# Patient Record
Sex: Female | Born: 1935 | Race: White | Hispanic: No | Smoking: Former smoker
Health system: Southern US, Community
[De-identification: ages and names within clinical notes are randomized; demographics above are authoritative.]

## PROBLEM LIST (undated history)

## (undated) DIAGNOSIS — Z860101 Personal history of adenomatous and serrated colon polyps: Secondary | ICD-10-CM

## (undated) DIAGNOSIS — Z974 Presence of external hearing-aid: Secondary | ICD-10-CM

## (undated) DIAGNOSIS — Z853 Personal history of malignant neoplasm of breast: Secondary | ICD-10-CM

## (undated) DIAGNOSIS — K449 Diaphragmatic hernia without obstruction or gangrene: Secondary | ICD-10-CM

## (undated) DIAGNOSIS — Z9889 Other specified postprocedural states: Secondary | ICD-10-CM

## (undated) DIAGNOSIS — R112 Nausea with vomiting, unspecified: Secondary | ICD-10-CM

## (undated) DIAGNOSIS — Z803 Family history of malignant neoplasm of breast: Secondary | ICD-10-CM

## (undated) DIAGNOSIS — Z800 Family history of malignant neoplasm of digestive organs: Secondary | ICD-10-CM

## (undated) DIAGNOSIS — C50919 Malignant neoplasm of unspecified site of unspecified female breast: Secondary | ICD-10-CM

## (undated) DIAGNOSIS — M1711 Unilateral primary osteoarthritis, right knee: Secondary | ICD-10-CM

## (undated) DIAGNOSIS — K219 Gastro-esophageal reflux disease without esophagitis: Secondary | ICD-10-CM

## (undated) DIAGNOSIS — N819 Female genital prolapse, unspecified: Secondary | ICD-10-CM

## (undated) DIAGNOSIS — Z86010 Personal history of colonic polyps: Secondary | ICD-10-CM

## (undated) DIAGNOSIS — C801 Malignant (primary) neoplasm, unspecified: Secondary | ICD-10-CM

## (undated) DIAGNOSIS — Z8489 Family history of other specified conditions: Secondary | ICD-10-CM

## (undated) DIAGNOSIS — Z9221 Personal history of antineoplastic chemotherapy: Secondary | ICD-10-CM

## (undated) DIAGNOSIS — Z973 Presence of spectacles and contact lenses: Secondary | ICD-10-CM

## (undated) DIAGNOSIS — S32030A Wedge compression fracture of third lumbar vertebra, initial encounter for closed fracture: Secondary | ICD-10-CM

## (undated) DIAGNOSIS — R3989 Other symptoms and signs involving the genitourinary system: Secondary | ICD-10-CM

## (undated) DIAGNOSIS — E785 Hyperlipidemia, unspecified: Secondary | ICD-10-CM

## (undated) DIAGNOSIS — H8110 Benign paroxysmal vertigo, unspecified ear: Secondary | ICD-10-CM

## (undated) HISTORY — DX: Unilateral primary osteoarthritis, right knee: M17.11

## (undated) HISTORY — DX: Family history of malignant neoplasm of digestive organs: Z80.0

## (undated) HISTORY — DX: Malignant neoplasm of unspecified site of unspecified female breast: C50.919

## (undated) HISTORY — PX: COLONOSCOPY: SHX174

## (undated) HISTORY — DX: Family history of malignant neoplasm of breast: Z80.3

## (undated) HISTORY — DX: Gastro-esophageal reflux disease without esophagitis: K21.9

## (undated) HISTORY — DX: Hyperlipidemia, unspecified: E78.5

## (undated) HISTORY — DX: Wedge compression fracture of third lumbar vertebra, initial encounter for closed fracture: S32.030A

---

## 1983-08-09 HISTORY — PX: CHOLECYSTECTOMY: SHX55

## 1990-08-08 DIAGNOSIS — Z9221 Personal history of antineoplastic chemotherapy: Secondary | ICD-10-CM | POA: Insufficient documentation

## 1990-08-08 HISTORY — DX: Personal history of antineoplastic chemotherapy: Z92.21

## 1990-08-08 HISTORY — PX: MASTECTOMY: SHX3

## 1999-12-29 ENCOUNTER — Other Ambulatory Visit: Admission: RE | Admit: 1999-12-29 | Discharge: 1999-12-29 | Payer: Self-pay | Admitting: Obstetrics and Gynecology

## 2000-01-10 ENCOUNTER — Inpatient Hospital Stay (HOSPITAL_COMMUNITY): Admission: RE | Admit: 2000-01-10 | Discharge: 2000-01-12 | Payer: Self-pay | Admitting: Obstetrics and Gynecology

## 2000-01-10 ENCOUNTER — Encounter (INDEPENDENT_AMBULATORY_CARE_PROVIDER_SITE_OTHER): Payer: Self-pay

## 2000-01-10 HISTORY — PX: LAPAROSCOPIC ASSISTED VAGINAL HYSTERECTOMY: SHX5398

## 2000-09-06 ENCOUNTER — Other Ambulatory Visit: Admission: RE | Admit: 2000-09-06 | Discharge: 2000-09-06 | Payer: Self-pay | Admitting: Obstetrics and Gynecology

## 2000-11-22 ENCOUNTER — Ambulatory Visit (HOSPITAL_COMMUNITY): Admission: RE | Admit: 2000-11-22 | Discharge: 2000-11-22 | Payer: Self-pay | Admitting: Oncology

## 2000-11-22 ENCOUNTER — Encounter (HOSPITAL_COMMUNITY): Payer: Self-pay | Admitting: Oncology

## 2001-01-05 ENCOUNTER — Encounter (HOSPITAL_COMMUNITY): Admission: RE | Admit: 2001-01-05 | Discharge: 2001-02-04 | Payer: Self-pay | Admitting: Oncology

## 2001-01-05 ENCOUNTER — Encounter: Admission: RE | Admit: 2001-01-05 | Discharge: 2001-01-05 | Payer: Self-pay | Admitting: Oncology

## 2001-10-10 ENCOUNTER — Other Ambulatory Visit: Admission: RE | Admit: 2001-10-10 | Discharge: 2001-10-10 | Payer: Self-pay | Admitting: Obstetrics and Gynecology

## 2001-11-22 ENCOUNTER — Ambulatory Visit (HOSPITAL_COMMUNITY): Admission: RE | Admit: 2001-11-22 | Discharge: 2001-11-22 | Payer: Self-pay | Admitting: Oncology

## 2001-11-22 ENCOUNTER — Encounter (HOSPITAL_COMMUNITY): Payer: Self-pay | Admitting: Oncology

## 2002-01-09 ENCOUNTER — Encounter (HOSPITAL_COMMUNITY): Admission: RE | Admit: 2002-01-09 | Discharge: 2002-02-08 | Payer: Self-pay | Admitting: Oncology

## 2002-01-09 ENCOUNTER — Encounter: Admission: RE | Admit: 2002-01-09 | Discharge: 2002-01-09 | Payer: Self-pay | Admitting: Oncology

## 2002-10-22 ENCOUNTER — Other Ambulatory Visit: Admission: RE | Admit: 2002-10-22 | Discharge: 2002-10-22 | Payer: Self-pay | Admitting: Obstetrics and Gynecology

## 2002-12-13 ENCOUNTER — Encounter (HOSPITAL_COMMUNITY): Payer: Self-pay | Admitting: Oncology

## 2002-12-13 ENCOUNTER — Ambulatory Visit (HOSPITAL_COMMUNITY): Admission: RE | Admit: 2002-12-13 | Discharge: 2002-12-13 | Payer: Self-pay | Admitting: Oncology

## 2003-01-10 ENCOUNTER — Encounter (HOSPITAL_COMMUNITY): Admission: RE | Admit: 2003-01-10 | Discharge: 2003-02-09 | Payer: Self-pay | Admitting: Oncology

## 2003-01-10 ENCOUNTER — Encounter: Admission: RE | Admit: 2003-01-10 | Discharge: 2003-01-10 | Payer: Self-pay | Admitting: Oncology

## 2003-12-17 ENCOUNTER — Encounter (HOSPITAL_COMMUNITY): Admission: RE | Admit: 2003-12-17 | Discharge: 2004-01-16 | Payer: Self-pay | Admitting: Oncology

## 2004-01-07 ENCOUNTER — Other Ambulatory Visit: Admission: RE | Admit: 2004-01-07 | Discharge: 2004-01-07 | Payer: Self-pay | Admitting: Obstetrics and Gynecology

## 2004-02-03 ENCOUNTER — Encounter: Admission: RE | Admit: 2004-02-03 | Discharge: 2004-02-03 | Payer: Self-pay | Admitting: Oncology

## 2004-02-03 ENCOUNTER — Encounter (HOSPITAL_COMMUNITY): Admission: RE | Admit: 2004-02-03 | Discharge: 2004-03-04 | Payer: Self-pay | Admitting: Oncology

## 2005-01-10 ENCOUNTER — Encounter: Admission: RE | Admit: 2005-01-10 | Discharge: 2005-01-10 | Payer: Self-pay | Admitting: Oncology

## 2005-01-10 ENCOUNTER — Encounter (HOSPITAL_COMMUNITY): Admission: RE | Admit: 2005-01-10 | Discharge: 2005-02-09 | Payer: Self-pay | Admitting: Oncology

## 2005-01-20 ENCOUNTER — Other Ambulatory Visit: Admission: RE | Admit: 2005-01-20 | Discharge: 2005-01-20 | Payer: Self-pay | Admitting: Obstetrics and Gynecology

## 2005-02-25 ENCOUNTER — Encounter: Admission: RE | Admit: 2005-02-25 | Discharge: 2005-02-25 | Payer: Self-pay | Admitting: Oncology

## 2005-02-25 ENCOUNTER — Ambulatory Visit (HOSPITAL_COMMUNITY): Payer: Self-pay | Admitting: Oncology

## 2005-02-25 ENCOUNTER — Encounter (HOSPITAL_COMMUNITY): Admission: RE | Admit: 2005-02-25 | Discharge: 2005-03-27 | Payer: Self-pay | Admitting: Oncology

## 2005-05-04 ENCOUNTER — Ambulatory Visit: Payer: Self-pay | Admitting: Internal Medicine

## 2005-05-16 ENCOUNTER — Ambulatory Visit: Payer: Self-pay | Admitting: Cardiology

## 2005-05-18 ENCOUNTER — Ambulatory Visit: Payer: Self-pay | Admitting: Internal Medicine

## 2005-06-03 ENCOUNTER — Ambulatory Visit: Payer: Self-pay | Admitting: Internal Medicine

## 2005-06-23 ENCOUNTER — Ambulatory Visit: Payer: Self-pay | Admitting: Internal Medicine

## 2005-07-07 ENCOUNTER — Ambulatory Visit: Payer: Self-pay | Admitting: Internal Medicine

## 2005-07-27 ENCOUNTER — Ambulatory Visit (HOSPITAL_COMMUNITY): Admission: RE | Admit: 2005-07-27 | Discharge: 2005-07-27 | Payer: Self-pay | Admitting: Internal Medicine

## 2005-07-27 ENCOUNTER — Ambulatory Visit: Payer: Self-pay | Admitting: Internal Medicine

## 2005-09-06 ENCOUNTER — Ambulatory Visit: Payer: Self-pay | Admitting: Internal Medicine

## 2006-01-12 ENCOUNTER — Encounter (HOSPITAL_COMMUNITY): Admission: RE | Admit: 2006-01-12 | Discharge: 2006-02-11 | Payer: Self-pay | Admitting: Oncology

## 2006-01-12 ENCOUNTER — Encounter: Admission: RE | Admit: 2006-01-12 | Discharge: 2006-01-12 | Payer: Self-pay | Admitting: Oncology

## 2006-02-01 ENCOUNTER — Other Ambulatory Visit: Admission: RE | Admit: 2006-02-01 | Discharge: 2006-02-01 | Payer: Self-pay | Admitting: Obstetrics and Gynecology

## 2006-02-24 ENCOUNTER — Encounter: Admission: RE | Admit: 2006-02-24 | Discharge: 2006-02-24 | Payer: Self-pay | Admitting: Oncology

## 2006-02-24 ENCOUNTER — Ambulatory Visit (HOSPITAL_COMMUNITY): Payer: Self-pay | Admitting: Oncology

## 2006-02-24 ENCOUNTER — Encounter (HOSPITAL_COMMUNITY): Admission: RE | Admit: 2006-02-24 | Discharge: 2006-03-26 | Payer: Self-pay | Admitting: Oncology

## 2007-02-12 ENCOUNTER — Encounter (HOSPITAL_COMMUNITY): Admission: RE | Admit: 2007-02-12 | Discharge: 2007-03-14 | Payer: Self-pay | Admitting: Oncology

## 2007-02-26 ENCOUNTER — Ambulatory Visit (HOSPITAL_COMMUNITY): Payer: Self-pay | Admitting: Oncology

## 2007-08-09 LAB — HM COLONOSCOPY

## 2008-02-21 ENCOUNTER — Encounter (HOSPITAL_COMMUNITY): Admission: RE | Admit: 2008-02-21 | Discharge: 2008-03-22 | Payer: Self-pay | Admitting: Oncology

## 2008-03-26 ENCOUNTER — Ambulatory Visit (HOSPITAL_COMMUNITY): Payer: Self-pay | Admitting: Oncology

## 2008-03-26 ENCOUNTER — Encounter (HOSPITAL_COMMUNITY): Admission: RE | Admit: 2008-03-26 | Discharge: 2008-04-25 | Payer: Self-pay | Admitting: Oncology

## 2009-03-09 ENCOUNTER — Ambulatory Visit (HOSPITAL_COMMUNITY): Admission: RE | Admit: 2009-03-09 | Discharge: 2009-03-09 | Payer: Self-pay | Admitting: Oncology

## 2009-03-25 ENCOUNTER — Ambulatory Visit (HOSPITAL_COMMUNITY): Payer: Self-pay | Admitting: Oncology

## 2009-08-31 HISTORY — PX: ESOPHAGOGASTRODUODENOSCOPY: SHX1529

## 2010-03-16 ENCOUNTER — Ambulatory Visit (HOSPITAL_COMMUNITY): Admission: RE | Admit: 2010-03-16 | Discharge: 2010-03-16 | Payer: Self-pay | Admitting: Internal Medicine

## 2010-03-24 ENCOUNTER — Ambulatory Visit (HOSPITAL_COMMUNITY): Admission: RE | Admit: 2010-03-24 | Discharge: 2010-03-24 | Payer: Self-pay | Admitting: Internal Medicine

## 2010-08-08 LAB — HM DEXA SCAN

## 2010-08-29 ENCOUNTER — Encounter (HOSPITAL_COMMUNITY): Payer: Self-pay | Admitting: Oncology

## 2010-08-30 ENCOUNTER — Encounter (HOSPITAL_COMMUNITY): Payer: Self-pay | Admitting: Oncology

## 2010-12-24 NOTE — Op Note (Signed)
[facility]  Patient:    Rebecca Guzman, Rebecca Guzman               MRN: [id number] Proc. Date: 01/10/00 Adm. Date:  [id number] Attending:  Silverio Lay A                           Operative Report  PREOPERATIVE DIAGNOSIS:  Uterine prolapse, cystocele, status post breast cancer.  POSTOPERATIVE DIAGNOSIS:  Uterine prolapse, cystocele, rectocele, status post breast cancer.  OPERATION PERFORMED:  Laparoscopically assisted vaginal hysterectomy with bilateral salpingo-oophorectomy and posterior repair.  SURGEON:  Silverio Lay, M.D.  ASSISTANT:  Pershing Cox, M.D.  ANESTHESIA:  General.  ESTIMATED BLOOD LOSS:  300 cc.  Other operative report dictated by Dr. Patsi Sears who proceeded with anterior repair and pubovaginal sling.  DESCRIPTION OF PROCEDURE:  After being informed of the procedure and possible complications including bleeding, infection, trauma to bowel, bladder, ureters and need for laparotomy, informed consent was obtained.  The patient was taken to the operating room #12, given general anesthesia with endotracheal intubation.  She was placed in a lithotomy position, prepped and draped in a sterile fashion and a Foley catheter was inserted.  The anterior lip of the cervix was grasped with a tenaculum and an intrauterine manipulator was placed.  A 10 mm incision was made at the umbilical area for insertion of a Veress needle and insufflation of pneumoperitoneum with 15 mmHg pressure of CO2.  The Veress needle was removed.  10-12 mm trocar was inserted and the laparoscope was then inserted.  Two 5 mm trocar was placed on both lower quadrants under direct visualization.  OBSERVATION:  The uterus is normal size, normal appearance.  Both tubes and both ovaries were normal.  There is no evidence of any pelvic disease.  There were no adhesions.  We proceeded with using the tripolar with cauterization of both round ligaments and section.   With ureters always under direct visualization, we could proceed with cauterization and section of both infundibulopelvic ligaments.  Broad ligament was then incised with an End[employer] and bladder was dissected bluntly.  Both ureters were visualized again and normal.  There was no active bleeding site and we proceeded with the vaginal time and vaginal hysterectomy.  A weighted speculum was inserted.  The cervix was grasped with two tenaculum.  The vaginal mucosa was infiltrated with Marcaine 0.25%, epinephrine 10 cc in a circumferential way and was incised with knife in a circumferential way.  Both anterior and posterior cul-de-sac were dissected bluntly.  The posterior cul-de-sac was entered with scissors which allowed [place] to clamp both uterosacral ligaments with Rogers clamp, section those ligaments and suture them with a transfix suture of 0 Vicryl, kept for further suspension.  The anterior cul-de-sac was difficult to dissect so we proceeded with clamping cardinal ligaments on both sides with Rogers clamps sectioning those ligaments and suturing them with transfix suture of 0 Vicryl.  The anterior cul-de-sac was then entered.  Bladder was retracted upwards.  Uterine arteries were clamped with two Rogers clamps, sectioned and sutured with 0 Vicryl.  Broad ligament on each side was then clamped with Rogers clamp, sectioned and sutured with transfix suture of 0 Vicryl removing uterus with both tubes and both ovaries.  Hemostasis was completed on both uterosacral ligaments with a figure-of-eight stitch of 0 Vicryl.  The posterior lip of the vagina was sutured with running locked suture of 0 Vicryl suspending each angle  to the corresponding uterosacral ligament.  We then proceeded with plicature of the posterior cul-de-sac using a 0 Vicryl suture joining both uterosacral and posterior cul-de-sac.  Vagina was then closed in a longitudinal way with figure-of-eight stitch of 0 Vicryl.  We then  proceded with a posterior repair excising part of the perineal skin after infiltrating with Marcaine 0.5 with epinephrine 8 cc.  This allowed [place] to dissect the posterior vaginal wall from the prerectal fascia with Metzenbaum scissors up to about 2 cm from the vaginal tip.  We then sharply and bluntly dissected the prerectal fascia from the posterior vaginal mucosa which allowed [place] to correct the rectocele in two layers using U-stitches of 0 Vicryl.  After complete correction of the rectocele, the excess vaginal mucosa was excised and posterior vaginal mucosa was closed with a running locked suture of 0 Vicryl. The perineal muscles were then closed with two simple sutures of 0 Vicryl, perineal skin was closed with subcutaneous sutures of 3-0 chromic.  We went back to laparoscopy time, restarted pneumoperitoneum and looked at the hemostasis of both IP ligaments, all pedicles and posterior bladder. Hemostasis was adequate.  Both ureters were seen with good peristalsis.  We removed all trocars after evacuating pneumoperitoneum and closed the skin of every trocar incision with subcutaneous sutures of 4-0 Monocryl.  Instruments and sponge count was complete x 2.  Estimated blood loss for this procedure was 300 cc.  The patient tolerated the procedure well.  She was given to the care of Dr. Patsi Sears to proceed with anterior repair and pubovaginal sling. DD:  01/10/00 TD:  01/12/00 Job: [id number]

## 2010-12-24 NOTE — Consult Note (Signed)
[facility]  Patient:    Rebecca Guzman, Rebecca Guzman               MRN: [id number] Proc. Date: 01/10/00 Adm. Date:  [id number] Disc. Date: [id number] Attending:  Silverio Lay A                          Consultation Report  REASON FOR ADMISSION:  Uterine prolapse and cystocele.  HISTORY OF PRESENT ILLNESS:  This is a 77 year old widowed white female, gravida 4, para 4, abortus 0, who has been menopause for 12 years and has been complaining of symptoms of uterine prolapse for the last three to four years. She was fitted for a pessary in March 2000, but was not satisfied with this method.  She is now seeking corrective surgery.  She is also status post breast cancer and is requesting prophylactic bilateral salpingo-oophorectomy.  HOSPITAL COURSE:  She underwent surgery on January 10, 2000, with laparoscopic-assisted vaginal hysterectomy and bilateral salpingo-oophorectomy as well as posterior repair.  She also underwent anterior repair and pubovaginal sling procedure by Dr. Patsi Sears.  Her operation went without complication, and her estimated blood loss was 300 cc.  Her postoperative course was uneventful, and she was discharged on postoperative day #2, in a well and stable condition, with a hemoglobin of 11.5.  DISCHARGE DIAGNOSES: 1. Uterine prolapse. 2. Cystocele. 3. Rectocele.  CONDITION ON DISCHARGE:  Well and stable. DD:  01/12/00 TD:  01/19/00 Job: [id number]

## 2010-12-24 NOTE — H&P (Signed)
[facility]  Patient:    Rebecca Guzman, Rebecca Guzman                        MRN: [id number] Adm. Date:  01/10/00 Attending:  Silverio Lay, M.D.                         History and Physical  DATE OF BIRTH:  1933/12/22  REASON FOR ADMISSION:  Uterine prolapse, status post breast cancer in 1992.  HISTORY OF PRESENT ILLNESS:  This is a 77 year old widowed white female, gravida 4, para 4, aborted 0, who has been menopause for the last 12 years, wanting a second opinion on uterine prolapse.  She was first evaluated in the office in December  2000, and was complaining for the last three to four years of inability to void due to a prolapsed uterus, with abdominal pain.  She also complained of an incomplete emptying of the bladder, and feeling a bulging out of the vagina.  The symptoms  have been progressively increasing over the last four years.  She does not report any difficulty with bowel movements.  The patient was fitted for a pessary in March 2000, but was unhappy with the use of a pessary, which was triggering occasional pinkish discharge.  She is not sexually active.  A Pap smear in March 2000, was  reported normal.  PAST MEDICAL HISTORY:  Mild asthma with rescue inhalers p.r.n.   No history of eep vein thrombosis or coagulopathy.  ALLERGIES:  MORPHINE AND CODEINE.  PAST SURGICAL HISTORY: 1. A left breast mastectomy with chemotherapy in April 1992. 2. Status post a cholecystectomy.  CURRENT MEDICATIONS: 1. Protonix for gastroesophageal reflux disease. 2. Calcium supplement.  SOCIAL HISTORY:  Widowed, nonsmoker.  Retired Runner, broadcasting/film/video.  FAMILY HISTORY:  Mother deceased of a myocardial infarction.  Was known for hypertension and colon cancer, at age 3.  Father deceased of heart disease.  A  sister with hypertension.  A half-sister deceased of breast cancer in her 68s.  REVIEW OF SYSTEMS:  CONSTITUTIONAL:  Negative.  EYES:  Visual problem  with corrective lenses.  EARS/NOSE/THROAT:  Negative.  CARDIOVASCULAR:  Negative. RESPIRATORY:  Occasional coughing spells.  GASTROINTESTINAL:  Occasional constipation and heartburn.  GENITOURINARY:  Negative.  MUSCULOSKELETAL: Negative. SKIN/BREASTS:  Negative.  Last mammogram in March 2000, normal.  NEUROLOGICAL:  Negative.  PSYCHIATRIC:  Negative.  ENDOCRINE:  Negative.  PHYSICAL EXAMINATION:  VITAL SIGNS:  Normal with a blood pressure of 112/74.  Weight 121 pounds, height 5 feet 7 inches.  GENERAL:  Well-developed, well-nourished, well-groomed female.  NECK:  Negative.  LUNGS:  Negative.  CARDIOVASCULAR:  Negative.  ABDOMEN:  Normal.  Absence of a hernia or hepatosplenomegaly.  NODES:  Lymph nodes negative in the neck, axillae, and groin areas.  SKIN:  Negative.  NEUROLOGIC:  The patient is well-oriented in time, place, and person, with a normal mood and affect.  BREASTS:  Normal.  PELVIC:  Reveals poor pelvic support with a 4/4 uterine prolapse, with an anteverted normal volume uterus.  Both adnexa are felt normal.  Positive urethrocele 3/4, with negative stress test.  Positive cystocele 3/4.  No rectocele or enterocele.  The patient was seen in preoperative consultation by Dr. Lynelle Smoke I. Patsi Sears, who suggests at the time of surgery a placement of a tubovaginal sling with anterior repair, to avoid postoperative incontinence.  ASSESSMENT:  Symptomatic uterine prolapse with a cystocele.  PLAN:  The patient will undergo a laparoscopic-assisted vaginal hysterectomy because of requested prophylactic removal of both ovaries, being known for previous breast cancer in 1992.  Dr. Patsi Sears will proceed at the same time with a pubovaginal sling and anterior repair, and the patient will be evaluated during  surgery for the need of a posterior repair.  All of the procedures were reviewed with the patient, with possible complications, including bleeding,  infection, trauma to the other organs, bowels, bladder, ureters, and the need for a laparotomy.  The hospital stay and recovery were reviewed.  The patient voiced her understanding and a consent was obtained.DD:  01/07/00 TD:  01/07/00 Job: [id number]

## 2010-12-24 NOTE — Discharge Summary (Signed)
[facility]  Patient:    MANUEL, LAWHEAD               MRN: [id number] Adm. Date:  [id number] Disc. Date: [id number] Attending:  Silverio Lay A                           Discharge Summary  REASON FOR ADMISSION:  Uterine prolapse and status post breast cancer in 1992.  HISTORY OF PRESENT ILLNESS:  This is a 77 year old widowed white female, who has been complaining for the last three to four years of inability to void due to uterine prolapse with some abdominal discomfort.  The patient had previously tried treatment with pessary but was unhappy with this treatment. She was admitted to undergo corrective surgery.  She underwent laparoscopic-assisted vaginal hysterectomy with bilateral salpingo-oophorectomy and posterior repair on January 10, 2000, as well as pubovaginal sling by Dr. Patsi Sears.  Both procedures went without complication with an estimated blood loss overall of 300 cc.  Her postoperative course was uneventful and the patient was able to void on postop day #2 without any difficulty.  She was discharged home on January 12, 2000, or postoperative day #2 with a postoperative hemoglobin of 11.5 in a well and stable condition.  She was instructed to call if experiencing increased pain, bleeding or fever.  She was given Demerol 50 mg q.4-6h. p.r.n. and Motrin 600 mg q.i.d. p.r.n. for pain control.  She is to be followed in the office in four to six weeks.  FINAL DIAGNOSES:  Uterine prolapse with cystocele and rectocele.  CONDITION AT DISCHARGE:  Well and stable. DD:  01/20/00 TD:  01/25/00 Job: [id number]

## 2010-12-24 NOTE — Op Note (Signed)
[facility]  Patient:    Rebecca Guzman, Rebecca Guzman               MRN: [id number] Proc. Date: 01/10/00 Adm. Date:  [id number] Attending:  Esmeralda Arthur CC:         Silverio Lay, M.D.                           Operative Report  PREOPERATIVE DIAGNOSIS:  Pelvic prolapse.  POSTOPERATIVE DIAGNOSIS:  Pelvic prolapse.  OPERATION PERFORMED:  Anterior vaginal vault repair with pubovaginal sling. SURGEON:  Sigmund I. Patsi Sears, M.D.  ANESTHESIA:  General endotracheal.  DESCRIPTION OF PROCEDURE:  With the patient in dorsal lithotomy position, she was repositioned in the modified lithotomy position, and the posterior weighted speculum was placed.  The patient had previously undergone laparoscopic oophorectomy as well as vaginal hysterectomy and posterior repair.  The anterior vaginal wall was injected with 20 cc of Marcaine 0.5% with epinephrine 1:200,000, and following this, a semilunar incision was made from the 8 oclock to the 12 oclock to the 4 oclock positions.  Subcutaneous tissue was sharply and bluntly dissected.  Minimal bleeding was noted. Dissection was carried into the retropubic space, and two retropubic anchors were placed with #1 Prolene sutures attached.  Following this, a previously outlined 8 cm by 6 cm portion of fascia was placed in retrograde fashion. This accomplished an excellent support for the urethra.  The anterior repair was effected with 3-0 horizontal mattress 3-0 Vicryls and the vaginal epithelium was closed with two separate inverted buried 3-0 Vicryl sutures. Packing was placed.  Cystoscopy was accomplished and there was no suture within the bladder.  The patient was then awakened and taken to the recovery room in good condition. DD:  01/10/00 TD:  01/12/00 Job: [id number]

## 2011-01-06 ENCOUNTER — Other Ambulatory Visit (HOSPITAL_COMMUNITY): Payer: Self-pay | Admitting: Internal Medicine

## 2011-01-06 DIAGNOSIS — Z139 Encounter for screening, unspecified: Secondary | ICD-10-CM

## 2011-03-21 ENCOUNTER — Ambulatory Visit (HOSPITAL_COMMUNITY)
Admission: RE | Admit: 2011-03-21 | Discharge: 2011-03-21 | Disposition: A | Discharge status: Home / Self Care | Payer: Medicare Other | Source: Ambulatory Visit | Attending: Internal Medicine | Admitting: Internal Medicine

## 2011-03-21 DIAGNOSIS — Z1231 Encounter for screening mammogram for malignant neoplasm of breast: Secondary | ICD-10-CM | POA: Insufficient documentation

## 2011-03-21 DIAGNOSIS — Z139 Encounter for screening, unspecified: Secondary | ICD-10-CM

## 2011-08-09 LAB — HM MAMMOGRAPHY

## 2011-10-03 ENCOUNTER — Encounter: Payer: Self-pay | Admitting: Gastroenterology

## 2011-10-12 ENCOUNTER — Ambulatory Visit (INDEPENDENT_AMBULATORY_CARE_PROVIDER_SITE_OTHER): Payer: Medicare Other | Admitting: Gastroenterology

## 2011-10-12 ENCOUNTER — Encounter: Payer: Self-pay | Admitting: Gastroenterology

## 2011-10-12 VITALS — BP 142/80 | HR 68 | Ht 65.0 in | Wt 175.0 lb

## 2011-10-12 DIAGNOSIS — R110 Nausea: Secondary | ICD-10-CM

## 2011-10-12 DIAGNOSIS — K219 Gastro-esophageal reflux disease without esophagitis: Secondary | ICD-10-CM | POA: Insufficient documentation

## 2011-10-12 DIAGNOSIS — Z800 Family history of malignant neoplasm of digestive organs: Secondary | ICD-10-CM

## 2011-10-12 DIAGNOSIS — Z86010 Personal history of colonic polyps: Secondary | ICD-10-CM

## 2011-10-12 MED ORDER — RABEPRAZOLE SODIUM 20 MG PO TBEC: 20.0000 mg | DELAYED_RELEASE_TABLET | Freq: Two times a day (BID) | ORAL | Status: DC

## 2011-10-12 NOTE — Patient Instructions (Addendum)
Stop taking Prevacid and start taking Aciphex samples one tablet by mouth twice daily.  Please call us back after finishing samples and let us know which medication you would prefer to take.  Patient advised to avoid spicy, acidic, citrus, chocolate, mints, fruit and fruit juices.  Limit the intake of caffeine, alcohol and Soda.  Don't exercise too soon after eating.  Don't lie down within 3-4 hours of eating.  Elevate the head of your bed. Cc: Lanier Clam, MD

## 2011-10-12 NOTE — Progress Notes (Signed)
History of Present Illness: This is a 78 year old female who relates a several year history of acid reflux symptoms and recent problems with worsening nausea. She underwent colonoscopy for polyp followup and a family history of colon cancer in January 2009 by Dr. Cleotis Nipper revealing a tortuous and redundant colon and was otherwise normal. She states she underwent upper endoscopy by Dr. Karilyn Cota in 2011 that showed H. pylori gastritis and otherwise unremarkable. She was initially maintained on Prilosec but change to Prevacid for symptom control. Over the past few months she has noted worsening problems with nausea and anorexia. She feels the nausea is brought on by Prevacid as follows the Prevacid dose by several hours. She was tried on Dexilant which worsened her nausea. She has frequent breakthrough reflux symptoms. She states she's lost about 5-6 pounds. Blood work from Dr. Margaretmary Eddy office in October 2012 was unremarkable. Her symptoms started around the time she was taking Advil and diclofenac in November 2012. Denies abdominal pain, constipation, diarrhea, change in stool caliber, melena, hematochezia, vomiting, dysphagia, chest pain.  Allergies  Allergen Reactions  . Codeine   . Morphine And Related     Nausea, and drop in blood pressure   No outpatient prescriptions prior to visit.   Past Medical History  Diagnosis Date  . GERD (gastroesophageal reflux disease)   . Uterine prolapse   . Breast cancer 1992  . Asthma   . Arthritis   . Colon polyps     Dr. Cleotis Nipper in [place]  . Gallstones   . Elevated cholesterol    Past Surgical History  Procedure Date  . Pelvic floor repair   . Laparoscopic assisted vaginal hysterectomy   . Cholecystectomy   . Mastectomy 1992    Left   History   Social History  . Marital Status: Widowed    Spouse Name: N/A    Number of Children: 4  . Years of Education: N/A   Occupational History  . Retired Runner, broadcasting/film/video    Social History Main Topics  . Smoking  status: Former Smoker    Types: Cigarettes    Quit date: 10/12/1983  . Smokeless tobacco: Never Used  . Alcohol Use: No  . Drug Use: No  . Sexually Active: None   Other Topics Concern  . None   Social History Narrative  . None   Family History  Problem Relation Age of Onset  . Heart attack Mother   . Colon cancer Mother   . Hypertension Mother   . Heart disease Father   . Breast cancer Sister    Review of Systems: Allergies, anxiety, back pain, vision changes, hearing problems. Additional pertinent positive and negative review of systems were noted in the above HPI section. All other review of systems were otherwise negative.  Physical Exam: General: Well developed , well nourished, no acute distress Head: Normocephalic and atraumatic Eyes:  sclerae anicteric, EOMI Ears: Normal auditory acuity Mouth: No deformity or lesions Neck: Supple, no masses or thyromegaly Lungs: Clear throughout to auscultation Heart: Regular rate and rhythm; no murmurs, rubs or bruits Abdomen: Soft, non tender and non distended. No masses, hepatosplenomegaly or hernias noted. Normal Bowel sounds Musculoskeletal: Symmetrical with no gross deformities  Skin: No lesions on visible extremities Pulses:  Normal pulses noted Extremities: No clubbing, cyanosis, edema or deformities noted Neurological: Alert oriented x 4, grossly nonfocal Cervical Nodes:  No significant cervical adenopathy Inguinal Nodes: No significant inguinal adenopathy Psychological:  Alert and cooperative. Normal mood and affect  Assessment and  Recommendations:  1. GERD, anorexia, weight loss and nausea. She may be experiencing Prevacid side effects or symptoms from inadequate control of her reflux disease. Intensify all anti-reflux measures. Begin AcipHex 20 mg twice a day. Attempt to obtain records from prior endoscopy. If symptoms do not respond consider upper endoscopy and imaging studies for further evaluation. Return office  visit in one month.  2. Family history of colon cancer and personal history of colon polyps. Attempt to obtain prior polyp records. Surveillance colonoscopy recommended January 2014.  3. History of H. pylori gastritis

## 2011-10-17 ENCOUNTER — Telehealth: Payer: Self-pay | Admitting: Gastroenterology

## 2011-10-17 MED ORDER — RABEPRAZOLE SODIUM 20 MG PO TBEC: 20.0000 mg | DELAYED_RELEASE_TABLET | Freq: Two times a day (BID) | ORAL | Status: DC

## 2011-10-17 NOTE — Telephone Encounter (Signed)
Prescription sent to the pharmacy.

## 2011-11-09 ENCOUNTER — Ambulatory Visit (INDEPENDENT_AMBULATORY_CARE_PROVIDER_SITE_OTHER): Payer: Medicare Other | Admitting: Gastroenterology

## 2011-11-09 ENCOUNTER — Encounter: Payer: Self-pay | Admitting: Gastroenterology

## 2011-11-09 VITALS — BP 128/64 | HR 82 | Ht 65.0 in | Wt 175.0 lb

## 2011-11-09 DIAGNOSIS — K219 Gastro-esophageal reflux disease without esophagitis: Secondary | ICD-10-CM

## 2011-11-09 NOTE — Patient Instructions (Addendum)
Please continue current medications. Please follow up with Dr. Russella Dar in one year or as needed.  cc: Kirstie Peri, MD

## 2011-11-09 NOTE — Progress Notes (Signed)
History of Present Illness: This is a 78 year old female returning for followup of GERD and nausea. Her symptoms are under very good control on AcipHex 20 mg twice daily and antireflux measures. She states she still has intermittent difficulties with nausea in the morning around breakfast time. EGD report from 2011 by Dr. Karilyn Cota was reviewed showed a small sliding hiatal hernia and nonerosive antral gastritis. It was otherwise normal. Denies weight loss, abdominal pain, constipation, diarrhea, change in stool caliber, melena, hematochezia, vomiting, dysphagia, chest pain.  Current Medications, Allergies, Past Medical History, Past Surgical History, Family History and Social History were reviewed in [employer] record.  Physical Exam: General: Well developed , well nourished, no acute distress Head: Normocephalic and atraumatic Eyes:  sclerae anicteric, EOMI Ears: Normal auditory acuity Mouth: No deformity or lesions Lungs: Clear throughout to auscultation Heart: Regular rate and rhythm; no murmurs, rubs or bruits Abdomen: Soft, non tender and non distended. No masses, hepatosplenomegaly or hernias noted. Normal Bowel sounds Musculoskeletal: Symmetrical with no gross deformities  Extremities: No clubbing, cyanosis, edema or deformities noted Neurological: Alert oriented x 4, grossly nonfocal Psychological:  Alert and cooperative. Normal mood and affect  Assessment and Recommendations:  1. GERD. Continue AcipHex 20 mg twice daily. She may adjust the evening dose of AcipHex to a later time to determine if this will help with her morning symptoms. She will discuss with her pharmacist whether there are other PPIs to be more affordable. I would like to avoid Prevacid that she may have had side effects. I advised her we could try omeprazole 40 mg twice daily and she will notify us if she would like to make a change.

## 2012-02-07 ENCOUNTER — Telehealth: Payer: Self-pay | Admitting: Gastroenterology

## 2012-02-07 MED ORDER — OMEPRAZOLE 40 MG PO CPDR: 40.0000 mg | DELAYED_RELEASE_CAPSULE | Freq: Two times a day (BID) | ORAL | Status: DC

## 2012-02-07 NOTE — Telephone Encounter (Signed)
Per office note 11/09/11 Dr. Russella Dar advised that patient could try omeprazole 40 BID if she wishes. Patient is having nausea and she feels it may be from aciphex.  I will call in a new Rx and she will call back for any additional concerns or problems.

## 2012-02-13 ENCOUNTER — Telehealth: Payer: Self-pay | Admitting: Gastroenterology

## 2012-02-13 NOTE — Telephone Encounter (Signed)
Patient states she has tried the omeprazole for two days and starting having the same symptoms as she did when taking Aciphex. Pt has constant nausea but her reflux is under control. Pt stopped the omeprazole and has been taking Pepcid for the last several days which has controlled her symptoms of reflux and nausea. Patient thinks that he higher dose of the medicine is making her feel this way. Told patient to take a lower dose of omeprazole 20 mg one tablet by mouth twice daily to see if this also gives her the same reaction. Patient states she will get some OTC Prilosec to take twice daily and call back if her nausea returns while taking this medication. Pt agreed and verbalized understanding.

## 2012-02-14 ENCOUNTER — Other Ambulatory Visit (HOSPITAL_COMMUNITY): Payer: Self-pay | Admitting: Internal Medicine

## 2012-02-14 DIAGNOSIS — Z139 Encounter for screening, unspecified: Secondary | ICD-10-CM

## 2012-03-22 ENCOUNTER — Ambulatory Visit (HOSPITAL_COMMUNITY)
Admission: RE | Admit: 2012-03-22 | Discharge: 2012-03-22 | Disposition: A | Discharge status: Home / Self Care | Payer: Medicare Other | Source: Ambulatory Visit | Attending: Internal Medicine | Admitting: Internal Medicine

## 2012-03-22 DIAGNOSIS — Z139 Encounter for screening, unspecified: Secondary | ICD-10-CM

## 2012-03-22 DIAGNOSIS — Z1231 Encounter for screening mammogram for malignant neoplasm of breast: Secondary | ICD-10-CM | POA: Insufficient documentation

## 2012-04-13 ENCOUNTER — Ambulatory Visit: Payer: Self-pay | Admitting: Obstetrics and Gynecology

## 2012-05-31 ENCOUNTER — Ambulatory Visit: Payer: Self-pay | Admitting: Obstetrics and Gynecology

## 2012-06-28 ENCOUNTER — Encounter: Payer: Self-pay | Admitting: Obstetrics and Gynecology

## 2012-06-28 ENCOUNTER — Ambulatory Visit (INDEPENDENT_AMBULATORY_CARE_PROVIDER_SITE_OTHER): Payer: Medicare Other | Admitting: Obstetrics and Gynecology

## 2012-06-28 VITALS — BP 122/72 | Ht 64.5 in | Wt 178.0 lb

## 2012-06-28 DIAGNOSIS — Z124 Encounter for screening for malignant neoplasm of cervix: Secondary | ICD-10-CM

## 2012-06-28 DIAGNOSIS — Z01419 Encounter for gynecological examination (general) (routine) without abnormal findings: Secondary | ICD-10-CM

## 2012-06-28 DIAGNOSIS — N815 Vaginal enterocele: Secondary | ICD-10-CM

## 2012-06-28 NOTE — Progress Notes (Signed)
The patient is not taking hormone replacement therapy The patient  is  taking a Calcium supplement. Post-menopausal bleeding:No  Last Pap:02/16/2007 Normal Last mammogram: 03/23/2012 Normal Last DEXA scan : 06/04/2012 with [facility].  Last colonoscopy:2009 Normal  Urinary symptoms: none Normal bowel movements: Yes Reports abuse at home: No:    Subjective:    Rebecca Guzman is a 78 y.o. female G4P4 who presents for annual exam. S/p LAVH/BSO and known colpocele The patient has no complaints today.   The following portions of the patient's history were reviewed and updated as appropriate: allergies, current medications, past family history, past medical history, past social history, past surgical history and problem list.  Review of Systems Pertinent items are noted in HPI. Gastrointestinal:No change in bowel habits, no abdominal pain, no rectal bleeding Genitourinary:negative for dysuria, frequency, hematuria, nocturia and urinary incontinence    Objective:     Ht 5' 4.5" (1.638 m)  Wt 178 lb (80.74 kg)  BMI 30.08 kg/m2  LMP 11/21/1985  Weight:  Wt Readings from Last 1 Encounters:  06/28/12 178 lb (80.74 kg)     BMI: Body mass index is 30.08 kg/(m^2). General Appearance: Alert, appropriate appearance for age. No acute distress HEENT: Grossly normal Neck / Thyroid: Supple, no masses, nodes or enlargement Lungs: clear to auscultation bilaterally Back: No CVA tenderness Breast Exam: No masses or nodes.No dimpling, nipple retraction or discharge. Cardiovascular: Regular rate and rhythm. S1, S2, no murmur Gastrointestinal: Soft, non-tender, no masses or organomegaly Pelvic Exam: Vulva and vagina appear normal. Bimanual exam reveals normal adnexa.Uterus is surgically absent. Complete colpocele Rectovaginal: normal rectal, no masses Lymphatic Exam: Non-palpable nodes in neck, clavicular, axillary, or inguinal regions Skin: no rash or abnormalities Neurologic: Normal gait  and speech, no tremor  Psychiatric: Alert and oriented, appropriate affect.      Assessment:     Asymptomatic complete colpocele: pt may consider surgery next year after she moves to [place]. Pt informed that I would refer to Dr Seymour Bars for surgery.   Plan:   mammogram return annually or prn Vaginal Prolapse discussed    Silverio Lay MD

## 2012-08-08 HISTORY — PX: CATARACT EXTRACTION W/ INTRAOCULAR LENS  IMPLANT, BILATERAL: SHX1307

## 2013-02-15 ENCOUNTER — Other Ambulatory Visit: Payer: Self-pay

## 2013-02-15 MED ORDER — OMEPRAZOLE 10 MG PO CPDR: 10.0000 mg | DELAYED_RELEASE_CAPSULE | Freq: Two times a day (BID) | ORAL | Status: DC

## 2013-03-01 ENCOUNTER — Encounter: Payer: Self-pay | Admitting: Gastroenterology

## 2013-03-01 ENCOUNTER — Ambulatory Visit (INDEPENDENT_AMBULATORY_CARE_PROVIDER_SITE_OTHER): Payer: Medicare Other | Admitting: Gastroenterology

## 2013-03-01 VITALS — BP 114/66 | HR 92 | Ht 64.5 in | Wt 174.1 lb

## 2013-03-01 DIAGNOSIS — Z86010 Personal history of colonic polyps: Secondary | ICD-10-CM

## 2013-03-01 DIAGNOSIS — K219 Gastro-esophageal reflux disease without esophagitis: Secondary | ICD-10-CM

## 2013-03-01 DIAGNOSIS — Z800 Family history of malignant neoplasm of digestive organs: Secondary | ICD-10-CM

## 2013-03-01 MED ORDER — OMEPRAZOLE 20 MG PO CPDR: 20.0000 mg | DELAYED_RELEASE_CAPSULE | Freq: Two times a day (BID) | ORAL | Status: DC

## 2013-03-01 MED ORDER — PEG-KCL-NACL-NASULF-NA ASC-C 100 G PO SOLR: 1.0000 | Freq: Once | ORAL | Status: DC

## 2013-03-01 NOTE — Patient Instructions (Addendum)
We have sent the following medications to your pharmacy for you to pick up at your convenience:omeprazole 20 mg to take one tablet by mouth twice daily.  You have been scheduled for a colonoscopy with propofol. Please follow written instructions given to you at your visit today.  Please pick up your prep kit at the pharmacy within the next 1-3 days. If you use inhalers (even only as needed), please bring them with you on the day of your procedure. Your physician has requested that you go to www.startemmi.com and enter the access code given to you at your visit today. This web site gives a general overview about your procedure. However, you should still follow specific instructions given to you by our office regarding your preparation for the procedure.  Thank you for choosing me and [facility].  Venita Lick. Pleas Koch., MD., Clementeen Graham  cc: Bufford Spikes, MD

## 2013-03-01 NOTE — Progress Notes (Addendum)
History of Present Illness: This is a 79 year old female returning for followup of GERD. Her nausea has abated. She feels it was due to ibuprofen use which is now discontinued. She is also concerned that omeprazole 40 mg twice daily caused mild nausea as well however she not experiencing any symptoms on omeprazole 10 mg daily. She has a mild intermittent cough. Her reflux symptoms are excellent control. Cough does not seem to correlate with her reflux activity.  Current Medications, Allergies, Past Medical History, Past Surgical History, Family History and Social History were reviewed in [employer] record.  Physical Exam: General: Well developed , well nourished, no acute distress Head: Normocephalic and atraumatic Eyes:  sclerae anicteric, EOMI Ears: Normal auditory acuity Mouth: No deformity or lesions Lungs: Clear throughout to auscultation Heart: Regular rate and rhythm; no murmurs, rubs or bruits Abdomen: Soft, non tender and non distended. No masses, hepatosplenomegaly or hernias noted. Normal Bowel sounds Musculoskeletal: Symmetrical with no gross deformities  Pulses:  Normal pulses noted Extremities: No clubbing, cyanosis, edema or deformities noted Neurological: Alert oriented x 4, grossly nonfocal Psychological:  Alert and cooperative. Normal mood and affect  Assessment and Recommendations:  1. GERD. Omeprazole 20 mg twice a day and antireflux measures long-term.  2. Personal history of colon polyps, type unknown and immediate family member with colon cancer. She is due for a five-year interval surveillance colonoscopy. Will attempt to obtain records from [facility] in [place], [place] for prior polyp pathology. The risks, benefits, and alternatives to colonoscopy with possible biopsy and possible polypectomy were discussed with the patient and they consent to proceed.    03/11/13: Records from colonoscopy and pathology on 05/08/04 received and reviewed. One  small ascending colon tubular adenoma removed.

## 2013-03-11 ENCOUNTER — Other Ambulatory Visit: Payer: Self-pay

## 2013-03-11 DIAGNOSIS — Z9012 Acquired absence of left breast and nipple: Secondary | ICD-10-CM

## 2013-03-11 DIAGNOSIS — Z1231 Encounter for screening mammogram for malignant neoplasm of breast: Secondary | ICD-10-CM

## 2013-03-26 ENCOUNTER — Ambulatory Visit
Admission: RE | Admit: 2013-03-26 | Discharge: 2013-03-26 | Disposition: A | Discharge status: Home / Self Care | Payer: Medicare Other | Source: Ambulatory Visit

## 2013-03-26 DIAGNOSIS — Z9012 Acquired absence of left breast and nipple: Secondary | ICD-10-CM

## 2013-03-26 DIAGNOSIS — Z1231 Encounter for screening mammogram for malignant neoplasm of breast: Secondary | ICD-10-CM

## 2013-04-11 ENCOUNTER — Ambulatory Visit (INDEPENDENT_AMBULATORY_CARE_PROVIDER_SITE_OTHER): Payer: Medicare Other | Admitting: Internal Medicine

## 2013-04-11 ENCOUNTER — Encounter: Payer: Self-pay | Admitting: Internal Medicine

## 2013-04-11 VITALS — BP 126/80 | HR 72 | Resp 12 | Ht 64.08 in | Wt 173.0 lb

## 2013-04-11 DIAGNOSIS — M8580 Other specified disorders of bone density and structure, unspecified site: Secondary | ICD-10-CM

## 2013-04-11 DIAGNOSIS — M1711 Unilateral primary osteoarthritis, right knee: Secondary | ICD-10-CM | POA: Insufficient documentation

## 2013-04-11 DIAGNOSIS — IMO0002 Reserved for concepts with insufficient information to code with codable children: Secondary | ICD-10-CM

## 2013-04-11 DIAGNOSIS — E785 Hyperlipidemia, unspecified: Secondary | ICD-10-CM | POA: Insufficient documentation

## 2013-04-11 DIAGNOSIS — M1710 Unilateral primary osteoarthritis, unspecified knee: Secondary | ICD-10-CM

## 2013-04-11 DIAGNOSIS — N8110 Cystocele, unspecified: Secondary | ICD-10-CM | POA: Insufficient documentation

## 2013-04-11 DIAGNOSIS — J309 Allergic rhinitis, unspecified: Secondary | ICD-10-CM

## 2013-04-11 DIAGNOSIS — K219 Gastro-esophageal reflux disease without esophagitis: Secondary | ICD-10-CM | POA: Insufficient documentation

## 2013-04-11 DIAGNOSIS — M899 Disorder of bone, unspecified: Secondary | ICD-10-CM

## 2013-04-11 DIAGNOSIS — Z23 Encounter for immunization: Secondary | ICD-10-CM

## 2013-04-11 DIAGNOSIS — N8111 Cystocele, midline: Secondary | ICD-10-CM

## 2013-04-11 NOTE — Progress Notes (Signed)
Patient ID: Rebecca Guzman, female   DOB: 07/14/1934, 79 y.o.   MRN: [id number] Location:  [facility]  Code Status: full code at this point;  Discussed living will, MOST, HCPOA--will discuss these with her family and try to get them completed--at this time she is certainly competent to make her own decisions  Allergies  Allergen Reactions  . Codeine   . Morphine And Related     Nausea, and drop in blood pressure    Chief Complaint  Patient presents with  . Establish Care    New Patient establish care, right rea concerns- ? fluid in ear   . Labs Only    fasting if any labs due, patient indicates she is late getting labs (usually gets labs every 6 months)     HPI: Patient is a 79 y.o. female seen in the office today to establish with the practice.  She recently moved here.  She was referred by Dr. Madie Reno at [facility].    Moved near her family from [place] upon retiring.  Shares her apt with her daughter.    Acid reflux and coughing with allergies.  Also has high cholesterol that she needs to keep in check.    A little arthritis in her right knee and fingers.  If doesn't walk it gets worse.    Breast cancer 22 years ago--no difficulties since.    Has tried kegel exercises but they didn't seem to help.  Had hysterectomy 2002.  Bladder was tacked up then, but didn't hold.  Prolapse has gotten worse of her bladder.  Hasn't wanted surgery for this.    Review of Systems:  Review of Systems  Constitutional: Negative for fever and chills.  HENT: Positive for ear pain. Negative for congestion, sore throat and ear discharge.   Eyes: Negative for blurred vision.  Respiratory: Positive for cough. Negative for shortness of breath and wheezing.   Cardiovascular: Negative for chest pain.  Gastrointestinal: Positive for heartburn. Negative for abdominal pain, diarrhea, constipation, blood in stool and melena.  Genitourinary:       H/o uterine  prolapse s/p hysterectomy; now with bladder prolapse  Musculoskeletal: Positive for joint pain. Negative for falls.  Skin: Negative for rash.  Neurological: Positive for dizziness. Negative for headaches.       H/o vertigo, one recent episode when sat up too quickly in bed--associated with fluid in left ear  Endo/Heme/Allergies: Does not bruise/bleed easily.  Psychiatric/Behavioral: Negative for depression and memory loss. The patient is not nervous/anxious and does not have insomnia.     Past Medical History  Diagnosis Date  . GERD (gastroesophageal reflux disease)   . Uterine prolapse   . Breast cancer 1992  . Asthma   . Arthritis   . Colon polyps     Dr. Cleotis Nipper in [place]  . Gallstones   . Elevated cholesterol    Past Surgical History  Procedure Laterality Date  . Pelvic floor repair    . Laparoscopic assisted vaginal hysterectomy    . Cholecystectomy  1985    Dr. Cleotis Nipper (retired)   . Mastectomy  1992    Left  . Colonoscopy  2009   Social History:   reports that she quit smoking about 29 years ago. Her smoking use included Cigarettes. She smoked 0.00 packs per day for 13 years. She has never used smokeless tobacco. She reports that she does not drink alcohol or use illicit drugs.  Family History  Problem Relation Age of Onset  . Heart attack Mother   . Colon cancer Mother   . Hypertension Mother   . Heart disease Father   . Breast cancer Sister     Medications: Patient's Medications  New Prescriptions   No medications on file  Previous Medications   ACETAMINOPHEN (TYLENOL) 500 MG TABLET    Take 500 mg by mouth every 6 (six) hours as needed.   ALBUTEROL (PROVENTIL HFA;VENTOLIN HFA) 108 (90 BASE) MCG/ACT INHALER    Inhale 2 puffs into the lungs as needed for wheezing.   CALCIUM CARBONATE-VITAMIN D (CALTRATE 600+D PO)    Take 2 tablets by mouth daily. Getting 2000 of vit D per day   CICLESONIDE (ALVESCO) 80 MCG/ACT INHALER    Inhale 1 puff into the lungs daily.     FEXOFENADINE (ALLEGRA) 180 MG TABLET    Take 180 mg by mouth daily.   MULTIPLE VITAMIN (MULTIVITAMIN) TABLET    Take 1 tablet by mouth daily.   OMEPRAZOLE (PRILOSEC) 20 MG CAPSULE    Take 1 capsule (20 mg total) by mouth 2 (two) times daily.   PEG 3350 POWDER (MOVIPREP) 100 G SOLR    Take 1 kit (200 g total) by mouth once.   PRAVASTATIN (PRAVACHOL) 20 MG TABLET    Take 20 mg by mouth daily.   PROBIOTIC PRODUCT (PROBIOTIC FORMULA PO)    Take 1 tablet by mouth daily.  Modified Medications   No medications on file  Discontinued Medications   No medications on file     Physical Exam: Filed Vitals:   04/11/13 0832  BP: 126/80  Pulse: 72  Resp: 12  Height: 5' 4.07" (1.628 m)  Weight: 173 lb (78.472 kg)   Physical Exam  Constitutional: She is oriented to person, place, and time. She appears well-developed and well-nourished. No distress.  HENT:  Head: Normocephalic and atraumatic.  Right Ear: External ear normal.  Left Ear: External ear normal.  Nose: Nose normal.  Mouth/Throat: Oropharynx is clear and moist.  Left ear with fluid behind TM, slightly dull;  Right wnl  Eyes: EOM are normal. Pupils are equal, round, and reactive to light.  Neck: Neck supple. No JVD present. No thyromegaly present.  Cardiovascular: Normal rate, regular rhythm, normal heart sounds and intact distal pulses.   Pulmonary/Chest: Effort normal and breath sounds normal. No respiratory distress. She has no wheezes.  Abdominal: Soft. Bowel sounds are normal. She exhibits no distension and no mass. There is no tenderness. No hernia.  Musculoskeletal: Normal range of motion. She exhibits no edema and no tenderness.  Lymphadenopathy:    She has no cervical adenopathy.  Neurological: She is alert and oriented to person, place, and time. No cranial nerve deficit.  Skin: Skin is warm and dry. There is pallor.  Psychiatric: She has a normal mood and affect. Her behavior is normal. Judgment and thought content  normal.    Labs reviewed: Did bring her old labwork with her  Past Procedures: Bone density 2012--osteopenia Mammogram 8/14--normal Colonoscopy with polyps 2009, going again 10/14  Assessment/Plan 1. Need for prophylactic vaccination and inoculation against influenza - Flu Vaccine QUAD 36+ mos PF IM (Fluarix) given today  2. Osteoarthritis of right knee -bothersome if she is not active, helped with walking and remaining active--encouraged her to continue this  3. Hyperlipidemia LDL goal <100 -check FLP today -previously has had high HDL and LDL, normal TG -cont diet and exercise  4. GERD (gastroesophageal reflux disease) -with  acidic foods, will still have symptoms -continues omeprazole  5. Bladder prolapse, female, acquired -was following with gyn--will return there prn, she says b/c she has no plans for surgery at this time  6. Allergic sinusitis -recently with fluid in left ear, some associated vertigo, cont nasal saline spray, f/u with Dr. Madie Reno as planned  Labs/tests ordered:  Cbc, cmp, flp, vit D Next appt:  3 mos for pneumococcal, tetanus shots, possible bone density if due

## 2013-04-11 NOTE — Progress Notes (Signed)
Patient ID: Rebecca Guzman, female   DOB: 02-01-1934, 79 y.o.   MRN: [id number] Labs from previous provider 05/24/12:   Glucose 92, Na 139, K 4.3, BUN 14, cr 0.88, nl transaminases, alb 4.2, Vit D 31.6, Mg 2.1, wbc 5.2, h/h 13.5/41.3, plts 263, lipids:  TC 212, TG 118, LDL 126, HDL 62, TSH 3.9

## 2013-04-12 ENCOUNTER — Other Ambulatory Visit: Payer: Self-pay

## 2013-04-12 LAB — COMPREHENSIVE METABOLIC PANEL
ALT: 18 IU/L (ref 0–32)
AST: 21 IU/L (ref 0–40)
Albumin/Globulin Ratio: 1.9 (ref 1.1–2.5)
Albumin: 4.6 g/dL (ref 3.5–4.8)
Alkaline Phosphatase: 88 IU/L (ref 39–117)
BUN/Creatinine Ratio: 17 (ref 11–26)
BUN: 13 mg/dL (ref 8–27)
CO2: 23 mmol/L (ref 18–29)
Calcium: 9.7 mg/dL (ref 8.6–10.2)
Chloride: 102 mmol/L (ref 97–108)
Creatinine, Ser: 0.76 mg/dL (ref 0.57–1.00)
GFR calc Af Amer: 88 mL/min/{1.73_m2} (ref >59)
GFR calc non Af Amer: 76 mL/min/{1.73_m2} (ref >59)
Globulin, Total: 2.4 g/dL (ref 1.5–4.5)
Glucose: 90 mg/dL (ref 65–99)
Potassium: 4.5 mmol/L (ref 3.5–5.2)
Sodium: 141 mmol/L (ref 134–144)
Total Bilirubin: 0.5 mg/dL (ref 0.0–1.2)
Total Protein: 7 g/dL (ref 6.0–8.5)

## 2013-04-12 LAB — CBC WITH DIFFERENTIAL/PLATELET
Basophils Absolute: 0 10*3/uL (ref 0.0–0.2)
Basos: 0 % (ref 0–3)
Eos: 3 % (ref 0–5)
Eosinophils Absolute: 0.2 10*3/uL (ref 0.0–0.4)
HCT: 41.1 % (ref 34.0–46.6)
Hemoglobin: 13.9 g/dL (ref 11.1–15.9)
Immature Grans (Abs): 0 10*3/uL (ref 0.0–0.1)
Immature Granulocytes: 0 % (ref 0–2)
Lymphocytes Absolute: 1.9 10*3/uL (ref 0.7–3.1)
Lymphs: 28 % (ref 14–46)
MCH: 30.3 pg (ref 26.6–33.0)
MCHC: 33.8 g/dL (ref 31.5–35.7)
MCV: 90 fL (ref 79–97)
Monocytes Absolute: 0.5 10*3/uL (ref 0.1–0.9)
Monocytes: 7 % (ref 4–12)
Neutrophils Absolute: 4.2 10*3/uL (ref 1.4–7.0)
Neutrophils Relative %: 62 % (ref 40–74)
RBC: 4.59 x10E6/uL (ref 3.77–5.28)
RDW: 14 % (ref 12.3–15.4)
WBC: 6.7 10*3/uL (ref 3.4–10.8)

## 2013-04-12 LAB — LIPID PANEL
Chol/HDL Ratio: 3.6 ratio units (ref 0.0–4.4)
Cholesterol, Total: 224 mg/dL — ABNORMAL HIGH (ref 100–199)
HDL: 63 mg/dL (ref >39)
LDL Calculated: 127 mg/dL — ABNORMAL HIGH (ref 0–99)
Triglycerides: 172 mg/dL — ABNORMAL HIGH (ref 0–149)
VLDL Cholesterol Cal: 34 mg/dL (ref 5–40)

## 2013-04-12 LAB — VITAMIN D 25 HYDROXY (VIT D DEFICIENCY, FRACTURES): Vit D, 25-Hydroxy: 47.4 ng/mL (ref 30.0–100.0)

## 2013-04-12 MED ORDER — PRAVASTATIN SODIUM 40 MG PO TABS: 40.0000 mg | ORAL_TABLET | Freq: Every day | ORAL | Status: DC

## 2013-04-12 NOTE — Telephone Encounter (Signed)
See lab results dated 04/11/2013

## 2013-06-04 ENCOUNTER — Encounter: Payer: Self-pay | Admitting: Gastroenterology

## 2013-06-04 ENCOUNTER — Ambulatory Visit (AMBULATORY_SURGERY_CENTER): Payer: Medicare Other | Admitting: Gastroenterology

## 2013-06-04 VITALS — BP 138/72 | HR 73 | Temp 98.5°F | Resp 20 | Ht 64.0 in | Wt 174.0 lb

## 2013-06-04 DIAGNOSIS — D126 Benign neoplasm of colon, unspecified: Secondary | ICD-10-CM

## 2013-06-04 DIAGNOSIS — Z86010 Personal history of colonic polyps: Secondary | ICD-10-CM

## 2013-06-04 MED ORDER — SODIUM CHLORIDE 0.9 % IV SOLN: 500.0000 mL | INTRAVENOUS | Status: DC

## 2013-06-04 NOTE — Op Note (Signed)
 [facility] [address] [place], [address]   COLONOSCOPY PROCEDURE REPORT PATIENT: Rebecca Guzman, Rebecca Guzman  MR#: [id number] BIRTHDATE: [birth year] , 79  yrs. old GENDER: Female ENDOSCOPIST: Meryl Dare, MD, [facility] REFERRED VH:QIONGEX Renato Gails, M.D. PROCEDURE DATE:  06/04/2013 PROCEDURE:   Colonoscopy with snare polypectomy First Screening Colonoscopy - Avg.  risk and is 50 yrs.  old or older - No.  Prior Negative Screening - Now for repeat screening. N/A  History of Adenoma - Now for follow-up colonoscopy & has been > or = to 3 yrs.  Yes hx of adenoma.  Has been 3 or more years since last colonoscopy.  Polyps Removed Today? Yes. ASA CLASS:   Class II INDICATIONS:Patient's personal history of adenomatous colon polyps.  MEDICATIONS: MAC sedation, administered by CRNA and propofol (Diprivan) 140mg  IV DESCRIPTION OF PROCEDURE:   After the risks benefits and alternatives of the procedure were thoroughly explained, informed consent was obtained.  A digital rectal exam revealed no abnormalities of the rectum.   The [id number]  endoscope was introduced through the anus and advanced to the cecum, which was identified by both the appendix and ileocecal valve. No adverse events experienced.   The quality of the prep was good, using MoviPrep  The instrument was then slowly withdrawn as the colon was fully examined.  COLON FINDINGS: A sessile polyp measuring 6 mm in size was found at the cecum.  A polypectomy was performed with a cold snare.  The resection was complete and the polyp tissue was completely retrieved.   A pedunculated polyp measuring 7 mm in size was found in the transverse colon.  A polypectomy was performed with a cold snare.  The resection was complete and the polyp tissue was completely retrieved.   A sessile polyp measuring 5 mm in size was found in the rectum.  A polypectomy was performed with a cold snare.  The resection was complete and the polyp  tissue was completely retrieved.   The colon was otherwise normal.  There was no diverticulosis, inflammation, polyps or cancers unless previously stated.  Retroflexed views revealed no abnormalities. The time to cecum=3 minutes 21 seconds.  Withdrawal time=11 minutes 13 seconds.  The scope was withdrawn and the procedure completed. COMPLICATIONS: There were no complications. ENDOSCOPIC IMPRESSION: 1.   Sessile polyp measuring 6 mm at the cecum; polypectomy performed with a cold snare 2.   Pedunculated polyp measuring 7 mm in the transverse colon; polypectomy performed with a cold snare 3.   Sessile polyp measuring 5 mm in the rectum; polypectomy performed with a cold snare  RECOMMENDATIONS: 1.  Await pathology results 2.  Given your age, you will not need another colonoscopy for colon cancer screening or polyp surveillance.  These types of tests usually stop around the age 9.  eSigned:  Meryl Dare, MD, [facility] 06/04/2013 8:35 AM

## 2013-06-04 NOTE — Progress Notes (Signed)
Patient did not have preoperative order for IV antibiotic SSI prophylaxis. (G8918)  Patient did not experience any of the following events: a burn prior to discharge; a fall within the facility; wrong site/side/patient/procedure/implant event; or a hospital transfer or hospital admission upon discharge from the facility. (G8907)  

## 2013-06-04 NOTE — Patient Instructions (Signed)
YOU HAD AN ENDOSCOPIC PROCEDURE TODAY AT THE [facility]: Refer to the procedure report that was given to you for any specific questions about what was found during the examination.  If the procedure report does not answer your questions, please call your gastroenterologist to clarify.  If you requested that your care partner not be given the details of your procedure findings, then the procedure report has been included in a sealed envelope for you to review at your convenience later.  YOU SHOULD EXPECT: Some feelings of bloating in the abdomen. Passage of more gas than usual.  Walking can help get rid of the air that was put into your GI tract during the procedure and reduce the bloating. If you had a lower endoscopy (such as a colonoscopy or flexible sigmoidoscopy) you may notice spotting of blood in your stool or on the toilet paper. If you underwent a bowel prep for your procedure, then you may not have a normal bowel movement for a few days.  DIET: Your first meal following the procedure should be a light meal and then it is ok to progress to your normal diet.  A half-sandwich or bowl of soup is an example of a good first meal.  Heavy or fried foods are harder to digest and may make you feel nauseous or bloated.  Likewise meals heavy in dairy and vegetables can cause extra gas to form and this can also increase the bloating.  Drink plenty of fluids but you should avoid alcoholic beverages for 24 hours.  ACTIVITY: Your care partner should take you home directly after the procedure.  You should plan to take it easy, moving slowly for the rest of the day.  You can resume normal activity the day after the procedure however you should NOT DRIVE or use heavy machinery for 24 hours (because of the sedation medicines used during the test).    SYMPTOMS TO REPORT IMMEDIATELY: A gastroenterologist can be reached at any hour.  During normal business hours, 8:30 AM to 5:00 PM Monday through Friday,  call [phone].  After hours and on weekends, please call the GI answering service at [phone] who will take a message and have the physician on call contact you.   Following lower endoscopy (colonoscopy or flexible sigmoidoscopy):  Excessive amounts of blood in the stool  Significant tenderness or worsening of abdominal pains  Swelling of the abdomen that is new, acute  Fever of 100F or higher  FOLLOW UP: If any biopsies were taken you will be contacted by phone or by letter within the next 1-3 weeks.  Call your gastroenterologist if you have not heard about the biopsies in 3 weeks.  Our staff will call the home number listed on your records the next business day following your procedure to check on you and address any questions or concerns that you may have at that time regarding the information given to you following your procedure. This is a courtesy call and so if there is no answer at the home number and we have not heard from you through the emergency physician on call, we will assume that you have returned to your regular daily activities without incident.  SIGNATURES/CONFIDENTIALITY: You and/or your care partner have signed paperwork which will be entered into your electronic medical record.  These signatures attest to the fact that that the information above on your After Visit Summary has been reviewed and is understood.  Full responsibility of the confidentiality of this   discharge information lies with you and/or your care-partner.  Recommendations See procedure report  

## 2013-06-04 NOTE — Progress Notes (Signed)
Called to room to assist during endoscopic procedure.  Patient ID and intended procedure confirmed with present staff. Received instructions for my participation in the procedure from the performing physician.  I did not get in procedure room until procedure was complete and pt had gone to the recovery room.  Maw

## 2013-06-04 NOTE — Progress Notes (Signed)
A/ox3 pleased with MAC, report to Wendy RN 

## 2013-06-05 ENCOUNTER — Telehealth: Payer: Self-pay

## 2013-06-05 NOTE — Telephone Encounter (Signed)
  Follow up Call-  Call back number 06/04/2013  Post procedure Call Back phone  # [phone]  Permission to leave phone message Yes     Patient questions:  Do you have a fever, pain , or abdominal swelling? no Pain Score  0 *  Have you tolerated food without any problems? yes  Have you been able to return to your normal activities? yes  Do you have any questions about your discharge instructions: Diet   no Medications  no Follow up visit  no  Do you have questions or concerns about your Care? no  Actions: * If pain score is 4 or above: No action needed, pain <4.  No problems per the pt. Maw

## 2013-06-11 ENCOUNTER — Encounter: Payer: Self-pay | Admitting: Gastroenterology

## 2013-07-11 ENCOUNTER — Encounter: Payer: Self-pay | Admitting: Internal Medicine

## 2013-07-11 ENCOUNTER — Ambulatory Visit (INDEPENDENT_AMBULATORY_CARE_PROVIDER_SITE_OTHER): Payer: Medicare Other | Admitting: Internal Medicine

## 2013-07-11 VITALS — BP 138/80 | HR 80 | Temp 97.8°F | Resp 18 | Ht 64.0 in | Wt 176.2 lb

## 2013-07-11 DIAGNOSIS — M1711 Unilateral primary osteoarthritis, right knee: Secondary | ICD-10-CM

## 2013-07-11 DIAGNOSIS — M1710 Unilateral primary osteoarthritis, unspecified knee: Secondary | ICD-10-CM

## 2013-07-11 DIAGNOSIS — E785 Hyperlipidemia, unspecified: Secondary | ICD-10-CM

## 2013-07-11 DIAGNOSIS — N8111 Cystocele, midline: Secondary | ICD-10-CM

## 2013-07-11 DIAGNOSIS — N8110 Cystocele, unspecified: Secondary | ICD-10-CM

## 2013-07-11 DIAGNOSIS — Z23 Encounter for immunization: Secondary | ICD-10-CM

## 2013-07-11 DIAGNOSIS — IMO0002 Reserved for concepts with insufficient information to code with codable children: Secondary | ICD-10-CM

## 2013-07-11 DIAGNOSIS — Z86010 Personal history of colonic polyps: Secondary | ICD-10-CM

## 2013-07-11 MED ORDER — TETANUS-DIPHTH-ACELL PERTUSSIS 5-2.5-18.5 LF-MCG/0.5 IM SUSP: 0.5000 mL | Freq: Once | INTRAMUSCULAR | Status: DC

## 2013-07-11 NOTE — Progress Notes (Signed)
Patient ID: Rebecca Guzman, female   DOB: [birth year], 79 y.o.   MRN: [id number]   Location:  [facility] / [place] [facility]  Code Status: to talk more with family about her goals of care--did not bring this yet  Allergies  Allergen Reactions  . Codeine   . Morphine And Related     Nausea, and drop in blood pressure    Chief Complaint  Patient presents with  . Follow-up    HPI: Patient is a 79 y.o.  seen in the office today for med mgt of chronic diseases. No new concerns.  Arthritis in finger and knees unchanged.  Incontinence unchanged.   Has been taking pravastatin 40mg  now since sept.   Needs tetanus booster and also needs her pneumonia vaccines--had pneumococcal years and years ago. Needs f/u cscope in 3 years due to hyperplastic and tubular adenoma on cscope.    Review of Systems:  Review of Systems  Constitutional: Negative for fever and chills.  Respiratory: Negative for shortness of breath.   Cardiovascular: Negative for chest pain.  Gastrointestinal: Negative for constipation, blood in stool and melena.  Genitourinary: Positive for urgency.       Still with some stress and urge incontinence, not helped tremendously with kegels  Musculoskeletal: Positive for joint pain.       Knee and thumb arthritis  Neurological: Negative for dizziness.  Endo/Heme/Allergies: Does not bruise/bleed easily.  Psychiatric/Behavioral: Negative for depression.    Past Medical History  Diagnosis Date  . GERD (gastroesophageal reflux disease)   . Uterine prolapse     s/p hysterectomy  . Breast cancer 1992  . Asthma   . Osteoarthritis of right knee   . Colon polyps     Dr. Cleotis Nipper in [place]  . Gallstones   . Hyperlipidemia LDL goal <100   . Bladder prolapse, female, acquired   . Allergic sinusitis     Past Surgical History  Procedure Laterality Date  . Pelvic floor repair    . Laparoscopic assisted vaginal hysterectomy    . Cholecystectomy  1985   Dr. Cleotis Nipper (retired)   . Mastectomy  1992    Left  . Colonoscopy  2009    Social History:   reports that she quit smoking about 29 years ago. Her smoking use included Cigarettes. She smoked 0.00 packs per day for 13 years. She has never used smokeless tobacco. She reports that she does not drink alcohol or use illicit drugs.  Family History  Problem Relation Age of Onset  . Heart attack Mother   . Colon cancer Mother   . Hypertension Mother   . Heart disease Father   . Breast cancer Sister     Medications: Patient's Medications  New Prescriptions   No medications on file  Previous Medications   ACETAMINOPHEN (TYLENOL) 500 MG TABLET    Take 500 mg by mouth every 6 (six) hours as needed.   ALBUTEROL (PROVENTIL HFA;VENTOLIN HFA) 108 (90 BASE) MCG/ACT INHALER    Inhale 2 puffs into the lungs as needed for wheezing.   CALCIUM CARBONATE-VITAMIN D (CALTRATE 600+D PO)    Take 2 tablets by mouth daily. Getting 2000 of vit D per day   CICLESONIDE (ALVESCO) 80 MCG/ACT INHALER    Inhale 1 puff into the lungs daily.    FEXOFENADINE (ALLEGRA) 180 MG TABLET    Take 180 mg by mouth daily.   FLUTICASONE (FLONASE) 50 MCG/ACT NASAL SPRAY       MULTIPLE  VITAMIN (MULTIVITAMIN) TABLET    Take 1 tablet by mouth daily.   OMEPRAZOLE (PRILOSEC) 20 MG CAPSULE    Take 1 capsule (20 mg total) by mouth 2 (two) times daily.   PRAVASTATIN (PRAVACHOL) 40 MG TABLET    Take 1 tablet (40 mg total) by mouth daily.   PROBIOTIC PRODUCT (PROBIOTIC FORMULA PO)    Take 1 tablet by mouth daily.  Modified Medications   No medications on file  Discontinued Medications   No medications on file     Physical Exam: Filed Vitals:   07/11/13 0753  BP: 138/80  Pulse: 80  Temp: 97.8 F (36.6 C)  TempSrc: Oral  Resp: 18  Height: 5\' 4"  (1.626 m)  Weight: 176 lb 3.2 oz (79.924 kg)  SpO2: 97%  Physical Exam  Constitutional: She is oriented to person, place, and time. She appears well-developed and well-nourished.    Well dressed female, pleasant, NAD  Cardiovascular: Normal rate, regular rhythm, normal heart sounds and intact distal pulses.   Pulmonary/Chest: Effort normal and breath sounds normal. No respiratory distress.  Musculoskeletal: Normal range of motion.  Neurological: She is alert and oriented to person, place, and time.  Skin: Skin is warm and dry.    Labs reviewed: Basic Metabolic Panel:  Recent Labs  86/57/84 0950  NA 141  K 4.5  CL 102  CO2 23  GLUCOSE 90  BUN 13  CREATININE 0.76  CALCIUM 9.7   Liver Function Tests:  Recent Labs  04/11/13 0950  AST 21  ALT 18  ALKPHOS 88  BILITOT 0.5  PROT 7.0   No results found for this basename: LIPASE, AMYLASE,  in the last 8760 hours No results found for this basename: AMMONIA,  in the last 8760 hours CBC:  Recent Labs  04/11/13 0950  WBC 6.7  NEUTROABS 4.2  HGB 13.9  HCT 41.1  MCV 90   Lipid Panel:  Recent Labs  04/11/13 0950  HDL 63  LDLCALC 127*  TRIG 172*  CHOLHDL 3.6   Past Procedures: Cscope 10/14--needs 3 year f/u Mammogram 8/14 nl Bone density 2012  Assessment/Plan 1. Hyperlipidemia LDL goal <100 -cont pravachol 40mg  and recheck labs before next visit - Lipid panel; Future - CBC with Differential; Future - Comprehensive metabolic panel; Future  2. Osteoarthritis of right knee Stable, no new interventions needed  3. Bladder prolapse, female, acquired Stable, desires no changes  4. Need for prophylactic vaccination against Streptococcus pneumoniae (pneumococcus) - Pneumococcal conjugate vaccine 13-valent--prevnar given -Pneumococcal 23-valent in 6 mos  5. Hx of adenomatous colonic polyps - CBC with Differential; Future -needs f/u csope in 3 years (10/17)  6. Need for prophylactic vaccination with combined diphtheria-tetanus-pertussis (DTP) vaccine - given script for Tdap (BOOSTRIX) 5-2.5-18.5 LF-MCG/0.5 injection; Inject 0.5 mLs into the muscle once.  Dispense: 0.5 mL; Refill:  0  Labs/tests ordered:  Cbc, cmp, flp Next appt:  6 mos EV, labs before

## 2013-07-11 NOTE — Patient Instructions (Signed)
Please return in 6 months with labs before.  You got your prevnar vaccine today and I printed you a prescription for your tetanus booster which you can get in a week or so at your pharmacy to prevent tetanus and whooping cough. We can give you your pneumococcal vaccine next visit.

## 2013-08-06 ENCOUNTER — Other Ambulatory Visit: Payer: Self-pay | Admitting: *Deleted

## 2013-08-06 MED ORDER — PRAVASTATIN SODIUM 40 MG PO TABS: 40.0000 mg | ORAL_TABLET | Freq: Every day | ORAL | Status: DC

## 2013-09-30 ENCOUNTER — Other Ambulatory Visit: Payer: Self-pay

## 2013-09-30 MED ORDER — OMEPRAZOLE 20 MG PO CPDR: 20.0000 mg | DELAYED_RELEASE_CAPSULE | Freq: Two times a day (BID) | ORAL | Status: DC

## 2014-01-15 ENCOUNTER — Other Ambulatory Visit: Payer: Medicare Other

## 2014-01-15 DIAGNOSIS — M1711 Unilateral primary osteoarthritis, right knee: Secondary | ICD-10-CM

## 2014-01-15 DIAGNOSIS — Z86010 Personal history of colonic polyps: Secondary | ICD-10-CM

## 2014-01-15 DIAGNOSIS — E785 Hyperlipidemia, unspecified: Secondary | ICD-10-CM

## 2014-01-15 LAB — TSH: TSH: 5.2 u[IU]/mL (ref <=5.90)

## 2014-01-16 LAB — CBC WITH DIFFERENTIAL/PLATELET
Basophils Absolute: 0 10*3/uL (ref 0.0–0.2)
Basos: 0 %
Eos: 2 %
Eosinophils Absolute: 0.1 10*3/uL (ref 0.0–0.4)
HCT: 38.6 % (ref 34.0–46.6)
Hemoglobin: 13.5 g/dL (ref 11.1–15.9)
Immature Grans (Abs): 0 10*3/uL (ref 0.0–0.1)
Immature Granulocytes: 0 %
Lymphocytes Absolute: 2.1 10*3/uL (ref 0.7–3.1)
Lymphs: 31 %
MCH: 30.7 pg (ref 26.6–33.0)
MCHC: 35 g/dL (ref 31.5–35.7)
MCV: 88 fL (ref 79–97)
Monocytes Absolute: 0.4 10*3/uL (ref 0.1–0.9)
Monocytes: 6 %
Neutrophils Absolute: 4.2 10*3/uL (ref 1.4–7.0)
Neutrophils Relative %: 61 %
RBC: 4.4 x10E6/uL (ref 3.77–5.28)
RDW: 14.1 % (ref 12.3–15.4)
WBC: 6.9 10*3/uL (ref 3.4–10.8)

## 2014-01-16 LAB — LIPID PANEL
Chol/HDL Ratio: 3.1 ratio units (ref 0.0–4.4)
Cholesterol, Total: 170 mg/dL (ref 100–199)
HDL: 54 mg/dL (ref >39)
LDL Calculated: 79 mg/dL (ref 0–99)
Triglycerides: 187 mg/dL — ABNORMAL HIGH (ref 0–149)
VLDL Cholesterol Cal: 37 mg/dL (ref 5–40)

## 2014-01-16 LAB — COMPREHENSIVE METABOLIC PANEL
ALT: 15 IU/L (ref 0–32)
AST: 20 IU/L (ref 0–40)
Albumin/Globulin Ratio: 1.9 (ref 1.1–2.5)
Albumin: 4.3 g/dL (ref 3.5–4.8)
Alkaline Phosphatase: 76 IU/L (ref 39–117)
BUN/Creatinine Ratio: 17 (ref 11–26)
BUN: 14 mg/dL (ref 8–27)
CO2: 23 mmol/L (ref 18–29)
Calcium: 9.2 mg/dL (ref 8.7–10.3)
Chloride: 101 mmol/L (ref 97–108)
Creatinine, Ser: 0.84 mg/dL (ref 0.57–1.00)
GFR calc Af Amer: 77 mL/min/{1.73_m2} (ref >59)
GFR calc non Af Amer: 67 mL/min/{1.73_m2} (ref >59)
Globulin, Total: 2.3 g/dL (ref 1.5–4.5)
Glucose: 92 mg/dL (ref 65–99)
Potassium: 4.3 mmol/L (ref 3.5–5.2)
Sodium: 140 mmol/L (ref 134–144)
Total Bilirubin: 0.5 mg/dL (ref 0.0–1.2)
Total Protein: 6.6 g/dL (ref 6.0–8.5)

## 2014-01-17 ENCOUNTER — Ambulatory Visit (INDEPENDENT_AMBULATORY_CARE_PROVIDER_SITE_OTHER): Payer: Medicare Other | Admitting: Internal Medicine

## 2014-01-17 ENCOUNTER — Encounter: Payer: Self-pay | Admitting: Internal Medicine

## 2014-01-17 VITALS — BP 126/70 | HR 92 | Temp 98.0°F | Resp 18 | Ht 64.0 in | Wt 174.0 lb

## 2014-01-17 DIAGNOSIS — M949 Disorder of cartilage, unspecified: Secondary | ICD-10-CM

## 2014-01-17 DIAGNOSIS — IMO0002 Reserved for concepts with insufficient information to code with codable children: Secondary | ICD-10-CM

## 2014-01-17 DIAGNOSIS — N8111 Cystocele, midline: Secondary | ICD-10-CM

## 2014-01-17 DIAGNOSIS — R5381 Other malaise: Secondary | ICD-10-CM

## 2014-01-17 DIAGNOSIS — M8580 Other specified disorders of bone density and structure, unspecified site: Secondary | ICD-10-CM

## 2014-01-17 DIAGNOSIS — M1711 Unilateral primary osteoarthritis, right knee: Secondary | ICD-10-CM

## 2014-01-17 DIAGNOSIS — K219 Gastro-esophageal reflux disease without esophagitis: Secondary | ICD-10-CM

## 2014-01-17 DIAGNOSIS — Z0000 Encounter for general adult medical examination without abnormal findings: Secondary | ICD-10-CM

## 2014-01-17 DIAGNOSIS — M1710 Unilateral primary osteoarthritis, unspecified knee: Secondary | ICD-10-CM

## 2014-01-17 DIAGNOSIS — N8110 Cystocele, unspecified: Secondary | ICD-10-CM

## 2014-01-17 DIAGNOSIS — R5383 Other fatigue: Secondary | ICD-10-CM | POA: Insufficient documentation

## 2014-01-17 DIAGNOSIS — M899 Disorder of bone, unspecified: Secondary | ICD-10-CM

## 2014-01-17 DIAGNOSIS — E785 Hyperlipidemia, unspecified: Secondary | ICD-10-CM

## 2014-01-17 LAB — SPECIMEN STATUS REPORT

## 2014-01-17 NOTE — Progress Notes (Signed)
Patient ID: Rebecca Guzman, female   DOB: 09-Sep-1933, 80 y.o.   MRN: [id number]   Location:  [facility]  Code Status: does not have living will, took forms home, but did not look at them last time, daughter is not yet her hcpoa, but will be  Allergies  Allergen Reactions  . Codeine   . Morphine And Related     Nausea, and drop in blood pressure    Chief Complaint  Patient presents with  . Annual Exam    HPI: Patient is a 80 y.o. female seen in the office today for annual exam.  No colds or problems all winter.  Feeling fatigued.  Wonders about her b12 and thyroid levels.  So fatigued and weak, even lightheaded in the morning.  Improves throughout the day.  Retired last summer, so less active than she had been.  Does try to walk and exercise.  Admits this could all be less activity than when she worked.  Still using her prilosec for GERD.  Once in a great while will flare up--milk or mylanta helps.  Tomatoes are a culprit for her.  Had followed with Dr. Fuller Plan.    Sleeps well at night.  7-8 hrs per night.    Breast cancer was 1992--left mastectomy, had chemo for 6 mos, no XRT.  No recurrences.  Goes to [facility] center for mammograms and next due in [place] '15.  Asthma:  Breathing stable.  No problems.  Polyps:  Last cscope 10/14.  No more pap smears needed.  Has uterine prolapse.  No changes with it.  Doesn't bother her that much.  Some incontinence when first gets up in the morning.    Cholesterol improving.  TG still elevated.  Thinks her bone density is due--not done since living in [place] was [employer] in [place].    Review of Systems:  Review of Systems  Constitutional: Positive for malaise/fatigue. Negative for fever and chills.  HENT: Negative for congestion.   Eyes: Negative for blurred vision.  Respiratory: Negative for cough and wheezing.   Cardiovascular: Negative for chest pain and leg swelling.    Gastrointestinal: Positive for constipation. Negative for heartburn, abdominal pain, diarrhea, blood in stool and melena.       Resolved with regular probiotics  Genitourinary: Negative for dysuria.  Musculoskeletal: Negative for falls and myalgias.  Skin: Negative for rash.  Neurological: Negative for dizziness, loss of consciousness, weakness and headaches.  Endo/Heme/Allergies: Does not bruise/bleed easily.  Psychiatric/Behavioral: Negative for depression and memory loss.    Past Medical History  Diagnosis Date  . GERD (gastroesophageal reflux disease)   . Uterine prolapse     s/p hysterectomy  . Breast cancer 1992  . Asthma   . Osteoarthritis of right knee   . Colon polyps     Dr. Lindalou Hose in [place]  . Gallstones   . Hyperlipidemia LDL goal <100   . Bladder prolapse, female, acquired   . Allergic sinusitis     Past Surgical History  Procedure Laterality Date  . Pelvic floor repair    . Laparoscopic assisted vaginal hysterectomy    . Cholecystectomy  1985    Dr. Lindalou Hose (retired)   . Mastectomy  1992    Left  . Colonoscopy  2009    Social History:   reports that she quit smoking about 30 years ago. Her smoking use included Cigarettes. She smoked 0.00 packs per day for 13 years. She has  never used smokeless tobacco. She reports that she does not drink alcohol or use illicit drugs.  Family History  Problem Relation Age of Onset  . Heart attack Mother   . Colon cancer Mother   . Hypertension Mother   . Heart disease Father   . Breast cancer Sister     Medications: Patient's Medications  New Prescriptions   No medications on file  Previous Medications   ACETAMINOPHEN (TYLENOL) 500 MG TABLET    Take 500 mg by mouth every 6 (six) hours as needed.   ALBUTEROL (PROVENTIL HFA;VENTOLIN HFA) 108 (90 BASE) MCG/ACT INHALER    Inhale 2 puffs into the lungs as needed for wheezing.   CALCIUM CARBONATE-VITAMIN D (CALTRATE 600+D PO)    Take 2 tablets by mouth daily.  Getting 2000 of vit D per day   CICLESONIDE (ALVESCO) 80 MCG/ACT INHALER    Inhale 1 puff into the lungs daily.    FEXOFENADINE (ALLEGRA) 180 MG TABLET    Take 180 mg by mouth daily.   FLUTICASONE (FLONASE) 50 MCG/ACT NASAL SPRAY       MULTIPLE VITAMIN (MULTIVITAMIN) TABLET    Take 1 tablet by mouth daily.   OMEPRAZOLE (PRILOSEC) 20 MG CAPSULE    Take 1 capsule (20 mg total) by mouth 2 (two) times daily.   PRAVASTATIN (PRAVACHOL) 40 MG TABLET    Take 1 tablet (40 mg total) by mouth daily.   PROBIOTIC PRODUCT (PROBIOTIC FORMULA PO)    Take 1 tablet by mouth daily.   TDAP (BOOSTRIX) 5-2.5-18.5 LF-MCG/0.5 INJECTION    Inject 0.5 mLs into the muscle once.  Modified Medications   No medications on file  Discontinued Medications   No medications on file     Physical Exam: Filed Vitals:   01/17/14 0850  BP: 126/70  Pulse: 92  Temp: 98 F (36.7 C)  TempSrc: Oral  Resp: 18  Height: 5\' 4"  (1.626 m)  Weight: 174 lb (78.926 kg)  SpO2: 97%  Physical Exam  Constitutional: She is oriented to person, place, and time. She appears well-developed and well-nourished. No distress.  HENT:  Head: Normocephalic and atraumatic.  Right Ear: External ear normal.  Left Ear: External ear normal.  Nose: Nose normal.  Mouth/Throat: Oropharynx is clear and moist. No oropharyngeal exudate.  Eyes: Conjunctivae and EOM are normal. Pupils are equal, round, and reactive to light.  Neck: Neck supple. No JVD present. No thyromegaly present.  Cardiovascular: Normal rate, regular rhythm, normal heart sounds and intact distal pulses.   Pulmonary/Chest: Effort normal and breath sounds normal. No respiratory distress. Right breast exhibits no inverted nipple, no mass, no nipple discharge, no skin change and no tenderness.  Left mastectomy, no chest wall tenderness or masses palpable  Abdominal: Soft. Bowel sounds are normal. She exhibits no distension and no mass. There is no tenderness.  Musculoskeletal: Normal range  of motion. She exhibits edema. She exhibits no tenderness.  Of right medial knee  Neurological: She is alert and oriented to person, place, and time. No cranial nerve deficit.  Skin: Skin is warm and dry. There is pallor.  Psychiatric: She has a normal mood and affect.    Labs reviewed: Basic Metabolic Panel:  Recent Labs  04/11/13 0950 01/15/14 0816  NA 141 140  K 4.5 4.3  CL 102 101  CO2 23 23  GLUCOSE 90 92  BUN 13 14  CREATININE 0.76 0.84  CALCIUM 9.7 9.2   Liver Function Tests:  Recent Labs  04/11/13  0950 01/15/14 0816  AST 21 20  ALT 18 15  ALKPHOS 88 76  BILITOT 0.5 0.5  PROT 7.0 6.6  CBC:  Recent Labs  04/11/13 0950 01/15/14 0816  WBC 6.7 6.9  NEUTROABS 4.2 4.2  HGB 13.9 13.5  HCT 41.1 38.6  MCV 90 88   Lipid Panel:  Recent Labs  04/11/13 0950 01/15/14 0816  HDL 63 54  LDLCALC 127* 79  TRIG 172* 187*  CHOLHDL 3.6 3.1    Assessment/Plan 1. Osteopenia - DG Bone Density; Future - cont ca with vitamin D  2. Hyperlipidemia LDL goal <100 -cont pravachol   3. Other malaise and fatigue -will add on b12/folate and tsh to previously done labs to assess for etiology -may be due to less activity since retirement  4. Osteoarthritis of right knee -cont tylenol as needed, walking to keep joints lubricated -does have some swelling medial to patella  5. Bladder prolapse, female, acquired -is unchanged over several years, does have early am incontinence at times, but does not want intervention  6. 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 Encounter for general health examination -referred for bone density -uptodate on zostavax -had 23 valent pne-umococcal, but needs prevnar at  Dec appt -next mammo due after 9/14 -basic labs ok with improved cholesterol (protective hdl) -will check MMSE next time, but she has no concerns about her memory and I have no concerns about her memory either at present -PHQ 2 done and negative for depression -no falls -is physically active as above  Labs/tests ordered: Orders Placed This Encounter  Procedures  . DG Bone Density    Standing Status: Future     Number of Occurrences:      Standing Expiration Date: 03/20/2015    Order Specific Question:  Reason for Exam (SYMPTOM  OR DIAGNOSIS REQUIRED)    Answer:  scoliosis, osteopenia f/u bone density    Order Specific Question:  Preferred imaging location?    Answer:  [facility]   Added on b12/folate and tsh to prior labs  Next appt:  6 mos for prevnar and mmse

## 2014-01-20 LAB — TSH: TSH: 5.2 u[IU]/mL — ABNORMAL HIGH (ref 0.450–4.500)

## 2014-01-20 LAB — B12 AND FOLATE PANEL
Folate: >19.9 ng/mL (ref >3.0)
Vitamin B-12: 541 pg/mL (ref 211–946)

## 2014-01-20 LAB — SPECIMEN STATUS REPORT

## 2014-01-22 ENCOUNTER — Other Ambulatory Visit: Payer: Self-pay

## 2014-01-22 ENCOUNTER — Other Ambulatory Visit: Payer: Self-pay | Admitting: Dermatology

## 2014-01-22 DIAGNOSIS — E039 Hypothyroidism, unspecified: Secondary | ICD-10-CM

## 2014-01-23 ENCOUNTER — Other Ambulatory Visit: Payer: Self-pay | Admitting: *Deleted

## 2014-01-23 ENCOUNTER — Other Ambulatory Visit: Payer: Medicare Other

## 2014-01-23 DIAGNOSIS — E039 Hypothyroidism, unspecified: Secondary | ICD-10-CM

## 2014-01-24 ENCOUNTER — Telehealth: Payer: Self-pay | Admitting: *Deleted

## 2014-01-24 LAB — T4, FREE: Free T4: 1.04 ng/dL (ref 0.82–1.77)

## 2014-01-24 NOTE — Telephone Encounter (Signed)
Spoke with patient regarding having her TSH recheck in 6 weeks per Dr. Mariea Clonts. She stated that she understood and she will be here on the 02/20/2014 for this lab.

## 2014-01-27 NOTE — Progress Notes (Signed)
Spoke with patient regarding this T4 on Friday the 19th, she stated that she will be here for the appointment.

## 2014-01-30 ENCOUNTER — Ambulatory Visit
Admission: RE | Admit: 2014-01-30 | Discharge: 2014-01-30 | Disposition: A | Discharge status: Home / Self Care | Payer: 59 | Source: Ambulatory Visit | Attending: Internal Medicine | Admitting: Internal Medicine

## 2014-01-30 DIAGNOSIS — M8580 Other specified disorders of bone density and structure, unspecified site: Secondary | ICD-10-CM

## 2014-02-20 ENCOUNTER — Other Ambulatory Visit: Payer: Medicare Other

## 2014-02-20 DIAGNOSIS — E039 Hypothyroidism, unspecified: Secondary | ICD-10-CM

## 2014-02-21 LAB — TSH: TSH: 3.82 u[IU]/mL (ref 0.450–4.500)

## 2014-02-27 ENCOUNTER — Other Ambulatory Visit: Payer: Self-pay

## 2014-02-27 DIAGNOSIS — Z1231 Encounter for screening mammogram for malignant neoplasm of breast: Secondary | ICD-10-CM

## 2014-02-27 DIAGNOSIS — Z9012 Acquired absence of left breast and nipple: Secondary | ICD-10-CM

## 2014-03-27 ENCOUNTER — Ambulatory Visit
Admission: RE | Admit: 2014-03-27 | Discharge: 2014-03-27 | Disposition: A | Discharge status: Home / Self Care | Payer: 59 | Source: Ambulatory Visit

## 2014-03-27 DIAGNOSIS — Z9012 Acquired absence of left breast and nipple: Secondary | ICD-10-CM

## 2014-03-27 DIAGNOSIS — Z1231 Encounter for screening mammogram for malignant neoplasm of breast: Secondary | ICD-10-CM

## 2014-03-31 LAB — HM MAMMOGRAPHY: HM Mammogram: NORMAL

## 2014-04-15 ENCOUNTER — Other Ambulatory Visit: Payer: Self-pay

## 2014-04-15 MED ORDER — OMEPRAZOLE 20 MG PO CPDR: 20.0000 mg | DELAYED_RELEASE_CAPSULE | Freq: Two times a day (BID) | ORAL | Status: DC

## 2014-06-06 ENCOUNTER — Encounter: Payer: Self-pay | Admitting: *Deleted

## 2014-06-09 ENCOUNTER — Ambulatory Visit (INDEPENDENT_AMBULATORY_CARE_PROVIDER_SITE_OTHER): Payer: Medicare Other | Admitting: Gastroenterology

## 2014-06-09 ENCOUNTER — Encounter: Payer: Self-pay | Admitting: Gastroenterology

## 2014-06-09 VITALS — BP 110/68 | HR 80 | Ht 64.0 in | Wt 175.2 lb

## 2014-06-09 DIAGNOSIS — R059 Cough, unspecified: Secondary | ICD-10-CM

## 2014-06-09 DIAGNOSIS — K219 Gastro-esophageal reflux disease without esophagitis: Secondary | ICD-10-CM

## 2014-06-09 DIAGNOSIS — R05 Cough: Secondary | ICD-10-CM

## 2014-06-09 MED ORDER — PANTOPRAZOLE SODIUM 40 MG PO TBEC: 40.0000 mg | DELAYED_RELEASE_TABLET | Freq: Two times a day (BID) | ORAL | Status: DC

## 2014-06-09 NOTE — Patient Instructions (Signed)
We have sent the following medications to your pharmacy for you to pick up at your convenience: Pantoprazole 40 mg twice daily (in place of omeprazole)  CC:Dr [name]

## 2014-06-09 NOTE — Progress Notes (Signed)
    History of Present Illness: This is a 80 year old female returning for follow-up of GERD. She does not note any heartburn or regurgitation however she has sinus congestion postnasal drip and a frequent cough. She notes that her cough worsens when her postnasal drip worsens. She states she has had dyspeptic symptoms with omeprazole 40 mg twice daily.  Current Medications, Allergies, Past Medical History, Past Surgical History, Family History and Social History were reviewed in [employer] record.  Physical Exam: General: Well developed , well nourished, no acute distress Head: Normocephalic and atraumatic Eyes:  sclerae anicteric, EOMI Ears: Normal auditory acuity Mouth: No deformity or lesions Lungs: Clear throughout to auscultation Heart: Regular rate and rhythm; no murmurs, rubs or bruits Abdomen: Soft, non tender and non distended. No masses, hepatosplenomegaly or hernias noted. Normal Bowel sounds Musculoskeletal: Symmetrical with no gross deformities  Pulses:  Normal pulses noted Extremities: No clubbing, cyanosis, edema or deformities noted Neurological: Alert oriented x 4, grossly nonfocal Psychological:  Alert and cooperative. Normal mood and affect  Assessment and Recommendations:  1. GERD. Possible LPR or sinus/allergy related symptoms. Change to pantoprazole 40 mg twice daily and continue standard antireflux measures long-term. If her cough does not respond to this regimen we plan to change back to omeprazole 20 mg twice daily.  2. Personal history of adenomatous colon polyps and immediate family member with colon cancer.Colonoscopy performed in 05/2013. No plans for future surveillance colonoscopies due to age.

## 2014-07-07 ENCOUNTER — Telehealth: Payer: Self-pay | Admitting: Gastroenterology

## 2014-07-07 MED ORDER — OMEPRAZOLE 20 MG PO CPDR: 20.0000 mg | DELAYED_RELEASE_CAPSULE | Freq: Two times a day (BID) | ORAL | Status: DC

## 2014-07-07 NOTE — Telephone Encounter (Signed)
Per last office note can go back to omeprazole 20 mg twice daily if needed.  Prescription sent

## 2014-07-25 ENCOUNTER — Ambulatory Visit (INDEPENDENT_AMBULATORY_CARE_PROVIDER_SITE_OTHER): Payer: Medicare Other | Admitting: Internal Medicine

## 2014-07-25 ENCOUNTER — Encounter: Payer: Self-pay | Admitting: Internal Medicine

## 2014-07-25 VITALS — BP 116/66 | HR 67 | Temp 98.0°F | Resp 10 | Ht 64.0 in | Wt 176.0 lb

## 2014-07-25 DIAGNOSIS — M1711 Unilateral primary osteoarthritis, right knee: Secondary | ICD-10-CM

## 2014-07-25 DIAGNOSIS — M8580 Other specified disorders of bone density and structure, unspecified site: Secondary | ICD-10-CM

## 2014-07-25 DIAGNOSIS — E785 Hyperlipidemia, unspecified: Secondary | ICD-10-CM

## 2014-07-25 DIAGNOSIS — M778 Other enthesopathies, not elsewhere classified: Secondary | ICD-10-CM

## 2014-07-25 DIAGNOSIS — Z23 Encounter for immunization: Secondary | ICD-10-CM

## 2014-07-25 DIAGNOSIS — M7581 Other shoulder lesions, right shoulder: Secondary | ICD-10-CM

## 2014-07-25 DIAGNOSIS — K219 Gastro-esophageal reflux disease without esophagitis: Secondary | ICD-10-CM

## 2014-07-25 DIAGNOSIS — J309 Allergic rhinitis, unspecified: Secondary | ICD-10-CM

## 2014-07-25 MED ORDER — DICLOFENAC SODIUM 1 % TD GEL: TRANSDERMAL | Status: DC

## 2014-07-25 NOTE — Progress Notes (Signed)
Patient ID: Rebecca Guzman, female   DOB: 1934/08/07, 80 y.o.   MRN: [id number]   Location:  [facility]  Code Status: we still do not have her living will  Allergies  Allergen Reactions  . Codeine   . Morphine And Related     Nausea, and drop in blood pressure    Chief Complaint  Patient presents with  . Medical Management of Chronic Issues    6 month follow-up, not fasting today   . Arthritis    Discuss arthritis pain management     HPI: Patient is a 80 y.o. female seen in the office today for 6 month f/u appt.  Had more allergies this fall.  Too much rain with leaf mold.  No colds.  Bones ache more with age.  Right knee pain has gotten worse--osteoarthritis.  Back pain she's had for years has gotten worse.  Has scoliosis.  Real low back stays sore--she thinks it's more her muscles than joints.  Read about vitamin D excess can cause muscle pains and soreness.  Takes 2000 units daily.  Right shoulder is also sore.  Has to be careful if she reaches back due to pain.    Still hasn't had her tdap vaccine.  Otherwise up to date other than pneumococcal she can get today.  Can only use ibuprofen 1-2 days before it bothers her stomach.  Tylenol 2 each morning doesn't do much good.  Does use biofreeze on her knee.    Saw Dr. Fuller Plan and no longer has to see him.  Has one year's refills of prilosec.    Review of Systems:  Review of Systems  Constitutional: Negative for fever and malaise/fatigue.  HENT: Negative for congestion.   Eyes: Negative for blurred vision.  Respiratory: Negative for shortness of breath.   Cardiovascular: Negative for chest pain and leg swelling.  Gastrointestinal: Negative for abdominal pain, constipation, blood in stool and melena.  Genitourinary: Negative for dysuria, urgency and frequency.  Musculoskeletal: Positive for myalgias, back pain and joint pain. Negative for falls.  Skin: Negative for rash.  Neurological:  Negative for dizziness, loss of consciousness and weakness.  Endo/Heme/Allergies: Positive for environmental allergies.  Psychiatric/Behavioral: Negative for depression and memory loss.     Past Medical History  Diagnosis Date  . GERD (gastroesophageal reflux disease)   . Uterine prolapse     s/p hysterectomy  . Breast cancer 1992  . Asthma   . Osteoarthritis of right knee   . Colon polyps     Dr. Lindalou Hose in [place]  . Gallstones   . Hyperlipidemia LDL goal <100   . Bladder prolapse, female, acquired   . Allergic sinusitis   . Tubular adenoma of colon   . Hyperplastic colon polyp     Past Surgical History  Procedure Laterality Date  . Pelvic floor repair    . Laparoscopic assisted vaginal hysterectomy    . Cholecystectomy  1985    Dr. Lindalou Hose (retired)   . Mastectomy  1992    Left  . Colonoscopy  2009    Social History:   reports that she quit smoking about 30 years ago. Her smoking use included Cigarettes. She smoked 0.00 packs per day for 13 years. She has never used smokeless tobacco. She reports that she does not drink alcohol or use illicit drugs.  Family History  Problem Relation Age of Onset  . Heart attack Mother   . Colon cancer Mother   .  Hypertension Mother   . Heart disease Father   . Breast cancer Sister     Medications: Patient's Medications  New Prescriptions   No medications on file  Previous Medications   ACETAMINOPHEN (TYLENOL) 500 MG TABLET    Take 500 mg by mouth every 6 (six) hours as needed.   ALBUTEROL (PROVENTIL HFA;VENTOLIN HFA) 108 (90 BASE) MCG/ACT INHALER    Inhale 2 puffs into the lungs as needed for wheezing.   CALCIUM CARBONATE-VITAMIN D (CALTRATE 600+D PO)    Take 2 tablets by mouth daily. Getting 2000 of vit D per day   FEXOFENADINE (ALLEGRA) 180 MG TABLET    Take 180 mg by mouth daily.   FLUTICASONE (FLONASE) 50 MCG/ACT NASAL SPRAY       MULTIPLE VITAMIN (MULTIVITAMIN) TABLET    Take 1 tablet by mouth daily.   OMEPRAZOLE  (PRILOSEC) 20 MG CAPSULE    Take 1 capsule (20 mg total) by mouth 2 (two) times daily before a meal.   PRAVASTATIN (PRAVACHOL) 40 MG TABLET    Take 1 tablet (40 mg total) by mouth daily.   PROBIOTIC PRODUCT (PROBIOTIC FORMULA PO)    Take 1 tablet by mouth daily.   TDAP (BOOSTRIX) 5-2.5-18.5 LF-MCG/0.5 INJECTION    Inject 0.5 mLs into the muscle once.  Modified Medications   No medications on file  Discontinued Medications   PANTOPRAZOLE (PROTONIX) 40 MG TABLET    Take 1 tablet (40 mg total) by mouth 2 (two) times daily.     Physical Exam: Filed Vitals:   07/25/14 0935  BP: 116/66  Pulse: 67  Temp: 98 F (36.7 C)  TempSrc: Oral  Resp: 10  Height: 5\' 4"  (1.626 m)  Weight: 176 lb (79.833 kg)  SpO2: 97%  Physical Exam  Constitutional: She is oriented to person, place, and time. She appears well-developed and well-nourished. No distress.  Does not use assistive device  Cardiovascular: Normal rate, regular rhythm, normal heart sounds and intact distal pulses.   Pulmonary/Chest: Effort normal and breath sounds normal. No respiratory distress.  Abdominal: Soft. Bowel sounds are normal. She exhibits no distension and no mass. There is no tenderness.  Musculoskeletal: She exhibits edema and tenderness.  Of right medial lower aspect of knee with small effusion present, mild crepitus, normal gait; right shoulder painful with internal rotation over biceps tendon insertion  Neurological: She is alert and oriented to person, place, and time.  Skin: Skin is warm and dry.  Psychiatric: She has a normal mood and affect.     Labs reviewed: Basic Metabolic Panel:  Recent Labs  01/15/14 01/15/14 0816 02/20/14 0939  NA  --  140  --   K  --  4.3  --   CL  --  101  --   CO2  --  23  --   GLUCOSE  --  92  --   BUN  --  14  --   CREATININE  --  0.84  --   CALCIUM  --  9.2  --   TSH 5.20 5.200* 3.820   Liver Function Tests:  Recent Labs  01/15/14 0816  AST 20  ALT 15  ALKPHOS 76    BILITOT 0.5  PROT 6.6   No results for input(s): LIPASE, AMYLASE in the last 8760 hours. No results for input(s): AMMONIA in the last 8760 hours. CBC:  Recent Labs  01/15/14 0816  WBC 6.9  NEUTROABS 4.2  HGB 13.5  HCT 38.6  MCV 88  Lipid Panel:  Recent Labs  01/15/14 0816  HDL 54  LDLCALC 79  TRIG 187*  CHOLHDL 3.1   Assessment/Plan 1. Right shoulder tendonitis - has been refractory to ibuprofen and tylenol and she cannot tolerate taking nsaids orally for more than a day or two -will try therapy and voltaren and see how well this goes -if she does not improve, will refer to orthopedics for additional imaging and shoulder injection - diclofenac sodium (VOLTAREN) 1 % GEL; Apply 4 grams to right knee and 2 grams to right shoulder up to four times daily as needed for arthritis pain  Dispense: 100 g; Refill: 5 - Ambulatory referral to Physical Therapy - CBC With differential/Platelet; Future  2. Primary osteoarthritis of right knee - will refer for therapy for exercises and try voltaren gel  - diclofenac sodium (VOLTAREN) 1 % GEL; Apply 4 grams to right knee and 2 grams to right shoulder up to four times daily as needed for arthritis pain  Dispense: 100 g; Refill: 5 - Ambulatory referral to Physical Therapy - CBC With differential/Platelet; Future  3. Hyperlipidemia LDL goal <100 - cont pravachol - CBC With differential/Platelet; Future - Comprehensive metabolic panel; Future - Lipid panel; Future - Hemoglobin A1c; Future  4. Osteopenia - cont vitamin D and weightbearing exercise--reassured that she is not taking too much vitamin D - CBC With differential/Platelet; Future  5. Gastroesophageal reflux disease, esophagitis presence not specified - cont prilosec - CBC With differential/Platelet; Future  6. Allergic sinusitis -cont flonase and allegra  - CBC With differential/Platelet; Future  7. Need for 23-polyvalent pneumococcal polysaccharide vaccine -  Pneumococcal polysaccharide vaccine 23-valent greater than or equal to 2yo subcutaneous/IM given  Labs/tests ordered:   Orders Placed This Encounter  Procedures  . CBC With differential/Platelet    Standing Status: Future     Number of Occurrences:      Standing Expiration Date: 07/26/2015  . Comprehensive metabolic panel    Standing Status: Future     Number of Occurrences:      Standing Expiration Date: 07/26/2015    Order Specific Question:  Has the patient fasted?    Answer:  Yes  . Lipid panel    Standing Status: Future     Number of Occurrences:      Standing Expiration Date: 07/26/2015    Order Specific Question:  Has the patient fasted?    Answer:  Yes  . Hemoglobin A1c    Standing Status: Future     Number of Occurrences:      Standing Expiration Date: 07/26/2015  . Ambulatory referral to Physical Therapy    Referral Priority:  Routine    Referral Type:  Physical Medicine    Referral Reason:  Specialty Services Required    Requested Specialty:  Physical Therapy    Number of Visits Requested:  1    Next appt:  Annual exam with labs before  Frederick Lakena Sparlin, D.O. [facility] [address],  [address] Cell Phone (Mon-Fri 8am-5pm):  [phone] On Call:  [phone] & follow prompts after 5pm & weekends Office Phone:  [phone] Office Fax:  [phone]

## 2014-08-19 ENCOUNTER — Ambulatory Visit: Payer: Medicare Other | Attending: Internal Medicine | Admitting: Physical Therapy

## 2014-08-19 ENCOUNTER — Telehealth: Payer: Self-pay | Admitting: *Deleted

## 2014-08-19 DIAGNOSIS — M6281 Muscle weakness (generalized): Secondary | ICD-10-CM | POA: Diagnosis not present

## 2014-08-19 DIAGNOSIS — M25511 Pain in right shoulder: Secondary | ICD-10-CM | POA: Diagnosis not present

## 2014-08-19 DIAGNOSIS — M25561 Pain in right knee: Secondary | ICD-10-CM | POA: Diagnosis not present

## 2014-08-19 NOTE — Therapy (Signed)
[place], [place], [address] Phone: [phone]   Fax:  [phone]  Physical Therapy Evaluation  Patient Details  Name: Rebecca Guzman MRN: [id number] Date of Birth: [birth year] Referring Provider:  [name], DO  Encounter Date: 08/19/2014      PT End of Session - 08/19/14 1244    Visit Number 1   Number of Visits 16   Date for PT Re-Evaluation 10/18/14   PT Start Time 0930   PT Stop Time 1012   PT Time Calculation (min) 42 min   Activity Tolerance Patient tolerated treatment well   Behavior During Therapy [facility] for tasks assessed/performed      Past Medical History  Diagnosis Date  . GERD (gastroesophageal reflux disease)   . Uterine prolapse     s/p hysterectomy  . Breast cancer 1992  . Asthma   . Osteoarthritis of right knee   . Colon polyps     Dr. Lindalou Hose in [place]  . Gallstones   . Hyperlipidemia LDL goal <100   . Bladder prolapse, female, acquired   . Allergic sinusitis   . Tubular adenoma of colon   . Hyperplastic colon polyp     Past Surgical History  Procedure Laterality Date  . Pelvic floor repair    . Laparoscopic assisted vaginal hysterectomy    . Cholecystectomy  1985    Dr. Lindalou Hose (retired)   . Mastectomy  1992    Left  . Colonoscopy  2009    LMP 11/21/1985  Visit Diagnosis:  Right shoulder pain - Plan: PT plan of care cert/re-cert  Right knee pain - Plan: PT plan of care cert/re-cert  Muscle weakness (generalized) - Plan: PT plan of care cert/re-cert      Subjective Assessment - 08/19/14 0934    Symptoms Pt is a 81 y/o female who presents to OPPT for R shoulder and R knee pain.  Pt reports symptoms began 2-3 years with gradual onset.  Pt reports shoulder pain has improved but has limited use of UE.   Limitations Lifting;House hold activities   How long can you walk comfortably? "when I don't walk it gets worse"   Patient Stated Goals decrease pain,  improve shoulder mobility and strength in knees/shoulders   Currently in Pain? Yes   Pain Score 0-No pain  up to 8/10   Pain Location Shoulder   Pain Orientation Right   Pain Descriptors / Indicators Aching   Pain Type Chronic pain   Pain Onset More than a month ago   Pain Frequency Intermittent   Aggravating Factors  overhead lifting/reaching   Pain Relieving Factors avoiding movements   Multiple Pain Sites Yes   Pain Score 0  up to 8-9/10 with walking   Pain Type Chronic pain   Pain Location Knee   Pain Orientation Right   Pain Descriptors / Indicators Aching   Pain Frequency Intermittent   Pain Onset With Activity          [facility] PT Assessment - 08/19/14 0942    Assessment   Medical Diagnosis R shoulder/R knee OA   Next MD Visit June 2016   Prior Therapy none   Precautions   Precautions None   Precaution Comments no BP left arm   Restrictions   Weight Bearing Restrictions No   Balance Screen   Has the patient fallen in the past 6 months No   Has the patient had a decrease in activity level because  of a fear of falling?  No   Is the patient reluctant to leave their home because of a fear of falling?  No   Home Environment   Living Enviornment Private residence   Living Arrangements Children  daughter   Available Help at Discharge Available PRN/intermittently   Type of [place] to enter   Entrance Stairs-Number of Steps 5   Entrance Stairs-Rails Right   Prior Function   Level of Independence Independent with gait;Independent with transfers;Independent with basic ADLs   Vocation Retired   Biomedical scientist retired from [employer]   Overall Cognitive Status Within [place] for tasks assessed   Observation/Other Assessments   Focus on Therapeutic Outcomes (FOTO)  37% limited; predicted 39% limited   Posture/Postural Control   Posture/Postural Control Postural limitations   Postural Limitations Rounded  Shoulders;Forward head;Increased thoracic kyphosis  mild   AROM   Right Shoulder Flexion 156 Degrees  ERP   Right Shoulder ABduction 142 Degrees  ERP   Right Shoulder Internal Rotation --  to T12 with pulling sensation   Right Shoulder External Rotation --  to back of head with pain   Right Knee Extension 5   Right Knee Flexion 121   Strength   Right Shoulder Flexion 4/5   Right Shoulder ABduction 4/5   Right Shoulder Internal Rotation 5/5   Right Shoulder External Rotation 3+/5   Right Hip Flexion 4/5   Right Hip Extension 3/5   Right Hip ABduction 3+/5   Right Hip ADduction 3+/5   Right Knee Flexion 4/5   Right Knee Extension 5/5                            PT Short Term Goals - 08/19/14 1247    PT SHORT TERM GOAL #1   Title independent with HEP (09/16/14)   Time 4   Period Weeks   Status New   PT SHORT TERM GOAL #2   Title perform UE ROM without pain (09/16/14)   Time 4   Period Weeks   Status New   PT SHORT TERM GOAL #3   Title report pain < 5/10 for improved mobility (09/16/14)   Time 4   Period Weeks   Status New           PT Long Term Goals - 08/19/14 1248    PT LONG TERM GOAL #1   Title report joining fitness center with [employer] and attending at least 1-2x/week (10/14/14)   Time 8   Period Weeks   Status New   PT LONG TERM GOAL #2   Title report pain < 3/10 with activity for improved mobility (10/14/14)   Time 4   Period Weeks   Status New   PT LONG TERM GOAL #3   Title perform overhead reach with 2# without increase in pain for improved strength (10/14/14)   Time 8   Period Weeks   Status New               Plan - 08/19/14 1245    Clinical Impression Statement Pt reports to OPPT for R shoulder and R knee pain as well as decreased strength RUE and RLE.  Will benefit from PT to maximize function and improve strength with transition to community fitness.   Pt will benefit from skilled therapeutic intervention in order to  improve on the following deficits Decreased range of  motion;Postural dysfunction;Pain;Decreased strength;Decreased mobility   Rehab Potential Good   PT Frequency 2x / week   PT Duration 8 weeks   PT Treatment/Interventions ADLs/Self Care Home Management;Cryotherapy;Electrical Stimulation;Functional mobility training;Neuromuscular re-education;Ultrasound;Gait training;Balance training;Manual techniques;Passive range of motion;Therapeutic exercise;Moist Heat;Therapeutic activities;Patient/family education   PT Next Visit Plan HEP for UE strength/posture and RLE strength   Consulted and Agree with Plan of Care Patient          G-Codes - 29-Aug-2014 1250    Functional Assessment Tool Used FOTO 37% limited   Functional Limitation Mobility: Walking and moving around   Mobility: Walking and Moving Around Current Status (F0932) At least 20 percent but less than 40 percent impaired, limited or restricted   Mobility: Walking and Moving Around Goal Status (T5573) At least 20 percent but less than 40 percent impaired, limited or restricted       Problem List Patient Active Problem List   Diagnosis Date Noted  . Other malaise and fatigue 01/17/2014  . Osteopenia 01/17/2014  . Hx of adenomatous colonic polyps 07/11/2013  . Osteoarthritis of right knee   . Hyperlipidemia LDL goal <100   . GERD (gastroesophageal reflux disease)   . Bladder prolapse, female, acquired   . Allergic sinusitis   . Esophageal reflux 10/12/2011   Laureen Abrahams, PT, DPT 08-29-2014 12:52 PM  [facility] [address] [place], [place], [address] Phone: [phone]   Fax:  [phone]

## 2014-08-19 NOTE — Telephone Encounter (Signed)
appts made and printed...td 

## 2014-08-26 ENCOUNTER — Encounter: Payer: Medicare Other | Admitting: Rehabilitation

## 2014-08-27 ENCOUNTER — Telehealth: Payer: Self-pay | Admitting: *Deleted

## 2014-08-27 NOTE — Telephone Encounter (Signed)
Pt called to cancel all of her appts due to her income...td

## 2014-08-28 ENCOUNTER — Ambulatory Visit: Payer: Medicare Other | Admitting: Physical Therapy

## 2014-09-02 ENCOUNTER — Encounter: Payer: Medicare Other | Admitting: Rehabilitation

## 2014-09-02 ENCOUNTER — Encounter: Payer: Self-pay | Admitting: Physical Therapy

## 2014-09-02 NOTE — Therapy (Signed)
[place], [place], [address] Phone: [phone]   Fax:  [phone]  Patient Details  Name: Rebecca Guzman MRN: [id number] Date of Birth: 09/23/1933 Referring Provider:  No ref. provider found  Encounter Date: 09/02/2014  PHYSICAL THERAPY DISCHARGE SUMMARY  Visits from Start of Care: 1  Current functional level related to goals / functional outcomes:  PT LONG TERM GOAL #1    Title  report joining fitness center with [employer] and attending at least 1-2x/week (3/8/ 16)    Time  8    Period  Weeks    Status  New    PT LONG TERM GOAL #2    Title  report pain < 3/10 with activity for improved mobility (10/14/14)    Time  4    Period  Weeks    Status  New    PT LONG TERM GOAL #3    Title  perform overhead reach with 2# without increase in pain for improved strength (10/14/14)    Time  8    Period  Weeks    Status  New       Remaining deficits: See initial evaluation, pt did not return   Education / Equipment: n/a  Plan: Patient agrees to discharge.  Patient goals were not met. Patient is being discharged due to financial reasons.  ?????       Pt canceled all appts due to finances.  Laureen Abrahams, PT, DPT 09/02/2014 8:14 AM  [facility] [address] [place], [place], [address] Phone: [phone]   Fax:  [phone]

## 2014-09-04 ENCOUNTER — Encounter: Payer: Medicare Other | Admitting: Physical Therapy

## 2014-09-23 ENCOUNTER — Other Ambulatory Visit: Payer: Self-pay | Admitting: Internal Medicine

## 2014-10-06 ENCOUNTER — Encounter: Payer: Self-pay | Admitting: Internal Medicine

## 2014-10-06 ENCOUNTER — Ambulatory Visit (INDEPENDENT_AMBULATORY_CARE_PROVIDER_SITE_OTHER): Payer: Medicare Other | Admitting: Internal Medicine

## 2014-10-06 VITALS — BP 124/76 | HR 90 | Temp 98.5°F | Resp 18 | Ht 64.0 in | Wt 177.6 lb

## 2014-10-06 DIAGNOSIS — K2100 Gastro-esophageal reflux disease with esophagitis, without bleeding: Secondary | ICD-10-CM

## 2014-10-06 NOTE — Patient Instructions (Signed)

## 2014-10-06 NOTE — Progress Notes (Signed)
Patient ID: Rebecca Guzman, female   DOB: 03-10-1934, 81 y.o.   MRN: [id number]   Location:  [facility]   Allergies  Allergen Reactions  . Codeine   . Morphine And Related     Nausea, and drop in blood pressure    Chief Complaint  Patient presents with  . Acute Visit    dull ache, SOB,    HPI: Patient is a 81 y.o. white female seen in the office today for acute visit due to dull ache on left side, shortness of breath and nausea.  Says it's ben going on about a month.  She changed her probiotic (daughter bought a different one, but has stopped these capsules and gone back to chewables as of 3 days ago).  Sore just below left breast in soft part of stomach.  Took mylanta that one night and it stopped it.  Ever since she's had acid reflux, she's had some soreness in the esophagus relieved with mylanta or milk.  Now has been getting dull ache that goes around to her back.  Already on prilosec twice daily before meals.  Had EGD a few years ago at which time she had h pylori and was treated with abx.    Review of Systems:  Review of Systems  Constitutional: Positive for malaise/fatigue.       Says she has not been exercising  Respiratory: Positive for cough. Negative for shortness of breath.   Cardiovascular: Positive for chest pain. Negative for palpitations, orthopnea and leg swelling.  Gastrointestinal: Positive for heartburn, nausea and abdominal pain. Negative for vomiting, constipation, blood in stool and melena.  Musculoskeletal: Negative for myalgias and falls.  Neurological: Negative for weakness.  Psychiatric/Behavioral: Negative for memory loss.    Past Medical History  Diagnosis Date  . GERD (gastroesophageal reflux disease)   . Uterine prolapse     s/p hysterectomy  . Breast cancer 1992  . Asthma   . Osteoarthritis of right knee   . Colon polyps     Dr. Lindalou Hose in [place]  . Gallstones   . Hyperlipidemia LDL goal <100   .  Bladder prolapse, female, acquired   . Allergic sinusitis   . Tubular adenoma of colon   . Hyperplastic colon polyp     Past Surgical History  Procedure Laterality Date  . Pelvic floor repair    . Laparoscopic assisted vaginal hysterectomy    . Cholecystectomy  1985    Dr. Lindalou Hose (retired)   . Mastectomy  1992    Left  . Colonoscopy  2009    Social History:   reports that she quit smoking about 31 years ago. Her smoking use included Cigarettes. She quit after 13 years of use. She has never used smokeless tobacco. She reports that she does not drink alcohol or use illicit drugs.  Family History  Problem Relation Age of Onset  . Heart attack Mother   . Colon cancer Mother   . Hypertension Mother   . Heart disease Father   . Breast cancer Sister     Medications: Patient's Medications  New Prescriptions   No medications on file  Previous Medications   ACETAMINOPHEN (TYLENOL) 500 MG TABLET    Take 500 mg by mouth every 6 (six) hours as needed.   ALBUTEROL (PROVENTIL HFA;VENTOLIN HFA) 108 (90 BASE) MCG/ACT INHALER    Inhale 2 puffs into the lungs as needed for wheezing.   CALCIUM CARBONATE-VITAMIN D (CALTRATE 600+D  PO)    Take 2 tablets by mouth daily. Getting 2000 of vit D per day   DICLOFENAC SODIUM (VOLTAREN) 1 % GEL    Apply 4 grams to right knee and 2 grams to right shoulder up to four times daily as needed for arthritis pain   FEXOFENADINE (ALLEGRA) 180 MG TABLET    Take 180 mg by mouth daily.   FLUTICASONE (FLONASE) 50 MCG/ACT NASAL SPRAY       MULTIPLE VITAMIN (MULTIVITAMIN) TABLET    Take 1 tablet by mouth daily.   OMEPRAZOLE (PRILOSEC) 20 MG CAPSULE    Take 1 capsule (20 mg total) by mouth 2 (two) times daily before a meal.   PRAVASTATIN (PRAVACHOL) 40 MG TABLET    take 1 tablet by mouth once daily   PROBIOTIC PRODUCT (PROBIOTIC FORMULA PO)    Take 1 tablet by mouth daily.   TDAP (BOOSTRIX) 5-2.5-18.5 LF-MCG/0.5 INJECTION    Inject 0.5 mLs into the muscle once.    Modified Medications   No medications on file  Discontinued Medications   No medications on file     Physical Exam: Filed Vitals:   10/06/14 1511  BP: 124/76  Pulse: 90  Temp: 98.5 F (36.9 C)  TempSrc: Oral  Resp: 18  Height: 5\' 4"  (1.626 m)  Weight: 177 lb 9.6 oz (80.559 kg)  SpO2: 95%  Physical Exam  Constitutional: She is oriented to person, place, and time. She appears well-developed and well-nourished.  Cardiovascular: Normal rate, regular rhythm, normal heart sounds and intact distal pulses.   Pulmonary/Chest: Effort normal and breath sounds normal.  Abdominal: Soft. Bowel sounds are normal. There is tenderness.  Mild tenderness over stomach  Neurological: She is alert and oriented to person, place, and time.    Labs reviewed: Basic Metabolic Panel:  Recent Labs  01/15/14 01/15/14 0816 02/20/14 0939  NA  --  140  --   K  --  4.3  --   CL  --  101  --   CO2  --  23  --   GLUCOSE  --  92  --   BUN  --  14  --   CREATININE  --  0.84  --   CALCIUM  --  9.2  --   TSH 5.20 5.200* 3.820   Liver Function Tests:  Recent Labs  01/15/14 0816  AST 20  ALT 15  ALKPHOS 76  BILITOT 0.5  PROT 6.6   No results for input(s): LIPASE, AMYLASE in the last 8760 hours. No results for input(s): AMMONIA in the last 8760 hours. CBC:  Recent Labs  01/15/14 0816  WBC 6.9  NEUTROABS 4.2  HGB 13.5  HCT 38.6  MCV 88   Lipid Panel:  Recent Labs  01/15/14 0816  CHOL 170  HDL 54  LDLCALC 79  TRIG 187*  CHOLHDL 3.1  EKG sinus rhythm at 84bpm  Assessment/Plan  1. Gastroesophageal reflux disease with esophagitis -reviewed conservative measures for this b/c she has been eating liberally  -also will check  H. pylori Antibody, IgG--she was previously treated for that by Dr. Fuller Plan, et al.   -if positive, will treat -if negative and symptoms ongoing with dietary changes, will send back to Dr. Fuller Plan  Labs/tests ordered: Orders Placed This Encounter  Procedures   . H. pylori Antibody, IgG    Next appt:  Keep appt in June, return prn if symptoms worsen or are associated with activity  Oziel Beitler L. Polo Mcmartin, D.O. [facility] [employer]  [facility] [address], [place] [address] Cell Phone (Mon-Fri 8am-5pm):  [phone] On Call:  [phone] & follow prompts after 5pm & weekends Office Phone:  [phone] Office Fax:  [phone]

## 2014-10-07 LAB — H. PYLORI ANTIBODY, IGG: H Pylori IgG: <0.9 U/mL (ref 0.0–0.8)

## 2014-10-15 ENCOUNTER — Encounter: Payer: Self-pay | Admitting: Internal Medicine

## 2014-11-10 ENCOUNTER — Other Ambulatory Visit (INDEPENDENT_AMBULATORY_CARE_PROVIDER_SITE_OTHER): Payer: Medicare Other

## 2014-11-10 ENCOUNTER — Ambulatory Visit (INDEPENDENT_AMBULATORY_CARE_PROVIDER_SITE_OTHER): Payer: Medicare Other | Admitting: Gastroenterology

## 2014-11-10 ENCOUNTER — Encounter: Payer: Self-pay | Admitting: Gastroenterology

## 2014-11-10 VITALS — BP 128/76 | HR 72 | Ht 64.0 in | Wt 175.4 lb

## 2014-11-10 DIAGNOSIS — R079 Chest pain, unspecified: Secondary | ICD-10-CM

## 2014-11-10 DIAGNOSIS — K219 Gastro-esophageal reflux disease without esophagitis: Secondary | ICD-10-CM

## 2014-11-10 DIAGNOSIS — R1013 Epigastric pain: Secondary | ICD-10-CM

## 2014-11-10 DIAGNOSIS — E785 Hyperlipidemia, unspecified: Secondary | ICD-10-CM

## 2014-11-10 LAB — COMPREHENSIVE METABOLIC PANEL
ALK PHOS: 69 U/L (ref 39–117)
ALT: 13 U/L (ref 0–35)
AST: 17 U/L (ref 0–37)
Albumin: 4.1 g/dL (ref 3.5–5.2)
BUN: 15 mg/dL (ref 6–23)
CHLORIDE: 106 meq/L (ref 96–112)
CO2: 28 mEq/L (ref 19–32)
Calcium: 9.5 mg/dL (ref 8.4–10.5)
Creatinine, Ser: 0.82 mg/dL (ref 0.40–1.20)
GFR: 71.44 mL/min (ref >60.00)
GLUCOSE: 84 mg/dL (ref 70–99)
POTASSIUM: 5 meq/L (ref 3.5–5.1)
SODIUM: 138 meq/L (ref 135–145)
TOTAL PROTEIN: 7 g/dL (ref 6.0–8.3)
Total Bilirubin: 0.6 mg/dL (ref 0.2–1.2)

## 2014-11-10 LAB — CBC WITH DIFFERENTIAL/PLATELET
BASOS PCT: 0.5 % (ref 0.0–3.0)
Basophils Absolute: 0 10*3/uL (ref 0.0–0.1)
EOS PCT: 2 % (ref 0.0–5.0)
Eosinophils Absolute: 0.1 10*3/uL (ref 0.0–0.7)
HEMATOCRIT: 39.7 % (ref 36.0–46.0)
HEMOGLOBIN: 13.7 g/dL (ref 12.0–15.0)
LYMPHS ABS: 2 10*3/uL (ref 0.7–4.0)
Lymphocytes Relative: 27.4 % (ref 12.0–46.0)
MCHC: 34.6 g/dL (ref 30.0–36.0)
MCV: 87.3 fl (ref 78.0–100.0)
Monocytes Absolute: 0.6 10*3/uL (ref 0.1–1.0)
Monocytes Relative: 8.1 % (ref 3.0–12.0)
Neutro Abs: 4.5 10*3/uL (ref 1.4–7.7)
Neutrophils Relative %: 62 % (ref 43.0–77.0)
Platelets: 227 10*3/uL (ref 150.0–400.0)
RBC: 4.55 Mil/uL (ref 3.87–5.11)
RDW: 13.4 % (ref 11.5–15.5)
WBC: 7.2 10*3/uL (ref 4.0–10.5)

## 2014-11-10 LAB — LIPASE: LIPASE: 19 U/L (ref 11.0–59.0)

## 2014-11-10 LAB — TSH: TSH: 3.03 u[IU]/mL (ref 0.35–4.50)

## 2014-11-10 MED ORDER — OMEPRAZOLE 40 MG PO CPDR: 40.0000 mg | DELAYED_RELEASE_CAPSULE | Freq: Two times a day (BID) | ORAL | Status: DC

## 2014-11-10 NOTE — Progress Notes (Signed)
History of Present Illness: This is a 81 year old female who relates the onset of epigastric pain left chest and left back pain starting in February. Her symptoms have dramatically improved. She notes her symptoms are often brought on by meals. She has a history of GERD and states her heartburn symptoms have been well controlled on omeprazole 20 mg twice daily. Denies weight loss, constipation, diarrhea, change in stool caliber, melena, hematochezia, nausea, vomiting, dysphagia, reflux symptoms.   Allergies  Allergen Reactions  . Codeine   . Morphine And Related     Nausea, and drop in blood pressure   Outpatient Prescriptions Prior to Visit  Medication Sig Dispense Refill  . acetaminophen (TYLENOL) 500 MG tablet Take 500 mg by mouth every 6 (six) hours as needed.    Marland Kitchen albuterol (PROVENTIL HFA;VENTOLIN HFA) 108 (90 BASE) MCG/ACT inhaler Inhale 2 puffs into the lungs as needed for wheezing.    . Calcium Carbonate-Vitamin D (CALTRATE 600+D PO) Take 2 tablets by mouth daily. Getting 2000 of vit D per day    . diclofenac sodium (VOLTAREN) 1 % GEL Apply 4 grams to right knee and 2 grams to right shoulder up to four times daily as needed for arthritis pain 100 g 5  . fexofenadine (ALLEGRA) 180 MG tablet Take 180 mg by mouth daily.    . fluticasone (FLONASE) 50 MCG/ACT nasal spray     . Multiple Vitamin (MULTIVITAMIN) tablet Take 1 tablet by mouth daily.    Marland Kitchen omeprazole (PRILOSEC) 20 MG capsule Take 1 capsule (20 mg total) by mouth 2 (two) times daily before a meal. 90 capsule 3  . pravastatin (PRAVACHOL) 40 MG tablet take 1 tablet by mouth once daily 90 tablet 3  . Probiotic Product (PROBIOTIC FORMULA PO) Take 1 tablet by mouth daily.    . Tdap (BOOSTRIX) 5-2.5-18.5 LF-MCG/0.5 injection Inject 0.5 mLs into the muscle once. 0.5 mL 0   No facility-administered medications prior to visit.   Past Medical History  Diagnosis Date  . GERD (gastroesophageal reflux disease)   . Uterine prolapse       s/p hysterectomy  . Breast cancer 1992  . Asthma   . Osteoarthritis of right knee   . Colon polyps     Dr. Lindalou Hose in [place]  . Gallstones   . Hyperlipidemia LDL goal <100   . Bladder prolapse, female, acquired   . Allergic sinusitis   . Tubular adenoma of colon   . Hyperplastic colon polyp    Past Surgical History  Procedure Laterality Date  . Pelvic floor repair    . Laparoscopic assisted vaginal hysterectomy    . Cholecystectomy  1985    Dr. Lindalou Hose (retired)   . Mastectomy  1992    Left  . Colonoscopy  2009   History   Social History  . Marital Status: Widowed    Spouse Name: N/A  . Number of Children: 4  . Years of Education: N/A   Occupational History  . Retired Pharmacist, hospital    Social History Main Topics  . Smoking status: Former Smoker -- 13 years    Types: Cigarettes    Quit date: 10/12/1983  . Smokeless tobacco: Never Used  . Alcohol Use: No  . Drug Use: No  . Sexual Activity: Not Currently   Other Topics Concern  . None   Social History Narrative   Family History  Problem Relation Age of Onset  . Heart attack Mother   . Colon cancer Mother   .  Hypertension Mother   . Heart disease Father   . Breast cancer Sister       Physical Exam: General: Well developed , well nourished, no acute distress Head: Normocephalic and atraumatic Eyes:  sclerae anicteric, EOMI Ears: Normal auditory acuity Mouth: No deformity or lesions Lungs: Clear throughout to auscultation Heart: Regular rate and rhythm; no murmurs, rubs or bruits Abdomen: Soft, minimal epigastric tenderness and non distended. No masses, hepatosplenomegaly or hernias noted. Normal Bowel sounds Back: Mild tenderness below her left scapula Musculoskeletal: Symmetrical with no gross deformities  Pulses:  Normal pulses noted Extremities: No clubbing, cyanosis, edema or deformities noted Neurological: Alert oriented x 4, grossly nonfocal Psychological:  Alert and cooperative. Normal mood and  affect  Assessment and Recommendations:  1. Epigastric pain, left chest pain, left back pain. Symptoms began in February and have substantially improved with no specific treatment. Her symptoms have musculoskeletal and gastrointestinal features. Obtain CBC, lipase, CMET. Increase omeprazole to 40 mg twice daily. Consider chest/abdomen CT scan if symptoms persist. REV one month.

## 2014-11-10 NOTE — Patient Instructions (Addendum)
Your physician has requested that you go to the basement for the following lab work before leaving today:Cmet, CBC, Lipase and TSH.  Finish taking your omeprazole 2 20 mg tablets twice daily. I sent a new prescription to your pharmacy for omeprazole 40 mg one tablet my mouth twice daily.  Your follow up appointment with Dr. Fuller Plan is on 12/16/14 at 9:45am.   Thank you for choosing me and [facility].  Pricilla Riffle. Dagoberto Ligas., MD., Marval Regal  cc: [name], DO

## 2014-12-09 ENCOUNTER — Telehealth: Payer: Self-pay | Admitting: *Deleted

## 2014-12-09 MED ORDER — AMBULATORY NON FORMULARY MEDICATION: Status: DC

## 2014-12-09 NOTE — Telephone Encounter (Signed)
Patient called and stated that she needs a written Rx for Mastectomy Bras. Uses [facility] in [place].

## 2014-12-16 ENCOUNTER — Ambulatory Visit (INDEPENDENT_AMBULATORY_CARE_PROVIDER_SITE_OTHER): Payer: Medicare Other | Admitting: Gastroenterology

## 2014-12-16 ENCOUNTER — Encounter: Payer: Self-pay | Admitting: Gastroenterology

## 2014-12-16 VITALS — BP 126/64 | HR 80 | Ht 65.0 in | Wt 174.0 lb

## 2014-12-16 DIAGNOSIS — R1013 Epigastric pain: Secondary | ICD-10-CM

## 2014-12-16 DIAGNOSIS — K219 Gastro-esophageal reflux disease without esophagitis: Secondary | ICD-10-CM | POA: Diagnosis not present

## 2014-12-16 NOTE — Progress Notes (Signed)
    History of Present Illness: This is a 81 year old female returning for follow-up of epigastric pain, left chest pain and left back pain. Epigastric pain improved after increasing omeprazole to 40 mg twice daily. Her chest pain and back pain improved with no specific therapy. She note epigastric pain when she takes ibuprofen.   Current Medications, Allergies, Past Medical History, Past Surgical History, Family History and Social History were reviewed in [employer] record.  Physical Exam: General: Well developed , well nourished, no acute distress Head: Normocephalic and atraumatic Eyes:  sclerae anicteric, EOMI Ears: Normal auditory acuity Mouth: No deformity or lesions Lungs: Clear throughout to auscultation Heart: Regular rate and rhythm; no murmurs, rubs or bruits Abdomen: Soft, non tender and non distended. No masses, hepatosplenomegaly or hernias noted. Normal Bowel sounds Musculoskeletal: Symmetrical with no gross deformities  Pulses:  Normal pulses noted Extremities: No clubbing, cyanosis, edema or deformities noted Neurological: Alert oriented x 4, grossly nonfocal Psychological:  Alert and cooperative. Normal mood and affect  Assessment and Recommendations:  1. Epigastric pain and GERD. Continue omeprazole 40 mg twice daily and standard antireflux measures. Avoid ibuprofen.   2. Left chest pain and left back pain. Symptoms have resolved. Possible musculoskeletal symptoms. If these symptoms return she is advised to follow-up with her primary physician.

## 2014-12-16 NOTE — Patient Instructions (Signed)
Please continue omeprazole 40 mg twice daily.  Please follow up with Dr Fuller Plan in 1 year.

## 2015-01-12 ENCOUNTER — Telehealth: Payer: Self-pay | Admitting: *Deleted

## 2015-01-12 NOTE — Telephone Encounter (Signed)
Patient called and stated that she has had CBC and CMP drawn at Dr. Silvio Pate office and didn't want to duplicate the labwork. Removed the lab orders. Patient to come in for her Lipid and A1C.

## 2015-01-12 NOTE — Addendum Note (Signed)
Addended by: Rafael Bihari A on: 01/12/2015 12:21 PM   Modules accepted: Orders

## 2015-01-19 ENCOUNTER — Other Ambulatory Visit: Payer: Self-pay | Admitting: *Deleted

## 2015-01-19 ENCOUNTER — Other Ambulatory Visit: Payer: Medicare Other

## 2015-01-19 DIAGNOSIS — E785 Hyperlipidemia, unspecified: Secondary | ICD-10-CM

## 2015-01-19 DIAGNOSIS — R350 Frequency of micturition: Secondary | ICD-10-CM

## 2015-01-20 LAB — LIPID PANEL
Chol/HDL Ratio: 3.1 ratio units (ref 0.0–4.4)
Cholesterol, Total: 183 mg/dL (ref 100–199)
HDL: 60 mg/dL (ref >39)
LDL Calculated: 100 mg/dL — ABNORMAL HIGH (ref 0–99)
Triglycerides: 114 mg/dL (ref 0–149)
VLDL Cholesterol Cal: 23 mg/dL (ref 5–40)

## 2015-01-20 LAB — HEMOGLOBIN A1C
Est. average glucose Bld gHb Est-mCnc: 123 mg/dL
Hgb A1c MFr Bld: 5.9 % — ABNORMAL HIGH (ref 4.8–5.6)

## 2015-01-26 ENCOUNTER — Encounter: Payer: Self-pay | Admitting: Internal Medicine

## 2015-01-26 ENCOUNTER — Ambulatory Visit (INDEPENDENT_AMBULATORY_CARE_PROVIDER_SITE_OTHER): Payer: Medicare Other | Admitting: Internal Medicine

## 2015-01-26 VITALS — BP 126/78 | HR 70 | Temp 97.9°F | Resp 20 | Ht 65.0 in | Wt 174.6 lb

## 2015-01-26 DIAGNOSIS — J309 Allergic rhinitis, unspecified: Secondary | ICD-10-CM

## 2015-01-26 DIAGNOSIS — Z23 Encounter for immunization: Secondary | ICD-10-CM | POA: Diagnosis not present

## 2015-01-26 DIAGNOSIS — R42 Dizziness and giddiness: Secondary | ICD-10-CM | POA: Diagnosis not present

## 2015-01-26 DIAGNOSIS — K2100 Gastro-esophageal reflux disease with esophagitis, without bleeding: Secondary | ICD-10-CM

## 2015-01-26 DIAGNOSIS — E785 Hyperlipidemia, unspecified: Secondary | ICD-10-CM | POA: Diagnosis not present

## 2015-01-26 DIAGNOSIS — Z0000 Encounter for general adult medical examination without abnormal findings: Secondary | ICD-10-CM

## 2015-01-26 DIAGNOSIS — R739 Hyperglycemia, unspecified: Secondary | ICD-10-CM | POA: Diagnosis not present

## 2015-01-26 DIAGNOSIS — M8580 Other specified disorders of bone density and structure, unspecified site: Secondary | ICD-10-CM

## 2015-01-26 MED ORDER — TETANUS-DIPHTH-ACELL PERTUSSIS 5-2.5-18.5 LF-MCG/0.5 IM SUSP: 0.5000 mL | Freq: Once | INTRAMUSCULAR | Status: DC

## 2015-01-26 NOTE — Progress Notes (Signed)
Patient ID: Rebecca Guzman, female   DOB: 1934/02/04, 81 y.o.   MRN: [id number]   Location:  [facility]  Code Status: full code Goals of Care: Advanced Directive information Does patient have an advance directive?: Yes, Type of Advance Directive: [place];Living will, Does patient want to make changes to advanced directive?: No - Patient declined  Chief Complaint  Patient presents with  . Annual Exam    Annual exam  . Immunizations    needs new script for Tdap    HPI: Patient is a 81 y.o. white female seen in the office today for her annual exam.    Using omeprazole 40mg  po bid and tolerating well.  GERD better.    Left chest pain and back pain resolved.  This past Thursday, was cooking dinner, felt very lightheadedness, not spinning.  Next day, she felt weak all day.  Had visited her children and grandchildren before that--had fries and hamburger that were greasy and she wasn't used to eating them--had 3-4 BMs each day the following two days.  Doesn't usually eat that food and figures she got dehydrated.    RE: cholesterol.  She has always eaten low fat foods and is concerned now that they could have added sugars, etc.   Uses stevia in her iced tea.  No shortness of breath.   Could use more energy at times, but spirits are good.  She has no memory loss.    Review of Systems:  Review of Systems  Constitutional: Positive for malaise/fatigue.  HENT: Positive for hearing loss. Negative for congestion.        Has hearing aides and gets annual testing w/o change in 5-6 years  Eyes:       Glasses--gets annual vision exam--had bilateral cataract surgeries  Respiratory: Negative for shortness of breath.   Cardiovascular: Negative for chest pain and leg swelling.  Gastrointestinal: Negative for abdominal pain, constipation, blood in stool and melena.  Genitourinary: Positive for urgency. Negative for dysuria and  frequency.       1 episode of nocturia each night--some urgency then, but none in daytime; does drink plenty of water  Musculoskeletal: Negative for falls.  Neurological: Positive for dizziness and tingling. Negative for loss of consciousness.       Of feet bilaterally--unchanged in a year  Psychiatric/Behavioral: Negative for depression and memory loss. The patient does not have insomnia.     Past Medical History  Diagnosis Date  . GERD (gastroesophageal reflux disease)   . Uterine prolapse     s/p hysterectomy  . Breast cancer 1992  . Asthma   . Osteoarthritis of right knee   . Colon polyps     Dr. Lindalou Hose in [place]  . Gallstones   . Hyperlipidemia LDL goal <100   . Bladder prolapse, female, acquired   . Allergic sinusitis   . Tubular adenoma of colon   . Hyperplastic colon polyp     Past Surgical History  Procedure Laterality Date  . Pelvic floor repair    . Laparoscopic assisted vaginal hysterectomy    . Cholecystectomy  1985    Dr. Lindalou Hose (retired)   . Mastectomy  1992    Left  . Colonoscopy  2009    Allergies  Allergen Reactions  . Codeine   . Morphine And Related     Nausea, and drop in blood pressure   Medications: Patient's Medications  New Prescriptions   No medications on  file  Previous Medications   ACETAMINOPHEN (TYLENOL) 500 MG TABLET    Take 500 mg by mouth every 6 (six) hours as needed.   ALBUTEROL (PROVENTIL HFA;VENTOLIN HFA) 108 (90 BASE) MCG/ACT INHALER    Inhale 2 puffs into the lungs as needed for wheezing.   AMBULATORY NON FORMULARY MEDICATION    Mastectomy Bras Dx: C50.919   CALCIUM CARBONATE-VITAMIN D (CALTRATE 600+D PO)    Take 2 tablets by mouth daily. Getting 2000 of vit D per day   DICLOFENAC SODIUM (VOLTAREN) 1 % GEL    Apply 4 grams to right knee and 2 grams to right shoulder up to four times daily as needed for arthritis pain   FEXOFENADINE (ALLEGRA) 180 MG TABLET    Take 180 mg by mouth daily.   FLUTICASONE (FLONASE) 50 MCG/ACT  NASAL SPRAY       MULTIPLE VITAMIN (MULTIVITAMIN) TABLET    Take 1 tablet by mouth daily.   OMEPRAZOLE (PRILOSEC) 40 MG CAPSULE    Take 1 capsule (40 mg total) by mouth 2 (two) times daily.   PRAVASTATIN (PRAVACHOL) 40 MG TABLET    take 1 tablet by mouth once daily   PROBIOTIC PRODUCT (PROBIOTIC FORMULA PO)    Take 1 tablet by mouth daily.  Modified Medications   Modified Medication Previous Medication   TDAP (BOOSTRIX) 5-2.5-18.5 LF-MCG/0.5 INJECTION Tdap (BOOSTRIX) 5-2.5-18.5 LF-MCG/0.5 injection      Inject 0.5 mLs into the muscle once.    Inject 0.5 mLs into the muscle once.  Discontinued Medications   No medications on file    Physical Exam: Filed Vitals:   01/26/15 1400  BP: 126/78  Pulse: 70  Temp: 97.9 F (36.6 C)  TempSrc: Oral  Resp: 20  Height: 5\' 5"  (1.651 m)  Weight: 174 lb 9.6 oz (79.198 kg)  SpO2: 96%   Physical Exam  Constitutional: She is oriented to person, place, and time. She appears well-developed and well-nourished. No distress.  HENT:  Head: Normocephalic and atraumatic.  Right Ear: External ear normal.  Left Ear: External ear normal.  Nose: Nose normal.  Mouth/Throat: Oropharynx is clear and moist. No oropharyngeal exudate.  Eyes: Conjunctivae and EOM are normal. Pupils are equal, round, and reactive to light.  glasses  Neck: Normal range of motion. Neck supple. No JVD present. No tracheal deviation present. No thyromegaly present.  Cardiovascular: Normal rate, regular rhythm, normal heart sounds and intact distal pulses.   Pulmonary/Chest: Effort normal and breath sounds normal. No respiratory distress. Right breast exhibits no inverted nipple, no mass, no nipple discharge, no skin change and no tenderness.  Left mastectomy  Abdominal: Soft. Bowel sounds are normal. She exhibits no distension. There is no tenderness.  Musculoskeletal: Normal range of motion. She exhibits no edema or tenderness.  Neurological: She is alert and oriented to person,  place, and time.  Areflexic at patellas  Skin: Skin is warm and dry.  Psychiatric: She has a normal mood and affect. Her behavior is normal. Judgment and thought content normal.    Labs reviewed: Basic Metabolic Panel:  Recent Labs  02/20/14 0939 11/10/14 1116  NA  --  138  K  --  5.0  CL  --  106  CO2  --  28  GLUCOSE  --  84  BUN  --  15  CREATININE  --  0.82  CALCIUM  --  9.5  TSH 3.820 3.03   Liver Function Tests:  Recent Labs  11/10/14 1116  AST  17  ALT 13  ALKPHOS 69  BILITOT 0.6  PROT 7.0  ALBUMIN 4.1    Recent Labs  11/10/14 1116  LIPASE 19.0   No results for input(s): AMMONIA in the last 8760 hours. CBC:  Recent Labs  11/10/14 1116  WBC 7.2  NEUTROABS 4.5  HGB 13.7  HCT 39.7  MCV 87.3  PLT 227.0   Lipid Panel:  Recent Labs  01/19/15 0809  CHOL 183  HDL 60  LDLCALC 100*  TRIG 114  CHOLHDL 3.1   Lab Results  Component Value Date   HGBA1C 5.9* 01/19/2015   Assessment/Plan 1. Routine general medical examination at a health care facility -will be up to date on vaccines after gets tdap booster at pharmacy, Rx given -gets mammo in August each year with prior breast ca with left mastectomy--8/15 nl -cscope in 2014 -BMP in 6/15 -not depressed on PHQ2 -memory normal  -no falls -is active with walking -Sleeps well except one time per night nocturia  2. Episodic lightheadedness -seems this was due to dehydration from loose stools for 2 days after a very fatty meal, then resolved - CBC with Differential/Platelet; Future - Comprehensive metabolic panel; Future  3. Gastroesophageal reflux disease with esophagitis - well controlled with bid omeprazole, sees Dr Fuller Plan as needed - Comprehensive metabolic panel; Future  4. Hyperlipidemia LDL goal <100 -continues pravachol and watches her fat intake - Lipid panel; Future  5. Osteopenia -cont ca with D  6. Allergic sinusitis - cont allegra for this - CBC with Differential/Platelet;  Future  7. Need for prophylactic vaccination with combined diphtheria-tetanus-pertussis (DTP) vaccine - Tdap (BOOSTRIX) 5-2.5-18.5 LF-MCG/0.5 injection; Inject 0.5 mLs into the muscle once.  Dispense: 0.5 mL; Refill: 0  8. Hyperglycemia -is working on her diet and her exercise and this has been pretty stable - Comprehensive metabolic panel; Future - Hemoglobin A1c; Future  Labs/tests ordered:   Orders Placed This Encounter  Procedures  . CBC with Differential/Platelet    Standing Status: Future     Number of Occurrences:      Standing Expiration Date: 01/25/2017  . Comprehensive metabolic panel    Standing Status: Future     Number of Occurrences:      Standing Expiration Date: 01/25/2017    Order Specific Question:  Has the patient fasted?    Answer:  Yes  . Hemoglobin A1c    Standing Status: Future     Number of Occurrences:      Standing Expiration Date: 01/25/2017  . Lipid panel    Standing Status: Future     Number of Occurrences:      Standing Expiration Date: 01/25/2017    Order Specific Question:  Has the patient fasted?    Answer:  Yes    Next appt:  1 year for annual exam with labs before  Rebecca Guzman, D.O. [facility] [address], [place] [address] Cell Phone (Mon-Fri 8am-5pm):  [phone] On Call:  [phone] & follow prompts after 5pm & weekends Office Phone:  [phone] Office Fax:  [phone]

## 2015-02-27 ENCOUNTER — Other Ambulatory Visit: Payer: Self-pay

## 2015-02-27 DIAGNOSIS — Z9012 Acquired absence of left breast and nipple: Secondary | ICD-10-CM

## 2015-02-27 DIAGNOSIS — Z853 Personal history of malignant neoplasm of breast: Secondary | ICD-10-CM

## 2015-02-27 DIAGNOSIS — Z1231 Encounter for screening mammogram for malignant neoplasm of breast: Secondary | ICD-10-CM

## 2015-04-03 ENCOUNTER — Ambulatory Visit
Admission: RE | Admit: 2015-04-03 | Discharge: 2015-04-03 | Disposition: A | Discharge status: Home / Self Care | Payer: Medicare Other | Source: Ambulatory Visit

## 2015-04-03 DIAGNOSIS — Z9012 Acquired absence of left breast and nipple: Secondary | ICD-10-CM

## 2015-04-03 DIAGNOSIS — Z853 Personal history of malignant neoplasm of breast: Secondary | ICD-10-CM

## 2015-04-03 DIAGNOSIS — Z1231 Encounter for screening mammogram for malignant neoplasm of breast: Secondary | ICD-10-CM

## 2015-04-06 ENCOUNTER — Other Ambulatory Visit: Payer: Self-pay | Admitting: Internal Medicine

## 2015-04-06 ENCOUNTER — Ambulatory Visit
Admission: RE | Admit: 2015-04-06 | Discharge: 2015-04-06 | Disposition: A | Discharge status: Home / Self Care | Payer: Medicare Other | Source: Ambulatory Visit | Attending: Internal Medicine | Admitting: Internal Medicine

## 2015-04-06 ENCOUNTER — Ambulatory Visit (INDEPENDENT_AMBULATORY_CARE_PROVIDER_SITE_OTHER): Payer: Medicare Other | Admitting: Internal Medicine

## 2015-04-06 ENCOUNTER — Other Ambulatory Visit: Payer: Medicare Other

## 2015-04-06 ENCOUNTER — Encounter: Payer: Self-pay | Admitting: Internal Medicine

## 2015-04-06 VITALS — BP 124/80 | HR 85 | Temp 98.1°F | Resp 14 | Ht 65.0 in | Wt 174.6 lb

## 2015-04-06 DIAGNOSIS — M5416 Radiculopathy, lumbar region: Secondary | ICD-10-CM | POA: Diagnosis not present

## 2015-04-06 DIAGNOSIS — M810 Age-related osteoporosis without current pathological fracture: Secondary | ICD-10-CM

## 2015-04-06 DIAGNOSIS — M5136 Other intervertebral disc degeneration, lumbar region: Secondary | ICD-10-CM

## 2015-04-06 DIAGNOSIS — IMO0001 Reserved for inherently not codable concepts without codable children: Secondary | ICD-10-CM | POA: Insufficient documentation

## 2015-04-06 DIAGNOSIS — S32030A Wedge compression fracture of third lumbar vertebra, initial encounter for closed fracture: Secondary | ICD-10-CM | POA: Insufficient documentation

## 2015-04-06 DIAGNOSIS — M47816 Spondylosis without myelopathy or radiculopathy, lumbar region: Secondary | ICD-10-CM | POA: Insufficient documentation

## 2015-04-06 HISTORY — DX: Wedge compression fracture of third lumbar vertebra, initial encounter for closed fracture: S32.030A

## 2015-04-06 NOTE — Progress Notes (Signed)
Patient ID: Rebecca Guzman, female   DOB: 1934/05/18, 81 y.o.   MRN: [id number]   Location:  [facility]  Goals of Care: Advanced Directive information Does patient have an advance directive?: No, Would patient like information on creating an advanced directive?: Yes - Educational materials given (Given at last OV, patient still with copy at home )   Chief Complaint  Patient presents with  . Acute Visit    Right hip pain x 6 weeks     HPI: Patient is a 81 y.o. white female seen in the office today for an acute visit due to 6 weeks of right hip pain.  Had an episode 4 years ago.  Not sure what triggered it this time.  Runs into her buttock and down the outside of her thigh, not past her knee.  Not as bad as that time.  Also knows more what to do.  Has been stretching and sleeping on her heating pad.  Has a cushioned backrest that vibrates and is heating.  Ok when standing and walking.  Hurts when sitting and goes to stand and walk. Has taken some tylenol.  Once in a great while, she will take an ibuprofen at night, but has to watch out with it on account of her stomach.  Some days mild, other days worse.  Wonders if in July when it was hot, she was not walking outside, but on the treadmill 20 mins per day.  Did walk on it flat.  Otherwise walking does not make it hurt, but has not been back on treadmill, only outside.  Never had xrays last time.  Has known scoliosis and has always had some low back pack and muscles stay sore in the low back area.  No falls.  Ice didn't seem to help at the beginning. Has a brace she wears that is stretchy with velcro.  Has the voltaren gel.  Has not needed it for her knee.    Review of Systems:  Review of Systems  Constitutional: Negative for malaise/fatigue.  Musculoskeletal: Positive for back pain and joint pain. Negative for myalgias and falls.    Past Medical History  Diagnosis Date  . GERD (gastroesophageal reflux  disease)   . Uterine prolapse     s/p hysterectomy  . Breast cancer 1992  . Asthma   . Osteoarthritis of right knee   . Colon polyps     Dr. Lindalou Hose in [place]  . Gallstones   . Hyperlipidemia LDL goal <100   . Bladder prolapse, female, acquired   . Allergic sinusitis   . Tubular adenoma of colon   . Hyperplastic colon polyp     Past Surgical History  Procedure Laterality Date  . Pelvic floor repair    . Laparoscopic assisted vaginal hysterectomy    . Cholecystectomy  1985    Dr. Lindalou Hose (retired)   . Mastectomy  1992    Left  . Colonoscopy  2009    Allergies  Allergen Reactions  . Codeine   . Morphine And Related     Nausea, and drop in blood pressure   Medications: Patient's Medications  New Prescriptions   No medications on file  Previous Medications   ACETAMINOPHEN (TYLENOL) 500 MG TABLET    Take 500 mg by mouth every 6 (six) hours as needed.   ALBUTEROL (PROVENTIL HFA;VENTOLIN HFA) 108 (90 BASE) MCG/ACT INHALER    Inhale 2 puffs into the lungs as needed for wheezing.  AMBULATORY NON FORMULARY MEDICATION    Mastectomy Bras Dx: C50.919   CALCIUM CARBONATE-VITAMIN D (CALTRATE 600+D PO)    Take 2 tablets by mouth daily. Getting 2000 of vit D per day   DICLOFENAC SODIUM (VOLTAREN) 1 % GEL    Apply 4 grams to right knee and 2 grams to right shoulder up to four times daily as needed for arthritis pain   FEXOFENADINE (ALLEGRA) 180 MG TABLET    Take 180 mg by mouth daily.   FLUTICASONE (FLONASE) 50 MCG/ACT NASAL SPRAY       MULTIPLE VITAMIN (MULTIVITAMIN) TABLET    Take 1 tablet by mouth daily.   OMEPRAZOLE (PRILOSEC) 40 MG CAPSULE    Take 1 capsule (40 mg total) by mouth 2 (two) times daily.   PRAVASTATIN (PRAVACHOL) 40 MG TABLET    take 1 tablet by mouth once daily   PROBIOTIC PRODUCT (PROBIOTIC FORMULA PO)    Take 1 tablet by mouth daily.  Modified Medications   No medications on file  Discontinued Medications   TDAP (BOOSTRIX) 5-2.5-18.5 LF-MCG/0.5 INJECTION     Inject 0.5 mLs into the muscle once.    Physical Exam: Filed Vitals:   04/06/15 0902  BP: 124/80  Pulse: 85  Temp: 98.1 F (36.7 C)  TempSrc: Oral  Resp: 14  Height: 5\' 5"  (1.651 m)  Weight: 174 lb 9.6 oz (79.198 kg)  SpO2: 95%   Physical Exam  Constitutional: She is oriented to person, place, and time. She appears well-developed and well-nourished. No distress.  Cardiovascular: Normal rate and regular rhythm.   Pulmonary/Chest: Effort normal.  Musculoskeletal: Normal range of motion. She exhibits tenderness. She exhibits no edema.  Tenderness deep in right buttock and lateral thigh  Neurological: She is alert and oriented to person, place, and time.  Psychiatric: She has a normal mood and affect. Her behavior is normal. Judgment and thought content normal.    Labs reviewed: Basic Metabolic Panel:  Recent Labs  11/10/14 1116  NA 138  K 5.0  CL 106  CO2 28  GLUCOSE 84  BUN 15  CREATININE 0.82  CALCIUM 9.5  TSH 3.03   Liver Function Tests:  Recent Labs  11/10/14 1116  AST 17  ALT 13  ALKPHOS 69  BILITOT 0.6  PROT 7.0  ALBUMIN 4.1    Recent Labs  11/10/14 1116  LIPASE 19.0   No results for input(s): AMMONIA in the last 8760 hours. CBC:  Recent Labs  11/10/14 1116  WBC 7.2  NEUTROABS 4.5  HGB 13.7  HCT 39.7  MCV 87.3  PLT 227.0   Lipid Panel:  Recent Labs  01/19/15 0809  CHOL 183  HDL 60  LDLCALC 100*  TRIG 114  CHOLHDL 3.1   Lab Results  Component Value Date   HGBA1C 5.9* 01/19/2015    Procedures since last visit: Normal mammogram just completed.  Assessment/Plan 1. Radicular pain of right lower back - cont conservative treatments with tylenol, heat, stretches -will obtain xrays and then consider PT referral or orthopedics referral if needed -low back pain handout given - DG HIP UNILAT WITH PELVIS 2-3 VIEWS RIGHT; Future - DG Lumbar Spine Complete; Future -she did not feel her pain required any stronger  medications  Labs/tests ordered: Orders Placed This Encounter  Procedures  . DG HIP UNILAT WITH PELVIS 2-3 VIEWS RIGHT    Standing Status: Future     Number of Occurrences:      Standing Expiration Date: 06/05/2016  Order Specific Question:  Reason for Exam (SYMPTOM  OR DIAGNOSIS REQUIRED)    Answer:  right low back pain with radiculopathy    Order Specific Question:  Preferred imaging location?    Answer:  [facility]  . DG Lumbar Spine Complete    Standing Status: Future     Number of Occurrences:      Standing Expiration Date: 06/05/2016    Order Specific Question:  Reason for Exam (SYMPTOM  OR DIAGNOSIS REQUIRED)    Answer:  right low back pain with radiculopathy    Order Specific Question:  Preferred imaging location?    Answer:  [facility]    Next appt:  Keep regular visit. PRN  Jannessa Ogden L. Averill Pons, D.O. [facility] [address], [place] [address] Cell Phone (Mon-Fri 8am-5pm):  [phone] On Call:  [phone] & follow prompts after 5pm & weekends Office Phone:  [phone] Office Fax:  [phone]

## 2015-04-06 NOTE — Patient Instructions (Signed)
Back Pain, Adult Low back pain is very common. About 1 in 5 people have back pain.The cause of low back pain is rarely dangerous. The pain often gets better over time.About half of people with a sudden onset of back pain feel better in just 2 weeks. About 8 in 10 people feel better by 6 weeks.  CAUSES Some common causes of back pain include:  Strain of the muscles or ligaments supporting the spine.  Wear and tear (degeneration) of the spinal discs.  Arthritis.  Direct injury to the back. DIAGNOSIS Most of the time, the direct cause of low back pain is not known.However, back pain can be treated effectively even when the exact cause of the pain is unknown.Answering your caregiver's questions about your overall health and symptoms is one of the most accurate ways to make sure the cause of your pain is not dangerous. If your caregiver needs more information, he or she may order lab work or imaging tests (X-rays or MRIs).However, even if imaging tests show changes in your back, this usually does not require surgery. HOME CARE INSTRUCTIONS For many people, back pain returns.Since low back pain is rarely dangerous, it is often a condition that people can learn to manageon their own.   Remain active. It is stressful on the back to sit or stand in one place. Do not sit, drive, or stand in one place for more than 30 minutes at a time. Take short walks on level surfaces as soon as pain allows.Try to increase the length of time you walk each day.  Do not stay in bed.Resting more than 1 or 2 days can delay your recovery.  Do not avoid exercise or work.Your body is made to move.It is not dangerous to be active, even though your back may hurt.Your back will likely heal faster if you return to being active before your pain is gone.  Pay attention to your body when you bend and lift. Many people have less discomfortwhen lifting if they bend their knees, keep the load close to their bodies,and  avoid twisting. Often, the most comfortable positions are those that put less stress on your recovering back.  Find a comfortable position to sleep. Use a firm mattress and lie on your side with your knees slightly bent. If you lie on your back, put a pillow under your knees.  Only take over-the-counter or prescription medicines as directed by your caregiver. Over-the-counter medicines to reduce pain and inflammation are often the most helpful.Your caregiver may prescribe muscle relaxant drugs.These medicines help dull your pain so you can more quickly return to your normal activities and healthy exercise.  Put ice on the injured area.  Put ice in a plastic bag.  Place a towel between your skin and the bag.  Leave the ice on for 15-20 minutes, 03-04 times a day for the first 2 to 3 days. After that, ice and heat may be alternated to reduce pain and spasms.  Ask your caregiver about trying back exercises and gentle massage. This may be of some benefit.  Avoid feeling anxious or stressed.Stress increases muscle tension and can worsen back pain.It is important to recognize when you are anxious or stressed and learn ways to manage it.Exercise is a great option. SEEK MEDICAL CARE IF:  You have pain that is not relieved with rest or medicine.  You have pain that does not improve in 1 week.  You have new symptoms.  You are generally not feeling well. SEEK   IMMEDIATE MEDICAL CARE IF:   You have pain that radiates from your back into your legs.  You develop new bowel or bladder control problems.  You have unusual weakness or numbness in your arms or legs.  You develop nausea or vomiting.  You develop abdominal pain.  You feel faint. Document Released: 07/25/2005 Document Revised: 01/24/2012 Document Reviewed: 11/26/2013 ExitCare Patient Information 2015 [employer]. This information is not intended to replace advice given to you by your health care provider. Make sure you  discuss any questions you have with your health care provider.  

## 2015-04-07 ENCOUNTER — Telehealth: Payer: Self-pay

## 2015-04-07 NOTE — Telephone Encounter (Signed)
She does have extensive arthritis in her lower back that affects both the discs and the facets (those are the little things that come off the sides next to the nerves). She also has had a compression fracture of her third lumbar vertebra which could be playing a role in her pain. I recommend outpatient PT. Also she should add vitamin D 2000 units daily onto the calcium with D she already takes. I also recommend she take prolia shots for her osteoporosis. Her stomach will not tolerate oral pills.   Discussed with patient. Patient states she is in agreement with starting prolia. Information given to [place] in billing to confirm coverage. Patient would like to discuss PT option with [facility]), they have an orthopedic nurse there. Patient will call with update on conversation.

## 2015-04-07 NOTE — Telephone Encounter (Signed)
-----   Message from [name], DO sent at 04/07/2015  9:50 AM EDT ----- See comments on her xrays.  Showed up in my inbasket again  ----- Message -----    From: Rad Results In Interface    Sent: 04/06/2015  10:35 AM      To: [name], DO

## 2015-04-09 ENCOUNTER — Ambulatory Visit: Payer: Medicare Other | Admitting: Internal Medicine

## 2015-04-24 ENCOUNTER — Ambulatory Visit (INDEPENDENT_AMBULATORY_CARE_PROVIDER_SITE_OTHER): Payer: Medicare Other

## 2015-04-24 DIAGNOSIS — M8580 Other specified disorders of bone density and structure, unspecified site: Secondary | ICD-10-CM

## 2015-04-24 DIAGNOSIS — M810 Age-related osteoporosis without current pathological fracture: Secondary | ICD-10-CM | POA: Diagnosis not present

## 2015-04-24 MED ORDER — DENOSUMAB 60 MG/ML ~~LOC~~ SOLN
60.0000 mg | Freq: Once | SUBCUTANEOUS | Status: AC
  Administered 2015-04-24: 60 mg via SUBCUTANEOUS

## 2015-05-25 ENCOUNTER — Telehealth: Payer: Self-pay | Admitting: *Deleted

## 2015-05-25 NOTE — Telephone Encounter (Signed)
Patient called and stated that she started the prolia in August and she read the side effects and she is having the bone soreness/pain and muscle soreness. How long will it last and what do you advise.

## 2015-05-25 NOTE — Telephone Encounter (Signed)
I would have expected her to have it the first few days but not prolonged like 2 mos later.  I'm sorry this is happening.

## 2015-05-26 NOTE — Telephone Encounter (Signed)
Patient stated that she is not sure that it is all the Prolia with having the back pain to. She just wanted to make Dr. Mariea Clonts aware.

## 2015-06-08 ENCOUNTER — Other Ambulatory Visit: Payer: Self-pay

## 2015-06-08 DIAGNOSIS — R079 Chest pain, unspecified: Secondary | ICD-10-CM

## 2015-06-08 DIAGNOSIS — K219 Gastro-esophageal reflux disease without esophagitis: Secondary | ICD-10-CM

## 2015-06-08 DIAGNOSIS — R1013 Epigastric pain: Secondary | ICD-10-CM

## 2015-06-08 MED ORDER — OMEPRAZOLE 40 MG PO CPDR: 40.0000 mg | DELAYED_RELEASE_CAPSULE | Freq: Two times a day (BID) | ORAL | Status: DC

## 2015-07-13 ENCOUNTER — Other Ambulatory Visit: Payer: Self-pay | Admitting: Dermatology

## 2015-08-14 ENCOUNTER — Ambulatory Visit (INDEPENDENT_AMBULATORY_CARE_PROVIDER_SITE_OTHER): Payer: Medicare Other | Admitting: Internal Medicine

## 2015-08-14 ENCOUNTER — Encounter: Payer: Self-pay | Admitting: Internal Medicine

## 2015-08-14 VITALS — BP 128/76 | HR 79 | Temp 97.5°F | Resp 20 | Ht 65.0 in | Wt 174.2 lb

## 2015-08-14 DIAGNOSIS — R8290 Unspecified abnormal findings in urine: Secondary | ICD-10-CM

## 2015-08-14 DIAGNOSIS — IMO0001 Reserved for inherently not codable concepts without codable children: Secondary | ICD-10-CM

## 2015-08-14 DIAGNOSIS — C50912 Malignant neoplasm of unspecified site of left female breast: Secondary | ICD-10-CM

## 2015-08-14 DIAGNOSIS — M5416 Radiculopathy, lumbar region: Secondary | ICD-10-CM | POA: Diagnosis not present

## 2015-08-14 LAB — POCT URINALYSIS DIPSTICK
Bilirubin, UA: NEGATIVE
Glucose, UA: NEGATIVE
Nitrite, UA: NEGATIVE
Spec Grav, UA: 1.01
Urobilinogen, UA: 0.2
pH, UA: 5

## 2015-08-14 NOTE — Progress Notes (Signed)
Patient ID: Rebecca Guzman, female   DOB: [birth year], 82 y.o.   MRN: [id number]   Location: [place] Provider: Rexene Edison. Mariea Clonts, D.O., C.M.D.  Goals of Care: Advanced Directive information Advanced Directives 08/14/2015  Does patient have an advance directive? No  Type of Advance Directive -  Does patient want to make changes to advanced directive? -  Copy of advanced directive(s) in chart? -  Would patient like information on creating an advanced directive? Yes - Environmental education officer Complaint  Patient presents with  . Acute Visit    Patient c/o Having  pain in right hip wants referral for ortho, having trouble with odor from bladder but she has no pain     HPI: Patient is a 82 y.o. female seen in the office today for an acute visit for right "hip pain".  This is her 6th month with it.  Had 10 electrode treatments, done stretches[facility] and [facility] needs to see [facility]o now.  Pain all the way down thigh to knee.    xrays were done in August by me and showed degenerative disc disease of lower back, facet arthropathy and L3 compression fracture.  Hips and pelvis--slight osteoarthritis in both hips, lumbar levoscoliosis. Has some longstanding lower back muscle pains.   When sits and goes to stand, pain begins, feels tight when gets up, loosens and can walk normally.    She's sure her uterine prolapse is worsening.  Says her urine is malodorous. GYN had been keeping an eye on that, but insurance changed and she no longer did the surgery for that.  She was at [facility].  Worried though that she might have an infection, but denies dysuria, pain.  Urinates less at night.  Has to get up two times at night.  She feels like it smells infected and it comes and goes.  Thinks it's when she doesn't go often enough in the daytime.    Needs new breast prosthesis (left) and bras.  Rx provided and location of nearest supplier on [facility].  Review of Systems:    Review of Systems  Constitutional: Negative for fever and chills.  Gastrointestinal: Negative for abdominal pain.  Genitourinary: Positive for urgency and frequency.       Foul smelling urine and drinking plenty of water  Musculoskeletal: Positive for myalgias, back pain and joint pain. Negative for falls.  Neurological: Positive for sensory change. Negative for focal weakness.    Past Medical History  Diagnosis Date  . GERD (gastroesophageal reflux disease)   . Uterine prolapse     s/p hysterectomy  . Breast cancer ([place]) 1992  . Asthma   . Osteoarthritis of right knee   . Colon polyps     Dr. Lindalou Hose in [place]  . Gallstones   . Hyperlipidemia LDL goal <100   . Bladder prolapse, female, acquired   . Allergic sinusitis   . Tubular adenoma of colon   . Hyperplastic colon polyp     Past Surgical History  Procedure Laterality Date  . Pelvic floor repair    . Laparoscopic assisted vaginal hysterectomy    . Cholecystectomy  1985    Dr. Lindalou Hose (retired)   . Mastectomy  1992    Left  . Colonoscopy  2009    Allergies  Allergen Reactions  . Codeine   . Morphine And Related     Nausea, and drop in blood pressure  Medication List       This list is accurate as of: 08/14/15 10:43 AM.  Always use your most recent med list.               acetaminophen 500 MG tablet  Commonly known as:  TYLENOL  Take 500 mg by mouth every 6 (six) hours as needed.     albuterol 108 (90 Base) MCG/ACT inhaler  Commonly known as:  PROVENTIL HFA;VENTOLIN HFA  Inhale 2 puffs into the lungs as needed for wheezing.     AMBULATORY NON FORMULARY MEDICATION  Mastectomy Bras Dx: C50.919     CALTRATE 600+D PO  Take 2 tablets by mouth daily. Getting 2000 of vit D per day     diclofenac sodium 1 % Gel  Commonly known as:  VOLTAREN  Apply 4 grams to right knee and 2 grams to right shoulder up to four times daily as needed for arthritis pain     fexofenadine 180 MG tablet  Commonly  known as:  ALLEGRA  Take 180 mg by mouth daily.     fluticasone 50 MCG/ACT nasal spray  Commonly known as:  FLONASE     multivitamin tablet  Take 1 tablet by mouth daily.     omeprazole 40 MG capsule  Commonly known as:  PRILOSEC  Take 1 capsule (40 mg total) by mouth 2 (two) times daily.     pravastatin 40 MG tablet  Commonly known as:  PRAVACHOL  take 1 tablet by mouth once daily     PROBIOTIC FORMULA PO  Take 1 tablet by mouth daily.        Physical Exam: Filed Vitals:   08/14/15 1009  BP: 128/76  Pulse: 79  Temp: 97.5 F (36.4 C)  TempSrc: Oral  Resp: 20  Height: 5\' 5"  (1.651 m)  Weight: 174 lb 3.2 oz (79.017 kg)  SpO2: 97%   Body mass index is 28.99 kg/(m^2). Physical Exam  Constitutional: She is oriented to person, place, and time. She appears well-developed and well-nourished.  Cardiovascular: Normal rate, regular rhythm and normal heart sounds.   Pulmonary/Chest: Effort normal and breath sounds normal.  Abdominal: Soft. Bowel sounds are normal. She exhibits no distension and no mass. There is no tenderness.  Musculoskeletal: Normal range of motion. She exhibits no tenderness.  Points to area in right sacroiliac and down right lateral thigh  Neurological: She is alert and oriented to person, place, and time. She has normal reflexes. No cranial nerve deficit. She exhibits normal muscle tone. Coordination normal.    Labs reviewed: Basic Metabolic Panel:  Recent Labs  11/10/14 1116  NA 138  K 5.0  CL 106  CO2 28  GLUCOSE 84  BUN 15  CREATININE 0.82  CALCIUM 9.5  TSH 3.03   Liver Function Tests:  Recent Labs  11/10/14 1116  AST 17  ALT 13  ALKPHOS 69  BILITOT 0.6  PROT 7.0  ALBUMIN 4.1    Recent Labs  11/10/14 1116  LIPASE 19.0   No results for input(s): AMMONIA in the last 8760 hours. CBC:  Recent Labs  11/10/14 1116  WBC 7.2  NEUTROABS 4.5  HGB 13.7  HCT 39.7  MCV 87.3  PLT 227.0   Lipid Panel:  Recent Labs   01/19/15 0809  CHOL 183  HDL 60  LDLCALC 100*  TRIG 114  CHOLHDL 3.1   Lab Results  Component Value Date   HGBA1C 5.9* 01/19/2015   Reviewed prior lumbar and hip  xrays in hpi  Assessment/Plan 1. Radicular pain of right lower back - requests to see ortho at this point -has been through therapy with some headway, but discomfort remains - Ambulatory referral to Orthopedic Surgery  2. Bad odor of urine - she suspects her prolapse has worsened - POC Urinalysis Dipstick showed only wbc but sent off due to her concerns (expect it will be negative also) - Culture, Urine -advised we could do a pelvic exam to assess for prolapse or she can see her gyn if desired to further eval this concern if cx is negative  3. Malignant neoplasm of left female breast, unspecified site of breast ([place]) -left prosthesis bras ordered for her  Labs/tests ordered:   Orders Placed This Encounter  Procedures  . Culture, Urine  . Ambulatory referral to Orthopedic Surgery    Referral Priority:  Routine    Referral Type:  Surgical    Referral Reason:  Specialty Services Required    Requested Specialty:  Orthopedic Surgery    Number of Visits Requested:  1  . POC Urinalysis Dipstick    Next appt:  01/28/2016 for annual with labs 6/20   Giordana Weinheimer L. Aeon Koors, D.O. [facility] [address], [place] [address] Cell Phone (Mon-Fri 8am-5pm):  [phone] On Call:  [phone] & follow prompts after 5pm & weekends Office Phone:  [phone] Office Fax:  [phone]

## 2015-08-16 LAB — URINE CULTURE: Organism ID, Bacteria: NO GROWTH

## 2015-08-27 ENCOUNTER — Telehealth: Payer: Self-pay | Admitting: *Deleted

## 2015-08-27 NOTE — Telephone Encounter (Signed)
Received fax order from [facility] [phone] for [place] and Silicone Breast Prosthesis due to replacement: damaged past use of lifetime. Given to Dr. Mariea Clonts to review and sign and to be faxed back to F#: [phone]

## 2015-10-08 ENCOUNTER — Other Ambulatory Visit: Payer: Self-pay | Admitting: Orthopaedic Surgery

## 2015-10-08 DIAGNOSIS — M419 Scoliosis, unspecified: Secondary | ICD-10-CM

## 2015-10-08 DIAGNOSIS — M545 Low back pain: Secondary | ICD-10-CM

## 2015-10-15 ENCOUNTER — Ambulatory Visit
Admission: RE | Admit: 2015-10-15 | Discharge: 2015-10-15 | Disposition: A | Discharge status: Home / Self Care | Payer: Medicare Other | Source: Ambulatory Visit | Attending: Orthopaedic Surgery | Admitting: Orthopaedic Surgery

## 2015-10-15 DIAGNOSIS — M419 Scoliosis, unspecified: Secondary | ICD-10-CM

## 2015-10-15 DIAGNOSIS — M545 Low back pain: Secondary | ICD-10-CM

## 2015-10-23 ENCOUNTER — Ambulatory Visit (INDEPENDENT_AMBULATORY_CARE_PROVIDER_SITE_OTHER): Payer: Medicare Other

## 2015-10-23 DIAGNOSIS — M810 Age-related osteoporosis without current pathological fracture: Secondary | ICD-10-CM | POA: Diagnosis not present

## 2015-10-23 MED ORDER — DENOSUMAB 60 MG/ML ~~LOC~~ SOLN
60.0000 mg | Freq: Once | SUBCUTANEOUS | Status: AC
  Administered 2015-10-23: 60 mg via SUBCUTANEOUS

## 2015-11-13 ENCOUNTER — Telehealth: Payer: Self-pay | Admitting: *Deleted

## 2015-11-16 ENCOUNTER — Ambulatory Visit (INDEPENDENT_AMBULATORY_CARE_PROVIDER_SITE_OTHER): Payer: Medicare Other | Admitting: Internal Medicine

## 2015-11-16 ENCOUNTER — Encounter: Payer: Self-pay | Admitting: Internal Medicine

## 2015-11-16 VITALS — BP 120/70 | HR 81 | Temp 98.1°F | Wt 170.0 lb

## 2015-11-16 DIAGNOSIS — R300 Dysuria: Secondary | ICD-10-CM | POA: Diagnosis not present

## 2015-11-16 DIAGNOSIS — N8110 Cystocele, unspecified: Secondary | ICD-10-CM

## 2015-11-16 DIAGNOSIS — IMO0001 Reserved for inherently not codable concepts without codable children: Secondary | ICD-10-CM

## 2015-11-16 DIAGNOSIS — M5416 Radiculopathy, lumbar region: Secondary | ICD-10-CM

## 2015-11-16 LAB — POCT URINALYSIS DIPSTICK
Bilirubin, UA: NEGATIVE
Blood, UA: NEGATIVE
Glucose, UA: NEGATIVE
Ketones, UA: NEGATIVE
Nitrite, UA: POSITIVE
Protein, UA: NEGATIVE
Spec Grav, UA: <=1.005
Urobilinogen, UA: NEGATIVE
pH, UA: 8.5

## 2015-11-16 MED ORDER — CIPROFLOXACIN HCL 500 MG PO TABS: 500.0000 mg | ORAL_TABLET | Freq: Two times a day (BID) | ORAL | Status: DC

## 2015-11-16 NOTE — Progress Notes (Signed)
Patient ID: Rebecca Guzman, female   DOB: May 22, 1934, 82 y.o.   MRN: [id number]   Location:  [facility] clinic Provider: Charon Smedberg [name], D.O., C.M.D.  Goals of Care:  Advanced Directives 11/16/2015  Does patient have an advance directive? No  Type of Advance Directive -  Does patient want to make changes to advanced directive? -  Copy of advanced directive(s) in chart? -  Would patient like information on creating an advanced directive? -     Chief Complaint  Patient presents with  . Acute Visit    UTI    HPI: Patient is a 82 y.o. female seen today for an acute visit for acute visit for dysuria, foul smelling urine and ongoing difficulty with bladder prolapse.  UA in Jan had been negative.  Since, she's been getting worse, she says.  Foul odor, dysuria just began today after Thurs and Friday last week it started to smell bad.  Has the prolapse which makes it uncomfortable.  Timing bad--likes to wake her at night.    Had MRI of back and epidural of back.  Has misaligned disc in the curvature of her spine.  Had it two weeks ago.  Helping some.  Not got altogether.  Ureters and bladder appeared slightly distended on the MRI.  Dr. Daphene Jaeger did laparoscopic surgery in the past.    Past Medical History  Diagnosis Date  . GERD (gastroesophageal reflux disease)   . Uterine prolapse     s/p hysterectomy  . Breast cancer ([place]) 1992  . Asthma   . Osteoarthritis of right knee   . Colon polyps     Dr. Lindalou Hose in [place]  . Gallstones   . Hyperlipidemia LDL goal <100   . Bladder prolapse, female, acquired   . Allergic sinusitis   . Tubular adenoma of colon   . Hyperplastic colon polyp     Past Surgical History  Procedure Laterality Date  . Pelvic floor repair    . Laparoscopic assisted vaginal hysterectomy    . Cholecystectomy  1985    Dr. Lindalou Hose (retired)   . Mastectomy  1992    Left  . Colonoscopy  2009    Allergies  Allergen Reactions  . Codeine   . Morphine And Related    Nausea, and drop in blood pressure      Medication List       This list is accurate as of: 11/16/15 12:36 PM.  Always use your most recent med list.               acetaminophen 500 MG tablet  Commonly known as:  TYLENOL  Take 500 mg by mouth every 6 (six) hours as needed.     albuterol 108 (90 Base) MCG/ACT inhaler  Commonly known as:  PROVENTIL HFA;VENTOLIN HFA  Inhale 2 puffs into the lungs as needed for wheezing.     AMBULATORY NON FORMULARY MEDICATION  Mastectomy Bras Dx: C50.919     CALTRATE 600+D PO  Take 2 tablets by mouth daily. Getting 2000 of vit D per day     diclofenac sodium 1 % Gel  Commonly known as:  VOLTAREN  Apply 4 grams to right knee and 2 grams to right shoulder up to four times daily as needed for arthritis pain     fexofenadine 180 MG tablet  Commonly known as:  ALLEGRA  Take 180 mg by mouth daily.     fluticasone 50 MCG/ACT nasal spray  Commonly known as:  FLONASE  multivitamin tablet  Take 1 tablet by mouth daily.     omeprazole 40 MG capsule  Commonly known as:  PRILOSEC  Take 1 capsule (40 mg total) by mouth 2 (two) times daily.     pravastatin 40 MG tablet  Commonly known as:  PRAVACHOL  take 1 tablet by mouth once daily     PROBIOTIC FORMULA PO  Take 1 tablet by mouth daily.        Review of Systems:  Review of Systems  Constitutional: Negative for fever and chills.  Gastrointestinal: Negative for abdominal pain and constipation.  Genitourinary: Positive for dysuria, urgency and frequency.  Musculoskeletal: Positive for myalgias and back pain. Negative for falls.  Neurological: Positive for sensory change. Negative for dizziness.  Psychiatric/Behavioral: Negative for memory loss.    Health Maintenance  Topic Date Due  . INFLUENZA VACCINE  03/08/2016  . TETANUS/TDAP  01/25/2025  . DEXA SCAN  Completed  . ZOSTAVAX  Completed  . PNA vac Low Risk Adult  Completed    Physical Exam: Filed Vitals:   11/16/15 1156    BP: 120/70  Pulse: 81  Temp: 98.1 F (36.7 C)  TempSrc: Oral  Weight: 170 lb (77.111 kg)  SpO2: 98%   Body mass index is 28.29 kg/(m^2). Physical Exam  Constitutional: She appears well-developed and well-nourished. No distress.  Abdominal: Soft. Bowel sounds are normal. She exhibits no distension and no mass. There is no tenderness. There is no rebound and no guarding.  Genitourinary:  No suprapubic tenderness  Musculoskeletal: Normal range of motion. She exhibits tenderness.  Lumbar spine and SI region into buttocks  Skin: Skin is warm and dry.  Psychiatric: She has a normal mood and affect.    Labs reviewed: Basic Metabolic Panel: No results for input(s): NA, K, CL, CO2, GLUCOSE, BUN, CREATININE, CALCIUM, MG, PHOS, TSH in the last 8760 hours. Liver Function Tests: No results for input(s): AST, ALT, ALKPHOS, BILITOT, PROT, ALBUMIN in the last 8760 hours. No results for input(s): LIPASE, AMYLASE in the last 8760 hours. No results for input(s): AMMONIA in the last 8760 hours. CBC: No results for input(s): WBC, NEUTROABS, HGB, HCT, MCV, PLT in the last 8760 hours. Lipid Panel:  Recent Labs  01/19/15 0809  CHOL 183  HDL 60  LDLCALC 100*  TRIG 114  CHOLHDL 3.1   Lab Results  Component Value Date   HGBA1C 5.9* 01/19/2015    Assessment/Plan 1. Dysuria - POC Urinalysis Dipstick was positive for UTI and too bothersome symptoms to wait for cx to return -push fluids, cranberry juice  - ciprofloxacin (CIPRO) 500 MG tablet; Take 1 tablet (500 mg total) by mouth 2 (two) times daily.  Dispense: 14 tablet; Refill: 0 -await c+s  2. Bladder prolapse, female, acquired - Ambulatory referral to Urology  -did previously have bladder tacked up after hysterectomy, she reports, but has dropped down again and now having recurrent UTIs and discomfort as a result, and imaging suggests (MRI of back by chance) she is not fully emptying her bladder  3. Radicular pain of right lower  back -had epidural 2 wks ago--still having some pain, but is improved  Labs/tests ordered:   Orders Placed This Encounter  Procedures  . Ambulatory referral to Urology    Referral Priority:  Routine    Referral Type:  Consultation    Referral Reason:  Specialty Services Required    Requested Specialty:  Urology    Number of Visits Requested:  1  . POC  Urinalysis Dipstick    Next appt:  01/26/2016  Opaline Reyburn L. Kourtnee Lahey, D.O. [facility] [address], [place] [address] Cell Phone (Mon-Fri 8am-5pm):  [phone] On Call:  [phone] & follow prompts after 5pm & weekends Office Phone:  [phone] Office Fax:  [phone]

## 2015-11-16 NOTE — Patient Instructions (Signed)

## 2015-11-17 NOTE — Telephone Encounter (Signed)
Error

## 2015-12-08 ENCOUNTER — Other Ambulatory Visit: Payer: Self-pay | Admitting: Internal Medicine

## 2016-01-19 ENCOUNTER — Other Ambulatory Visit: Payer: Self-pay | Admitting: Urology

## 2016-01-26 ENCOUNTER — Other Ambulatory Visit (INDEPENDENT_AMBULATORY_CARE_PROVIDER_SITE_OTHER): Payer: Medicare Other

## 2016-01-26 DIAGNOSIS — R739 Hyperglycemia, unspecified: Secondary | ICD-10-CM | POA: Diagnosis not present

## 2016-01-26 DIAGNOSIS — K2100 Gastro-esophageal reflux disease with esophagitis, without bleeding: Secondary | ICD-10-CM

## 2016-01-26 DIAGNOSIS — R42 Dizziness and giddiness: Secondary | ICD-10-CM

## 2016-01-26 DIAGNOSIS — J309 Allergic rhinitis, unspecified: Secondary | ICD-10-CM | POA: Diagnosis not present

## 2016-01-26 DIAGNOSIS — E785 Hyperlipidemia, unspecified: Secondary | ICD-10-CM

## 2016-01-27 LAB — COMPREHENSIVE METABOLIC PANEL
ALT: 12 IU/L (ref 0–32)
AST: 16 IU/L (ref 0–40)
Albumin/Globulin Ratio: 1.6 (ref 1.2–2.2)
Albumin: 4.1 g/dL (ref 3.5–4.7)
Alkaline Phosphatase: 63 IU/L (ref 39–117)
BUN/Creatinine Ratio: 18 (ref 12–28)
BUN: 15 mg/dL (ref 8–27)
Bilirubin Total: 0.3 mg/dL (ref 0.0–1.2)
CO2: 23 mmol/L (ref 18–29)
Calcium: 9.2 mg/dL (ref 8.7–10.3)
Chloride: 104 mmol/L (ref 96–106)
Creatinine, Ser: 0.82 mg/dL (ref 0.57–1.00)
GFR calc Af Amer: 78 mL/min/{1.73_m2} (ref >59)
GFR calc non Af Amer: 68 mL/min/{1.73_m2} (ref >59)
Globulin, Total: 2.6 g/dL (ref 1.5–4.5)
Glucose: 92 mg/dL (ref 65–99)
Potassium: 4.4 mmol/L (ref 3.5–5.2)
Sodium: 143 mmol/L (ref 134–144)
Total Protein: 6.7 g/dL (ref 6.0–8.5)

## 2016-01-27 LAB — CBC WITH DIFFERENTIAL/PLATELET
Basophils Absolute: 0 10*3/uL (ref 0.0–0.2)
Basos: 1 %
EOS (ABSOLUTE): 0.9 10*3/uL — ABNORMAL HIGH (ref 0.0–0.4)
Eos: 14 %
Hematocrit: 38.4 % (ref 34.0–46.6)
Hemoglobin: 12.8 g/dL (ref 11.1–15.9)
Immature Grans (Abs): 0 10*3/uL (ref 0.0–0.1)
Immature Granulocytes: 0 %
Lymphocytes Absolute: 1.6 10*3/uL (ref 0.7–3.1)
Lymphs: 25 %
MCH: 30 pg (ref 26.6–33.0)
MCHC: 33.3 g/dL (ref 31.5–35.7)
MCV: 90 fL (ref 79–97)
Monocytes Absolute: 0.5 10*3/uL (ref 0.1–0.9)
Monocytes: 8 %
Neutrophils Absolute: 3.5 10*3/uL (ref 1.4–7.0)
Neutrophils: 52 %
Platelets: 225 10*3/uL (ref 150–379)
RBC: 4.26 x10E6/uL (ref 3.77–5.28)
RDW: 14 % (ref 12.3–15.4)
WBC: 6.6 10*3/uL (ref 3.4–10.8)

## 2016-01-27 LAB — LIPID PANEL
Chol/HDL Ratio: 3.2 ratio units (ref 0.0–4.4)
Cholesterol, Total: 187 mg/dL (ref 100–199)
HDL: 59 mg/dL (ref >39)
LDL Calculated: 102 mg/dL — ABNORMAL HIGH (ref 0–99)
Triglycerides: 129 mg/dL (ref 0–149)
VLDL Cholesterol Cal: 26 mg/dL (ref 5–40)

## 2016-01-27 LAB — HEMOGLOBIN A1C
Est. average glucose Bld gHb Est-mCnc: 114 mg/dL
Hgb A1c MFr Bld: 5.6 % (ref 4.8–5.6)

## 2016-01-28 ENCOUNTER — Ambulatory Visit (INDEPENDENT_AMBULATORY_CARE_PROVIDER_SITE_OTHER): Payer: Medicare Other | Admitting: Internal Medicine

## 2016-01-28 ENCOUNTER — Encounter: Payer: Self-pay | Admitting: Internal Medicine

## 2016-01-28 VITALS — BP 112/62 | HR 81 | Temp 98.1°F | Ht 65.0 in | Wt 170.0 lb

## 2016-01-28 DIAGNOSIS — C50912 Malignant neoplasm of unspecified site of left female breast: Secondary | ICD-10-CM | POA: Diagnosis not present

## 2016-01-28 DIAGNOSIS — M5416 Radiculopathy, lumbar region: Secondary | ICD-10-CM

## 2016-01-28 DIAGNOSIS — N8110 Cystocele, unspecified: Secondary | ICD-10-CM | POA: Diagnosis not present

## 2016-01-28 DIAGNOSIS — M810 Age-related osteoporosis without current pathological fracture: Secondary | ICD-10-CM | POA: Diagnosis not present

## 2016-01-28 DIAGNOSIS — K2100 Gastro-esophageal reflux disease with esophagitis, without bleeding: Secondary | ICD-10-CM

## 2016-01-28 DIAGNOSIS — E785 Hyperlipidemia, unspecified: Secondary | ICD-10-CM | POA: Diagnosis not present

## 2016-01-28 DIAGNOSIS — IMO0001 Reserved for inherently not codable concepts without codable children: Secondary | ICD-10-CM

## 2016-01-28 DIAGNOSIS — Z0000 Encounter for general adult medical examination without abnormal findings: Secondary | ICD-10-CM

## 2016-01-28 NOTE — Progress Notes (Signed)
Location:  [facility] clinic Provider: Romina Divirgilio [name], D.O., C.M.D.  Patient Care Team: [name], DO as PCP - General (Geriatric Medicine)  Extended Emergency Contact Information Primary Emergency Contact: [name],[name] Address: [address]          [place], [place] [address] [name] of [place] Phone: [phone] Mobile Phone: [phone] Relation: Daughter Secondary Emergency Contact: [name],[name] Address: [address]           [place], [place] [address] [name] of [place] Phone: [phone] Mobile Phone: [phone] Relation: Daughter  Code Status: DNR Goals of Care: Advanced Directive information Advanced Directives 01/28/2016  Does patient have an advance directive? No  Would patient like information on creating an advanced directive? -  has the paperwork and had family there to go over it and still didn't do it  Chief Complaint  Patient presents with  . Annual Exam    wellness exam  . MMSE    29/30 passed clock    HPI: Patient is a 82 y.o. female seen in today for an annual wellness exam.    Depression screen [facility] 2/9 01/28/2016 01/26/2015 10/06/2014 01/17/2014 01/17/2014  Decreased Interest 0 0 0 0 0  Down, Depressed, Hopeless 0 0 0 0 0  PHQ - 2 Score 0 0 0 0 0    Fall Risk  01/28/2016 08/14/2015 04/06/2015 01/26/2015 10/06/2014  Falls in the past year? No No No No No   MMSE - Mini Mental State Exam 01/28/2016  Orientation to time 5  Orientation to Place 5  Registration 3  Attention/ Calculation 4  Recall 3  Language- name 2 objects 2  Language- repeat 1  Language- follow 3 step command 3  Language- read & follow direction 1  Write a sentence 1  Copy design 1  Total score 29  passed clock   Health Maintenance  Topic Date Due  . INFLUENZA VACCINE  03/08/2016  . TETANUS/TDAP  01/25/2025  . DEXA SCAN  Completed  . ZOSTAVAX  Completed  . PNA vac Low Risk Adult  Completed    Urinary incontinence?  Yes Functional Status Survey: Is the patient deaf or have difficulty hearing?: Yes Does the patient have difficulty seeing, even when wearing glasses/contacts?: No Does the patient have difficulty concentrating, remembering, or making decisions?: No Does the patient have difficulty walking or climbing stairs?: No Does the patient have difficulty dressing or bathing?: No Does the patient have difficulty doing errands alone such as visiting a doctor's office or shopping?: No Current Exercise Habits: Home exercise routine, Type of exercise: walking, Time (Minutes): 20, Frequency (Times/Week): 4, Weekly Exercise (Minutes/Week): 80, Intensity: Mild Exercise limited by: orthopedic condition(s) Diet? Follows regular diet, no limitations Vision Screening Comments: [facility], pt had cataracts removed due for eye check-up soon Hearing: some difficulty, needs new hearing aides Dentition: Pain: pinched nerve and slipped disc--was treated with epidural which helped some and now it's tolerable.  He didn't think surgery was an option for her.  Has to stop doing things that aggravate it like painting baseboards.  Bending over seems to aggravate it the most.    Went to urology re: bladder--had bladder testing and definitely needs bladder tacked up--will use tissue to tack to pelvic bones.  Says it lasts 2-3 years.  Has the procedure upcoming.    Reviewed labs with her.  Is due her mammogram and bone density.  Dr. Lorin Mercy recommended she  hold off on more prolia until 18 mos after the last one, then get two again, and wait 18 mos, etc.    Past Medical History  Diagnosis Date  . GERD (gastroesophageal reflux disease)   . Uterine prolapse     s/p hysterectomy  . Breast cancer ([place]) 1992  . Asthma   . Osteoarthritis of right knee   . Colon polyps     Dr. Lindalou Hose in [place]  . Gallstones   . Hyperlipidemia LDL goal <100   . Bladder prolapse, female, acquired   . Allergic sinusitis   . Tubular  adenoma of colon   . Hyperplastic colon polyp     Past Surgical History  Procedure Laterality Date  . Pelvic floor repair    . Laparoscopic assisted vaginal hysterectomy    . Cholecystectomy  1985    Dr. Lindalou Hose (retired)   . Mastectomy  1992    Left  . Colonoscopy  2009    Social History   Social History  . Marital Status: Widowed    Spouse Name: N/A  . Number of Children: 4  . Years of Education: N/A   Occupational History  . Retired Pharmacist, hospital    Social History Main Topics  . Smoking status: Former Smoker -- 13 years    Types: Cigarettes    Quit date: 10/12/1983  . Smokeless tobacco: Never Used  . Alcohol Use: No  . Drug Use: No  . Sexual Activity: Not Currently   Other Topics Concern  . Not on file   Social History Narrative    Allergies  Allergen Reactions  . Codeine   . Morphine And Related     Nausea, and drop in blood pressure      Medication List       This list is accurate as of: 01/28/16  2:32 PM.  Always use your most recent med list.               acetaminophen 500 MG tablet  Commonly known as:  TYLENOL  Take 500 mg by mouth every 6 (six) hours as needed.     albuterol 108 (90 Base) MCG/ACT inhaler  Commonly known as:  PROVENTIL HFA;VENTOLIN HFA  Inhale 2 puffs into the lungs as needed for wheezing.     CALTRATE 600+D PO  Take 2 tablets by mouth daily. Getting 2000 of vit D per day     fexofenadine 180 MG tablet  Commonly known as:  ALLEGRA  Take 180 mg by mouth daily.     fluticasone 50 MCG/ACT nasal spray  Commonly known as:  FLONASE     multivitamin tablet  Take 1 tablet by mouth daily.     omeprazole 40 MG capsule  Commonly known as:  PRILOSEC  Take 1 capsule (40 mg total) by mouth 2 (two) times daily.     pravastatin 40 MG tablet  Commonly known as:  PRAVACHOL  TAKE 1 TABLET BY MOUTH ONCE DAILY     PROBIOTIC FORMULA PO  Take 1 tablet by mouth daily.         Review of Systems:  Review of Systems   Constitutional: Negative for fever and chills.  HENT: Negative for congestion.   Eyes: Negative for blurred vision.  Respiratory: Negative for cough and shortness of breath.   Cardiovascular: Negative for chest pain, palpitations and leg swelling.  Gastrointestinal: Negative for abdominal pain, constipation, blood in stool and melena.  Genitourinary: Positive for urgency and frequency. Negative for dysuria,  hematuria and flank pain.  Musculoskeletal: Positive for myalgias and back pain. Negative for joint pain and falls.  Skin: Negative for rash.  Neurological: Negative for dizziness, loss of consciousness and headaches.  Psychiatric/Behavioral: Negative for depression and memory loss. The patient does not have insomnia.     Physical Exam: Filed Vitals:   01/28/16 1350  BP: 112/62  Pulse: 81  Temp: 98.1 F (36.7 C)  TempSrc: Oral  Height: 5\' 5"  (1.651 m)  Weight: 170 lb (77.111 kg)  SpO2: 98%   Body mass index is 28.29 kg/(m^2). Physical Exam  Constitutional: She is oriented to person, place, and time. She appears well-developed and well-nourished. No distress.  HENT:  Head: Normocephalic and atraumatic.  Right Ear: External ear normal.  Left Ear: External ear normal.  Nose: Nose normal.  Mouth/Throat: Oropharynx is clear and moist. No oropharyngeal exudate.  Eyes: Conjunctivae and EOM are normal. Pupils are equal, round, and reactive to light.  Neck: Normal range of motion. Neck supple. No JVD present. No tracheal deviation present. No thyromegaly present.  Cardiovascular: Normal rate, regular rhythm, normal heart sounds and intact distal pulses.   Pulmonary/Chest: Effort normal and breath sounds normal. No respiratory distress. She has no wheezes. Right breast exhibits no inverted nipple, no mass, no nipple discharge, no skin change and no tenderness.  Mastectomy left   Abdominal: Soft. Bowel sounds are normal. She exhibits no distension and no mass. There is no tenderness.  There is no rebound and no guarding. No hernia.  Musculoskeletal: She exhibits tenderness. She exhibits no edema.  Buttock and winces when bends over  Lymphadenopathy:    She has no cervical adenopathy.  Neurological: She is alert and oriented to person, place, and time. She has normal reflexes. No cranial nerve deficit.  Skin: Skin is warm and dry.  Psychiatric: She has a normal mood and affect. Her behavior is normal. Judgment and thought content normal.    Labs reviewed: Basic Metabolic Panel:  Recent Labs  01/26/16 0802  NA 143  K 4.4  CL 104  CO2 23  GLUCOSE 92  BUN 15  CREATININE 0.82  CALCIUM 9.2   Liver Function Tests:  Recent Labs  01/26/16 0802  AST 16  ALT 12  ALKPHOS 63  BILITOT 0.3  PROT 6.7  ALBUMIN 4.1   No results for input(s): LIPASE, AMYLASE in the last 8760 hours. No results for input(s): AMMONIA in the last 8760 hours. CBC:  Recent Labs  01/26/16 0802  WBC 6.6  NEUTROABS 3.5  HCT 38.4  MCV 90  PLT 225   Lipid Panel:  Recent Labs  01/26/16 0802  CHOL 187  HDL 59  LDLCALC 102*  TRIG 129  CHOLHDL 3.2   Lab Results  Component Value Date   HGBA1C 5.6 01/26/2016    Procedures: EKG today NSR at 83 bpm, no acute ischemia or infarct and unchanged from 09/2014  Assessment/Plan 1. Medicare annual wellness visit, subsequent -see hpi for details, up to date on preventive care  2. Hyperlipidemia LDL goal <100 - lipids ok with pravachol--cont - EKG 12-Lead ok as above  3. Radicular pain of right lower back -continues to bother her 10/10 when sitting up on a hard surface or bends over, only a dull ache otherwise -using tylenol and heat -had injection with some improvement  4. Bladder prolapse, female, acquired -is for surgery for this which has recurred  5. Senile osteoporosis -bone density in 01/30/14, cont weight bearing and ca with  D, discussed that this is due again, but she is dealing with her prolapse and back right now so  holding off  6. Malignant neoplasm of left female breast, unspecified site of breast ([place]) -s/p left mastectomy, recent normal right Mammogram  7. Gastroesophageal reflux disease with esophagitis -stable on current medication  Labs/tests ordered:   Orders Placed This Encounter  Procedures  . EKG 12-Lead    Next appt:  05/30/2016 for med mgt, f/u on bladder and back   Khyan Oats L. Ciria Bernardini, D.O. [facility] [address], [place] [address] Cell Phone (Mon-Fri 8am-5pm):  [phone] On Call:  [phone] & follow prompts after 5pm & weekends Office Phone:  [phone] Office Fax:  [phone]

## 2016-02-15 ENCOUNTER — Encounter: Payer: Self-pay | Admitting: Nurse Practitioner

## 2016-02-15 ENCOUNTER — Ambulatory Visit (INDEPENDENT_AMBULATORY_CARE_PROVIDER_SITE_OTHER): Payer: Medicare Other | Admitting: Nurse Practitioner

## 2016-02-15 VITALS — BP 124/80 | HR 87 | Temp 97.5°F | Resp 17

## 2016-02-15 DIAGNOSIS — R8290 Unspecified abnormal findings in urine: Secondary | ICD-10-CM | POA: Diagnosis not present

## 2016-02-15 DIAGNOSIS — R300 Dysuria: Secondary | ICD-10-CM

## 2016-02-15 DIAGNOSIS — R82998 Other abnormal findings in urine: Secondary | ICD-10-CM

## 2016-02-15 DIAGNOSIS — N390 Urinary tract infection, site not specified: Secondary | ICD-10-CM | POA: Diagnosis not present

## 2016-02-15 DIAGNOSIS — H8110 Benign paroxysmal vertigo, unspecified ear: Secondary | ICD-10-CM

## 2016-02-15 LAB — POCT URINALYSIS DIPSTICK
Glucose, UA: NEGATIVE
KETONES UA: NEGATIVE
Nitrite, UA: POSITIVE
PROTEIN UA: NEGATIVE
SPEC GRAV UA: 1.01
Urobilinogen, UA: 0.2
pH, UA: 7.5

## 2016-02-15 NOTE — Progress Notes (Signed)
Patient ID: Rebecca Guzman, female   DOB: 1933/10/26, 82 y.o.   MRN: [id number]    PCP: [name], DO  Advanced Directive information Does patient have an advance directive?: No, Would patient like information on creating an advanced directive?: No - patient declined information  Allergies  Allergen Reactions  . Codeine   . Morphine And Related     Nausea, and drop in blood pressure    Chief Complaint  Patient presents with  . Acute Visit    Patient had vertigo attack on Saturday that lasted 8 hours. Felt very weak and lightheaded on Sunday, feels somewhat weak today.   . OTHER    Wants to be checked for a UTI due to foul odor x 1 week to 10 days.      HPI: Patient is a 82 y.o. female seen in the office today due to not feeling well. 3 days ago had a vertigo episode where she was nausea and vomited x 1 which lasted about 8 hours and then stopped but then felt weak and tired. Now feeling foggy and weak, better today then yesterday.  Also worried about UTI. Reports very foul odor and last time had infection. Was treated with cipro and it resolved. Has bladder surgery scheduled in 2 weeks due to bladder prolapse and does not empty like it should.  No nausea or vomiting.  Reports she is feeling better than she did. Drinking more water and eating better No fevers or chills.  No pain when she urinates, no abdominal pain, no frequency.    Review of Systems:  Review of Systems  Constitutional: Positive for fatigue. Negative for activity change, appetite change and unexpected weight change.  Eyes: Negative.   Respiratory: Negative for cough and shortness of breath.   Cardiovascular: Negative for chest pain, palpitations and leg swelling.  Gastrointestinal: Negative for abdominal pain, diarrhea and constipation.  Genitourinary: Negative for dysuria, urgency, frequency, flank pain and difficulty urinating.  Musculoskeletal: Negative for myalgias and arthralgias.  Skin: Negative for  color change and wound.  Neurological: Positive for weakness (improving). Negative for dizziness, tremors, speech difficulty, light-headedness, numbness and headaches.  Psychiatric/Behavioral: Negative for behavioral problems, confusion and agitation.    Past Medical History  Diagnosis Date  . GERD (gastroesophageal reflux disease)   . Uterine prolapse     s/p hysterectomy  . Breast cancer ([place]) 1992  . Asthma   . Osteoarthritis of right knee   . Colon polyps     Dr. Lindalou Hose in [place]  . Gallstones   . Hyperlipidemia LDL goal <100   . Bladder prolapse, female, acquired   . Allergic sinusitis   . Tubular adenoma of colon   . Hyperplastic colon polyp    Past Surgical History  Procedure Laterality Date  . Pelvic floor repair    . Laparoscopic assisted vaginal hysterectomy    . Cholecystectomy  1985    Dr. Lindalou Hose (retired)   . Mastectomy  1992    Left  . Colonoscopy  2009   Social History:   reports that she quit smoking about 32 years ago. Her smoking use included Cigarettes. She quit after 13 years of use. She has never used smokeless tobacco. She reports that she does not drink alcohol or use illicit drugs.  Family History  Problem Relation Age of Onset  . Heart attack Mother   . Colon cancer Mother   . Hypertension Mother   . Heart disease Father   . Breast cancer  Sister     Medications: Patient's Medications  New Prescriptions   No medications on file  Previous Medications   ACETAMINOPHEN (TYLENOL) 500 MG TABLET    Take 500 mg by mouth every 6 (six) hours as needed.   ALBUTEROL (PROVENTIL HFA;VENTOLIN HFA) 108 (90 BASE) MCG/ACT INHALER    Inhale 2 puffs into the lungs as needed for wheezing.   CALCIUM CARBONATE-VITAMIN D (CALTRATE 600+D PO)    Take 2 tablets by mouth daily. Getting 2000 of vit D per day   FEXOFENADINE (ALLEGRA) 180 MG TABLET    Take 180 mg by mouth daily.   FLUTICASONE (FLONASE) 50 MCG/ACT NASAL SPRAY       MULTIPLE VITAMIN (MULTIVITAMIN)  TABLET    Take 1 tablet by mouth daily.   OMEPRAZOLE (PRILOSEC) 40 MG CAPSULE    Take 1 capsule (40 mg total) by mouth 2 (two) times daily.   PRAVASTATIN (PRAVACHOL) 40 MG TABLET    TAKE 1 TABLET BY MOUTH ONCE DAILY   PROBIOTIC PRODUCT (PROBIOTIC FORMULA PO)    Take 1 tablet by mouth daily.  Modified Medications   No medications on file  Discontinued Medications   No medications on file     Physical Exam:  Filed Vitals:   02/15/16 1553  BP: 124/80  Pulse: 87  Temp: 97.5 F (36.4 C)  Resp: 17   There is no weight on file to calculate BMI.  Physical Exam  Constitutional: She appears well-developed and well-nourished. No distress.  Cardiovascular: Normal rate, regular rhythm and normal heart sounds.   Pulmonary/Chest: Effort normal and breath sounds normal.  Abdominal: Soft. Bowel sounds are normal. She exhibits no distension and no mass. There is no tenderness. There is no rebound and no guarding.  Genitourinary:  No suprapubic tenderness  Musculoskeletal: Normal range of motion. She exhibits no tenderness.  Skin: Skin is warm and dry.  Psychiatric: She has a normal mood and affect.    Labs reviewed: Basic Metabolic Panel:  Recent Labs  01/26/16 0802  NA 143  K 4.4  CL 104  CO2 23  GLUCOSE 92  BUN 15  CREATININE 0.82  CALCIUM 9.2   Liver Function Tests:  Recent Labs  01/26/16 0802  AST 16  ALT 12  ALKPHOS 63  BILITOT 0.3  PROT 6.7  ALBUMIN 4.1   No results for input(s): LIPASE, AMYLASE in the last 8760 hours. No results for input(s): AMMONIA in the last 8760 hours. CBC:  Recent Labs  01/26/16 0802  WBC 6.6  NEUTROABS 3.5  HCT 38.4  MCV 90  PLT 225   Lipid Panel:  Recent Labs  01/26/16 0802  CHOL 187  HDL 59  LDLCALC 102*  TRIG 129  CHOLHDL 3.2   TSH: No results for input(s): TSH in the last 8760 hours. A1C: Lab Results  Component Value Date   HGBA1C 5.6 01/26/2016     Assessment/Plan 1. Bad odor of urine -UA showing  leukocytosis and nitrates, without dysuria  -to increase hydration   2. Urine leukocytes - POCT urinalysis dipstick with leukocytes  - will send for culture  3. BPPV (benign paroxysmal positional vertigo), unspecified laterality No ongoing dizziness or other symptoms -cont to stay hydrated, proper nutrition    Senica Crall K. Harle Battiest  [facility] [phone] 8 am - 5 pm) [phone] (after hours)

## 2016-02-15 NOTE — Patient Instructions (Signed)
We will send your urine for culture, stay hydrated Follow up as needed

## 2016-02-17 LAB — URINE CULTURE: Colony Count: 50000

## 2016-02-19 ENCOUNTER — Encounter (HOSPITAL_BASED_OUTPATIENT_CLINIC_OR_DEPARTMENT_OTHER): Payer: Self-pay | Admitting: *Deleted

## 2016-02-23 ENCOUNTER — Other Ambulatory Visit: Payer: Self-pay | Admitting: *Deleted

## 2016-02-23 DIAGNOSIS — R1013 Epigastric pain: Secondary | ICD-10-CM

## 2016-02-23 DIAGNOSIS — R079 Chest pain, unspecified: Secondary | ICD-10-CM

## 2016-02-23 DIAGNOSIS — K219 Gastro-esophageal reflux disease without esophagitis: Secondary | ICD-10-CM

## 2016-02-23 MED ORDER — OMEPRAZOLE 40 MG PO CPDR: 40.0000 mg | DELAYED_RELEASE_CAPSULE | Freq: Two times a day (BID) | ORAL | Status: DC

## 2016-02-23 NOTE — Telephone Encounter (Signed)
Patient requested refill on medication.

## 2016-02-24 ENCOUNTER — Encounter (HOSPITAL_BASED_OUTPATIENT_CLINIC_OR_DEPARTMENT_OTHER): Payer: Self-pay | Admitting: *Deleted

## 2016-02-24 NOTE — Progress Notes (Signed)
NPO AFTER MN.  ARRIVE AT 0715.  NEEDS HG.  WILL TAKE ALLEGRA AND PRILOSEC AM DOS W/ SIPS OF WATER. WILL DO FLEET ENEMA HS BEFORE DOS.

## 2016-02-26 ENCOUNTER — Other Ambulatory Visit: Payer: Self-pay | Admitting: Internal Medicine

## 2016-02-26 ENCOUNTER — Telehealth: Payer: Self-pay | Admitting: *Deleted

## 2016-02-26 DIAGNOSIS — Z1231 Encounter for screening mammogram for malignant neoplasm of breast: Secondary | ICD-10-CM

## 2016-02-26 DIAGNOSIS — M810 Age-related osteoporosis without current pathological fracture: Secondary | ICD-10-CM

## 2016-02-26 NOTE — Telephone Encounter (Signed)
Patient notified

## 2016-02-26 NOTE — Telephone Encounter (Signed)
Patient called and stated that you told her to get a Bone Density when she gets a Mammogram. She has an appointment on 04/04/2016 for a mammogram at the [facility]. She needs a referral. Please Advise.

## 2016-02-26 NOTE — Telephone Encounter (Signed)
Referral was placed 

## 2016-02-28 NOTE — H&P (Signed)
Office Visit Report     01/15/2016   --------------------------------------------------------------------------------   Rebecca Guzman  MRN: [id number]  PRIMARY CARE:    DOB: 17-Dec-1933, 82 year old Female  REFERRING:  Rebecca L. Rebecca Clonts, DO  SSN: [id number]  PROVIDER:  Carolan Guzman, M.D.    LOCATION:  [facility] [phone]   --------------------------------------------------------------------------------   CC/HPI: Rebecca Guzman is a 82 year old female, last seen in 2001, referred back by Rebecca Guzman, D.O., for evaluation of dysuria, and bladder prolapse. She notices coincidental bowel movements with voiding. She does not need to push her prolapse back in order to void, or have a bowel movement (splinting). She has no constipation. She is para 4-4-0. No tobacco history. She has nocturia 2. She complains of urinary incontinence with awakening, and is unable to stop urinating. This urination voids to completion. She denies stress urinary incontinence. She is status post anterior vault repair and retropubic fascia lata sling in January 10, 2000. She is also status post coincidental hysterectomy and posterior repair by Dr. Cletis Media at the same time.   Note her allergies to codeine, and morphine.   Note her past history of breast cancer, and GERD.   She is a former smoker, with a 13-pack-year history oftobacco use. Her review of systems is significant for urinary urgency, nocturia, urge incontinence, urinary hesitancy, intermittency, incomplete bladder emptying, difficulty initiating her urinary stream with urinary straining. She denies dysuria or vaginal discharge, however. Her BMI is 28.29. She is 5 foot 5 inches with 170 pound weight. Her physical examination shows that she has a cystocele present with a midline defect grade 3 over 4. There is no enterocele or rectocele identified.   Her [name] test is negative, and Q-tip test is negative. She is known to have a 500 cc bladder capacity  in the office clinically.   Urodynamics shows that the patient has first sensation at 194 cc, with normal desire at 611 cc and strong desire at 707 cc. There is an unstable bladder contraction occurring at 512 cc with an unstable bladder contraction pressure of 15 L of water pressure, with mild urinary leakage.   Valsalva leak point pressureis noted to be non-existent with an abdominal pressure generation of 115 cm water pressure. However, the patient did leak a small amount after coughing with 500 cc in her bladder. It appears however, that this was triggered by an unstable bladder contraction and was not recorded as stress urinary incontinence.   Pressure flow study shows that the patient has a voluntary contraction generated with a voided volume of 80 cc, and maximum flow rate of 4 cc/s. The detrusor pressure at maximum flow was 44 cm of water pressure. The maximum detrusor pressure was 50 cm of water pressure. Post void residual is 6 35 cc. The patient initially voids with the vaginal packing in place. It is removed when her flow stops to see if she could void more. She does try, but is not able to void more.   VCUG is accomplished, and shows that the patient has no bladder stones. There is no reflux noted.   It would appear that the patient has a maximum capacity of greater than 700 cc, with no true stress urinary incontinence. She does have a small amount of incontinence with an unstable bladder contraction. She has significant anterior vault prolapse. She does not leak for stress incontinence with or without the prolapse packed.   The patient has a voluntary contraction, but is only able  to void a maximum 80 cc. With the packing removed, she was still not able to improve her voiding ability. She has a large residual is 6 or 35 cc. Trabeculation was identified but no reflux was seen.     CC: I leak when I have the urge to urinate.  HPI: Rebecca Guzman is a 82 year-old female established patient  who is here for urge incontinence.  She does have problems getting to the bathroom in time after she has the urge to urinate. She has had an accident when she couldn't get to the bathroom in time . Her symptoms have gotten worse over the last year.   She does wear protective pads. She wears 1-2 pads per day. She generally urinates every 4 hours in the daytime. She gets up at night to urinate 2 times. She is having problems with emptying her bladder well.     CC: I have pelvic prolapse.  HPI: Her symptoms have been worse over the last year. She does not have pelvic pain.   She does not have an abnormal sensation when needing to urinate. She is urinating more frequently now than usual. She does have a good size and strength to her urinary stream. She is having problems with emptying her bladder well.   She is having problems with urinary control or incontinence. She does wear protective pads. She uses 1-2 per day. She can not get to the bathroom in time when she gets the urge to urinate. She does not leak urine when she coughs, laughs, sneezes or bears down.     ALLERGIES: Codeine Derivatives Morphine Derivatives    MEDICATIONS: Allegra 180 MG TABS Oral  Calcium 600 TABS Oral  Flonase SUSP Nasal  Multiple Vitamin TABS Oral  Omeprazole 40 MG Oral Capsule Delayed Release Oral  Pravastatin Sodium 40 MG Oral Tablet Oral  Probiotic Oral Capsule Oral  Prolia 60 MG/ML Subcutaneous Solution Subcutaneous  Tylenol CAPS Oral  Vitamin D 2000 UNIT Oral Tablet Oral     GU PSH: Bladder Repair - 01/06/2016 Complex cystometrogram, w/ void pressure and urethral pressure profile studies, any technique - 01/11/2016 Complex Uroflow - 01/11/2016 Emg surf Electrd - 01/11/2016 Hysterectomy Unilat SO - 01/06/2016 Inject For cystogram - 01/11/2016 Intrabd voidng Press - 01/11/2016 Repair of Rectocele - 01/06/2016 Sling - 01/06/2016      PSH Notes: Breast Surgery Mastectomy, Anterior Colporrhaphy, Repair Of  Cystocele, Vaginal Sling Operation For Stress Incontinence, Posterior Colporrhaphy (For Pelvic Relaxation), Cholecystectomy, Hysterectomy   NON-GU PSH: Cholecystectomy - 01/06/2016    GU PMH: Urge incontinence - 01/13/2016, Urge incontinence of urine, - 01/06/2016 Cystocele, Unspec, Pelvic organ prolapse quantification stage 3 cystocele - 01/06/2016 Other female genital prolapse, Pelvic floor relaxation - 01/06/2016    NON-GU PMH: Encounter for general adult medical examination without abnormal findings, Encounter for preventive health examination - 01/06/2016 Personal history of malignant neoplasm of breast, History of breast cancer - 01/06/2016 Personal history of other diseases of the digestive system, History of esophageal reflux - 01/06/2016 Personal history of other diseases of the musculoskeletal system and connective tissue, History of arthritis - 01/06/2016 Personal history of other diseases of the respiratory system, History of asthma - 01/06/2016 Personal history of other endocrine, nutritional and metabolic disease, History of hypercholesterolemia - 01/06/2016    FAMILY HISTORY: Acute Myocardial Infarction - Runs In Family Colon Cancer - Runs In Family Deceased - Runs In Family Diabetes - Runs In Family   SOCIAL HISTORY: Marital Status: Widowed Patient  does not smoke anymore.  Has not smoked since 01/06/1981.  Smoked for 13 years.  Smoked 1 pack per day.  Has never drank.  Does not drink caffeine.     Notes: No alcohol use, Number of children, Former smoker, Retired, Widowed, Caffeine use   REVIEW OF SYSTEMS:    GU Review Female:   Patient reports get up at night to urinate and leakage of urine. Patient denies frequent urination, hard to postpone urination, burning /pain with urination, stream starts and stops, trouble starting your stream, have to strain to urinate, and currently pregnant.  Gastrointestinal (Upper):   Patient denies nausea, vomiting, and indigestion/ heartburn.   Gastrointestinal (Lower):   Patient denies diarrhea and constipation.  Constitutional:   Patient denies fever, night sweats, weight loss, and fatigue.  Skin:   Patient denies skin rash/ lesion and itching.  Eyes:   Patient denies blurred vision and double vision.  Ears/ Nose/ Throat:   Patient denies sore throat and sinus problems.  Hematologic/Lymphatic:   Patient reports easy bruising. Patient denies swollen glands.  Cardiovascular:   Patient reports leg swelling. Patient denies chest pains.  Respiratory:   Patient reports cough. Patient denies shortness of breath.  Endocrine:   Patient denies excessive thirst.  Musculoskeletal:   Patient reports back pain and joint pain.   Neurological:   Patient denies dizziness and headaches.  Psychologic:   Patient denies depression and anxiety.   VITAL SIGNS:    BP: 124/64 mmHg   Heart Rate: 89 /min   Temp: 98.0 F / 37 C      GU PHYSICAL EXAMINATION:    External Genitalia: No hirsuitism, no rash, no scarring, no cyst, no erythematous lesion, no papular lesion, no blanched lesion, no warty lesion, no labial adhesions, no atrophic introitus. No edema.   Urethral Meatus: Normal size. Normal position. No discharge.   Bladder: Normal to palpation, no tenderness, no mass, normal size. Cystocele, Grade 3  Vagina: Mild vaginal atrophy. Third-degree cystocele. No stenosis. No rectocele. No enterocele.   Cervix: S/P Hysterectomy.   Uterus: S/P Hysterectomy.   Adnexa / Parametria: S/p hysterectomy. No tenderness. No adnexal mass. Normal left ovary. Normal right ovary.    MULTI-SYSTEM PHYSICAL EXAMINATION:    Constitutional: Well-nourished. No physical deformities. Normally developed. Good grooming.   Neck: Neck symmetrical, not swollen. Normal tracheal position.   Respiratory: No labored breathing, no use of accessory muscles.   Cardiovascular: Normal temperature, normal extremity pulses, no swelling, no varicosities.  Lymphatic: No enlargement of  neck, axillae, groin.  Skin: No paleness, no jaundice, no cyanosis. No lesion, no ulcer, no rash.  Neurologic / Psychiatric: Oriented to time, oriented to place, oriented to person. No depression, no anxiety, no agitation.  Gastrointestinal: Obese abdomen. No mass, no tenderness, no rigidity.   Eyes: Normal conjunctivae. Normal eyelids.  Ears, Nose, Mouth, and Throat: Left ear no scars, no lesions, no masses. Right ear no scars, no lesions, no masses. Nose no scars, no lesions, no masses. Normal hearing. Normal lips.   PAST DATA REVIEWED:  Source Of History:  Patient, Outside Source  Urodynamics Review:   Review Bladder Scan, Review Flow Rate, Review Urodynamics Tests   PROCEDURES:          Urinalysis - 81003 Dipstick Dipstick Cont'd  Specimen: Voided Blood: Neg  Appearance: Clear pH: 6.0  Color: Yellow Protein: Neg  Bilirubin: Neg Nitrites: Neg  Ketones: Neg Leukocyte Esterase: Neg  Specific Gravity: 1.010      ASSESSMENT:  ICD-10 Details  1 GU:   Urge incontinence - N39.41   2   Female genital prolapse, unspecified - N81.9           Notes:   82 year old female, status post anterior instructions as well repair dressed and retropubic fascia as well as in 2000 and, with coincidental hysterectomy, and posterior lamella she has a progressive anterior anterior vault prolapse, which is now at the level of the [place]. She has no significant constipation. She does not have stress incontinence. Rather, she has urge incontinence which is induced by coughing. The patient will need to have anterior vault with repair with augmented Axis dermis repair, and sacro- spinous fixation. we have discussed the possibility of using mesh, and I have advised her to have augmented tissue repair at her age. I believe this will give her a satisfactory result. She understands that she will be in the hospital overnight We will use an extensive amount of long-acting local anesthesia. She does not take narcotic  anesthesia because of nausea and vomiting and extreme intolerance. (Drop in blood pressure with narcotic medication nausea. She will be placed on meloxicam postoperatively.    PLAN:           Schedule Return Visit: ASAP - Schedule Surgery  Return Visit: Next Available Appointment - Schedule Surgery          Patient Handouts Provided Patient Education Sheets    Pelvic Floor - 618.0          Document Letter(s):  Created for Patient: Clinical Summary     Signed by Rebecca Guzman, M.D. on 01/15/16 at 4:58 PM (EDT)     The information contained in this medical record document is considered private and confidential patient information. This information can only be used for the medical diagnosis and/or medical services that are being provided by the patient's selected caregivers. This information can only be distributed outside of the patient's care if the patient agrees and signs waivers of authorization for this information to be sent to an outside source or route.

## 2016-02-29 ENCOUNTER — Encounter (HOSPITAL_BASED_OUTPATIENT_CLINIC_OR_DEPARTMENT_OTHER): Payer: Self-pay

## 2016-02-29 ENCOUNTER — Ambulatory Visit (HOSPITAL_BASED_OUTPATIENT_CLINIC_OR_DEPARTMENT_OTHER): Payer: Medicare Other | Admitting: Anesthesiology

## 2016-02-29 ENCOUNTER — Observation Stay (HOSPITAL_BASED_OUTPATIENT_CLINIC_OR_DEPARTMENT_OTHER)
Admission: RE | Admit: 2016-02-29 | Discharge: 2016-03-01 | Disposition: A | Discharge status: Home / Self Care | Payer: Medicare Other | Source: Ambulatory Visit | Attending: Urology | Admitting: Urology

## 2016-02-29 ENCOUNTER — Encounter (HOSPITAL_COMMUNITY)
Admission: RE | Disposition: A | Discharge status: Home / Self Care | Payer: Self-pay | Source: Ambulatory Visit | Attending: Urology

## 2016-02-29 DIAGNOSIS — Z79899 Other long term (current) drug therapy: Secondary | ICD-10-CM | POA: Insufficient documentation

## 2016-02-29 DIAGNOSIS — Z87891 Personal history of nicotine dependence: Secondary | ICD-10-CM | POA: Diagnosis not present

## 2016-02-29 DIAGNOSIS — N8111 Cystocele, midline: Secondary | ICD-10-CM | POA: Diagnosis not present

## 2016-02-29 DIAGNOSIS — N819 Female genital prolapse, unspecified: Secondary | ICD-10-CM | POA: Diagnosis present

## 2016-02-29 DIAGNOSIS — M1990 Unspecified osteoarthritis, unspecified site: Secondary | ICD-10-CM | POA: Insufficient documentation

## 2016-02-29 DIAGNOSIS — Z7951 Long term (current) use of inhaled steroids: Secondary | ICD-10-CM | POA: Diagnosis not present

## 2016-02-29 DIAGNOSIS — N3941 Urge incontinence: Secondary | ICD-10-CM | POA: Diagnosis not present

## 2016-02-29 DIAGNOSIS — N8189 Other female genital prolapse: Principal | ICD-10-CM | POA: Insufficient documentation

## 2016-02-29 DIAGNOSIS — E7800 Pure hypercholesterolemia, unspecified: Secondary | ICD-10-CM | POA: Diagnosis not present

## 2016-02-29 DIAGNOSIS — K219 Gastro-esophageal reflux disease without esophagitis: Secondary | ICD-10-CM | POA: Insufficient documentation

## 2016-02-29 HISTORY — PX: CYSTOCELE REPAIR: SHX163

## 2016-02-29 HISTORY — DX: Personal history of colonic polyps: Z86.010

## 2016-02-29 HISTORY — DX: Nausea with vomiting, unspecified: R11.2

## 2016-02-29 HISTORY — DX: Presence of external hearing-aid: Z97.4

## 2016-02-29 HISTORY — DX: Diaphragmatic hernia without obstruction or gangrene: K44.9

## 2016-02-29 HISTORY — DX: Other specified postprocedural states: Z98.890

## 2016-02-29 HISTORY — DX: Other symptoms and signs involving the genitourinary system: R39.89

## 2016-02-29 HISTORY — DX: Benign paroxysmal vertigo, unspecified ear: H81.10

## 2016-02-29 HISTORY — DX: Personal history of adenomatous and serrated colon polyps: Z86.0101

## 2016-02-29 HISTORY — DX: Female genital prolapse, unspecified: N81.9

## 2016-02-29 HISTORY — DX: Personal history of malignant neoplasm of breast: Z85.3

## 2016-02-29 HISTORY — DX: Presence of spectacles and contact lenses: Z97.3

## 2016-02-29 LAB — CBC
HEMATOCRIT: 37.4 % (ref 36.0–46.0)
HEMOGLOBIN: 12.6 g/dL (ref 12.0–15.0)
MCH: 30.5 pg (ref 26.0–34.0)
MCHC: 33.7 g/dL (ref 30.0–36.0)
MCV: 90.6 fL (ref 78.0–100.0)
Platelets: 212 10*3/uL (ref 150–400)
RBC: 4.13 MIL/uL (ref 3.87–5.11)
RDW: 13 % (ref 11.5–15.5)
WBC: 13.5 10*3/uL — ABNORMAL HIGH (ref 4.0–10.5)

## 2016-02-29 LAB — BASIC METABOLIC PANEL
Anion gap: 6 (ref 5–15)
BUN: 10 mg/dL (ref 6–20)
CALCIUM: 8.3 mg/dL — AB (ref 8.9–10.3)
CHLORIDE: 106 mmol/L (ref 101–111)
CO2: 25 mmol/L (ref 22–32)
CREATININE: 0.75 mg/dL (ref 0.44–1.00)
GFR calc Af Amer: >60 mL/min (ref >60)
GFR calc non Af Amer: >60 mL/min (ref >60)
Glucose, Bld: 131 mg/dL — ABNORMAL HIGH (ref 65–99)
Potassium: 3.9 mmol/L (ref 3.5–5.1)
Sodium: 137 mmol/L (ref 135–145)

## 2016-02-29 LAB — POCT HEMOGLOBIN-HEMACUE: Hemoglobin: 13.3 g/dL (ref 12.0–15.0)

## 2016-02-29 SURGERY — COLPORRHAPHY, ANTERIOR, FOR CYSTOCELE REPAIR
Anesthesia: General | Site: Vagina

## 2016-02-29 MED ORDER — SODIUM CHLORIDE 0.9 % IR SOLN
Status: DC | PRN
  Administered 2016-02-29: 500 mL

## 2016-02-29 MED ORDER — BUPIVACAINE-EPINEPHRINE 0.5% -1:200000 IJ SOLN
INTRAMUSCULAR | Status: DC | PRN
  Administered 2016-02-29: 20 mL

## 2016-02-29 MED ORDER — ACETAMINOPHEN 500 MG PO TABS
1000.0000 mg | ORAL_TABLET | Freq: Four times a day (QID) | ORAL | Status: DC | PRN
  Administered 2016-02-29: 1000 mg via ORAL
  Filled 2016-02-29: qty 2

## 2016-02-29 MED ORDER — DOCUSATE SODIUM 100 MG PO CAPS
100.0000 mg | ORAL_CAPSULE | Freq: Two times a day (BID) | ORAL | Status: DC
  Administered 2016-02-29 – 2016-03-01 (×2): 100 mg via ORAL
  Filled 2016-02-29 (×2): qty 1

## 2016-02-29 MED ORDER — LIDOCAINE 2% (20 MG/ML) 5 ML SYRINGE
INTRAMUSCULAR | Status: DC | PRN
  Administered 2016-02-29: 50 mg via INTRAVENOUS

## 2016-02-29 MED ORDER — ACETAMINOPHEN 500 MG PO TABS
1000.0000 mg | ORAL_TABLET | Freq: Four times a day (QID) | ORAL | Status: AC
  Administered 2016-02-29 – 2016-03-01 (×4): 1000 mg via ORAL
  Filled 2016-02-29 (×4): qty 2

## 2016-02-29 MED ORDER — ALBUTEROL SULFATE (2.5 MG/3ML) 0.083% IN NEBU: 3.0000 mL | INHALATION_SOLUTION | RESPIRATORY_TRACT | Status: DC | PRN

## 2016-02-29 MED ORDER — SODIUM CHLORIDE 0.9 % IJ SOLN
INTRAMUSCULAR | Status: DC | PRN
  Administered 2016-02-29: 50 mL

## 2016-02-29 MED ORDER — DEXAMETHASONE SODIUM PHOSPHATE 4 MG/ML IJ SOLN
INTRAMUSCULAR | Status: DC | PRN
  Administered 2016-02-29: 10 mg via INTRAVENOUS

## 2016-02-29 MED ORDER — SODIUM CHLORIDE 0.9% FLUSH: 3.0000 mL | Freq: Two times a day (BID) | INTRAVENOUS | Status: DC

## 2016-02-29 MED ORDER — SODIUM CHLORIDE 0.9% FLUSH: 3.0000 mL | INTRAVENOUS | Status: DC | PRN

## 2016-02-29 MED ORDER — LACTATED RINGERS IV SOLN
INTRAVENOUS | Status: DC
  Administered 2016-02-29 (×2): via INTRAVENOUS
  Filled 2016-02-29: qty 1000

## 2016-02-29 MED ORDER — LIDOCAINE HCL (CARDIAC) 20 MG/ML IV SOLN
INTRAVENOUS | Status: AC
  Filled 2016-02-29: qty 5

## 2016-02-29 MED ORDER — ONDANSETRON HCL 4 MG/2ML IJ SOLN: 4.0000 mg | INTRAMUSCULAR | Status: DC | PRN

## 2016-02-29 MED ORDER — KETOROLAC TROMETHAMINE 30 MG/ML IJ SOLN
INTRAMUSCULAR | Status: DC | PRN
  Administered 2016-02-29: 15 mg via INTRAVENOUS

## 2016-02-29 MED ORDER — STERILE WATER FOR IRRIGATION IR SOLN
Status: DC | PRN
  Administered 2016-02-29: 500 mL

## 2016-02-29 MED ORDER — MELOXICAM 15 MG PO TABS
15.0000 mg | ORAL_TABLET | Freq: Every day | ORAL | Status: DC
  Administered 2016-02-29: 15 mg via ORAL
  Filled 2016-02-29: qty 1

## 2016-02-29 MED ORDER — DEXAMETHASONE SODIUM PHOSPHATE 10 MG/ML IJ SOLN
INTRAMUSCULAR | Status: AC
  Filled 2016-02-29: qty 1

## 2016-02-29 MED ORDER — LORATADINE 10 MG PO TABS
10.0000 mg | ORAL_TABLET | Freq: Every day | ORAL | Status: DC
  Administered 2016-02-29 – 2016-03-01 (×2): 10 mg via ORAL
  Filled 2016-02-29 (×2): qty 1

## 2016-02-29 MED ORDER — CEFAZOLIN SODIUM-DEXTROSE 2-4 GM/100ML-% IV SOLN
2.0000 g | INTRAVENOUS | Status: AC
  Administered 2016-02-29: 2 g via INTRAVENOUS
  Filled 2016-02-29: qty 100

## 2016-02-29 MED ORDER — FLUTICASONE PROPIONATE 50 MCG/ACT NA SUSP
1.0000 | Freq: Every evening | NASAL | Status: DC
  Administered 2016-02-29: 1 via NASAL
  Filled 2016-02-29: qty 16

## 2016-02-29 MED ORDER — PROPOFOL 10 MG/ML IV BOLUS
INTRAVENOUS | Status: DC | PRN
  Administered 2016-02-29: 150 mg via INTRAVENOUS

## 2016-02-29 MED ORDER — FENTANYL CITRATE (PF) 100 MCG/2ML IJ SOLN
INTRAMUSCULAR | Status: AC
  Filled 2016-02-29: qty 2

## 2016-02-29 MED ORDER — MELOXICAM 15 MG PO TABS: 15.0000 mg | ORAL_TABLET | Freq: Every day | ORAL | 0 refills | Status: DC

## 2016-02-29 MED ORDER — ONDANSETRON HCL 4 MG/2ML IJ SOLN
4.0000 mg | Freq: Once | INTRAMUSCULAR | Status: AC | PRN
  Administered 2016-02-29: 4 mg via INTRAVENOUS
  Filled 2016-02-29: qty 2

## 2016-02-29 MED ORDER — PROPOFOL 10 MG/ML IV BOLUS
INTRAVENOUS | Status: AC
  Filled 2016-02-29: qty 40

## 2016-02-29 MED ORDER — BUPIVACAINE-EPINEPHRINE 0.25% -1:200000 IJ SOLN
70.0000 mL | Freq: Once | INTRAMUSCULAR | Status: DC
  Filled 2016-02-29: qty 70

## 2016-02-29 MED ORDER — FENTANYL CITRATE (PF) 100 MCG/2ML IJ SOLN
INTRAMUSCULAR | Status: DC | PRN
  Administered 2016-02-29 (×2): 25 ug via INTRAVENOUS

## 2016-02-29 MED ORDER — CEFAZOLIN SODIUM-DEXTROSE 2-4 GM/100ML-% IV SOLN
INTRAVENOUS | Status: AC
  Filled 2016-02-29: qty 100

## 2016-02-29 MED ORDER — KETOROLAC TROMETHAMINE 30 MG/ML IJ SOLN
INTRAMUSCULAR | Status: AC
  Filled 2016-02-29: qty 1

## 2016-02-29 MED ORDER — FLUORESCEIN SODIUM 10 % IV SOLN
INTRAVENOUS | Status: AC
  Filled 2016-02-29: qty 5

## 2016-02-29 MED ORDER — FENTANYL CITRATE (PF) 100 MCG/2ML IJ SOLN
25.0000 ug | INTRAMUSCULAR | Status: DC | PRN
  Filled 2016-02-29: qty 1

## 2016-02-29 MED ORDER — MIDAZOLAM HCL 2 MG/2ML IJ SOLN
INTRAMUSCULAR | Status: AC
  Filled 2016-02-29: qty 2

## 2016-02-29 MED ORDER — PANTOPRAZOLE SODIUM 40 MG PO TBEC
40.0000 mg | DELAYED_RELEASE_TABLET | Freq: Every day | ORAL | Status: DC
  Administered 2016-02-29 – 2016-03-01 (×2): 40 mg via ORAL
  Filled 2016-02-29 (×2): qty 1

## 2016-02-29 MED ORDER — SODIUM CHLORIDE 0.9 % IV SOLN: 250.0000 mL | INTRAVENOUS | Status: DC | PRN

## 2016-02-29 MED ORDER — KETOROLAC TROMETHAMINE 30 MG/ML IJ SOLN
30.0000 mg | Freq: Once | INTRAMUSCULAR | Status: AC
  Administered 2016-02-29: 30 mg via INTRAVENOUS
  Filled 2016-02-29: qty 1

## 2016-02-29 MED ORDER — LACTATED RINGERS IV SOLN
INTRAVENOUS | Status: DC
  Administered 2016-02-29 (×2): via INTRAVENOUS

## 2016-02-29 MED ORDER — ONDANSETRON HCL 4 MG/2ML IJ SOLN
INTRAMUSCULAR | Status: AC
  Filled 2016-02-29: qty 2

## 2016-02-29 MED ORDER — TRAMADOL HCL 50 MG PO TABS: 50.0000 mg | ORAL_TABLET | Freq: Four times a day (QID) | ORAL | Status: DC | PRN

## 2016-02-29 MED ORDER — FLEET ENEMA 7-19 GM/118ML RE ENEM
1.0000 | ENEMA | Freq: Once | RECTAL | Status: DC
  Filled 2016-02-29: qty 1

## 2016-02-29 MED ORDER — ACETAMINOPHEN 500 MG PO TABS
ORAL_TABLET | ORAL | Status: AC
  Filled 2016-02-29: qty 2

## 2016-02-29 MED ORDER — FLUORESCEIN SODIUM 10 % IV SOLN
INTRAVENOUS | Status: DC | PRN
  Administered 2016-02-29: 50 mg via INTRAVENOUS

## 2016-02-29 MED ORDER — PRAVASTATIN SODIUM 40 MG PO TABS
40.0000 mg | ORAL_TABLET | Freq: Every day | ORAL | Status: DC
  Administered 2016-02-29 – 2016-03-01 (×2): 40 mg via ORAL
  Filled 2016-02-29 (×2): qty 1

## 2016-02-29 SURGICAL SUPPLY — 58 items
ALLOGRAFT TUTOPLAST AXIS 6X12 (Tissue) ×1 IMPLANT
BAG URINE DRAINAGE (UROLOGICAL SUPPLIES) ×3 IMPLANT
BLADE CLIPPER SURG (BLADE) ×3 IMPLANT
BLADE SURG 10 STRL SS (BLADE) ×3 IMPLANT
BLADE SURG 15 STRL LF DISP TIS (BLADE) ×1 IMPLANT
BLADE SURG 15 STRL SS (BLADE) ×2
BOOTIES KNEE HIGH SLOAN (MISCELLANEOUS) ×3 IMPLANT
CANISTER SUCTION 2500CC (MISCELLANEOUS) ×6 IMPLANT
CATH FOLEY 2WAY SLVR  5CC 16FR (CATHETERS) ×2
CATH FOLEY 2WAY SLVR 5CC 16FR (CATHETERS) ×1 IMPLANT
COVER BACK TABLE 60X90IN (DRAPES) ×3 IMPLANT
COVER MAYO STAND STRL (DRAPES) ×3 IMPLANT
DEVICE CAPIO SUTURING (INSTRUMENTS) ×2
DEVICE CAPIO SUTURING OPC (INSTRUMENTS) ×1 IMPLANT
DRAPE UNDERBUTTOCKS STRL (DRAPE) ×3 IMPLANT
GAUZE SPONGE 4X4 16PLY XRAY LF (GAUZE/BANDAGES/DRESSINGS) IMPLANT
GLOVE BIO SURGEON STRL SZ7.5 (GLOVE) ×6 IMPLANT
GOWN STRL REUS W/ TWL LRG LVL3 (GOWN DISPOSABLE) ×1 IMPLANT
GOWN STRL REUS W/ TWL XL LVL3 (GOWN DISPOSABLE) ×2 IMPLANT
GOWN STRL REUS W/TWL LRG LVL3 (GOWN DISPOSABLE) ×2
GOWN STRL REUS W/TWL XL LVL3 (GOWN DISPOSABLE) ×4
IV NS 500ML (IV SOLUTION) ×2
IV NS 500ML BAXH (IV SOLUTION) ×1 IMPLANT
KIT ROOM TURNOVER WOR (KITS) ×3 IMPLANT
LIQUID BAND (GAUZE/BANDAGES/DRESSINGS) IMPLANT
NEEDLE 1/2 CIR CATGUT .05X1.09 (NEEDLE) IMPLANT
NEEDLE HYPO 22GX1.5 SAFETY (NEEDLE) ×6 IMPLANT
PACKING VAGINAL (PACKING) ×3 IMPLANT
PENCIL BUTTON HOLSTER BLD 10FT (ELECTRODE) ×3 IMPLANT
PLUG CATH AND CAP STER (CATHETERS) ×3 IMPLANT
RETRACTOR LONRSTAR 16.6X16.6CM (MISCELLANEOUS) ×1 IMPLANT
RETRACTOR STAY HOOK 5MM (MISCELLANEOUS) ×3 IMPLANT
RETRACTOR STER APS 16.6X16.6CM (MISCELLANEOUS) ×3
SET IRRIG Y TYPE TUR BLADDER L (SET/KITS/TRAYS/PACK) ×3 IMPLANT
SHEET LAVH (DRAPES) ×3 IMPLANT
SPONGE LAP 4X18 X RAY DECT (DISPOSABLE) ×6 IMPLANT
SUCTION FRAZIER HANDLE 10FR (MISCELLANEOUS) ×2
SUCTION TUBE FRAZIER 10FR DISP (MISCELLANEOUS) ×1 IMPLANT
SUT ABS MONO DBL WITH NDL 48IN (SUTURE) IMPLANT
SUT CAPIO PGA 48IN SZ 0 (SUTURE) ×6 IMPLANT
SUT CAPIO POLYGLYCOLIC (SUTURE) IMPLANT
SUT ETHILON 2 0 PS N (SUTURE) IMPLANT
SUT MON AB 2-0 SH 27 (SUTURE) ×2
SUT MON AB 2-0 SH27 (SUTURE) ×1 IMPLANT
SUT MON AB 3-0 SH 27 (SUTURE) ×6
SUT MON AB 3-0 SH27 (SUTURE) ×3 IMPLANT
SUT VIC AB 2-0 UR6 27 (SUTURE) ×15 IMPLANT
SUT VIC AB 3-0 SH 27 (SUTURE) ×2
SUT VIC AB 3-0 SH 27X BRD (SUTURE) ×1 IMPLANT
SYR BULB IRRIGATION 50ML (SYRINGE) ×3 IMPLANT
SYR CONTROL 10ML LL (SYRINGE) ×6 IMPLANT
SYRINGE 10CC LL (SYRINGE) ×3 IMPLANT
TRAY DSU PREP LF (CUSTOM PROCEDURE TRAY) ×3 IMPLANT
TUBE CONNECTING 12'X1/4 (SUCTIONS) ×2
TUBE CONNECTING 12X1/4 (SUCTIONS) ×4 IMPLANT
TUTOPLAST AXIS 6X12 (Tissue) ×3 IMPLANT
WATER STERILE IRR 500ML POUR (IV SOLUTION) ×6 IMPLANT
YANKAUER SUCT BULB TIP NO VENT (SUCTIONS) IMPLANT

## 2016-02-29 NOTE — Anesthesia Preprocedure Evaluation (Addendum)
Anesthesia Evaluation  Patient identified by MRN, date of birth, ID band Patient awake    Reviewed: Allergy & Precautions, NPO status , Patient's Chart, lab work & pertinent test results  History of Anesthesia Complications (+) PONV and history of anesthetic complications  Airway Mallampati: II  TM Distance: >3 FB Neck ROM: Full    Dental no notable dental hx. (+) Dental Advisory Given   Pulmonary asthma , former smoker,    Pulmonary exam normal breath sounds clear to auscultation       Cardiovascular negative cardio ROS Normal cardiovascular exam Rhythm:Regular Rate:Normal     Neuro/Psych negative neurological ROS  negative psych ROS   GI/Hepatic Neg liver ROS, GERD  Controlled and Medicated,  Endo/Other  negative endocrine ROS  Renal/GU negative Renal ROS  negative genitourinary   Musculoskeletal  (+) Arthritis ,   Abdominal   Peds negative pediatric ROS (+)  Hematology negative hematology ROS (+)   Anesthesia Other Findings   Reproductive/Obstetrics negative OB ROS                            Anesthesia Physical Anesthesia Plan  ASA: II  Anesthesia Plan: General   Post-op Pain Management:    Induction: Intravenous  Airway Management Planned: LMA  Additional Equipment:   Intra-op Plan:   Post-operative Plan: Extubation in OR  Informed Consent: I have reviewed the patients History and Physical, chart, labs and discussed the procedure including the risks, benefits and alternatives for the proposed anesthesia with the patient or authorized representative who has indicated his/her understanding and acceptance.   Dental advisory given  Plan Discussed with: CRNA  Anesthesia Plan Comments:         Anesthesia Quick Evaluation

## 2016-02-29 NOTE — Anesthesia Procedure Notes (Signed)
Procedure Name: LMA Insertion Date/Time: 02/29/2016 9:09 AM Performed by: Bethena Roys T Pre-anesthesia Checklist: Patient identified, Emergency Drugs available, Suction available and Patient being monitored Patient Re-evaluated:Patient Re-evaluated prior to inductionOxygen Delivery Method: Circle system utilized Preoxygenation: Pre-oxygenation with 100% oxygen Intubation Type: IV induction Ventilation: Mask ventilation without difficulty LMA: LMA inserted LMA Size: 4.0 Number of attempts: 1 Airway Equipment and Method: Bite block Placement Confirmation: positive ETCO2 Tube secured with: Tape Dental Injury: Teeth and Oropharynx as per pre-operative assessment

## 2016-02-29 NOTE — Op Note (Signed)
Operative Note:  Pre-operative diagnosis: Pelvic floor prolapse with urinary incontinence, with anterior vault prolapse, posterior vault prolapse, apical prolapse.  Postoperative diagnosis: Pelvic floor prolapse with urinary incontinence, with anterior vault prolapse.  Operation: Anterior vault sacrospinous fixation using dermal graft repair, and    * AUGMENTED ANTERIOR VAGINAL PROLAPSE REPAIR WITH AXIS DERMIS AND SACROSPINOUS APICAL FIXATION AND KELLYPLICATION  - General  Surgeon:  Carolan Clines, MD  Resident: Virginia Crews, MD  Implants:   Implant Name Type Inv. Item Serial No. Manufacturer Lot No. LRB No. Used  TUTOPLAST AXIS [id number] Tissue TUTOPLAST AXIS 6X12 [id number] COLOPLAST [id number] N/A 1     Drains: 38 F foley catheter   Complications: None  Anesthesia:  General  Preparation: After appropriate preanesthesia, the patient was brought to the operating room, placed on the operating table in the dorsal supine position. She received 2g of Ancef. Timeout was observed.  Indications: Please see prior H&P for history  Procedure: Inspection of the vaginal vault reveals a grade 3 cystocele.   The [facility] retractor is placed, and a 48F foley catheter was placed. Using a blue marking pen, the bladder neck is identified, the mid urethra was also identified and marked. We identified our intended area of incision for the cystocele repair which in the midline was approximately 1 cm proximal to the urethrovaginal junction. This is marked with a semi-lunar horizontal line with a blue pen. 60 cc of lidocaine 0.5% with epinephrine 1-200,000 diluted in normal saline is then injected in the proposed incision site to serve as Hydro dissection to the level of the ischial spines, as well as for hemostasis.  Using a 15 blade, the horizontal semilunar incision was made. We sharply and bluntly dissected the overlying vaginal mucosa from  the underlying pubocervical fascia to the white line bilaterally.  This was dissected laterally to the level of the ischial spines, proximally to ~0.5 cm distal to the vaginal cuff and distally to the bladder neck. There was a small button hole in the vaginal wall which was closed with a 4-0 Monocryl running suture. With the bladder empty we next identified and cleared off the ischial spines bilaterally with finger dissection and also defined the sacrospinous ligaments. There was a moderate amount of scar from prior hysterectomy which we dissected through. Next, using the Cappio device, we placed an 0 Ethibond through the sacrospinous ligaments bilaterally. Sutures were placed one finger breath medial to the ischial spines bilaterally, and their location was palpated for accuracy.   Next, we performed a standard Kelly plication, using interrupted 2-0 Vicryl sutures, placed in horizontal mattress fashion with excellent reduction of the cystocele. We did not shorten the anterior vaginal wall or plicate the bladder neck. Next we pre-placed four separate 3-0 Monocryl sutures through the pubocervical fascia - one proximally in the midline, one distally in the midline and one on each side of the distal midline suture.   I then cut a 4 x 6 Coloplast Axis dermal graft.  The needle was then passed through the graft in each corresponding area and we then utilized a free needle to pass the second end of each suture through the graft as well so it would lie flat when we sutured it in place. We simultaneously tied down the sutures within the sacrospinous ligament which demonstrated excellent support. The remainder of the anchoring sutures were tied down with excellent cosmetic effect and the graft sat in a flat position and was tension free.  We then  closed the anterior vaginal wall in an inverted U shape with two running 3-0 Vicryls. She had excellent vaginal length.  I then cystoscoped the patient. There was no  distortion of the anatomy or injury to the bladder or urethra. She had excellent yellow jets bilaterally. Following cystoscopy, Foley catheter was replaced.    Minimal bleeding was noted.  We then closed the vaginal incision with running 3-0 Vicryl suture.  Vaginal packing was placed using estrogen and a vaginal packing, and Foley was left to straight drainage. The patient was awakened, and taken to recovery room in good condition.  Attestation: Dr. Gaynelle Arabian was present and scrubbed for the entirety of the procedure.

## 2016-02-29 NOTE — Interval H&P Note (Signed)
History and Physical Interval Note:  02/29/2016 9:01 AM  Rebecca Guzman  has presented today for surgery, with the diagnosis of ANTERIOR VAULT PROLAPSE  The various methods of treatment have been discussed with the patient and family. After consideration of risks, benefits and other options for treatment, the patient has consented to  Procedure(s) with comments: [place]   (N/A) - [place]    as a surgical intervention .  The patient's history has been reviewed, patient examined, no change in status, stable for surgery.  I have reviewed the patient's chart and labs.  Questions were answered to the patient's satisfaction.     Yemariam Magar I Tongela Encinas

## 2016-02-29 NOTE — Transfer of Care (Signed)
Immediate Anesthesia Transfer of Care Note  Patient: Rebecca Guzman  Procedure(s) Performed: Procedure(s): ANTERIOR VAULT PROLAPSE REPAIR COLOPLAST SACROSPINUS FIXATION AUGMENTED AXIS DERMIS REPAIR   (N/A)  Patient Location: PACU  Anesthesia Type:General  Level of Consciousness: sedated and responds to stimulation  Airway & Oxygen Therapy: Patient Spontanous Breathing and Patient connected to nasal cannula oxygen  Post-op Assessment: Report given to RN  Post vital signs: Reviewed and stable  Last Vitals: 113/59, 79, 14, 99%, 98.2 Vitals:   02/29/16 0719  BP: 136/75  Pulse: 87  Resp: 16  Temp: 36.9 C    Last Pain:  Vitals:   02/29/16 0719  TempSrc: Oral      Patients Stated Pain Goal: 7 ([id number])  Complications: No apparent anesthesia complications

## 2016-02-29 NOTE — Discharge Summary (Signed)
Date of admission: 02/29/2016  Date of discharge: 02/29/2016  Admission diagnosis: pelvic organ prolapse  Discharge diagnosis: pelvic organ prolapse  Secondary diagnoses: none  History and Physical: For full details, please see admission history and physical. Briefly, Rebecca Guzman is a 82 y.o. year old patient with pelvic organ prolapse.   Hospital Course: 82 yo female who is s/p anterior vaginal vault repair with Dr. Hartley Barefoot on 02/29/16. She did well post-operatively. Her diet was slowly advanced and at the time of discharge she was tolerating a regular diet, ambulating at her baseline, was voiding spontaneously after foley catheter removal, vaginal packing had been removed and pain was well controlled with oral narcotics. Unfortunately she failed her voiding trial with elevated PVR of 575ml after 143ml voided so foley was replaced. She was discharged to home on POD#1.  Laboratory values:   Recent Labs  02/29/16 0806 02/29/16 1221  HGB 13.3 12.6  HCT  --  37.4    Recent Labs  02/29/16 1221  CREATININE 0.75    Disposition: Home  Discharge instruction:  Discharge Instructions    Discharge instructions    Complete by:  As directed   It is normal to have some vaginal bleeding and a small amount of blood in your urine following this surgery. This will decrease over time.  Activity:  You are encouraged to ambulate frequently (about every hour during waking hours) to help prevent blood clots from forming in your legs or lungs.  However, you should not engage in any heavy lifting (> 10-15 lbs), strenuous activity, or straining.  Diet: You should advance your diet as instructed by your physician.  It will be normal to have some bloating, nausea, and abdominal discomfort intermittently.  Prescriptions:  You will be provided a prescription for pain medication to take as needed.  If your pain is not severe enough to require the prescription pain medication, you may take extra strength  Tylenol instead which will have less side effects.  You should also take a prescribed stool softener to avoid straining with bowel movements as the prescription pain medication may constipate you. Please complete the full 5 day course of antibiotics (ciprofloxacin).  Incisions:  You may start showering (but not soaking or bathing in water) the 2nd day after surgery and the incisions simply need to be patted dry after the shower.  No additional care is needed.  What to call us about: You should call the office [phone] if you develop fever > 101 or develop persistent vomiting.      Discharge medications:    Medication List    TAKE these medications   acetaminophen 500 MG tablet Commonly known as:  TYLENOL Take 1,000 mg by mouth every morning.   albuterol 108 (90 Base) MCG/ACT inhaler Commonly known as:  PROVENTIL HFA;VENTOLIN HFA Inhale 2 puffs into the lungs as needed for wheezing.   CALTRATE 600+D PO Take 2 tablets by mouth daily. Getting 2000 of vit D per day   cholecalciferol 1000 units tablet Commonly known as:  VITAMIN D Take 1,000 Units by mouth daily.   fexofenadine 180 MG tablet Commonly known as:  ALLEGRA Take 180 mg by mouth every morning.   fluticasone 50 MCG/ACT nasal spray Commonly known as:  FLONASE Place 1 spray into both nostrils every evening.   meloxicam 15 MG tablet Commonly known as:  MOBIC Take 1 tablet (15 mg total) by mouth daily.   multivitamin tablet Take 1 tablet by mouth daily.   omeprazole  40 MG capsule Commonly known as:  PRILOSEC Take 1 capsule (40 mg total) by mouth 2 (two) times daily.   pravastatin 40 MG tablet Commonly known as:  PRAVACHOL TAKE 1 TABLET BY MOUTH ONCE DAILY What changed:  See the new instructions.   PROBIOTIC FORMULA PO Take 1 tablet by mouth every evening.   ROLAIDS PO Take 2 tablets by mouth daily as needed (indigestion).       Followup:

## 2016-02-29 NOTE — Anesthesia Procedure Notes (Signed)
Performed by: Theia Dezeeuw T       

## 2016-02-29 NOTE — Anesthesia Postprocedure Evaluation (Signed)
Anesthesia Post Note  Patient: Rebecca Guzman  Procedure(s) Performed: Procedure(s) (LRB): ANTERIOR VAULT PROLAPSE REPAIR COLOPLAST SACROSPINUS FIXATION AUGMENTED AXIS DERMIS REPAIR   (N/A)  Patient location during evaluation: PACU Anesthesia Type: General Level of consciousness: awake and alert Pain management: pain level controlled Vital Signs Assessment: post-procedure vital signs reviewed and stable Respiratory status: spontaneous breathing, nonlabored ventilation, respiratory function stable and patient connected to nasal cannula oxygen Cardiovascular status: blood pressure returned to baseline and stable Postop Assessment: no signs of nausea or vomiting Anesthetic complications: no    Last Vitals:  Vitals:   02/29/16 1202 02/29/16 1215  BP: (!) 113/59 120/60  Pulse: 78 73  Resp: 10 17  Temp: 36.8 C     Last Pain:  Vitals:   02/29/16 0719  TempSrc: Oral                 [name]

## 2016-02-29 NOTE — Progress Notes (Signed)
Day of Surgery  Post Op Check  Subjective: The patient is doing well.  No nausea or vomiting. Pain is adequately controlled. Post-op labs stable Foley draining well without blood.  Objective: Vital signs in last 24 hours: Temp:  [98.2 F (36.8 C)-98.6 F (37 C)] 98.6 F (37 C) (07/24 1347) Pulse Rate:  [73-87] 82 (07/24 1347) Resp:  [10-17] 16 (07/24 1347) BP: (113-137)/(59-75) 137/72 (07/24 1347) SpO2:  [98 %] 98 % (07/24 1347) Weight:  [75.8 kg (167 lb)] 75.8 kg (167 lb) (07/24 0748)  Intake/Output from previous day: No intake/output data recorded. Intake/Output this shift: Total I/O In: 1200 [I.V.:1200] Out: 670 [Urine:650; Blood:20]  Physical Exam:  General: Alert and oriented. CV: RRR Lungs: normal work of breathing GI: Soft, Nondistended. Urine: Clear with fluoroscein discoloration Extremities: Nontender, no erythema, no edema.  Lab Results:  Recent Labs  02/29/16 0806 02/29/16 1221  HGB 13.3 12.6  HCT  --  37.4          Recent Labs  02/29/16 1221  CREATININE 0.75           Results for orders placed or performed during the hospital encounter of 02/29/16 (from the past 24 hour(s))  Hemoglobin-hemacue, POC     Status: None   Collection Time: 02/29/16  8:06 AM  Result Value Ref Range   Hemoglobin 13.3 12.0 - 15.0 g/dL  CBC     Status: Abnormal   Collection Time: 02/29/16 12:21 PM  Result Value Ref Range   WBC 13.5 (H) 4.0 - 10.5 K/uL   RBC 4.13 3.87 - 5.11 MIL/uL   Hemoglobin 12.6 12.0 - 15.0 g/dL   HCT 37.4 36.0 - 46.0 %   MCV 90.6 78.0 - 100.0 fL   MCH 30.5 26.0 - 34.0 pg   MCHC 33.7 30.0 - 36.0 g/dL   RDW 13.0 11.5 - 15.5 %   Platelets 212 150 - 400 K/uL  Basic metabolic panel     Status: Abnormal   Collection Time: 02/29/16 12:21 PM  Result Value Ref Range   Sodium 137 135 - 145 mmol/L   Potassium 3.9 3.5 - 5.1 mmol/L   Chloride 106 101 - 111 mmol/L   CO2 25 22 - 32 mmol/L   Glucose, Bld 131 (H) 65 - 99 mg/dL   BUN 10 6 - 20 mg/dL   Creatinine, Ser 0.75 0.44 - 1.00 mg/dL   Calcium 8.3 (L) 8.9 - 10.3 mg/dL   GFR calc non Af Amer >60 >60 mL/min   GFR calc Af Amer >60 >60 mL/min   Anion gap 6 5 - 15    Assessment/Plan: POD# 0 anterior vagianl vault repair.  1) Ambulate, Incentive spirometry 2) Advance diet as tolerated 3) Will use Tramadol if she needs any pain control beyond tylenol and mobic   LOS: 0 days   Rebecca Guzman 02/29/2016, 3:40 PM    Urology Attending Note: Pt ween and examined: I agree with Dr. Raynald Kemp A/P.

## 2016-03-01 ENCOUNTER — Encounter (HOSPITAL_BASED_OUTPATIENT_CLINIC_OR_DEPARTMENT_OTHER): Payer: Self-pay | Admitting: Urology

## 2016-03-01 DIAGNOSIS — N8189 Other female genital prolapse: Secondary | ICD-10-CM | POA: Diagnosis not present

## 2016-03-01 LAB — BASIC METABOLIC PANEL
ANION GAP: 6 (ref 5–15)
BUN: 13 mg/dL (ref 6–20)
CALCIUM: 8.4 mg/dL — AB (ref 8.9–10.3)
CO2: 24 mmol/L (ref 22–32)
Chloride: 108 mmol/L (ref 101–111)
Creatinine, Ser: 0.68 mg/dL (ref 0.44–1.00)
Glucose, Bld: 129 mg/dL — ABNORMAL HIGH (ref 65–99)
Potassium: 3.8 mmol/L (ref 3.5–5.1)
SODIUM: 138 mmol/L (ref 135–145)

## 2016-03-01 LAB — CBC
HEMATOCRIT: 34.5 % — AB (ref 36.0–46.0)
Hemoglobin: 11.8 g/dL — ABNORMAL LOW (ref 12.0–15.0)
MCH: 30.9 pg (ref 26.0–34.0)
MCHC: 34.2 g/dL (ref 30.0–36.0)
MCV: 90.3 fL (ref 78.0–100.0)
PLATELETS: 212 10*3/uL (ref 150–400)
RBC: 3.82 MIL/uL — ABNORMAL LOW (ref 3.87–5.11)
RDW: 13.1 % (ref 11.5–15.5)
WBC: 13.6 10*3/uL — AB (ref 4.0–10.5)

## 2016-03-01 MED ORDER — MELOXICAM 15 MG PO TABS
15.0000 mg | ORAL_TABLET | Freq: Every day | ORAL | Status: DC
  Administered 2016-03-01: 15 mg via ORAL
  Filled 2016-03-01: qty 1

## 2016-03-01 NOTE — Progress Notes (Signed)
Patient given discharge, medication, follow up, and catheter care instructions, verbalized understanding, IV removed, family to transport home

## 2016-03-01 NOTE — Progress Notes (Signed)
1 Day Post-Op   Subjective: The patient is doing well.  No nausea or vomiting. Pain is adequately controlled. Foley draining well. Hb stable at 11.8 from 12.6. Ambulated multiple times.  Objective: Vital signs in last 24 hours: Temp:  [97.5 F (36.4 C)-98.6 F (37 C)] 98 F (36.7 C) (07/25 0644) Pulse Rate:  [62-87] 81 (07/25 0644) Resp:  [10-17] 16 (07/25 0644) BP: (112-137)/(48-75) 123/65 (07/25 0644) SpO2:  [93 %-98 %] 96 % (07/25 0644) Weight:  [75.8 kg (167 lb)] 75.8 kg (167 lb) (07/24 0748)  Intake/Output from previous day: 07/24 0701 - 07/25 0700 In: 1737.5 [P.O.:240; I.V.:1497.5] Out: 1570 [Urine:1550; Blood:20] Intake/Output this shift: Total I/O In: -  Out: 400 [Urine:400]  Physical Exam:  General: Alert and oriented. CV: RRR Lungs: normal work of breathing GI: Soft, Nondistended. Extremities: Nontender, no erythema, no edema.  Lab Results:  Recent Labs  02/29/16 0806 02/29/16 1221 03/01/16 0335  HGB 13.3 12.6 11.8*  HCT  --  37.4 34.5*           Recent Labs  02/29/16 1221 03/01/16 0335  CREATININE 0.75 0.68           Results for orders placed or performed during the hospital encounter of 02/29/16 (from the past 24 hour(s))  Hemoglobin-hemacue, POC     Status: None   Collection Time: 02/29/16  8:06 AM  Result Value Ref Range   Hemoglobin 13.3 12.0 - 15.0 g/dL  CBC     Status: Abnormal   Collection Time: 02/29/16 12:21 PM  Result Value Ref Range   WBC 13.5 (H) 4.0 - 10.5 K/uL   RBC 4.13 3.87 - 5.11 MIL/uL   Hemoglobin 12.6 12.0 - 15.0 g/dL   HCT 37.4 36.0 - 46.0 %   MCV 90.6 78.0 - 100.0 fL   MCH 30.5 26.0 - 34.0 pg   MCHC 33.7 30.0 - 36.0 g/dL   RDW 13.0 11.5 - 15.5 %   Platelets 212 150 - 400 K/uL  Basic metabolic panel     Status: Abnormal   Collection Time: 02/29/16 12:21 PM  Result Value Ref Range   Sodium 137 135 - 145 mmol/L   Potassium 3.9 3.5 - 5.1 mmol/L   Chloride 106 101 - 111 mmol/L   CO2 25 22 - 32 mmol/L   Glucose,  Bld 131 (H) 65 - 99 mg/dL   BUN 10 6 - 20 mg/dL   Creatinine, Ser 0.75 0.44 - 1.00 mg/dL   Calcium 8.3 (L) 8.9 - 10.3 mg/dL   GFR calc non Af Amer >60 >60 mL/min   GFR calc Af Amer >60 >60 mL/min   Anion gap 6 5 - 15  CBC     Status: Abnormal   Collection Time: 03/01/16  3:35 AM  Result Value Ref Range   WBC 13.6 (H) 4.0 - 10.5 K/uL   RBC 3.82 (L) 3.87 - 5.11 MIL/uL   Hemoglobin 11.8 (L) 12.0 - 15.0 g/dL   HCT 34.5 (L) 36.0 - 46.0 %   MCV 90.3 78.0 - 100.0 fL   MCH 30.9 26.0 - 34.0 pg   MCHC 34.2 30.0 - 36.0 g/dL   RDW 13.1 11.5 - 15.5 %   Platelets 212 150 - 400 K/uL  Basic metabolic panel     Status: Abnormal   Collection Time: 03/01/16  3:35 AM  Result Value Ref Range   Sodium 138 135 - 145 mmol/L   Potassium 3.8 3.5 - 5.1 mmol/L   Chloride  108 101 - 111 mmol/L   CO2 24 22 - 32 mmol/L   Glucose, Bld 129 (H) 65 - 99 mg/dL   BUN 13 6 - 20 mg/dL   Creatinine, Ser 0.68 0.44 - 1.00 mg/dL   Calcium 8.4 (L) 8.9 - 10.3 mg/dL   GFR calc non Af Amer >60 >60 mL/min   GFR calc Af Amer >60 >60 mL/min   Anion gap 6 5 - 15    Assessment/Plan: POD# 1 anterior vaginal vault repair.  1) Ambulate, Incentive spirometry 2) Advance diet as tolerated 3) remove vaginal packing 4) d/c foley for trial of void 5) discharge home later today   LOS: 0 days   Lolita Rieger 03/01/2016, 6:49 AM     Urology Attending Note: Pt seen and examined. Agree with Dr. . Kallie Edward A/P.

## 2016-03-01 NOTE — Progress Notes (Signed)
The vaginal packing and the foley catheter were removed. Patient tolerated these procedures well.

## 2016-04-04 ENCOUNTER — Ambulatory Visit
Admission: RE | Admit: 2016-04-04 | Discharge: 2016-04-04 | Disposition: A | Discharge status: Home / Self Care | Payer: Medicare Other | Source: Ambulatory Visit | Attending: Internal Medicine | Admitting: Internal Medicine

## 2016-04-04 DIAGNOSIS — Z1231 Encounter for screening mammogram for malignant neoplasm of breast: Secondary | ICD-10-CM

## 2016-04-12 ENCOUNTER — Ambulatory Visit
Admission: RE | Admit: 2016-04-12 | Discharge: 2016-04-12 | Disposition: A | Discharge status: Home / Self Care | Payer: Medicare Other | Source: Ambulatory Visit | Attending: Internal Medicine | Admitting: Internal Medicine

## 2016-04-12 DIAGNOSIS — M810 Age-related osteoporosis without current pathological fracture: Secondary | ICD-10-CM

## 2016-04-13 ENCOUNTER — Telehealth: Payer: Self-pay | Admitting: Internal Medicine

## 2016-04-13 NOTE — Telephone Encounter (Signed)
Called and spoke with patient to give her the update on the insurance verification we ran for her Prolia injection. She stated she was not going to get her Prolia Injection at this time because her Orthopedist advised her she should wait 18 months between injections. She stated she had already advised Dr. Mariea Clonts of this, and she was sorry I went through that trouble for no reason.  Patient's last injection was 10/23/15 and will be due again 04/25/17 per Orthopedists advice.

## 2016-05-30 ENCOUNTER — Encounter: Payer: Self-pay | Admitting: Internal Medicine

## 2016-05-30 ENCOUNTER — Ambulatory Visit (INDEPENDENT_AMBULATORY_CARE_PROVIDER_SITE_OTHER): Payer: Medicare Other | Admitting: Internal Medicine

## 2016-05-30 VITALS — BP 120/70 | HR 79 | Temp 98.3°F

## 2016-05-30 DIAGNOSIS — R739 Hyperglycemia, unspecified: Secondary | ICD-10-CM | POA: Diagnosis not present

## 2016-05-30 DIAGNOSIS — J309 Allergic rhinitis, unspecified: Secondary | ICD-10-CM | POA: Diagnosis not present

## 2016-05-30 DIAGNOSIS — N8110 Cystocele, unspecified: Secondary | ICD-10-CM

## 2016-05-30 DIAGNOSIS — M5416 Radiculopathy, lumbar region: Secondary | ICD-10-CM

## 2016-05-30 DIAGNOSIS — M810 Age-related osteoporosis without current pathological fracture: Secondary | ICD-10-CM

## 2016-05-30 DIAGNOSIS — D62 Acute posthemorrhagic anemia: Secondary | ICD-10-CM | POA: Diagnosis not present

## 2016-05-30 DIAGNOSIS — IMO0001 Reserved for inherently not codable concepts without codable children: Secondary | ICD-10-CM

## 2016-05-30 LAB — COMPLETE METABOLIC PANEL WITH GFR
ALT: 14 U/L (ref 6–29)
AST: 20 U/L (ref 10–35)
Albumin: 4 g/dL (ref 3.6–5.1)
Alkaline Phosphatase: 54 U/L (ref 33–130)
BUN: 14 mg/dL (ref 7–25)
CO2: 26 mmol/L (ref 20–31)
Calcium: 9.2 mg/dL (ref 8.6–10.4)
Chloride: 107 mmol/L (ref 98–110)
Creat: 0.82 mg/dL (ref 0.60–0.88)
GFR, Est African American: 78 mL/min (ref >=60)
GFR, Est Non African American: 68 mL/min (ref >=60)
Glucose, Bld: 96 mg/dL (ref 65–99)
Potassium: 4.2 mmol/L (ref 3.5–5.3)
Sodium: 140 mmol/L (ref 135–146)
Total Bilirubin: 0.5 mg/dL (ref 0.2–1.2)
Total Protein: 6.5 g/dL (ref 6.1–8.1)

## 2016-05-30 LAB — CBC WITH DIFFERENTIAL/PLATELET
Basophils Absolute: 0 cells/uL (ref 0–200)
Basophils Relative: 0 %
Eosinophils Absolute: 434 cells/uL (ref 15–500)
Eosinophils Relative: 7 %
HCT: 39.7 % (ref 35.0–45.0)
Hemoglobin: 13.1 g/dL (ref 11.7–15.5)
Lymphocytes Relative: 31 %
Lymphs Abs: 1922 cells/uL (ref 850–3900)
MCH: 29.6 pg (ref 27.0–33.0)
MCHC: 33 g/dL (ref 32.0–36.0)
MCV: 89.8 fL (ref 80.0–100.0)
MPV: 11.3 fL (ref 7.5–12.5)
Monocytes Absolute: 434 cells/uL (ref 200–950)
Monocytes Relative: 7 %
Neutro Abs: 3410 cells/uL (ref 1500–7800)
Neutrophils Relative %: 55 %
Platelets: 230 10*3/uL (ref 140–400)
RBC: 4.42 MIL/uL (ref 3.80–5.10)
RDW: 13.6 % (ref 11.0–15.0)
WBC: 6.2 10*3/uL (ref 3.8–10.8)

## 2016-05-30 NOTE — Progress Notes (Signed)
Location:  [facility] clinic Provider:  Sincere Liuzzi [name], D.O., C.M.D.  Code Status: DNR Goals of Care:  Advanced Directives 05/30/2016  Does patient have an advance directive? Yes  Type of Advance Directive Out of facility DNR (pink MOST or yellow form)  Does patient want to make changes to advanced directive? -  Copy of advanced directive(s) in chart? Yes  Would patient like information on creating an advanced directive? -  Pre-existing out of facility DNR order (yellow form or pink MOST form) Yellow form placed in chart (order not valid for inpatient use)     Chief Complaint  Patient presents with  . Medical Management of Chronic Issues    4 mth follow-up    HPI: Patient is a 82 y.o. Rebecca Guzman seen today for medical management of chronic diseases.    Bladder tie-up surgery went very well.  Had not anestheisa problems this time either at end of July.  She is doing some physical therapy for some leakage she did not have before.  Has third session Friday.  [facility].  Doesn't help her disc in her back any though.  Does that 2x per week and walks 20-25 mins each am otherwise.  Back is doing much better since her epidural injection.  Still a problem after her water walking or if she has to bend over or pick something out of a lower cabinet.  Location of disc disease is poor due to scoliosis.  Bed with vibration and heat helps her.    Had her mammogram and bone density done.  Hip bone density has not declined since last bone density.  Does have osteoporosis in wrist also.   Past Medical History:  Diagnosis Date  . Asthma   . BPPV (benign paroxysmal positional vertigo)   . GERD (gastroesophageal reflux disease)   . Hiatal hernia   . History of adenomatous polyp of colon    tubular adenoma 2005 and  2014 tubular adenoma and  hyperplastic polyp  . History of breast cancer per pt no recurrence   dx 1992 --  s/p  left breast mastectomy  and chemotherapy  . Hyperlipidemia LDL goal <100   . Osteoarthritis of right knee   . PONV (postoperative nausea and vomiting)    and "gets weak and light-leaded"  . Sensation of pressure in bladder area   . Vaginal vault prolapse    anterior  . Wears glasses   . Wears hearing aid    left only    Past Surgical History:  Procedure Laterality Date  . CATARACT EXTRACTION W/ INTRAOCULAR LENS  IMPLANT, BILATERAL  2014  . CHOLECYSTECTOMY  1985  . COLONOSCOPY  last one 06-04-2013  . CYSTOCELE REPAIR N/A 02/29/2016   Procedure: ANTERIOR VAULT PROLAPSE REPAIR COLOPLAST [place] FIXATION AUGMENTED AXIS DERMIS REPAIR  ;  Surgeon: Carolan Clines, MD;  Location: [place];  Service: Urology;  Laterality: N/A;  . ESOPHAGOGASTRODUODENOSCOPY  08-31-2009  . LAPAROSCOPIC ASSISTED VAGINAL HYSTERECTOMY  01-10-2000   w/ Bilateral Salpingoophorectomy /  Anterior & Posterior repair/  Pubovaginal Sling  . MASTECTOMY Left 1992    Allergies  Allergen Reactions  . Codeine Nausea And Vomiting  . Morphine And Related Other (See Comments)    Nausea, and drop in blood pressure      Medication List       Accurate as of 05/30/16 10:34 AM. Always use your most recent med list.  acetaminophen 500 MG tablet Commonly known as:  TYLENOL Take 1,000 mg by mouth every morning.   albuterol 108 (90 Base) MCG/ACT inhaler Commonly known as:  PROVENTIL HFA;VENTOLIN HFA Inhale 2 puffs into the lungs as needed for wheezing.   CALTRATE 600+D PO Take 2 tablets by mouth daily. Getting 2000 of vit D per day   cholecalciferol 1000 units tablet Commonly known as:  VITAMIN D Take 1,000 Units by mouth daily.   fexofenadine 180 MG tablet Commonly known as:  ALLEGRA Take 180 mg by mouth every morning.   fluticasone 50 MCG/ACT nasal spray Commonly known as:  FLONASE Place 1 spray into both nostrils every evening.   multivitamin tablet Take 1 tablet by mouth daily.   omeprazole  40 MG capsule Commonly known as:  PRILOSEC Take 1 capsule (40 mg total) by mouth 2 (two) times daily.   pravastatin 40 MG tablet Commonly known as:  PRAVACHOL Take 40 mg by mouth daily.   PROBIOTIC FORMULA PO Take 1 tablet by mouth every evening.   ROLAIDS PO Take 2 tablets by mouth daily as needed (indigestion).       Review of Systems:  Review of Systems  Constitutional: Negative for fever.  HENT: Negative for congestion and hearing loss.   Eyes: Negative for blurred vision.  Respiratory: Negative for cough and shortness of breath.   Cardiovascular: Negative for chest pain and palpitations.  Gastrointestinal: Negative for abdominal pain, blood in stool, constipation and melena.  Genitourinary: Negative for dysuria, frequency, hematuria and urgency.       Leakage  Musculoskeletal: Positive for back pain. Negative for falls and myalgias.       Improved after injection  Skin: Negative for itching and rash.  Neurological: Negative for dizziness and loss of consciousness.  Endo/Heme/Allergies: Does not bruise/bleed easily.  Psychiatric/Behavioral: Negative for depression and memory loss.    Health Maintenance  Topic Date Due  . INFLUENZA VACCINE  03/08/2016  . TETANUS/TDAP  01/25/2025  . DEXA SCAN  Completed  . ZOSTAVAX  Completed  . PNA vac Low Risk Adult  Completed    Physical Exam: Vitals:   05/30/16 0956  BP: 120/70  Pulse: 79  Temp: 98.3 F (36.8 C)  TempSrc: Oral  SpO2: 96%   There is no height or weight on file to calculate BMI. Physical Exam  Constitutional: She is oriented to person, place, and time. She appears well-developed and well-nourished. No distress.  Cardiovascular: Normal rate, regular rhythm, normal heart sounds and intact distal pulses.   Pulmonary/Chest: Effort normal and breath sounds normal. No respiratory distress.  Abdominal: Soft. Bowel sounds are normal.  Musculoskeletal: Normal range of motion.  Neurological: She is alert and  oriented to person, place, and time.  Skin: Skin is warm and dry.    Labs reviewed: Basic Metabolic Panel:  Recent Labs  01/26/16 0802 02/29/16 1221 03/01/16 0335  NA 143 137 138  K 4.4 3.9 3.8  CL 104 106 108  CO2 23 25 24   GLUCOSE 92 131* 129*  BUN 15 10 13   CREATININE 0.82 0.75 0.68  CALCIUM 9.2 8.3* 8.4*   Liver Function Tests:  Recent Labs  01/26/16 0802  AST 16  ALT 12  ALKPHOS 63  BILITOT 0.3  PROT 6.7  ALBUMIN 4.1   No results for input(s): LIPASE, AMYLASE in the last 8760 hours. No results for input(s): AMMONIA in the last 8760 hours. CBC:  Recent Labs  01/26/16 0802 02/29/16 0806 02/29/16 1221 03/01/16  0335  WBC 6.6  --  13.5* 13.6*  NEUTROABS 3.5  --   --   --   HGB  --  13.3 12.6 11.8*  HCT 38.4  --  37.4 34.5*  MCV 90  --  90.6 90.3  PLT 225  --  212 212   Lipid Panel:  Recent Labs  01/26/16 0802  CHOL 187  HDL 59  LDLCALC 102*  TRIG 129  CHOLHDL 3.2   Lab Results  Component Value Date   HGBA1C 5.6 01/26/2016    Assessment/Plan 1. Senile osteoporosis - cont ca with D and additional D and prolia--after discussion she agreed to continue these at the regular intervals instead of waiting between b/c her bone density only showed stability and not any significant improvement in bone density - COMPLETE METABOLIC PANEL WITH GFR  2. Bladder prolapse, Rebecca Guzman, acquired -s/p surgical sling by Dr. Gaynelle Arabian -great improvement, but does have some leakage and there is some concern she may be developing a rectal prolapse now -cont exercises, water walking and regular walking   3. Allergic sinusitis -cont flonase and allegra for this which is bothersome during certain seasons like now  4. Radicular pain of right lower back -cont tylenol for pain, exercise  5. Hyperglycemia - cont diet and exercise - Hemoglobin A1c - COMPLETE METABOLIC PANEL WITH GFR  6. Postoperative anemia due to acute blood loss - f/u labs to check on  improvement - CBC with Differential/Platelet  Labs/tests ordered:   Orders Placed This Encounter  Procedures  . Hemoglobin A1c  . COMPLETE METABOLIC PANEL WITH GFR    [facility]  . CBC with Differential/Platelet   Next appt:  4 mos for med mgt and prolia injection as soon as convenient for her   Paden Kuras L. Saray Capasso, D.O. [facility] [address], [place] [address] Cell Phone (Mon-Fri 8am-5pm):  [phone] On Call:  [phone] & follow prompts after 5pm & weekends Office Phone:  [phone] Office Fax:  [phone]

## 2016-05-31 LAB — HEMOGLOBIN A1C
Hgb A1c MFr Bld: 5.4 % (ref <5.7)
Mean Plasma Glucose: 108 mg/dL

## 2016-06-02 ENCOUNTER — Encounter: Payer: Self-pay | Admitting: *Deleted

## 2016-06-14 ENCOUNTER — Ambulatory Visit: Payer: Medicare Other

## 2016-06-16 ENCOUNTER — Ambulatory Visit (INDEPENDENT_AMBULATORY_CARE_PROVIDER_SITE_OTHER): Payer: Medicare Other

## 2016-06-16 DIAGNOSIS — M810 Age-related osteoporosis without current pathological fracture: Secondary | ICD-10-CM

## 2016-06-16 MED ORDER — DENOSUMAB 60 MG/ML ~~LOC~~ SOLN
60.0000 mg | Freq: Once | SUBCUTANEOUS | Status: AC
  Administered 2016-06-16: 60 mg via SUBCUTANEOUS

## 2016-08-24 ENCOUNTER — Telehealth: Payer: Self-pay | Admitting: Internal Medicine

## 2016-08-24 NOTE — Telephone Encounter (Signed)
left msg asking pt to confirm this AWV w/ nurse and CPE w/ Dr. Reed. VDM (DD) °

## 2016-09-09 ENCOUNTER — Telehealth (INDEPENDENT_AMBULATORY_CARE_PROVIDER_SITE_OTHER): Payer: Self-pay | Admitting: *Deleted

## 2016-09-09 DIAGNOSIS — G8929 Other chronic pain: Secondary | ICD-10-CM

## 2016-09-09 DIAGNOSIS — M545 Low back pain: Principal | ICD-10-CM

## 2016-09-09 NOTE — Telephone Encounter (Signed)
Yes. Ok    thanks

## 2016-09-09 NOTE — Telephone Encounter (Signed)
Patient called in this morning in regards to wanting to know if she could have another epidural injection in her back? Her CB # [phone]. Thank you

## 2016-09-09 NOTE — Telephone Encounter (Signed)
OK to enter order?  Patient had right L3 transforaminal injection 11/03/15. Per last office note 02/23/16, we could reorder second if she has recurrance of pain in the year.

## 2016-09-09 NOTE — Telephone Encounter (Signed)
Order entered. Patient advised that someone from Dr. Romona Curls scheduling area will call her.

## 2016-09-09 NOTE — Addendum Note (Signed)
Addended by: Meyer Cory on: 09/09/2016 02:36 PM   Modules accepted: Orders

## 2016-09-14 ENCOUNTER — Encounter (INDEPENDENT_AMBULATORY_CARE_PROVIDER_SITE_OTHER): Payer: Self-pay | Admitting: Physical Medicine and Rehabilitation

## 2016-09-14 ENCOUNTER — Ambulatory Visit (INDEPENDENT_AMBULATORY_CARE_PROVIDER_SITE_OTHER): Payer: Self-pay

## 2016-09-14 ENCOUNTER — Ambulatory Visit (INDEPENDENT_AMBULATORY_CARE_PROVIDER_SITE_OTHER): Payer: Medicare Other | Admitting: Physical Medicine and Rehabilitation

## 2016-09-14 VITALS — BP 125/74 | HR 80 | Temp 98.4°F

## 2016-09-14 DIAGNOSIS — M5416 Radiculopathy, lumbar region: Secondary | ICD-10-CM | POA: Diagnosis not present

## 2016-09-14 MED ORDER — METHYLPREDNISOLONE ACETATE 80 MG/ML IJ SUSP
80.0000 mg | Freq: Once | INTRAMUSCULAR | Status: AC
  Administered 2016-09-14: 80 mg

## 2016-09-14 MED ORDER — LIDOCAINE HCL (PF) 1 % IJ SOLN
0.3300 mL | Freq: Once | INTRAMUSCULAR | Status: AC
  Administered 2016-09-14: 0.3 mL

## 2016-09-14 NOTE — Progress Notes (Signed)
Rebecca Guzman - 83 y.o. female MRN [id number]  Date of birth: 02/16/1934  Office Visit Note: Visit Date: 09/14/2016 PCP: [name], DO Referred by: [name], DO  Subjective: Chief Complaint  Patient presents with  . Lower Back - Pain   HPI: Rebecca Guzman is very pleasant 83 year old female that we saw through Dr. Lorin Mercy last year and completed a right L3 transforaminal injection with good relief. She now reports increased right side lower back pain for a couple of weeks. Radiating down right leg to knee. Denies numbness or tingling. Pain worse bending and increased activity such as household chores.States she did really well with last injection. Not pain free but a lot better. She has a complicated spine with scoliosis and foraminal narrowing severe right L3 as well as lateral recess narrowing at this level and then moderate multifactorial central canal lateral recess narrowing at L4-5.    ROS Otherwise per HPI.  Assessment & Plan: Visit Diagnoses:  1. Lumbar radiculopathy     Plan: Findings:  Repeat right L3 transforaminal injection because good relief last year for several months.    Meds & Orders:  Meds ordered this encounter  Medications  . lidocaine (PF) (XYLOCAINE) 1 % injection 0.3 mL  . methylPREDNISolone acetate (DEPO-MEDROL) injection 80 mg    Orders Placed This Encounter  Procedures  . XR C-ARM NO REPORT  . Epidural Steroid injection    Follow-up: Return if symptoms worsen or fail to improve, 2 weeks.   Procedures: No procedures performed  Lumbosacral Transforaminal Epidural Steroid Injection - Infraneural Approach with Fluoroscopic Guidance  Patient: Rebecca Guzman      Date of Birth: [birth year] MRN: [id number] PCP: [name], DO      Visit Date: 09/14/2016   Universal Protocol:    Date/Time: 02/07/182:43 PM  Consent Given By: the patient  Position: PRONE   Additional Comments: Vital signs were monitored before and after the  procedure. Patient was prepped and draped in the usual sterile fashion. The correct patient, procedure, and site was verified.   Injection Procedure Details:  Procedure Site One Meds Administered:  Meds ordered this encounter  Medications  . lidocaine (PF) (XYLOCAINE) 1 % injection 0.3 mL  . methylPREDNISolone acetate (DEPO-MEDROL) injection 80 mg      Laterality: Right  Location/Site:  L3-L4  Needle size: 22 G  Needle type: Spinal  Needle Placement: Transforaminal  Findings:  -Contrast Used: 2 mL iohexol 180 mg iodine/mL   -Comments: Excellent flow of contrast along the nerve and into the epidural space.  Procedure Details: After squaring off the end-plates of the desired vertebral level to get a true AP view, the C-arm was obliqued to the painful side so that the superior articulating process is positioned about 1/3 the length of the inferior endplate.  The needle was aimed toward the junction of the superior articular process and the transverse process of the inferior vertebrae. The needle's initial entry is in the lower third of the foramen through Kambin's triangle. The soft tissues overlying this target were infiltrated with 2-3 ml. of 1% Lidocaine without Epinephrine.  The spinal needle was then inserted and advanced toward the target using a "trajectory" view along the fluoroscope beam.  Under AP and lateral visualization, the needle was advanced so it did not puncture dura and did not traverse medially beyond the 6 o'clock position of the pedicle. Bi-planar projections were used to confirm position. Aspiration was confirmed to be negative for CSF and/or  blood. A 1-2 ml. volume of Isovue-250 was injected and flow of contrast was noted at each level. Radiographs were obtained for documentation purposes.   After attaining the desired flow of contrast documented above, a 0.5 to 1.0 ml test dose of 0.25% Marcaine was injected into each respective transforaminal space.  The  patient was observed for 90 seconds post injection.  After no sensory deficits were reported, and normal lower extremity motor function was noted,   the above injectate was administered so that equal amounts of the injectate were placed at each foramen (level) into the transforaminal epidural space.   Additional Comments:  The patient tolerated the procedure well No complications occurred Dressing: Band-Aid    Post-procedure details: Patient was observed during the procedure. Post-procedure instructions were reviewed.  Patient left the clinic in stable condition.   Clinical History: No specialty comments available.  She reports that she quit smoking about 32 years ago. Her smoking use included Cigarettes. She quit after 13.00 years of use. She has never used smokeless tobacco.   Recent Labs  01/26/16 0802 05/30/16 1105  HGBA1C 5.6 5.4    Objective:  VS:  HT:    WT:   BMI:     BP:125/74  HR:80bpm  TEMP:98.4 F (36.9 C)( )  RESP:99 % Physical Exam  Musculoskeletal:  Examination of the lumbar spine shows leftward scoliosis with pain with extension rotation of the lumbar spine with no pain over the greater trochanters.    Ortho Exam Imaging: Xr C-arm No Report  Result Date: 09/14/2016 Please see Notes or Procedures tab for imaging impression.   Past Medical/Family/Surgical/Social History: Medications & Allergies reviewed per EMR Patient Active Problem List   Diagnosis Date Noted  . Prolapse of female pelvic organs 02/29/2016  . Radicular pain of right lower back 04/06/2015  . Compression fracture of L3 lumbar vertebra ([place]) 04/06/2015  . Senile osteoporosis 04/06/2015  . DDD (degenerative disc disease), lumbar 04/06/2015  . Facet arthropathy, lumbar 04/06/2015  . Other malaise and fatigue 01/17/2014  . Osteopenia 01/17/2014  . Hx of adenomatous colonic polyps 07/11/2013  . Osteoarthritis of right knee   . Hyperlipidemia LDL goal <100   . GERD (gastroesophageal  reflux disease)   . Bladder prolapse, female, acquired   . Allergic sinusitis   . Esophageal reflux 10/12/2011   Past Medical History:  Diagnosis Date  . Asthma   . BPPV (benign paroxysmal positional vertigo)   . GERD (gastroesophageal reflux disease)   . Hiatal hernia   . History of adenomatous polyp of colon    tubular adenoma 2005 and  2014 tubular adenoma and  hyperplastic polyp  . History of breast cancer per pt no recurrence   dx 1992 --  s/p  left breast mastectomy and chemotherapy  . Hyperlipidemia LDL goal <100   . Osteoarthritis of right knee   . PONV (postoperative nausea and vomiting)    and "gets weak and light-leaded"  . Sensation of pressure in bladder area   . Vaginal vault prolapse    anterior  . Wears glasses   . Wears hearing aid    left only   Family History  Problem Relation Age of Onset  . Heart attack Mother   . Colon cancer Mother   . Hypertension Mother   . Heart disease Father   . Breast cancer Sister    Past Surgical History:  Procedure Laterality Date  . CATARACT EXTRACTION W/ INTRAOCULAR LENS  IMPLANT, BILATERAL  2014  .  CHOLECYSTECTOMY  1985  . COLONOSCOPY  last one 06-04-2013  . CYSTOCELE REPAIR N/A 02/29/2016   Procedure: ANTERIOR VAULT PROLAPSE REPAIR COLOPLAST [place] FIXATION AUGMENTED AXIS DERMIS REPAIR  ;  Surgeon: Carolan Clines, MD;  Location: [facility];  Service: Urology;  Laterality: N/A;  . ESOPHAGOGASTRODUODENOSCOPY  08-31-2009  . LAPAROSCOPIC ASSISTED VAGINAL HYSTERECTOMY  01-10-2000   w/ Bilateral Salpingoophorectomy /  Anterior & Posterior repair/  Pubovaginal Sling  . MASTECTOMY Left 1992   Social History   Occupational History  . Retired Pharmacist, hospital Retired   Social History Main Topics  . Smoking status: Former Smoker    Years: 13.00    Types: Cigarettes    Quit date: 10/12/1983  . Smokeless tobacco: Never Used  . Alcohol use No  . Drug use: No  . Sexual activity: Not on file

## 2016-09-14 NOTE — Procedures (Signed)
Lumbosacral Transforaminal Epidural Steroid Injection - Infraneural Approach with Fluoroscopic Guidance  Patient: Rebecca Guzman      Date of Birth: 09/09/1933 MRN: [id number] PCP: [name], DO      Visit Date: 09/14/2016   Universal Protocol:    Date/Time: 02/07/182:43 PM  Consent Given By: the patient  Position: PRONE   Additional Comments: Vital signs were monitored before and after the procedure. Patient was prepped and draped in the usual sterile fashion. The correct patient, procedure, and site was verified.   Injection Procedure Details:  Procedure Site One Meds Administered:  Meds ordered this encounter  Medications  . lidocaine (PF) (XYLOCAINE) 1 % injection 0.3 mL  . methylPREDNISolone acetate (DEPO-MEDROL) injection 80 mg      Laterality: Right  Location/Site:  L3-L4  Needle size: 22 G  Needle type: Spinal  Needle Placement: Transforaminal  Findings:  -Contrast Used: 2 mL iohexol 180 mg iodine/mL   -Comments: Excellent flow of contrast along the nerve and into the epidural space.  Procedure Details: After squaring off the end-plates of the desired vertebral level to get a true AP view, the C-arm was obliqued to the painful side so that the superior articulating process is positioned about 1/3 the length of the inferior endplate.  The needle was aimed toward the junction of the superior articular process and the transverse process of the inferior vertebrae. The needle's initial entry is in the lower third of the foramen through Kambin's triangle. The soft tissues overlying this target were infiltrated with 2-3 ml. of 1% Lidocaine without Epinephrine.  The spinal needle was then inserted and advanced toward the target using a "trajectory" view along the fluoroscope beam.  Under AP and lateral visualization, the needle was advanced so it did not puncture dura and did not traverse medially beyond the 6 o'clock position of the pedicle. Bi-planar projections  were used to confirm position. Aspiration was confirmed to be negative for CSF and/or blood. A 1-2 ml. volume of Isovue-250 was injected and flow of contrast was noted at each level. Radiographs were obtained for documentation purposes.   After attaining the desired flow of contrast documented above, a 0.5 to 1.0 ml test dose of 0.25% Marcaine was injected into each respective transforaminal space.  The patient was observed for 90 seconds post injection.  After no sensory deficits were reported, and normal lower extremity motor function was noted,   the above injectate was administered so that equal amounts of the injectate were placed at each foramen (level) into the transforaminal epidural space.   Additional Comments:  The patient tolerated the procedure well No complications occurred Dressing: Band-Aid    Post-procedure details: Patient was observed during the procedure. Post-procedure instructions were reviewed.  Patient left the clinic in stable condition.

## 2016-09-14 NOTE — Patient Instructions (Signed)

## 2016-09-26 ENCOUNTER — Ambulatory Visit: Payer: Medicare Other

## 2016-09-29 ENCOUNTER — Encounter: Payer: Self-pay | Admitting: Internal Medicine

## 2016-09-29 ENCOUNTER — Ambulatory Visit (INDEPENDENT_AMBULATORY_CARE_PROVIDER_SITE_OTHER): Payer: Medicare Other | Admitting: Internal Medicine

## 2016-09-29 VITALS — BP 110/70 | HR 84 | Temp 98.3°F | Wt 172.0 lb

## 2016-09-29 DIAGNOSIS — M810 Age-related osteoporosis without current pathological fracture: Secondary | ICD-10-CM

## 2016-09-29 DIAGNOSIS — R002 Palpitations: Secondary | ICD-10-CM

## 2016-09-29 DIAGNOSIS — IMO0001 Reserved for inherently not codable concepts without codable children: Secondary | ICD-10-CM

## 2016-09-29 DIAGNOSIS — R739 Hyperglycemia, unspecified: Secondary | ICD-10-CM | POA: Diagnosis not present

## 2016-09-29 DIAGNOSIS — E785 Hyperlipidemia, unspecified: Secondary | ICD-10-CM

## 2016-09-29 DIAGNOSIS — M5416 Radiculopathy, lumbar region: Secondary | ICD-10-CM | POA: Diagnosis not present

## 2016-09-29 DIAGNOSIS — N8110 Cystocele, unspecified: Secondary | ICD-10-CM

## 2016-09-29 DIAGNOSIS — M5136 Other intervertebral disc degeneration, lumbar region: Secondary | ICD-10-CM | POA: Diagnosis not present

## 2016-09-29 NOTE — Progress Notes (Signed)
Location:  [facility] clinic Provider:  Aaditya Letizia [name], D.O., C.M.D.  Code Status: DNR Goals of Care:  Advanced Directives 09/29/2016  Does Patient Have a Medical Advance Directive? Yes  Type of Advance Directive Out of facility DNR (pink MOST or yellow form)  Does patient want to make changes to medical advance directive? -  Copy of [place] in Chart? -  Would patient like information on creating a medical advance directive? -  Pre-existing out of facility DNR order (yellow form or pink MOST form) Yellow form placed in chart (order not valid for inpatient use)   Chief Complaint  Patient presents with  . Medical Management of Chronic Issues    45mth follow-up    HPI: Patient is a 83 y.o. female seen today for medical management of chronic diseases.    She's noticing a little irregular heartbeat off and on.  Lately, it is more pronounced.  It will do a little jig, then it's fine again.  Mostly feels it when she is sitting or lying down.  Seems like it skips a beat.  Had taken allegra D about 10 years ago and that seemed to cause it.  Reports her sister just had a pacemaker put in for atrial fibrillation.  Only lasts a second when it happens.  Doesn't drink caffeine that would cause it to race.  No chest pain or sob.  Not all of the time or everyday.  Does have more than once per week.    When she saw Dr. Gaynelle Arabian last time, he said he did not do pessaries so saw gyn and had it placed.  Had her 3 week f/u.  It's better, but not 100%.  Can go to the bathroom better with it.  Has trouble during the day to go and then has to go a bunch at night when it's hanging down.    Osteoporosis:  Continues on her calcium with D and mvi that has 1000 units in it.  Had another epidural for her back two weeks ago.  It's beginning to settle down--seems to take 2-3 weeks for it to kick in.  The sciatica is no longer happening.  Only hurts where the disc is.  Still doing her water walking  twice a week.  Still walking other days.    Past Medical History:  Diagnosis Date  . Asthma   . BPPV (benign paroxysmal positional vertigo)   . GERD (gastroesophageal reflux disease)   . Hiatal hernia   . History of adenomatous polyp of colon    tubular adenoma 2005 and  2014 tubular adenoma and  hyperplastic polyp  . History of breast cancer per pt no recurrence   dx 1992 --  s/p  left breast mastectomy and chemotherapy  . Hyperlipidemia LDL goal <100   . Osteoarthritis of right knee   . PONV (postoperative nausea and vomiting)    and "gets weak and light-leaded"  . Sensation of pressure in bladder area   . Vaginal vault prolapse    anterior  . Wears glasses   . Wears hearing aid    left only    Past Surgical History:  Procedure Laterality Date  . CATARACT EXTRACTION W/ INTRAOCULAR LENS  IMPLANT, BILATERAL  2014  . CHOLECYSTECTOMY  1985  . COLONOSCOPY  last one 06-04-2013  . CYSTOCELE REPAIR N/A 02/29/2016   Procedure: ANTERIOR VAULT PROLAPSE REPAIR COLOPLAST [place] FIXATION AUGMENTED AXIS DERMIS REPAIR  ;  Surgeon: Carolan Clines, MD;  Location:  [facility];  Service: Urology;  Laterality: N/A;  . ESOPHAGOGASTRODUODENOSCOPY  08-31-2009  . LAPAROSCOPIC ASSISTED VAGINAL HYSTERECTOMY  01-10-2000   w/ Bilateral Salpingoophorectomy /  Anterior & Posterior repair/  Pubovaginal Sling  . MASTECTOMY Left 1992    Allergies  Allergen Reactions  . Codeine Nausea And Vomiting  . Morphine And Related Other (See Comments)    Nausea, and drop in blood pressure    Allergies as of 09/29/2016      Reactions   Codeine Nausea And Vomiting   Morphine And Related Other (See Comments)   Nausea, and drop in blood pressure      Medication List       Accurate as of 09/29/16 10:11 AM. Always use your most recent med list.          acetaminophen 500 MG tablet Commonly known as:  TYLENOL Take 1,000 mg by mouth every morning.   albuterol 108 (90 Base) MCG/ACT  inhaler Commonly known as:  PROVENTIL HFA;VENTOLIN HFA Inhale 2 puffs into the lungs as needed for wheezing.   CITRACAL + D PO Take 2 tablets by mouth daily.   fexofenadine 180 MG tablet Commonly known as:  ALLEGRA Take 180 mg by mouth every morning.   fluticasone 50 MCG/ACT nasal spray Commonly known as:  FLONASE Place 1 spray into both nostrils every evening.   multivitamin tablet Take 1 tablet by mouth daily.   omeprazole 40 MG capsule Commonly known as:  PRILOSEC Take 1 capsule (40 mg total) by mouth 2 (two) times daily.   pravastatin 40 MG tablet Commonly known as:  PRAVACHOL Take 40 mg by mouth daily.   PROBIOTIC FORMULA PO Take 1 tablet by mouth every evening.   ROLAIDS PO Take 2 tablets by mouth daily as needed (indigestion).       Review of Systems:  Review of Systems  Constitutional: Negative for chills, fever and malaise/fatigue.  Respiratory: Negative for cough and shortness of breath.   Cardiovascular: Positive for palpitations. Negative for chest pain, orthopnea, claudication, leg swelling and PND.  Gastrointestinal: Negative for abdominal pain, blood in stool, constipation, diarrhea and melena.  Genitourinary: Positive for frequency. Negative for dysuria and urgency.       Still some leakage, but not like pre-pessary  Musculoskeletal: Positive for back pain and joint pain. Negative for falls.       Notes right leg will swell in warm weather, has right knee OA and right sided back pain with sciatica  Neurological: Positive for tingling and sensory change. Negative for dizziness, loss of consciousness and weakness.  Endo/Heme/Allergies: Does not bruise/bleed easily.  Psychiatric/Behavioral: Negative for depression and memory loss. The patient is not nervous/anxious and does not have insomnia.     Health Maintenance  Topic Date Due  . TETANUS/TDAP  01/25/2025  . INFLUENZA VACCINE  Completed  . DEXA SCAN  Completed  . PNA vac Low Risk Adult  Completed     Physical Exam: Vitals:   09/29/16 1003  BP: 110/70  Pulse: 84  Temp: 98.3 F (36.8 C)  TempSrc: Oral  SpO2: 97%  Weight: 172 lb (78 kg)   Body mass index is 28.62 kg/m. Physical Exam  Constitutional: She is oriented to person, place, and time. She appears well-developed and well-nourished. No distress.  HENT:  Head: Normocephalic and atraumatic.  Cardiovascular: Normal rate, regular rhythm, normal heart sounds and intact distal pulses.   No murmur heard. Pulmonary/Chest: Effort normal and breath sounds normal. No respiratory  distress.  Abdominal: Soft. Bowel sounds are normal.  Musculoskeletal: Normal range of motion.  Ambulates independently  Neurological: She is alert and oriented to person, place, and time.  Skin: Skin is warm and dry. Capillary refill takes less than 2 seconds.  Psychiatric: She has a normal mood and affect.    Labs reviewed: Basic Metabolic Panel:  Recent Labs  02/29/16 1221 03/01/16 0335 05/30/16 1105  NA 137 138 140  K 3.9 3.8 4.2  CL 106 108 107  CO2 25 24 26   GLUCOSE 131* 129* 96  BUN 10 13 14   CREATININE 0.75 0.68 0.82  CALCIUM 8.3* 8.4* 9.2   Liver Function Tests:  Recent Labs  01/26/16 0802 05/30/16 1105  AST 16 20  ALT 12 14  ALKPHOS 63 54  BILITOT 0.3 0.5  PROT 6.7 6.5  ALBUMIN 4.1 4.0   No results for input(s): LIPASE, AMYLASE in the last 8760 hours. No results for input(s): AMMONIA in the last 8760 hours. CBC:  Recent Labs  01/26/16 0802  02/29/16 1221 03/01/16 0335 05/30/16 1105  WBC 6.6  --  13.5* 13.6* 6.2  NEUTROABS 3.5  --   --   --  3,410  HGB  --   < > 12.6 11.8* 13.1  HCT 38.4  --  37.4 34.5* 39.7  MCV 90  --  90.6 90.3 89.8  PLT 225  --  212 212 230  < > = values in this interval not displayed. Lipid Panel:  Recent Labs  01/26/16 0802  CHOL 187  HDL 59  LDLCALC 102*  TRIG 129  CHOLHDL 3.2   Lab Results  Component Value Date   HGBA1C 5.4 05/30/2016    Procedures since last  visit: Xr C-arm No Report  Result Date: 09/14/2016 Please see Notes or Procedures tab for imaging impression.  EKG today:    Assessment/Plan 1. Bladder prolapse, female, acquired -seems surgical procedure failed w/in 2 mos -now has pessary in place which is helping urinary leakage  2. Senile osteoporosis -cont prolia and ca with D and special MVI that contains 1000 units of vitamin D -next prolia is 12/16/16  3. Palpitations - has had for a while, but recently happening more than 1 time per week, usually at rest and no associated symptoms - EKG 12-Lead today with HR 84 bpm, PACs were noted which may be what she is feeling, but due to age and family h/o afib, will have her see cardiology for further evaluation to ensure no afib   4. Radicular pain of right lower back -better with epidural  5. DDD (degenerative disc disease), lumbar -ongoing on right  6. Hyperlipidemia LDL goal <100 -lipids satisfactory at 102 LDL last check 8 mos ago, report before physical  Labs/tests ordered:   Orders Placed This Encounter  Procedures  . EKG 12-Lead    Order Specific Question:   Where should this test be performed    Answer:   Other  [facility], cmp, flp, hba1c  Next appt:  12/16/2016   Emilyrose Darrah L. Vanesa Renier, D.O. [facility] [address], [place] [address] Cell Phone (Mon-Fri 8am-5pm):  [phone] On Call:  [phone] & follow prompts after 5pm & weekends Office Phone:  [phone] Office Fax:  [phone]

## 2016-10-20 ENCOUNTER — Encounter: Payer: Self-pay | Admitting: Cardiology

## 2016-10-31 ENCOUNTER — Encounter: Payer: Self-pay | Admitting: Cardiology

## 2016-10-31 ENCOUNTER — Ambulatory Visit (INDEPENDENT_AMBULATORY_CARE_PROVIDER_SITE_OTHER): Payer: Medicare Other | Admitting: Cardiology

## 2016-10-31 VITALS — BP 124/64 | HR 86 | Ht 65.0 in | Wt 173.0 lb

## 2016-10-31 DIAGNOSIS — R002 Palpitations: Secondary | ICD-10-CM | POA: Insufficient documentation

## 2016-10-31 DIAGNOSIS — Z8249 Family history of ischemic heart disease and other diseases of the circulatory system: Secondary | ICD-10-CM | POA: Diagnosis not present

## 2016-10-31 DIAGNOSIS — E782 Mixed hyperlipidemia: Secondary | ICD-10-CM

## 2016-10-31 NOTE — Patient Instructions (Signed)
Medication Instructions:   Your physician recommends that you continue on your current medications as directed. Please refer to the Current Medication list given to you today.    Labwork:  TODAY--BMET AND TSH    Testing/Procedures:  Your physician has requested that you have an echocardiogram. Echocardiography is a painless test that uses sound waves to create images of your heart. It provides your doctor with information about the size and shape of your heart and how well your heart's chambers and valves are working. This procedure takes approximately one hour. There are no restrictions for this procedure.   Your physician has recommended that you wear a 24 HOUR holter monitor. Holter monitors are medical devices that record the heart's electrical activity. Doctors most often use these monitors to diagnose arrhythmias. Arrhythmias are problems with the speed or rhythm of the heartbeat. The monitor is a small, portable device. You can wear one while you do your normal daily activities. This is usually used to diagnose what is causing palpitations/syncope (passing out).     Follow-Up:  2 MONTHS WITH DR Meda Coffee       If you need a refill on your cardiac medications before your next appointment, please call your pharmacy.

## 2016-10-31 NOTE — Progress Notes (Signed)
Cardiology Office Note    Date:  10/31/2016   ID:  Rebecca Guzman, DOB 03/14/1934, MRN [id number]  PCP:  [name], DO  Cardiologist:  Ena Dawley, MD   Referring physician: [name], DO   History of Present Illness:  Rebecca Guzman is a 83 y.o. female who is a very pleasant patient coming with concern of palpitations. She states that she has been experiencing palpitations with increased frequency currently every day especially at rest and at night. They would last a few seconds and are not associated with shortness of breath chest pain dizziness or syncope other very uncomfortable and she is concerned. They usually happen on days when she exerts herself. The patient exercises frequently as part of Silver sneakers program and doesn't experience any chest pain or shortness of breath. She has a very remote history of smoking when she was young. Her family history includes early coronary artery disease in her father died at age of 29 secondary to heart attack. The patient denies any lower extremity edema, no claudication no orthopnea or proximal nocturnal dyspnea.    Past Medical History:  Diagnosis Date  . Asthma   . BPPV (benign paroxysmal positional vertigo)   . GERD (gastroesophageal reflux disease)   . Hiatal hernia   . History of adenomatous polyp of colon    tubular adenoma 2005 and  2014 tubular adenoma and  hyperplastic polyp  . History of breast cancer per pt no recurrence   dx 1992 --  s/p  left breast mastectomy and chemotherapy  . Hyperlipidemia LDL goal <100   . Osteoarthritis of right knee   . PONV (postoperative nausea and vomiting)    and "gets weak and light-leaded"  . Sensation of pressure in bladder area   . Vaginal vault prolapse    anterior  . Wears glasses   . Wears hearing aid    left only    Past Surgical History:  Procedure Laterality Date  . CATARACT EXTRACTION W/ INTRAOCULAR LENS  IMPLANT, BILATERAL  2014  . CHOLECYSTECTOMY  1985  .  COLONOSCOPY  last one 06-04-2013  . CYSTOCELE REPAIR N/A 02/29/2016   Procedure: ANTERIOR VAULT PROLAPSE REPAIR COLOPLAST [place] FIXATION AUGMENTED AXIS DERMIS REPAIR  ;  Surgeon: Carolan Clines, MD;  Location: [facility];  Service: Urology;  Laterality: N/A;  . ESOPHAGOGASTRODUODENOSCOPY  08-31-2009  . LAPAROSCOPIC ASSISTED VAGINAL HYSTERECTOMY  01-10-2000   w/ Bilateral Salpingoophorectomy /  Anterior & Posterior repair/  Pubovaginal Sling  . MASTECTOMY Left 1992    Current Medications: Outpatient Medications Prior to Visit  Medication Sig Dispense Refill  . acetaminophen (TYLENOL) 500 MG tablet Take 1,000 mg by mouth every morning.     Marland Kitchen albuterol (PROVENTIL HFA;VENTOLIN HFA) 108 (90 BASE) MCG/ACT inhaler Inhale 2 puffs into the lungs as needed for wheezing.    . Ca Carbonate-Mag Hydroxide (ROLAIDS PO) Take 2 tablets by mouth daily as needed (indigestion).    . Calcium Citrate-Vitamin D (CITRACAL + D PO) Take 2 tablets by mouth daily.    . fexofenadine (ALLEGRA) 180 MG tablet Take 180 mg by mouth every morning.     . fluticasone (FLONASE) 50 MCG/ACT nasal spray Place 1 spray into both nostrils every evening.     . Multiple Vitamin (MULTIVITAMIN) tablet Take 1 tablet by mouth daily.    Marland Kitchen omeprazole (PRILOSEC) 40 MG capsule Take 1 capsule (40 mg total) by mouth 2 (two) times daily. 60 capsule 5  . pravastatin (  PRAVACHOL) 40 MG tablet Take 40 mg by mouth daily.    . Probiotic Product (PROBIOTIC FORMULA PO) Take 1 tablet by mouth every evening.      No facility-administered medications prior to visit.      Allergies:   Codeine and Morphine and related   Social History   Social History  . Marital status: Widowed    Spouse name: N/A  . Number of children: 4  . Years of education: N/A   Occupational History  . Retired Pharmacist, hospital Retired   Social History Main Topics  . Smoking status: Former Smoker    Years: 13.00    Types: Cigarettes    Quit date: 10/12/1983    . Smokeless tobacco: Never Used  . Alcohol use No  . Drug use: No  . Sexual activity: Not Asked   Other Topics Concern  . None   Social History Narrative  . None     Family History:  The patient's family history includes Breast cancer in her sister; Colon cancer in her mother; Heart attack in her mother; Heart disease in her father; Hypertension in her mother.   ROS:   Please see the history of present illness.    ROS All other systems reviewed and are negative.  PHYSICAL EXAM:   VS:  BP 124/64   Pulse 86   Ht 5\' 5"  (1.651 m)   Wt 173 lb (78.5 kg)   LMP 11/21/1985   BMI 28.79 kg/m    GEN: Well nourished, well developed, in no acute distress  HEENT: normal  Neck: no JVD, carotid bruits, or masses Cardiac: RRR; no murmurs, rubs, or gallops,no edema  Respiratory:  clear to auscultation bilaterally, normal work of breathing GI: soft, nontender, nondistended, + BS MS: no deformity or atrophy  Skin: warm and dry, no rash Neuro:  Alert and Oriented x 3, Strength and sensation are intact Psych: euthymic mood, full affect  Wt Readings from Last 3 Encounters:  10/31/16 173 lb (78.5 kg)  09/29/16 172 lb (78 kg)  02/29/16 167 lb (75.8 kg)    Studies/Labs Reviewed:   EKG:  EKG is ordered today.  The ekg ordered today demonstrates Normal sinus rhythm, normal EKG. Personally reviewed.  Recent Labs: 05/30/2016: ALT 14; BUN 14; Creat 0.82; Hemoglobin 13.1; Platelets 230; Potassium 4.2; Sodium 140   Lipid Panel    Component Value Date/Time   CHOL 187 01/26/2016 0802   TRIG 129 01/26/2016 0802   HDL 59 01/26/2016 0802   CHOLHDL 3.2 01/26/2016 0802   LDLCALC 102 (H) 01/26/2016 0802    Additional studies/ records that were reviewed today include:    ASSESSMENT:    1. Mixed hyperlipidemia   2. Palpitations   3. Family history of early CAD    PLAN:  In order of problems listed above:  1. Palpitations, we will check echocardiogram to make sure that she has normal  cardiac anatomy, and normal heart function. We will also obtain 24-hour Holter monitor to further evaluate for ischemia. She is most probably experiencing frequent PVCs. We will check her TSH and electrolytes today. The most worrisome arrhythmia would be atrial fibrillation that can potentially cause stroke. 2. Blood pressure is well controlled 3. Hyperlipidemia followed by her primary care physician, she is on pravastatin and she is tolerating it well.  Medication Adjustments/Labs and Tests Ordered: Current medicines are reviewed at length with the patient today.  Concerns regarding medicines are outlined above.  Medication changes, Labs and Tests ordered today  are listed in the Patient Instructions below. Patient Instructions  Medication Instructions:   Your physician recommends that you continue on your current medications as directed. Please refer to the Current Medication list given to you today.    Labwork:  TODAY--BMET AND TSH    Testing/Procedures:  Your physician has requested that you have an echocardiogram. Echocardiography is a painless test that uses sound waves to create images of your heart. It provides your doctor with information about the size and shape of your heart and how well your heart's chambers and valves are working. This procedure takes approximately one hour. There are no restrictions for this procedure.   Your physician has recommended that you wear a 24 HOUR holter monitor. Holter monitors are medical devices that record the heart's electrical activity. Doctors most often use these monitors to diagnose arrhythmias. Arrhythmias are problems with the speed or rhythm of the heartbeat. The monitor is a small, portable device. You can wear one while you do your normal daily activities. This is usually used to diagnose what is causing palpitations/syncope (passing out).     Follow-Up:  2 MONTHS WITH DR Meda Coffee       If you need a refill on your cardiac  medications before your next appointment, please call your pharmacy.      Signed, Ena Dawley, MD  10/31/2016 11:23 AM    [facility] [place], [place], [place]  [address] Phone: [phone]; Fax: [phone]

## 2016-11-01 LAB — BASIC METABOLIC PANEL
BUN/Creatinine Ratio: 20 (ref 12–28)
BUN: 15 mg/dL (ref 8–27)
CO2: 26 mmol/L (ref 18–29)
Calcium: 9.3 mg/dL (ref 8.7–10.3)
Chloride: 104 mmol/L (ref 96–106)
Creatinine, Ser: 0.75 mg/dL (ref 0.57–1.00)
GFR calc Af Amer: 86 mL/min/{1.73_m2} (ref >59)
GFR calc non Af Amer: 75 mL/min/{1.73_m2} (ref >59)
Glucose: 81 mg/dL (ref 65–99)
Potassium: 4.1 mmol/L (ref 3.5–5.2)
Sodium: 142 mmol/L (ref 134–144)

## 2016-11-01 LAB — TSH: TSH: 2.82 u[IU]/mL (ref 0.450–4.500)

## 2016-11-15 ENCOUNTER — Ambulatory Visit (HOSPITAL_COMMUNITY): Payer: Medicare Other | Attending: Internal Medicine

## 2016-11-15 ENCOUNTER — Ambulatory Visit (INDEPENDENT_AMBULATORY_CARE_PROVIDER_SITE_OTHER): Payer: Medicare Other

## 2016-11-15 ENCOUNTER — Other Ambulatory Visit: Payer: Self-pay

## 2016-11-15 DIAGNOSIS — I340 Nonrheumatic mitral (valve) insufficiency: Secondary | ICD-10-CM | POA: Diagnosis not present

## 2016-11-15 DIAGNOSIS — R002 Palpitations: Secondary | ICD-10-CM | POA: Diagnosis not present

## 2016-11-15 DIAGNOSIS — I361 Nonrheumatic tricuspid (valve) insufficiency: Secondary | ICD-10-CM | POA: Diagnosis not present

## 2016-11-15 DIAGNOSIS — I501 Left ventricular failure: Secondary | ICD-10-CM | POA: Insufficient documentation

## 2016-11-15 LAB — ECHOCARDIOGRAM COMPLETE
Ao-asc: 34 cm
Area-P 1/2: 4 cm2
E decel time: 187 msec
E/e' ratio: 16.61
FS: 31 % (ref 28–44)
IVS/LV PW RATIO, ED: 1.09
LA ID, A-P, ES: 31 mm
LA diam end sys: 31 mm
LA diam index: 1.67 cm/m2
LA vol A4C: 36.3 ml
LA vol index: 23.2 mL/m2
LA vol: 43.1 mL
LV E/e' medial: 16.61
LV E/e'average: 16.61
LV PW d: 11 mm — AB (ref 0.6–1.1)
LV e' LATERAL: 6.2 cm/s
LVOT SV: 45 mL
LVOT VTI: 19.7 cm
LVOT area: 2.27 cm2
LVOT diameter: 17 mm
LVOT peak vel: 92.3 cm/s
MV Dec: 187
MV Peak grad: 4 mmHg
MV pk A vel: 113 m/s
MV pk E vel: 103 m/s
P 1/2 time: 55 ms
RV sys press: 37 mmHg
Reg peak vel: 271 cm/s
TAPSE: 16.9 mm
TDI e' lateral: 6.2
TDI e' medial: 5
TR max vel: 271 cm/s

## 2016-11-23 ENCOUNTER — Telehealth: Payer: Self-pay | Admitting: *Deleted

## 2016-11-23 MED ORDER — METOPROLOL TARTRATE 25 MG PO TABS: 12.5000 mg | ORAL_TABLET | Freq: Two times a day (BID) | ORAL | 2 refills | Status: DC

## 2016-11-23 NOTE — Telephone Encounter (Signed)
-----   Message from [name], MD sent at 11/23/2016  1:06 AM EDT -----  frequent PACs (700 in 34 hrs).  Few very short runs of atrial tachycardia with the longest lasting 4 beats  I would start a very low dose metoprolol 12.5 mg po BID.

## 2016-11-23 NOTE — Telephone Encounter (Signed)
Notified the pt that per Dr Meda Coffee, her 24 hour holter monitor showed that she had frequent PACs (700 in 34 hrs), few very short runs of atrial tachycardia with the longest lasting 4 beats. Informed the pt that Dr Meda Coffee recommends that we start her on a very low dose metoprolol 12.5 mg po BID.  Confirmed the pharmacy of choice with the pt.  Pt verbalized understanding and agrees with this plan.

## 2016-12-13 ENCOUNTER — Encounter: Payer: Self-pay | Admitting: Cardiology

## 2016-12-16 ENCOUNTER — Ambulatory Visit (INDEPENDENT_AMBULATORY_CARE_PROVIDER_SITE_OTHER): Payer: Medicare Other | Admitting: *Deleted

## 2016-12-16 DIAGNOSIS — M810 Age-related osteoporosis without current pathological fracture: Secondary | ICD-10-CM

## 2016-12-16 MED ORDER — DENOSUMAB 60 MG/ML ~~LOC~~ SOLN
60.0000 mg | Freq: Once | SUBCUTANEOUS | Status: AC
  Administered 2016-12-16: 60 mg via SUBCUTANEOUS

## 2016-12-30 ENCOUNTER — Encounter: Payer: Self-pay | Admitting: Cardiology

## 2016-12-30 ENCOUNTER — Ambulatory Visit (INDEPENDENT_AMBULATORY_CARE_PROVIDER_SITE_OTHER): Payer: Medicare Other | Admitting: Cardiology

## 2016-12-30 VITALS — BP 118/64 | HR 58 | Ht 65.0 in | Wt 173.8 lb

## 2016-12-30 DIAGNOSIS — I491 Atrial premature depolarization: Secondary | ICD-10-CM | POA: Diagnosis not present

## 2016-12-30 DIAGNOSIS — E782 Mixed hyperlipidemia: Secondary | ICD-10-CM | POA: Diagnosis not present

## 2016-12-30 DIAGNOSIS — I471 Supraventricular tachycardia: Secondary | ICD-10-CM | POA: Diagnosis not present

## 2016-12-30 DIAGNOSIS — R002 Palpitations: Secondary | ICD-10-CM | POA: Diagnosis not present

## 2016-12-30 NOTE — Progress Notes (Signed)
Cardiology Office Note    Date:  12/30/2016   ID:  Rebecca Guzman, DOB Oct 21, 1933, MRN [id number]  PCP:  [name], DO  Cardiologist:  Ena Dawley, MD   Referring physician: [name], DO   History of Present Illness:  Rebecca Guzman is a 83 y.o. female who is a very pleasant patient coming with concern of palpitations. She states that she has been experiencing palpitations with increased frequency currently every day especially at rest and at night. They would last a few seconds and are not associated with shortness of breath chest pain dizziness or syncope other very uncomfortable and she is concerned. They usually happen on days when she exerts herself. The patient exercises frequently as part of Silver sneakers program and doesn't experience any chest pain or shortness of breath. She has a very remote history of smoking when she was young. Her family history includes early coronary artery disease in her father died at age of 28 secondary to heart attack. The patient denies any lower extremity edema, no claudication no orthopnea or proximal nocturnal dyspnea.  12/29/2016 - patient underwent Holter monitoring that showed frequent PACs (700 in 34 hrs) and few very short runs of atrial tachycardia with the longest lasting 4 beats. She was started on metoprolol 12.5 mg by mouth twice a day with significant improvement of symptoms. She also had an echocardiogram performed that showed LVEF 60-65% and grade 1 diastolic dysfunction, trivial MR, normal left atrial size and mild TR.   Past Medical History:  Diagnosis Date  . Asthma   . BPPV (benign paroxysmal positional vertigo)   . GERD (gastroesophageal reflux disease)   . Hiatal hernia   . History of adenomatous polyp of colon    tubular adenoma 2005 and  2014 tubular adenoma and  hyperplastic polyp  . History of breast cancer per pt no recurrence   dx 1992 --  s/p  left breast mastectomy and chemotherapy  . Hyperlipidemia  LDL goal <100   . Osteoarthritis of right knee   . PONV (postoperative nausea and vomiting)    and "gets weak and light-leaded"  . Sensation of pressure in bladder area   . Vaginal vault prolapse    anterior  . Wears glasses   . Wears hearing aid    left only    Past Surgical History:  Procedure Laterality Date  . CATARACT EXTRACTION W/ INTRAOCULAR LENS  IMPLANT, BILATERAL  2014  . CHOLECYSTECTOMY  1985  . COLONOSCOPY  last one 06-04-2013  . CYSTOCELE REPAIR N/A 02/29/2016   Procedure: ANTERIOR VAULT PROLAPSE REPAIR COLOPLAST [place] FIXATION AUGMENTED AXIS DERMIS REPAIR  ;  Surgeon: Carolan Clines, MD;  Location: [facility];  Service: Urology;  Laterality: N/A;  . ESOPHAGOGASTRODUODENOSCOPY  08-31-2009  . LAPAROSCOPIC ASSISTED VAGINAL HYSTERECTOMY  01-10-2000   w/ Bilateral Salpingoophorectomy /  Anterior & Posterior repair/  Pubovaginal Sling  . MASTECTOMY Left 1992    Current Medications: Outpatient Medications Prior to Visit  Medication Sig Dispense Refill  . acetaminophen (TYLENOL) 500 MG tablet Take 1,000 mg by mouth every morning.     Marland Kitchen albuterol (PROVENTIL HFA;VENTOLIN HFA) 108 (90 BASE) MCG/ACT inhaler Inhale 2 puffs into the lungs as needed for wheezing.    . Ca Carbonate-Mag Hydroxide (ROLAIDS PO) Take 2 tablets by mouth daily as needed (indigestion).    . Calcium Citrate-Vitamin D (CITRACAL + D PO) Take 2 tablets by mouth daily.    . fexofenadine (ALLEGRA) 180  MG tablet Take 180 mg by mouth every morning.     . fluticasone (FLONASE) 50 MCG/ACT nasal spray Place 1 spray into both nostrils every evening.     . metoprolol tartrate (LOPRESSOR) 25 MG tablet Take 0.5 tablets (12.5 mg total) by mouth 2 (two) times daily. 180 tablet 2  . Multiple Vitamin (MULTIVITAMIN) tablet Take 1 tablet by mouth daily.    Marland Kitchen omeprazole (PRILOSEC) 40 MG capsule Take 1 capsule (40 mg total) by mouth 2 (two) times daily. 60 capsule 5  . pravastatin (PRAVACHOL) 40 MG  tablet Take 40 mg by mouth daily.    . Probiotic Product (PROBIOTIC FORMULA PO) Take 1 tablet by mouth every evening.      No facility-administered medications prior to visit.      Allergies:   Codeine and Morphine and related   Social History   Social History  . Marital status: Widowed    Spouse name: N/A  . Number of children: 4  . Years of education: N/A   Occupational History  . Retired Pharmacist, hospital Retired   Social History Main Topics  . Smoking status: Former Smoker    Years: 13.00    Types: Cigarettes    Quit date: 10/12/1983  . Smokeless tobacco: Never Used  . Alcohol use No  . Drug use: No  . Sexual activity: Not Asked   Other Topics Concern  . None   Social History Narrative  . None     Family History:  The patient's family history includes Breast cancer in her sister; Colon cancer in her mother; Heart attack in her mother; Heart disease in her father; Hypertension in her mother.   ROS:   Please see the history of present illness.    ROS All other systems reviewed and are negative.  PHYSICAL EXAM:   VS:  BP 118/64   Pulse (!) 58   Ht 5\' 5"  (1.651 m)   Wt 173 lb 12.8 oz (78.8 kg)   LMP 11/21/1985   BMI 28.92 kg/m    GEN: Well nourished, well developed, in no acute distress  HEENT: normal  Neck: no JVD, carotid bruits, or masses Cardiac: RRR; no murmurs, rubs, or gallops,no edema  Respiratory:  clear to auscultation bilaterally, normal work of breathing GI: soft, nontender, nondistended, + BS MS: no deformity or atrophy  Skin: warm and dry, no rash Neuro:  Alert and Oriented x 3, Strength and sensation are intact Psych: euthymic mood, full affect  Wt Readings from Last 3 Encounters:  12/30/16 173 lb 12.8 oz (78.8 kg)  10/31/16 173 lb (78.5 kg)  09/29/16 172 lb (78 kg)    Studies/Labs Reviewed:   EKG:  EKG is ordered today.  The ekg ordered today demonstrates Normal sinus rhythm, normal EKG. Personally reviewed.  Recent Labs: 05/30/2016: ALT 14;  Hemoglobin 13.1; Platelets 230 10/31/2016: BUN 15; Creatinine, Ser 0.75; Potassium 4.1; Sodium 142; TSH 2.820   Lipid Panel    Component Value Date/Time   CHOL 187 01/26/2016 0802   TRIG 129 01/26/2016 0802   HDL 59 01/26/2016 0802   CHOLHDL 3.2 01/26/2016 0802   LDLCALC 102 (H) 01/26/2016 0802    Additional studies/ records that were reviewed today include:   TTE: 10/2016 - Left ventricle: The cavity size was normal. Wall thickness was   increased in a pattern of mild LVH. Systolic function was normal.   The estimated ejection fraction was in the range of 60% to 65%.   Wall motion was  normal; there were no regional wall motion   abnormalities. Doppler parameters are consistent with abnormal   left ventricular relaxation (grade 1 diastolic dysfunction). The   E/e&' ratio is >15, suggesting elevated LV filling pressure. - Mitral valve: Mildly thickened and sclerotic anterior leaflet.   There was trivial regurgitation. - Left atrium: The atrium was normal in size. - Tricuspid valve: There was mild regurgitation. - Pulmonary arteries: PA peak pressure: 37 mm Hg (S). - Inferior vena cava: The vessel was normal in size. The   respirophasic diameter changes were in the normal range (>= 50%),   consistent with normal central venous pressure.  Impressions:  - LVEF 60-65%, mild LVH, normal wall motion, grade 1 DD with   elevated LV filling pressure, trivial MR, normal LA size, mild   TR, RVSP 37 mmHg, normal IVC.  ASSESSMENT:    1. Palpitations   2. Atrial tachycardia ([place])   3. PAC (premature atrial contraction)   4. Mixed hyperlipidemia    PLAN:  In order of problems listed above:  1. Palpitations, Normal echocardiogram, normal left atrial size, normal labs including electrolytes and TSH. Holter monitor showed 700 PACs in 36 hours and few very short runs of atrial tachycardia, significant symptoms improvement with metoprolol 12.5 mg by mouth twice a day, though continue.   2. Blood pressure is well controlled 3. Hyperlipidemia followed by her primary care physician, she is on pravastatin and she is tolerating it well.  Medication Adjustments/Labs and Tests Ordered: Current medicines are reviewed at length with the patient today.  Concerns regarding medicines are outlined above.  Medication changes, Labs and Tests ordered today are listed in the Patient Instructions below. Patient Instructions  Medication Instructions:   Your physician recommends that you continue on your current medications as directed. Please refer to the Current Medication list given to you today.    Follow-Up:  Your physician wants you to follow-up in: [place] will receive a reminder letter in the mail two months in advance. If you don't receive a letter, please call our office to schedule the follow-up appointment.        If you need a refill on your cardiac medications before your next appointment, please call your pharmacy.      Signed, Ena Dawley, MD  12/30/2016 10:23 AM    [facility] [place], [place], [place]  [address] Phone: [phone]; Fax: [phone]

## 2016-12-30 NOTE — Patient Instructions (Signed)

## 2017-01-11 ENCOUNTER — Other Ambulatory Visit: Payer: Self-pay | Admitting: Internal Medicine

## 2017-02-09 ENCOUNTER — Other Ambulatory Visit: Payer: Self-pay | Admitting: Internal Medicine

## 2017-02-09 DIAGNOSIS — R1013 Epigastric pain: Secondary | ICD-10-CM

## 2017-02-09 DIAGNOSIS — R079 Chest pain, unspecified: Secondary | ICD-10-CM

## 2017-02-09 DIAGNOSIS — K219 Gastro-esophageal reflux disease without esophagitis: Secondary | ICD-10-CM

## 2017-02-13 ENCOUNTER — Other Ambulatory Visit: Payer: Medicare Other

## 2017-02-13 DIAGNOSIS — R002 Palpitations: Secondary | ICD-10-CM

## 2017-02-13 DIAGNOSIS — R739 Hyperglycemia, unspecified: Secondary | ICD-10-CM

## 2017-02-13 DIAGNOSIS — E785 Hyperlipidemia, unspecified: Secondary | ICD-10-CM

## 2017-02-13 LAB — COMPLETE METABOLIC PANEL WITH GFR
ALT: 13 U/L (ref 6–29)
AST: 16 U/L (ref 10–35)
Albumin: 4.2 g/dL (ref 3.6–5.1)
Alkaline Phosphatase: 54 U/L (ref 33–130)
BUN: 17 mg/dL (ref 7–25)
CO2: 22 mmol/L (ref 20–31)
Calcium: 9.2 mg/dL (ref 8.6–10.4)
Chloride: 105 mmol/L (ref 98–110)
Creat: 0.84 mg/dL (ref 0.60–0.88)
GFR, Est African American: 75 mL/min (ref >=60)
GFR, Est Non African American: 65 mL/min (ref >=60)
Glucose, Bld: 97 mg/dL (ref 65–99)
Potassium: 4.5 mmol/L (ref 3.5–5.3)
Sodium: 140 mmol/L (ref 135–146)
Total Bilirubin: 0.5 mg/dL (ref 0.2–1.2)
Total Protein: 6.7 g/dL (ref 6.1–8.1)

## 2017-02-13 LAB — LIPID PANEL
Cholesterol: 186 mg/dL (ref <200)
HDL: 58 mg/dL (ref >50)
LDL Cholesterol: 95 mg/dL (ref <100)
Total CHOL/HDL Ratio: 3.2 Ratio (ref <5.0)
Triglycerides: 166 mg/dL — ABNORMAL HIGH (ref <150)
VLDL: 33 mg/dL — ABNORMAL HIGH (ref <30)

## 2017-02-13 LAB — CBC WITH DIFFERENTIAL/PLATELET
Basophils Absolute: 64 cells/uL (ref 0–200)
Basophils Relative: 1 %
Eosinophils Absolute: 320 cells/uL (ref 15–500)
Eosinophils Relative: 5 %
HCT: 41.5 % (ref 35.0–45.0)
Hemoglobin: 13.6 g/dL (ref 11.7–15.5)
Lymphocytes Relative: 28 %
Lymphs Abs: 1792 cells/uL (ref 850–3900)
MCH: 30.8 pg (ref 27.0–33.0)
MCHC: 32.8 g/dL (ref 32.0–36.0)
MCV: 93.9 fL (ref 80.0–100.0)
MPV: 10.5 fL (ref 7.5–12.5)
Monocytes Absolute: 384 cells/uL (ref 200–950)
Monocytes Relative: 6 %
Neutro Abs: 3840 cells/uL (ref 1500–7800)
Neutrophils Relative %: 60 %
Platelets: 218 10*3/uL (ref 140–400)
RBC: 4.42 MIL/uL (ref 3.80–5.10)
RDW: 13.5 % (ref 11.0–15.0)
WBC: 6.4 10*3/uL (ref 3.8–10.8)

## 2017-02-14 LAB — HEMOGLOBIN A1C
Hgb A1c MFr Bld: 5.4 % (ref <5.7)
Mean Plasma Glucose: 108 mg/dL

## 2017-02-16 ENCOUNTER — Ambulatory Visit: Payer: Medicare Other

## 2017-02-16 ENCOUNTER — Telehealth: Payer: Self-pay | Admitting: Internal Medicine

## 2017-02-16 ENCOUNTER — Encounter: Payer: Medicare Other | Admitting: Internal Medicine

## 2017-02-16 ENCOUNTER — Encounter: Payer: Self-pay | Admitting: *Deleted

## 2017-02-16 NOTE — Telephone Encounter (Signed)
Called pt's daughter Lysbeth Galas to reschedule AWV-S to 02/27/17 but no answer nor voicemail option VDM (DD)

## 2017-02-17 ENCOUNTER — Encounter: Payer: Self-pay | Admitting: Internal Medicine

## 2017-02-20 ENCOUNTER — Other Ambulatory Visit: Payer: Self-pay | Admitting: Internal Medicine

## 2017-02-20 DIAGNOSIS — Z1231 Encounter for screening mammogram for malignant neoplasm of breast: Secondary | ICD-10-CM

## 2017-03-08 ENCOUNTER — Encounter: Payer: Medicare Other | Admitting: Internal Medicine

## 2017-03-14 ENCOUNTER — Ambulatory Visit: Payer: Medicare Other

## 2017-03-31 ENCOUNTER — Other Ambulatory Visit: Payer: Self-pay | Admitting: Internal Medicine

## 2017-04-03 ENCOUNTER — Ambulatory Visit (INDEPENDENT_AMBULATORY_CARE_PROVIDER_SITE_OTHER): Payer: Medicare Other

## 2017-04-03 VITALS — BP 132/68 | HR 89 | Temp 98.1°F | Ht 65.0 in | Wt 173.0 lb

## 2017-04-03 DIAGNOSIS — Z0000 Encounter for general adult medical examination without abnormal findings: Secondary | ICD-10-CM

## 2017-04-03 DIAGNOSIS — Z23 Encounter for immunization: Secondary | ICD-10-CM

## 2017-04-03 NOTE — Progress Notes (Signed)
Subjective:   Rebecca Guzman is a 83 y.o. female who presents for Medicare Annual (Subsequent) preventive examination.  Last AWV- 01/28/2016  Objective:     Vitals: BP 132/68 (BP Location: Left Arm, Patient Position: Sitting)   Pulse 89   Temp 98.1 F (36.7 C) (Oral)   Ht 5\' 5"  (1.651 m)   Wt 173 lb (78.5 kg)   LMP 11/21/1985   SpO2 97%   BMI 28.79 kg/m   Body mass index is 28.79 kg/m.   Tobacco History  Smoking Status  . Former Smoker  . Years: 13.00  . Types: Cigarettes  . Quit date: 10/12/1983  Smokeless Tobacco  . Never Used     Counseling given: Not Answered   Past Medical History:  Diagnosis Date  . Asthma   . BPPV (benign paroxysmal positional vertigo)   . GERD (gastroesophageal reflux disease)   . Hiatal hernia   . History of adenomatous polyp of colon    tubular adenoma 2005 and  2014 tubular adenoma and  hyperplastic polyp  . History of breast cancer per pt no recurrence   dx 1992 --  s/p  left breast mastectomy and chemotherapy  . Hyperlipidemia LDL goal <100   . Osteoarthritis of right knee   . PONV (postoperative nausea and vomiting)    and "gets weak and light-leaded"  . Sensation of pressure in bladder area   . Vaginal vault prolapse    anterior  . Wears glasses   . Wears hearing aid    left only   Past Surgical History:  Procedure Laterality Date  . CATARACT EXTRACTION W/ INTRAOCULAR LENS  IMPLANT, BILATERAL  2014  . CHOLECYSTECTOMY  1985  . COLONOSCOPY  last one 06-04-2013  . CYSTOCELE REPAIR N/A 02/29/2016   Procedure: ANTERIOR VAULT PROLAPSE REPAIR COLOPLAST [place] FIXATION AUGMENTED AXIS DERMIS REPAIR  ;  Surgeon: Carolan Clines, MD;  Location: [facility];  Service: Urology;  Laterality: N/A;  . ESOPHAGOGASTRODUODENOSCOPY  08-31-2009  . LAPAROSCOPIC ASSISTED VAGINAL HYSTERECTOMY  01-10-2000   w/ Bilateral Salpingoophorectomy /  Anterior & Posterior repair/  Pubovaginal Sling  . MASTECTOMY Left 1992    Family History  Problem Relation Age of Onset  . Heart attack Mother   . Colon cancer Mother   . Hypertension Mother   . Heart disease Father   . Breast cancer Sister    History  Sexual Activity  . Sexual activity: Not on file    Outpatient Encounter Prescriptions as of 04/03/2017  Medication Sig  . acetaminophen (TYLENOL) 500 MG tablet Take 1,000 mg by mouth every morning.   Marland Kitchen albuterol (PROVENTIL HFA;VENTOLIN HFA) 108 (90 BASE) MCG/ACT inhaler Inhale 2 puffs into the lungs as needed for wheezing.  . Ca Carbonate-Mag Hydroxide (ROLAIDS PO) Take 2 tablets by mouth daily as needed (indigestion).  . Calcium Citrate-Vitamin D (CITRACAL + D PO) Take 2 tablets by mouth daily.  . fexofenadine (ALLEGRA) 180 MG tablet Take 180 mg by mouth every morning.   . fluticasone (FLONASE) 50 MCG/ACT nasal spray Place 1 spray into both nostrils every evening.   . metoprolol tartrate (LOPRESSOR) 25 MG tablet Take 12.5 mg by mouth 2 (two) times daily.  . Multiple Vitamin (MULTIVITAMIN) tablet Take 1 tablet by mouth daily.  Marland Kitchen omeprazole (PRILOSEC) 40 MG capsule TAKE 1 CAPSULE BY MOUTH 2 TIMES DAILY  . pravastatin (PRAVACHOL) 40 MG tablet take 1 tablet by mouth once daily  . Probiotic Product (PROBIOTIC FORMULA PO) Take  1 tablet by mouth every evening.   . metoprolol tartrate (LOPRESSOR) 25 MG tablet Take 0.5 tablets (12.5 mg total) by mouth 2 (two) times daily.   No facility-administered encounter medications on file as of 04/03/2017.     Activities of Daily Living In your present state of health, do you have any difficulty performing the following activities: 04/03/2017  Hearing? N  Vision? N  Difficulty concentrating or making decisions? N  Walking or climbing stairs? N  Dressing or bathing? N  Doing errands, shopping? N  Preparing Food and eating ? N  Using the Toilet? N  In the past six months, have you accidently leaked urine? N  Comment pessury  Do you have problems with loss of bowel  control? N  Managing your Medications? N  Managing your Finances? N  Housekeeping or managing your Housekeeping? N  Some recent data might be hidden    Patient Care Team: [name], DO as PCP - General (Geriatric Medicine)    Assessment:     Exercise Activities and Dietary recommendations Current Exercise Habits: Home exercise routine, Type of exercise: Other - see comments;walking (water walk), Time (Minutes): 25, Frequency (Times/Week): 6, Weekly Exercise (Minutes/Week): 150, Intensity: Mild  Goals    . Increase water intake          Patient will increase intake.      Fall Risk Fall Risk  04/03/2017 09/29/2016 05/30/2016 02/15/2016 01/28/2016  Falls in the past year? No No No No No   Depression Screen PHQ 2/9 Scores 04/03/2017 09/29/2016 05/30/2016 01/28/2016  PHQ - 2 Score 0 0 0 0     Cognitive Function MMSE - Mini Mental State Exam 04/03/2017 01/28/2016  Orientation to time 5 5  Orientation to Place 5 5  Registration 3 3  Attention/ Calculation 5 4  Recall 3 3  Language- name 2 objects 2 2  Language- repeat 1 1  Language- follow 3 step command 3 3  Language- read & follow direction 1 1  Write a sentence 1 1  Copy design 1 1  Total score 30 29        Immunization History  Administered Date(s) Administered  . Influenza, Quadrivalent, Recombinant, Inj, Pf 03/25/2016  . Influenza,inj,Quad PF,6+ Mos 04/11/2013  . Influenza-Unspecified 08/09/2011, 05/30/2014  . Pneumococcal Conjugate-13 07/11/2013  . Pneumococcal Polysaccharide-23 07/25/2014  . Tdap 01/26/2015  . Zoster 08/08/2010   Screening Tests Health Maintenance  Topic Date Due  . INFLUENZA VACCINE  03/08/2017  . TETANUS/TDAP  01/25/2025  . DEXA SCAN  Completed  . PNA vac Low Risk Adult  Completed      Plan:    I have personally reviewed and addressed the Medicare Annual Wellness questionnaire and have noted the following in the patient's chart:  A. Medical and social history B. Use of alcohol,  tobacco or illicit drugs  C. Current medications and supplements D. Functional ability and status E.  Nutritional status F.  Physical activity G. Advance directives H. List of other physicians I.  Hospitalizations, surgeries, and ER visits in previous 12 months J.  [place] to include hearing, vision, cognitive, depression L. Referrals and appointments - none  In addition, I have reviewed and discussed with patient certain preventive protocols, quality metrics, and best practice recommendations. A written personalized care plan for preventive services as well as general preventive health recommendations were provided to patient.  See attached scanned questionnaire for additional information.   Signed,   Rich Reining, RN  Nurse Health Advisor   Quick Notes   Health Maintenance: flu vaccine given     Abnormal Screen: MMSE 30/30. Passed clock drawing     Patient Concerns: none     Nurse Concerns: none

## 2017-04-03 NOTE — Patient Instructions (Signed)
Rebecca Guzman , Thank you for taking time to come for your Medicare Wellness Visit. I appreciate your ongoing commitment to your health goals. Please review the following plan we discussed and let me know if I can assist you in the future.   Screening recommendations/referrals: Colonoscopy up to date, pt aged out Mammogram up to date, pt aged out Bone Density up to date Recommended yearly ophthalmology/optometry visit for glaucoma screening and checkup Recommended yearly dental visit for hygiene and checkup  Vaccinations: Influenza vaccine given. Pneumococcal vaccine up to date Tdap vaccine up to date. Due 01/25/2025 Shingles vaccine due, prescription sent to pharmacy  Advanced directives: Please bring [place] a copy when you coming in September  Conditions/risks identified: None  Next appointment: Dr Mariea Clonts 9/20 @ 1:30pm   Preventive Care 65 Years and Older, Female Preventive care refers to lifestyle choices and visits with your health care provider that can promote health and wellness. What does preventive care include?  A yearly physical exam. This is also called an annual well check.  Dental exams once or twice a year.  Routine eye exams. Ask your health care provider how often you should have your eyes checked.  Personal lifestyle choices, including:  Daily care of your teeth and gums.  Regular physical activity.  Eating a healthy diet.  Avoiding tobacco and drug use.  Limiting alcohol use.  Practicing safe sex.  Taking low-dose aspirin every day.  Taking vitamin and mineral supplements as recommended by your health care provider. What happens during an annual well check? The services and screenings done by your health care provider during your annual well check will depend on your age, overall health, lifestyle risk factors, and family history of disease. Counseling  Your health care provider may ask you questions about your:  Alcohol use.  Tobacco use.  Drug  use.  Emotional well-being.  Home and relationship well-being.  Sexual activity.  Eating habits.  History of falls.  Memory and ability to understand (cognition).  Work and work Statistician.  Reproductive health. Screening  You may have the following tests or measurements:  Height, weight, and BMI.  Blood pressure.  Lipid and cholesterol levels. These may be checked every 5 years, or more frequently if you are over 43 years old.  Skin check.  Lung cancer screening. You may have this screening every year starting at age 76 if you have a 30-pack-year history of smoking and currently smoke or have quit within the past 15 years.  Fecal occult blood test (FOBT) of the stool. You may have this test every year starting at age 59.  Flexible sigmoidoscopy or colonoscopy. You may have a sigmoidoscopy every 5 years or a colonoscopy every 10 years starting at age 59.  Hepatitis C blood test.  Hepatitis B blood test.  Sexually transmitted disease (STD) testing.  Diabetes screening. This is done by checking your blood sugar (glucose) after you have not eaten for a while (fasting). You may have this done every 1-3 years.  Bone density scan. This is done to screen for osteoporosis. You may have this done starting at age 45.  Mammogram. This may be done every 1-2 years. Talk to your health care provider about how often you should have regular mammograms. Talk with your health care provider about your test results, treatment options, and if necessary, the need for more tests. Vaccines  Your health care provider may recommend certain vaccines, such as:  Influenza vaccine. This is recommended every year.  Tetanus,  diphtheria, and acellular pertussis (Tdap, Td) vaccine. You may need a Td booster every 10 years.  Zoster vaccine. You may need this after age 81.  Pneumococcal 13-valent conjugate (PCV13) vaccine. One dose is recommended after age 69.  Pneumococcal polysaccharide  (PPSV23) vaccine. One dose is recommended after age 53. Talk to your health care provider about which screenings and vaccines you need and how often you need them. This information is not intended to replace advice given to you by your health care provider. Make sure you discuss any questions you have with your health care provider. Document Released: 08/21/2015 Document Revised: 04/13/2016 Document Reviewed: 05/26/2015 Elsevier Interactive Patient Education  2017 [place] Prevention in the Home Falls can cause injuries. They can happen to people of all ages. There are many things you can do to make your home safe and to help prevent falls. What can I do on the outside of my home?  Regularly fix the edges of walkways and driveways and fix any cracks.  Remove anything that might make you trip as you walk through a door, such as a raised step or threshold.  Trim any bushes or trees on the path to your home.  Use bright outdoor lighting.  Clear any walking paths of anything that might make someone trip, such as rocks or tools.  Regularly check to see if handrails are loose or broken. Make sure that both sides of any steps have handrails.  Any raised decks and porches should have guardrails on the edges.  Have any leaves, snow, or ice cleared regularly.  Use sand or salt on walking paths during winter.  Clean up any spills in your garage right away. This includes oil or grease spills. What can I do in the bathroom?  Use night lights.  Install grab bars by the toilet and in the tub and shower. Do not use towel bars as grab bars.  Use non-skid mats or decals in the tub or shower.  If you need to sit down in the shower, use a plastic, non-slip stool.  Keep the floor dry. Clean up any water that spills on the floor as soon as it happens.  Remove soap buildup in the tub or shower regularly.  Attach bath mats securely with double-sided non-slip rug tape.  Do not have  throw rugs and other things on the floor that can make you trip. What can I do in the bedroom?  Use night lights.  Make sure that you have a light by your bed that is easy to reach.  Do not use any sheets or blankets that are too big for your bed. They should not hang down onto the floor.  Have a firm chair that has side arms. You can use this for support while you get dressed.  Do not have throw rugs and other things on the floor that can make you trip. What can I do in the kitchen?  Clean up any spills right away.  Avoid walking on wet floors.  Keep items that you use a lot in easy-to-reach places.  If you need to reach something above you, use a strong step stool that has a grab bar.  Keep electrical cords out of the way.  Do not use floor polish or wax that makes floors slippery. If you must use wax, use non-skid floor wax.  Do not have throw rugs and other things on the floor that can make you trip. What can I do with  my stairs?  Do not leave any items on the stairs.  Make sure that there are handrails on both sides of the stairs and use them. Fix handrails that are broken or loose. Make sure that handrails are as long as the stairways.  Check any carpeting to make sure that it is firmly attached to the stairs. Fix any carpet that is loose or worn.  Avoid having throw rugs at the top or bottom of the stairs. If you do have throw rugs, attach them to the floor with carpet tape.  Make sure that you have a light switch at the top of the stairs and the bottom of the stairs. If you do not have them, ask someone to add them for you. What else can I do to help prevent falls?  Wear shoes that:  Do not have high heels.  Have rubber bottoms.  Are comfortable and fit you well.  Are closed at the toe. Do not wear sandals.  If you use a stepladder:  Make sure that it is fully opened. Do not climb a closed stepladder.  Make sure that both sides of the stepladder are  locked into place.  Ask someone to hold it for you, if possible.  Clearly mark and make sure that you can see:  Any grab bars or handrails.  First and last steps.  Where the edge of each step is.  Use tools that help you move around (mobility aids) if they are needed. These include:  Canes.  Walkers.  Scooters.  Crutches.  Turn on the lights when you go into a dark area. Replace any light bulbs as soon as they burn out.  Set up your furniture so you have a clear path. Avoid moving your furniture around.  If any of your floors are uneven, fix them.  If there are any pets around you, be aware of where they are.  Review your medicines with your doctor. Some medicines can make you feel dizzy. This can increase your chance of falling. Ask your doctor what other things that you can do to help prevent falls. This information is not intended to replace advice given to you by your health care provider. Make sure you discuss any questions you have with your health care provider. Document Released: 05/21/2009 Document Revised: 12/31/2015 Document Reviewed: 08/29/2014 Elsevier Interactive Patient Education  2017 [employer].

## 2017-04-05 ENCOUNTER — Ambulatory Visit
Admission: RE | Admit: 2017-04-05 | Discharge: 2017-04-05 | Disposition: A | Discharge status: Home / Self Care | Payer: Medicare Other | Source: Ambulatory Visit | Attending: Internal Medicine | Admitting: Internal Medicine

## 2017-04-05 DIAGNOSIS — Z1231 Encounter for screening mammogram for malignant neoplasm of breast: Secondary | ICD-10-CM

## 2017-04-05 HISTORY — DX: Personal history of antineoplastic chemotherapy: Z92.21

## 2017-04-27 ENCOUNTER — Encounter: Payer: Self-pay | Admitting: Internal Medicine

## 2017-04-27 ENCOUNTER — Ambulatory Visit (INDEPENDENT_AMBULATORY_CARE_PROVIDER_SITE_OTHER): Payer: Medicare Other | Admitting: Internal Medicine

## 2017-04-27 VITALS — BP 120/70 | HR 78 | Temp 98.3°F | Ht 65.0 in | Wt 173.0 lb

## 2017-04-27 DIAGNOSIS — M5416 Radiculopathy, lumbar region: Secondary | ICD-10-CM

## 2017-04-27 DIAGNOSIS — K219 Gastro-esophageal reflux disease without esophagitis: Secondary | ICD-10-CM

## 2017-04-27 DIAGNOSIS — R002 Palpitations: Secondary | ICD-10-CM | POA: Diagnosis not present

## 2017-04-27 DIAGNOSIS — Z7189 Other specified counseling: Secondary | ICD-10-CM

## 2017-04-27 DIAGNOSIS — E785 Hyperlipidemia, unspecified: Secondary | ICD-10-CM | POA: Diagnosis not present

## 2017-04-27 DIAGNOSIS — M810 Age-related osteoporosis without current pathological fracture: Secondary | ICD-10-CM | POA: Diagnosis not present

## 2017-04-27 DIAGNOSIS — K635 Polyp of colon: Secondary | ICD-10-CM

## 2017-04-27 DIAGNOSIS — R739 Hyperglycemia, unspecified: Secondary | ICD-10-CM

## 2017-04-27 DIAGNOSIS — Z0000 Encounter for general adult medical examination without abnormal findings: Secondary | ICD-10-CM | POA: Diagnosis not present

## 2017-04-27 DIAGNOSIS — N8110 Cystocele, unspecified: Secondary | ICD-10-CM

## 2017-04-27 DIAGNOSIS — IMO0001 Reserved for inherently not codable concepts without codable children: Secondary | ICD-10-CM

## 2017-04-27 NOTE — Progress Notes (Signed)
Provider:  Rexene Edison. Mariea Clonts, D.O., C.M.D. Location:      Place of Service:     Previous PCP: [name], DO Patient Care Team: [name], DO as PCP - General (Geriatric Medicine)  Extended Emergency Contact Information Primary Emergency Contact: [name],[name] Address: [address]          [place], [place] [address] [name] of [place] Phone: [phone] Mobile Phone: [phone] Relation: Daughter Secondary Emergency Contact: [name],[name] Address: [address]           [place], [place] [address] [name] of [place] Phone: [phone] Mobile Phone: [phone] Relation: Daughter   Code Status:  DNR  Goals of Care: Advanced Directive information Advanced Directives 04/03/2017  Does Patient Have a Medical Advance Directive? Yes  Type of Advance Directive Out of facility DNR (pink MOST or yellow form)  Does patient want to make changes to medical advance directive? No - Patient declined  Copy of [place] in Chart? -  Would patient like information on creating a medical advance directive? -  Pre-existing out of facility DNR order (yellow form or pink MOST form) Yellow form placed in chart (order not valid for inpatient use);Pink MOST form placed in chart (order not valid for inpatient use)      Chief Complaint  Patient presents with  . Medical Management of Chronic Issues    follow-up from medicare wellness visit    HPI: Patient is a 83 y.o. female seen today for an annual physical exam.  No major new events in life.    Will see Dr. Meda Coffee again in November, but doing ok with palpitations.  Medication has not completely wiped it out.    Triglycerides slightly high.  LDL 95.    Hypertension:  BP 120/70.    Has her DNR form and we discussed the MOST form and she's going to do her living will and hcpoa.  Asks about a colonoscopy.  She worries that her sister needs 2 cscopes this year   Nerve and  slipped disc are acting up.  Feels she needs another shot perhaps for her back and leg pain.  She's done very well all summer.  Last shot was February.  Bending over aggravates it, but not water walking she's doing.    On prolia for her bones.    Has very rare reflux if she eats anything acidic like a dill pickle. Takes omeprazole for a long time.  Thinks she needs to keep it.    She's going to get her new shingles shot when the flu shot is in.  She's going to go next week.  Bladder is doing better since pessary.  She's getting it cleaned every 2 mos.  Able to urinate in the daytime and rest at night.  Past Medical History:  Diagnosis Date  . Asthma   . BPPV (benign paroxysmal positional vertigo)   . GERD (gastroesophageal reflux disease)   . Hiatal hernia   . History of adenomatous polyp of colon    tubular adenoma 2005 and  2014 tubular adenoma and  hyperplastic polyp  . History of breast cancer per pt no recurrence   dx 1992 --  s/p  left breast mastectomy and chemotherapy  . Hyperlipidemia LDL goal <100   . Osteoarthritis of right knee   . Personal history of chemotherapy 1992  . PONV (postoperative nausea and vomiting)  and "gets weak and light-leaded"  . Sensation of pressure in bladder area   . Vaginal vault prolapse    anterior  . Wears glasses   . Wears hearing aid    left only   Past Surgical History:  Procedure Laterality Date  . CATARACT EXTRACTION W/ INTRAOCULAR LENS  IMPLANT, BILATERAL  2014  . CHOLECYSTECTOMY  1985  . COLONOSCOPY  last one 06-04-2013  . CYSTOCELE REPAIR N/A 02/29/2016   Procedure: ANTERIOR VAULT PROLAPSE REPAIR COLOPLAST [place] FIXATION AUGMENTED AXIS DERMIS REPAIR  ;  Surgeon: Carolan Clines, MD;  Location: [facility];  Service: Urology;  Laterality: N/A;  . ESOPHAGOGASTRODUODENOSCOPY  08-31-2009  . LAPAROSCOPIC ASSISTED VAGINAL HYSTERECTOMY  01-10-2000   w/ Bilateral Salpingoophorectomy /  Anterior & Posterior  repair/  Pubovaginal Sling  . MASTECTOMY Left 1992    reports that she quit smoking about 33 years ago. Her smoking use included Cigarettes. She quit after 13.00 years of use. She has never used smokeless tobacco. She reports that she does not drink alcohol or use drugs.  Functional Status Survey:    Family History  Problem Relation Age of Onset  . Heart attack Mother   . Colon cancer Mother   . Hypertension Mother   . Heart disease Father   . Breast cancer Sister     Health Maintenance  Topic Date Due  . TETANUS/TDAP  01/25/2025  . INFLUENZA VACCINE  Completed  . DEXA SCAN  Completed  . PNA vac Low Risk Adult  Completed    Allergies  Allergen Reactions  . Codeine Nausea And Vomiting  . Morphine And Related Other (See Comments)    Nausea, and drop in blood pressure    Outpatient Encounter Prescriptions as of 04/27/2017  Medication Sig  . acetaminophen (TYLENOL) 500 MG tablet Take 1,000 mg by mouth every morning.   Marland Kitchen albuterol (PROVENTIL HFA;VENTOLIN HFA) 108 (90 BASE) MCG/ACT inhaler Inhale 2 puffs into the lungs as needed for wheezing.  . Ca Carbonate-Mag Hydroxide (ROLAIDS PO) Take 2 tablets by mouth daily as needed (indigestion).  . Calcium Citrate-Vitamin D (CITRACAL + D PO) Take 2 tablets by mouth daily.  . fexofenadine (ALLEGRA) 180 MG tablet Take 180 mg by mouth every morning.   . fluticasone (FLONASE) 50 MCG/ACT nasal spray Place 1 spray into both nostrils every evening.   . metoprolol tartrate (LOPRESSOR) 25 MG tablet Take 0.5 tablets (12.5 mg total) by mouth 2 (two) times daily.  . Multiple Vitamin (MULTIVITAMIN) tablet Take 1 tablet by mouth daily.  Marland Kitchen omeprazole (PRILOSEC) 40 MG capsule TAKE 1 CAPSULE BY MOUTH 2 TIMES DAILY  . pravastatin (PRAVACHOL) 40 MG tablet take 1 tablet by mouth once daily  . Probiotic Product (PROBIOTIC FORMULA PO) Take 1 tablet by mouth every evening.   . [DISCONTINUED] metoprolol tartrate (LOPRESSOR) 25 MG tablet Take 12.5 mg by mouth  2 (two) times daily.   No facility-administered encounter medications on file as of 04/27/2017.     Review of Systems  Constitutional: Negative for chills and fever.  HENT: Positive for hearing loss. Negative for congestion.        Left hearing aid  Eyes: Negative for blurred vision.  Respiratory: Negative for cough and shortness of breath.   Cardiovascular: Positive for palpitations. Negative for chest pain and leg swelling.       Not as much as usual  Gastrointestinal: Negative for abdominal pain, blood in stool, constipation, diarrhea and melena.  Genitourinary: Negative for dysuria.  Musculoskeletal: Negative for falls.  Skin: Negative for itching and rash.  Neurological: Negative for dizziness and loss of consciousness.  Endo/Heme/Allergies: Does not bruise/bleed easily.  Psychiatric/Behavioral: Negative for depression and memory loss. The patient is not nervous/anxious and does not have insomnia.     Vitals:   04/27/17 1330  BP: 120/70  Pulse: 78  Temp: 98.3 F (36.8 C)  TempSrc: Oral  SpO2: 97%  Weight: 173 lb (78.5 kg)  Height: 5\' 5"  (1.651 m)   Body mass index is 28.79 kg/m. Physical Exam  Constitutional: She is oriented to person, place, and time. She appears well-developed and well-nourished.  HENT:  Head: Normocephalic and atraumatic.  Right Ear: External ear normal.  Left Ear: External ear normal.  Nose: Nose normal.  Mouth/Throat: Oropharynx is clear and moist. No oropharyngeal exudate.  Right ear canal with small amt of cerumen, left hearing aid, pink tm  Eyes: Pupils are equal, round, and reactive to light. Conjunctivae and EOM are normal.  Neck: Normal range of motion. Neck supple. No JVD present.  Cardiovascular: Normal rate, regular rhythm, normal heart sounds and intact distal pulses.   Pulmonary/Chest: Effort normal and breath sounds normal. No respiratory distress.  Abdominal: Soft. Bowel sounds are normal. She exhibits no distension. There is no  tenderness.  Musculoskeletal: Normal range of motion. She exhibits no tenderness.  Lymphadenopathy:    She has no cervical adenopathy.  Neurological: She is alert and oriented to person, place, and time.  Skin: Skin is warm and dry. Capillary refill takes less than 2 seconds.  Psychiatric: She has a normal mood and affect. Her behavior is normal. Judgment and thought content normal.    Labs reviewed: Basic Metabolic Panel:  Recent Labs  05/30/16 1105 10/31/16 1111 02/13/17 0832  NA 140 142 140  K 4.2 4.1 4.5  CL 107 104 105  CO2 26 26 22   GLUCOSE 96 81 97  BUN 14 15 17   CREATININE 0.82 0.75 0.84  CALCIUM 9.2 9.3 9.2   Liver Function Tests:  Recent Labs  05/30/16 1105 02/13/17 0832  AST 20 16  ALT 14 13  ALKPHOS 54 54  BILITOT 0.5 0.5  PROT 6.5 6.7  ALBUMIN 4.0 4.2   No results for input(s): LIPASE, AMYLASE in the last 8760 hours. No results for input(s): AMMONIA in the last 8760 hours. CBC:  Recent Labs  05/30/16 1105 02/13/17 0832  WBC 6.2 6.4  NEUTROABS 3,410 3,840  HGB 13.1 13.6  HCT 39.7 41.5  MCV 89.8 93.9  PLT 230 218   Cardiac Enzymes: No results for input(s): CKTOTAL, CKMB, CKMBINDEX, TROPONINI in the last 8760 hours. BNP: Invalid input(s): POCBNP Lab Results  Component Value Date   HGBA1C 5.4 02/13/2017   Lab Results  Component Value Date   TSH 2.820 10/31/2016   Lab Results  Component Value Date   LGXQJJHE17 408 01/15/2014   Lab Results  Component Value Date   FOLATE >19.9 01/15/2014   Assessment/Plan 1. Annual physical exam - performed today  - CBC with Differential/Platelet; Future - COMPLETE METABOLIC PANEL WITH GFR; Future - Lipid panel; Future - Hemoglobin A1c; Future - Do not attempt resuscitation (DNR)  2. Senile osteoporosis -cont prolia injections, water walking, citracal with D   3. Bladder prolapse, female, acquired -doing better with pessary now and wishes she'd just done that to begin with  4.  Palpitations -fewer episodes on metoprolol tartrate  5. Radicular pain of right lower back -advised it's ok to go  ahead and get a repeat injection which helped her quite a bit in February  6. Hyperlipidemia LDL goal <100 - cont pravachol therapy, has been at goal - Lipid panel; Future - Do not attempt resuscitation (DNR)  7. Hyperglycemia -normalized with water aerobics - COMPLETE METABOLIC PANEL WITH GFR; Future - Hemoglobin A1c; Future - Do not attempt resuscitation (DNR)  8. Gastroesophageal reflux disease without esophagitis -cont omeprazole due to some persistent symptoms even while on it at times - Do not attempt resuscitation (DNR)  9. Polyp of colon, unspecified part of colon, unspecified type - she would like to continue screening due to her sister having several premalignant polyps and her family history of colon ca--will be due later this year--Dr. Fuller Plan does her cscopes - CBC with Differential/Platelet; Future - Do not attempt resuscitation (DNR)  10. Advance care planning -spent 30 minutes discussing DNR status, MOST form, liivng will and hcpoa forms with her -she is doing to get her living will and hcpoa done and I'm going to fill in her MOST next visit after she thinks about it - Do not attempt resuscitation (DNR)  Labs/tests ordered:   Orders Placed This Encounter  Procedures  . CBC with Differential/Platelet    Standing Status:   Future    Standing Expiration Date:   04/27/2018  . COMPLETE METABOLIC PANEL WITH GFR    Standing Status:   Future    Standing Expiration Date:   04/27/2018  . Lipid panel    Standing Status:   Future    Standing Expiration Date:   04/27/2018  . Hemoglobin A1c    Standing Status:   Future    Standing Expiration Date:   04/27/2018  . Do not attempt resuscitation (DNR)    Discussed at clinic visit, scanned copy should be in documents and media    Order Specific Question:   In the event of cardiac or respiratory ARREST    Answer:    Do not call a "code blue"    Order Specific Question:   In the event of cardiac or respiratory ARREST    Answer:   Do not perform Intubation, CPR, defibrillation or ACLS    Order Specific Question:   In the event of cardiac or respiratory ARREST    Answer:   Use medication by any route, position, wound care, and other measures to relive pain and suffering. May use oxygen, suction and manual treatment of airway obstruction as needed for comfort.    Tangie Stay L. Ruthann Angulo, D.O. [facility] [address], [place] [address] Cell Phone (Mon-Fri 8am-5pm):  [phone] On Call:  [phone] & follow prompts after 5pm & weekends Office Phone:  [phone] Office Fax:  [phone]

## 2017-06-22 ENCOUNTER — Ambulatory Visit (INDEPENDENT_AMBULATORY_CARE_PROVIDER_SITE_OTHER): Payer: Medicare Other

## 2017-06-22 DIAGNOSIS — M810 Age-related osteoporosis without current pathological fracture: Secondary | ICD-10-CM | POA: Diagnosis not present

## 2017-06-22 MED ORDER — DENOSUMAB 60 MG/ML ~~LOC~~ SOLN
60.0000 mg | Freq: Once | SUBCUTANEOUS | Status: AC
  Administered 2017-06-22: 60 mg via SUBCUTANEOUS

## 2017-08-02 DIAGNOSIS — Z9889 Other specified postprocedural states: Secondary | ICD-10-CM

## 2017-08-02 DIAGNOSIS — R112 Nausea with vomiting, unspecified: Secondary | ICD-10-CM | POA: Insufficient documentation

## 2017-08-02 DIAGNOSIS — Z86010 Personal history of colonic polyps: Secondary | ICD-10-CM | POA: Insufficient documentation

## 2017-08-02 DIAGNOSIS — Z860101 Personal history of adenomatous and serrated colon polyps: Secondary | ICD-10-CM | POA: Insufficient documentation

## 2017-08-02 DIAGNOSIS — Z853 Personal history of malignant neoplasm of breast: Secondary | ICD-10-CM | POA: Insufficient documentation

## 2017-08-02 DIAGNOSIS — Z974 Presence of external hearing-aid: Secondary | ICD-10-CM | POA: Insufficient documentation

## 2017-08-02 DIAGNOSIS — N819 Female genital prolapse, unspecified: Secondary | ICD-10-CM | POA: Insufficient documentation

## 2017-08-02 DIAGNOSIS — K449 Diaphragmatic hernia without obstruction or gangrene: Secondary | ICD-10-CM | POA: Insufficient documentation

## 2017-08-02 DIAGNOSIS — H8110 Benign paroxysmal vertigo, unspecified ear: Secondary | ICD-10-CM | POA: Insufficient documentation

## 2017-08-02 DIAGNOSIS — R3989 Other symptoms and signs involving the genitourinary system: Secondary | ICD-10-CM | POA: Insufficient documentation

## 2017-08-02 DIAGNOSIS — Z973 Presence of spectacles and contact lenses: Secondary | ICD-10-CM | POA: Insufficient documentation

## 2017-08-06 ENCOUNTER — Other Ambulatory Visit: Payer: Self-pay | Admitting: Internal Medicine

## 2017-08-08 DIAGNOSIS — I499 Cardiac arrhythmia, unspecified: Secondary | ICD-10-CM

## 2017-08-08 HISTORY — DX: Cardiac arrhythmia, unspecified: I49.9

## 2017-08-18 ENCOUNTER — Ambulatory Visit: Payer: Medicare Other | Admitting: Cardiology

## 2017-08-18 ENCOUNTER — Encounter: Payer: Self-pay | Admitting: Cardiology

## 2017-08-18 VITALS — BP 114/64 | HR 76 | Ht 65.0 in | Wt 174.8 lb

## 2017-08-18 DIAGNOSIS — E785 Hyperlipidemia, unspecified: Secondary | ICD-10-CM

## 2017-08-18 DIAGNOSIS — I491 Atrial premature depolarization: Secondary | ICD-10-CM

## 2017-08-18 DIAGNOSIS — I4719 Other supraventricular tachycardia: Secondary | ICD-10-CM

## 2017-08-18 DIAGNOSIS — R002 Palpitations: Secondary | ICD-10-CM

## 2017-08-18 DIAGNOSIS — Z8249 Family history of ischemic heart disease and other diseases of the circulatory system: Secondary | ICD-10-CM | POA: Diagnosis not present

## 2017-08-18 DIAGNOSIS — I471 Supraventricular tachycardia: Secondary | ICD-10-CM

## 2017-08-18 MED ORDER — FISH OIL 1000 MG PO CAPS: 1000.0000 mg | ORAL_CAPSULE | Freq: Every day | ORAL | 0 refills | Status: AC

## 2017-08-18 NOTE — Patient Instructions (Signed)
Medication Instructions:   START TAKING FISH OIL OTC 1,000 MG BY MOUTH DAILY     Follow-Up:  Your physician wants you to follow-up in: [place] will receive a reminder letter in the mail two months in advance. If you don't receive a letter, please call our office to schedule the follow-up appointment.        If you need a refill on your cardiac medications before your next appointment, please call your pharmacy.

## 2017-08-18 NOTE — Progress Notes (Addendum)
Cardiology Office Note    Date:  08/18/2017   ID:  Rebecca Guzman, DOB [birth year], MRN [id number]  PCP:  [name], DO  Cardiologist:  Ena Dawley, MD   Referring physician: [name], DO   History of Present Illness:  Rebecca Guzman is a 84 y.o. female who is a very pleasant patient coming with concern of palpitations. She states that she has been experiencing palpitations with increased frequency currently every day especially at rest and at night. They would last a few seconds and are not associated with shortness of breath chest pain dizziness or syncope other very uncomfortable and she is concerned. They usually happen on days when she exerts herself. The patient exercises frequently as part of Silver sneakers program and doesn't experience any chest pain or shortness of breath. She has a very remote history of smoking when she was young. Her family history includes early coronary artery disease in her father died at age of 27 secondary to heart attack. The patient denies any lower extremity edema, no claudication no orthopnea or proximal nocturnal dyspnea.  12/29/2016 - patient underwent Holter monitoring that showed frequent PACs (700 in 34 hrs) and few very short runs of atrial tachycardia with the longest lasting 4 beats. She was started on metoprolol 12.5 mg by mouth twice a day with significant improvement of symptoms. She also had an echocardiogram performed that showed LVEF 60-65% and grade 1 diastolic dysfunction, trivial MR, normal left atrial size and mild TR.  08/18/2017 - 6 months follow up, stable palpitations that'll cure approximately once a week and last few seconds, they're not associated with dizziness chest pain or falls. She remains very active, she walks daily in her neighborhood and does water aerobic and [employer] with no chest pain shortness of breath. She also denies claudications no lower extremity edema orthopnea or proximal nocturnal dyspnea. She  tolerates pravastatin well.  Past Medical History:  Diagnosis Date  . Asthma   . BPPV (benign paroxysmal positional vertigo)   . GERD (gastroesophageal reflux disease)   . Hiatal hernia   . History of adenomatous polyp of colon    tubular adenoma 2005 and  2014 tubular adenoma and  hyperplastic polyp  . History of breast cancer per pt no recurrence   dx 1992 --  s/p  left breast mastectomy and chemotherapy  . Hyperlipidemia LDL goal <100   . Osteoarthritis of right knee   . Personal history of chemotherapy 1992  . PONV (postoperative nausea and vomiting)    and "gets weak and light-leaded"  . Sensation of pressure in bladder area   . Vaginal vault prolapse    anterior  . Wears glasses   . Wears hearing aid    left only    Past Surgical History:  Procedure Laterality Date  . CATARACT EXTRACTION W/ INTRAOCULAR LENS  IMPLANT, BILATERAL  2014  . CHOLECYSTECTOMY  1985  . COLONOSCOPY  last one 06-04-2013  . CYSTOCELE REPAIR N/A 02/29/2016   Procedure: ANTERIOR VAULT PROLAPSE REPAIR COLOPLAST [place] FIXATION AUGMENTED AXIS DERMIS REPAIR  ;  Surgeon: Carolan Clines, MD;  Location: [facility];  Service: Urology;  Laterality: N/A;  . ESOPHAGOGASTRODUODENOSCOPY  08-31-2009  . LAPAROSCOPIC ASSISTED VAGINAL HYSTERECTOMY  01-10-2000   w/ Bilateral Salpingoophorectomy /  Anterior & Posterior repair/  Pubovaginal Sling  . MASTECTOMY Left 1992    Current Medications: Outpatient Medications Prior to Visit  Medication Sig Dispense Refill  . acetaminophen (TYLENOL) 500 MG  tablet Take 1,000 mg by mouth every morning.     Marland Kitchen albuterol (PROVENTIL HFA;VENTOLIN HFA) 108 (90 BASE) MCG/ACT inhaler Inhale 2 puffs into the lungs as directed.     . budesonide (RHINOCORT AQUA) 32 MCG/ACT nasal spray Place 1 spray into both nostrils as directed.  0  . Ca Carbonate-Mag Hydroxide (ROLAIDS PO) Take 2 tablets by mouth daily as needed (indigestion).    . Calcium Citrate-Vitamin D  (CITRACAL + D PO) Take 2 tablets by mouth daily.    . fexofenadine (ALLEGRA) 180 MG tablet Take 180 mg by mouth every morning.     . fluticasone (FLONASE) 50 MCG/ACT nasal spray Place 1 spray into both nostrils every evening.     . metoprolol tartrate (LOPRESSOR) 25 MG tablet Take 0.5 tablets (12.5 mg total) by mouth 2 (two) times daily. 180 tablet 2  . Multiple Vitamin (MULTIVITAMIN) tablet Take 1 tablet by mouth daily.    Marland Kitchen omeprazole (PRILOSEC) 40 MG capsule TAKE 1 CAPSULE BY MOUTH 2 TIMES DAILY 60 capsule 5  . pravastatin (PRAVACHOL) 40 MG tablet take 1 tablet by mouth once daily 90 tablet 0  . Probiotic Product (PROBIOTIC FORMULA PO) Take 1 tablet by mouth every evening.      No facility-administered medications prior to visit.      Allergies:   Codeine and Morphine and related   Social History   Socioeconomic History  . Marital status: Widowed    Spouse name: None  . Number of children: 4  . Years of education: None  . Highest education level: None  Social Needs  . Financial resource strain: None  . Food insecurity - worry: None  . Food insecurity - inability: None  . Transportation needs - medical: None  . Transportation needs - non-medical: None  Occupational History  . Occupation: Retired Product manager: RETIRED  Tobacco Use  . Smoking status: Former Smoker    Years: 13.00    Types: Cigarettes    Last attempt to quit: 10/12/1983    Years since quitting: 33.8  . Smokeless tobacco: Never Used  Substance and Sexual Activity  . Alcohol use: No  . Drug use: No  . Sexual activity: None  Other Topics Concern  . None  Social History Narrative  . None     Family History:  The patient's family history includes Breast cancer in her sister; Colon cancer in her mother; Heart attack in her mother; Heart disease in her father; Hypertension in her mother.   ROS:   Please see the history of present illness.    ROS All other systems reviewed and are negative.  PHYSICAL  EXAM:   VS:  BP 114/64   Pulse 76   Ht 5\' 5"  (1.651 m)   Wt 174 lb 12.8 oz (79.3 kg)   LMP 11/21/1985   BMI 29.09 kg/m    GEN: Well nourished, well developed, in no acute distress  HEENT: normal  Neck: no JVD, carotid bruits, or masses Cardiac: RRR; no murmurs, rubs, or gallops,no edema, Poor pulses in the left lower extremity.  Respiratory:  clear to auscultation bilaterally, normal work of breathing GI: soft, nontender, nondistended, + BS MS: no deformity or atrophy  Skin: warm and dry, no rash Neuro:  Alert and Oriented x 3, Strength and sensation are intact Psych: euthymic mood, full affect  Wt Readings from Last 3 Encounters:  08/18/17 174 lb 12.8 oz (79.3 kg)  04/27/17 173 lb (78.5 kg)  04/03/17  173 lb (78.5 kg)    Studies/Labs Reviewed:   EKG:  EKG is ordered today.  The ekg ordered today demonstrates Normal sinus rhythm, normal EKG. Personally reviewed.  Recent Labs: 10/31/2016: TSH 2.820 02/13/2017: ALT 13; BUN 17; Creat 0.84; Hemoglobin 13.6; Platelets 218; Potassium 4.5; Sodium 140   Lipid Panel    Component Value Date/Time   CHOL 186 02/13/2017 0832   CHOL 187 01/26/2016 0802   TRIG 166 (H) 02/13/2017 0832   HDL 58 02/13/2017 0832   HDL 59 01/26/2016 0802   CHOLHDL 3.2 02/13/2017 0832   VLDL 33 (H) 02/13/2017 0832   LDLCALC 95 02/13/2017 0832   LDLCALC 102 (H) 01/26/2016 0802    Additional studies/ records that were reviewed today include:   TTE: 10/2016 - Left ventricle: The cavity size was normal. Wall thickness was   increased in a pattern of mild LVH. Systolic function was normal.   The estimated ejection fraction was in the range of 60% to 65%.   Wall motion was normal; there were no regional wall motion   abnormalities. Doppler parameters are consistent with abnormal   left ventricular relaxation (grade 1 diastolic dysfunction). The   E/e&' ratio is >15, suggesting elevated LV filling pressure. - Mitral valve: Mildly thickened and sclerotic  anterior leaflet.   There was trivial regurgitation. - Left atrium: The atrium was normal in size. - Tricuspid valve: There was mild regurgitation. - Pulmonary arteries: PA peak pressure: 37 mm Hg (S). - Inferior vena cava: The vessel was normal in size. The   respirophasic diameter changes were in the normal range (>= 50%),   consistent with normal central venous pressure.  Impressions:  - LVEF 60-65%, mild LVH, normal wall motion, grade 1 DD with   elevated LV filling pressure, trivial MR, normal LA size, mild   TR, RVSP 37 mmHg, normal IVC.  EKG performed today 08/18/2017 shows normal sinus rhythm with sinus arrhythmia otherwise normal EKG unchanged from prior. This was personally reviewed.    ASSESSMENT:    1. Palpitations   2. Hyperlipidemia LDL goal <100   3. Family history of early CAD   32. PAC (premature atrial contraction)   5. Atrial tachycardia ([place])    PLAN:  In order of problems listed above:  1. Palpitations, Normal echocardiogram, normal left atrial size, normal labs including electrolytes and TSH. Holter monitor showed 700 PACs in 36 hours and few very short runs of atrial tachycardia, significant symptoms improvement with metoprolol 12.5 mg by mouth twice a day, though continue. She has no associated symptoms. 2. Blood pressure is well controlled 3. Hyperlipidemia followed by her primary care physician, she is on pravastatin and she is tolerating it well. Her LDL and HDL are cold triglycerides 166, she is advised to start using 1000 mg of over-the-counter fish oil daily. 4. She has poor peripheral pulses in the left lower extremity but no claudications with regular walking , for now we will not perform lower extremity arterial ultrasound.  Medication Adjustments/Labs and Tests Ordered: Current medicines are reviewed at length with the patient today.  Concerns regarding medicines are outlined above.  Medication changes, Labs and Tests ordered today are listed in  the Patient Instructions below. Patient Instructions  Medication Instructions:   START TAKING FISH OIL OTC 1,000 MG BY MOUTH DAILY     Follow-Up:  Your physician wants you to follow-up in: [place] will receive a reminder letter in the mail two months in  advance. If you don't receive a letter, please call our office to schedule the follow-up appointment.        If you need a refill on your cardiac medications before your next appointment, please call your pharmacy.      Signed, Ena Dawley, MD  08/18/2017 11:26 AM    [address], [place], [place]  [address] Phone: [phone]; Fax: [phone]

## 2017-09-06 ENCOUNTER — Telehealth (INDEPENDENT_AMBULATORY_CARE_PROVIDER_SITE_OTHER): Payer: Self-pay | Admitting: Orthopaedic Surgery

## 2017-09-06 DIAGNOSIS — M5416 Radiculopathy, lumbar region: Secondary | ICD-10-CM

## 2017-09-06 NOTE — Telephone Encounter (Signed)
Please advise. OK for order or should patient make ROV to discuss?

## 2017-09-06 NOTE — Telephone Encounter (Signed)
Ok thanks 

## 2017-09-06 NOTE — Telephone Encounter (Signed)
Patient called asked if she can schedule another Epidural injection with Dr Ernestina Patches? The number to contact patient is [phone]

## 2017-09-06 NOTE — Telephone Encounter (Signed)
Order entered. Patient advised.  

## 2017-09-25 ENCOUNTER — Encounter (INDEPENDENT_AMBULATORY_CARE_PROVIDER_SITE_OTHER): Payer: Self-pay | Admitting: Physical Medicine and Rehabilitation

## 2017-09-25 ENCOUNTER — Ambulatory Visit (INDEPENDENT_AMBULATORY_CARE_PROVIDER_SITE_OTHER): Payer: Medicare Other | Admitting: Physical Medicine and Rehabilitation

## 2017-09-25 ENCOUNTER — Ambulatory Visit (INDEPENDENT_AMBULATORY_CARE_PROVIDER_SITE_OTHER): Payer: Medicare Other

## 2017-09-25 VITALS — BP 131/75 | HR 76 | Temp 97.8°F

## 2017-09-25 DIAGNOSIS — M4316 Spondylolisthesis, lumbar region: Secondary | ICD-10-CM

## 2017-09-25 DIAGNOSIS — M419 Scoliosis, unspecified: Secondary | ICD-10-CM

## 2017-09-25 DIAGNOSIS — M48062 Spinal stenosis, lumbar region with neurogenic claudication: Secondary | ICD-10-CM

## 2017-09-25 DIAGNOSIS — M5416 Radiculopathy, lumbar region: Secondary | ICD-10-CM

## 2017-09-25 MED ORDER — METHYLPREDNISOLONE ACETATE 80 MG/ML IJ SUSP
80.0000 mg | Freq: Once | INTRAMUSCULAR | Status: AC
  Administered 2017-09-25: 80 mg

## 2017-09-25 NOTE — Patient Instructions (Signed)

## 2017-09-25 NOTE — Progress Notes (Deleted)
Pt states an aching pain in right hip. Pt states pain does not go away it only gets worse and has been worse since January 2019. Pt states last injection 09/14/16 helped, but the pain is still dull. Pt states bending over, carrying groceries, and vacuuming makes pain worse, tylenol helps pain. +Driver, -BT, Dye Allergies. Vasovagal.1

## 2017-09-26 ENCOUNTER — Encounter (INDEPENDENT_AMBULATORY_CARE_PROVIDER_SITE_OTHER): Payer: Self-pay | Admitting: Physical Medicine and Rehabilitation

## 2017-09-26 NOTE — Procedures (Signed)
Lumbosacral Transforaminal Epidural Steroid Injection - Sub-Pedicular Approach with Fluoroscopic Guidance  Patient: Rebecca Guzman      Date of Birth: [birth year] MRN: [id number] PCP: [name], DO      Visit Date: 09/25/2017   Universal Protocol:    Date/Time: 09/25/2017  Consent Given By: the patient  Position: PRONE  Additional Comments: Vital signs were monitored before and after the procedure. Patient was prepped and draped in the usual sterile fashion. The correct patient, procedure, and site was verified.   Injection Procedure Details:  Procedure Site One Meds Administered:  Meds ordered this encounter  Medications  . methylPREDNISolone acetate (DEPO-MEDROL) injection 80 mg    Laterality: Right  Location/Site:  L3-L4  Needle size: 22 G  Needle type: Spinal  Needle Placement: Transforaminal  Findings:    -Comments: Excellent flow of contrast along the nerve and into the epidural space.  Procedure Details: After squaring off the end-plates to get a true AP view, the C-arm was positioned so that an oblique view of the foramen as noted above was visualized. The target area is just inferior to the "nose of the scotty dog" or sub pedicular. The soft tissues overlying this structure were infiltrated with 2-3 ml. of 1% Lidocaine without Epinephrine.  The spinal needle was inserted toward the target using a "trajectory" view along the fluoroscope beam.  Under AP and lateral visualization, the needle was advanced so it did not puncture dura and was located close the 6 O'Clock position of the pedical in AP tracterory. Biplanar projections were used to confirm position. Aspiration was confirmed to be negative for CSF and/or blood. A 1-2 ml. volume of Isovue-250 was injected and flow of contrast was noted at each level. Radiographs were obtained for documentation purposes.   After attaining the desired flow of contrast documented above, a 0.5 to 1.0 ml test dose of  0.25% Marcaine was injected into each respective transforaminal space.  The patient was observed for 90 seconds post injection.  After no sensory deficits were reported, and normal lower extremity motor function was noted,   the above injectate was administered so that equal amounts of the injectate were placed at each foramen (level) into the transforaminal epidural space.   Additional Comments:  No complications occurred.  Procedure was over the patient did experience some vasovagal symptoms in the covering area.  She maintained good blood pressure.  With relaxation and breathing techniques she did recover and was discharged in good condition. Dressing: Band-Aid    Post-procedure details: Patient was observed during the procedure. Post-procedure instructions were reviewed.  Patient left the clinic in stable condition.

## 2017-09-26 NOTE — Progress Notes (Signed)
Rebecca Guzman - 84 y.o. female MRN [id number]  Date of birth: Oct 12, 1933  Office Visit Note: Visit Date: 09/25/2017 PCP: [name], DO Referred by: [name], DO  Subjective: Chief Complaint  Patient presents with  . Right Hip - Pain   HPI: [name] is a very pleasant 84 year old female normally followed by Dr. Lorin Mercy but who we saw in February 2018 and completed a right L3 transforaminal epidural steroid injection with good relief of her complaints at the time which was more right-sided hip and thigh pain.  She reports in January of this year the pain really returned to quite a bit of severity.  She does not endorse any new symptoms otherwise are no trauma just the symptoms were starting to get worse in January and have progressively worsened.  She reports it is a dull pain most of the time but with any sort of bending over or carrying groceries or standing and vacuuming makes his symptoms quite a bit worse.  She has a history of significant scoliosis particularly at the L3 level right-sided foraminal stenosis and lateral recess stenosis.  She also has facet arthropathy at L4-5 with listhesis at this level with moderate stenosis.  MRIs were.  She is getting some pain in the hip down into the thigh more of an L4 distribution.  She has no pain on the left side.  She has had no strength loss or other concerning symptoms.  Health is been good otherwise.  She does get relief with change of position and medication.  It really does affect her activities of daily living.    Review of Systems  Constitutional: Negative for chills, fever, malaise/fatigue and weight loss.  HENT: Negative for hearing loss and sinus pain.   Eyes: Negative for blurred vision, double vision and photophobia.  Respiratory: Negative for cough and shortness of breath.   Cardiovascular: Negative for chest pain, palpitations and leg swelling.  Gastrointestinal: Negative for abdominal pain, nausea and vomiting.    Genitourinary: Negative for flank pain.  Musculoskeletal: Positive for back pain and joint pain. Negative for myalgias.  Skin: Negative for itching and rash.  Neurological: Negative for tremors, focal weakness and weakness.  Endo/Heme/Allergies: Negative.   Psychiatric/Behavioral: Negative for depression.  All other systems reviewed and are negative.  Otherwise per HPI.  Assessment & Plan: Visit Diagnoses:  1. Lumbar radiculopathy   2. Spinal stenosis of lumbar region with neurogenic claudication   3. Spondylolisthesis of lumbar region   4. Scoliosis of lumbar spine, unspecified scoliosis type     Plan: Findings:  Complicated back pain history with history of scoliosis and facet arthropathy and stenosis.  She also has grade 1 listhesis of L4 on L5.  I think her pain is multifactorial as well.  She does not have any concerning symptoms for actually joint pain of the hips.  But I do think most of her pain is probably coming from the stenosis at L4-5 with her foraminal stenosis on the right at L3.  Because she did well with the prior injection at L3 I think it would be wise today to repeat that given the amount of symptoms that she is having and progressive worsening.  Depending on relief would look at regrouping with a physical therapist and/or looking at possibly facet joint blocks at L4-5 or epidural injection at L4-5.  We had a fairly long discussion with the patient today about her back.  She how she can follow-up with Dr. Lorin Mercy  as well.  She is already heard him say that she is not a great surgical candidate because of the listhesis and scoliosis.    Meds & Orders:  Meds ordered this encounter  Medications  . methylPREDNISolone acetate (DEPO-MEDROL) injection 80 mg    Orders Placed This Encounter  Procedures  . XR C-ARM NO REPORT  . Epidural Steroid injection    Follow-up: Return if symptoms worsen or fail to improve.   Procedures: No procedures performed  Lumbosacral  Transforaminal Epidural Steroid Injection - Sub-Pedicular Approach with Fluoroscopic Guidance  Patient: Rebecca Guzman      Date of Birth: Nov 07, 1933 MRN: [id number] PCP: [name], DO      Visit Date: 09/25/2017   Universal Protocol:    Date/Time: 09/25/2017  Consent Given By: the patient  Position: PRONE  Additional Comments: Vital signs were monitored before and after the procedure. Patient was prepped and draped in the usual sterile fashion. The correct patient, procedure, and site was verified.   Injection Procedure Details:  Procedure Site One Meds Administered:  Meds ordered this encounter  Medications  . methylPREDNISolone acetate (DEPO-MEDROL) injection 80 mg    Laterality: Right  Location/Site:  L3-L4  Needle size: 22 G  Needle type: Spinal  Needle Placement: Transforaminal  Findings:    -Comments: Excellent flow of contrast along the nerve and into the epidural space.  Procedure Details: After squaring off the end-plates to get a true AP view, the C-arm was positioned so that an oblique view of the foramen as noted above was visualized. The target area is just inferior to the "nose of the scotty dog" or sub pedicular. The soft tissues overlying this structure were infiltrated with 2-3 ml. of 1% Lidocaine without Epinephrine.  The spinal needle was inserted toward the target using a "trajectory" view along the fluoroscope beam.  Under AP and lateral visualization, the needle was advanced so it did not puncture dura and was located close the 6 O'Clock position of the pedical in AP tracterory. Biplanar projections were used to confirm position. Aspiration was confirmed to be negative for CSF and/or blood. A 1-2 ml. volume of Isovue-250 was injected and flow of contrast was noted at each level. Radiographs were obtained for documentation purposes.   After attaining the desired flow of contrast documented above, a 0.5 to 1.0 ml test dose of 0.25% Marcaine  was injected into each respective transforaminal space.  The patient was observed for 90 seconds post injection.  After no sensory deficits were reported, and normal lower extremity motor function was noted,   the above injectate was administered so that equal amounts of the injectate were placed at each foramen (level) into the transforaminal epidural space.   Additional Comments:  No complications occurred.  Procedure was over the patient did experience some vasovagal symptoms in the covering area.  She maintained good blood pressure.  With relaxation and breathing techniques she did recover and was discharged in good condition. Dressing: Band-Aid    Post-procedure details: Patient was observed during the procedure. Post-procedure instructions were reviewed.  Patient left the clinic in stable condition.    Clinical History: MRI LUMBAR SPINE WITHOUT CONTRAST  TECHNIQUE: Multiplanar, multisequence MR imaging of the lumbar spine was performed. No intravenous contrast was administered.  COMPARISON:  Lumbar spine radiographs 04/06/2015  FINDINGS: Moderate lumbar levoscoliosis is again seen with apex at L2-3. There is trace retrolisthesis of L1 on L2 and L2 on L3 and trace anterolisthesis of L4 on  L5. There is at most minimal right-sided vertebral body height loss at L3 which is likely related to the scoliosis. No frank compression fracture or marrow edema suggestive of recent osseous injury is identified.  There is minimal right-sided degenerative endplate edema at Z3-2 and L3-4 associated with asymmetric right-sided disc space height loss at these levels. There is also mild-to-moderate disc space narrowing at L1-2 and L5-S1. Disc desiccation is present throughout the lumbar spine.  The conus medullaris is normal in signal and terminates at L1. There is slight fullness of the renal collecting systems and ureters bilaterally, incompletely evaluated but may be physiologic  given partially visualized mild bladder distention.  L1-2: Mild disc bulging and endplate spurring result in minimal right lateral recess narrowing without spinal canal or neural foraminal stenosis.  L2-3: Mild disc bulging, endplate spurring, and asymmetric right facet arthrosis result in minimal right lateral recess and minimal right neural foraminal stenosis without spinal stenosis.  L3-4: Disc bulging asymmetric to the right and moderate right and mild left facet arthrosis result in moderate right and mild left lateral recess stenosis and moderate to severe right neural foraminal stenosis without spinal stenosis. There is the potential for the right-sided L3 and/or L4 nerve root impingement.  L4-5: Listhesis with disc uncovering and advanced left greater than right facet arthrosis result in moderate spinal stenosis, mild right and moderate left lateral recess stenosis, and at most minimal bilateral neural foraminal stenosis.  L5-S1: Disc bulging, small left paracentral disc extrusion with mild inferior migration in the lateral recess, and mild right and severe left facet arthrosis result in mild right and moderate to severe left lateral recess stenosis and mild left neural foraminal stenosis without spinal stenosis.  IMPRESSION: 1. Advanced L4-5 facet arthrosis with grade 1 anterolisthesis, moderate spinal stenosis, and left greater than right lateral recess stenosis. 2. Moderate right lateral recess stenosis and moderate to severe right neural foraminal stenosis at L3-4. 3. Moderate lumbar levoscoliosis.   Electronically Signed   By: Logan Bores M.D.   On: 10/15/2015 08:42  She reports that she quit smoking about 33 years ago. Her smoking use included cigarettes. She quit after 13.00 years of use. she has never used smokeless tobacco.  Recent Labs    02/13/17 0832  HGBA1C 5.4    Objective:  VS:  HT:    WT:   BMI:     BP:131/75  HR:76bpm  TEMP:97.8 F  (36.6 C)(Oral)  RESP:98 % Physical Exam  Constitutional: She is oriented to person, place, and time. She appears well-developed and well-nourished.  Eyes: Conjunctivae and EOM are normal. Pupils are equal, round, and reactive to light.  Cardiovascular: Normal rate and intact distal pulses.  Pulmonary/Chest: Effort normal.  Musculoskeletal:  Patient ambulates without aid but with somewhat of an antalgic gait to the right.  Examination of the lumbar spine shows slight rightward curvature.  Stands with a forward flexed lumbar spine.  She has concordant pain with extension rotation of the lumbar spine.  She has mild pain over the greater trochanters right more than left.  No pain with hip rotation.  She has good distal strength.  Neurological: She is alert and oriented to person, place, and time. She exhibits normal muscle tone. Coordination normal.  Skin: Skin is warm and dry. No rash noted. No erythema.  Psychiatric: She has a normal mood and affect. Her behavior is normal.  Nursing note and vitals reviewed.   Ortho Exam Imaging: Xr C-arm No Report  Result Date:  09/25/2017 Please see Notes or Procedures tab for imaging impression.   Past Medical/Family/Surgical/Social History: Medications & Allergies reviewed per EMR Patient Active Problem List   Diagnosis Date Noted  . Wears hearing aid   . Wears glasses   . Vaginal vault prolapse   . Sensation of pressure in bladder area   . PONV (postoperative nausea and vomiting)   . History of breast cancer   . History of adenomatous polyp of colon   . Hiatal hernia   . BPPV (benign paroxysmal positional vertigo)   . Palpitations 10/31/2016  . Prolapse of female pelvic organs 02/29/2016  . Radicular pain of right lower back 04/06/2015  . Compression fracture of L3 lumbar vertebra ([place]) 04/06/2015  . Senile osteoporosis 04/06/2015  . DDD (degenerative disc disease), lumbar 04/06/2015  . Facet arthropathy, lumbar 04/06/2015  . Other malaise  and fatigue 01/17/2014  . Hx of adenomatous colonic polyps 07/11/2013  . Osteoarthritis of right knee   . Hyperlipidemia LDL goal <100   . GERD (gastroesophageal reflux disease)   . Bladder prolapse, female, acquired   . Allergic sinusitis   . Esophageal reflux 10/12/2011  . Personal history of chemotherapy 08/08/1990   Past Medical History:  Diagnosis Date  . Asthma   . BPPV (benign paroxysmal positional vertigo)   . GERD (gastroesophageal reflux disease)   . Hiatal hernia   . History of adenomatous polyp of colon    tubular adenoma 2005 and  2014 tubular adenoma and  hyperplastic polyp  . History of breast cancer per pt no recurrence   dx 1992 --  s/p  left breast mastectomy and chemotherapy  . Hyperlipidemia LDL goal <100   . Osteoarthritis of right knee   . Personal history of chemotherapy 1992  . PONV (postoperative nausea and vomiting)    and "gets weak and light-leaded"  . Sensation of pressure in bladder area   . Vaginal vault prolapse    anterior  . Wears glasses   . Wears hearing aid    left only   Family History  Problem Relation Age of Onset  . Heart attack Mother   . Colon cancer Mother   . Hypertension Mother   . Heart disease Father   . Breast cancer Sister    Past Surgical History:  Procedure Laterality Date  . CATARACT EXTRACTION W/ INTRAOCULAR LENS  IMPLANT, BILATERAL  2014  . CHOLECYSTECTOMY  1985  . COLONOSCOPY  last one 06-04-2013  . CYSTOCELE REPAIR N/A 02/29/2016   Procedure: ANTERIOR VAULT PROLAPSE REPAIR COLOPLAST [place] FIXATION AUGMENTED AXIS DERMIS REPAIR  ;  Surgeon: Carolan Clines, MD;  Location: [facility];  Service: Urology;  Laterality: N/A;  . ESOPHAGOGASTRODUODENOSCOPY  08-31-2009  . LAPAROSCOPIC ASSISTED VAGINAL HYSTERECTOMY  01-10-2000   w/ Bilateral Salpingoophorectomy /  Anterior & Posterior repair/  Pubovaginal Sling  . MASTECTOMY Left 1992   Social History   Occupational History  . Occupation:  Retired Product manager: RETIRED  Tobacco Use  . Smoking status: Former Smoker    Years: 13.00    Types: Cigarettes    Last attempt to quit: 10/12/1983    Years since quitting: 33.9  . Smokeless tobacco: Never Used  Substance and Sexual Activity  . Alcohol use: No  . Drug use: No  . Sexual activity: Not on file

## 2017-10-19 ENCOUNTER — Other Ambulatory Visit: Payer: Medicare Other

## 2017-10-19 DIAGNOSIS — R739 Hyperglycemia, unspecified: Secondary | ICD-10-CM

## 2017-10-19 DIAGNOSIS — Z0000 Encounter for general adult medical examination without abnormal findings: Secondary | ICD-10-CM

## 2017-10-19 DIAGNOSIS — K635 Polyp of colon: Secondary | ICD-10-CM

## 2017-10-19 DIAGNOSIS — E538 Deficiency of other specified B group vitamins: Secondary | ICD-10-CM

## 2017-10-19 DIAGNOSIS — E785 Hyperlipidemia, unspecified: Secondary | ICD-10-CM

## 2017-10-20 LAB — LIPID PANEL
Cholesterol: 182 mg/dL (ref <200)
HDL: 59 mg/dL (ref >50)
LDL Cholesterol (Calc): 99 mg/dL (calc)
Non-HDL Cholesterol (Calc): 123 mg/dL (calc) (ref <130)
Total CHOL/HDL Ratio: 3.1 (calc) (ref <5.0)
Triglycerides: 139 mg/dL (ref <150)

## 2017-10-20 LAB — CBC WITH DIFFERENTIAL/PLATELET
Basophils Absolute: 23 cells/uL (ref 0–200)
Basophils Relative: 0.3 %
Eosinophils Absolute: 320 cells/uL (ref 15–500)
Eosinophils Relative: 4.1 %
HCT: 39.3 % (ref 35.0–45.0)
Hemoglobin: 13.4 g/dL (ref 11.7–15.5)
Lymphs Abs: 2075 cells/uL (ref 850–3900)
MCH: 30.9 pg (ref 27.0–33.0)
MCHC: 34.1 g/dL (ref 32.0–36.0)
MCV: 90.6 fL (ref 80.0–100.0)
MPV: 11.3 fL (ref 7.5–12.5)
Monocytes Relative: 6.4 %
Neutro Abs: 4883 cells/uL (ref 1500–7800)
Neutrophils Relative %: 62.6 %
Platelets: 201 10*3/uL (ref 140–400)
RBC: 4.34 10*6/uL (ref 3.80–5.10)
RDW: 12.5 % (ref 11.0–15.0)
Total Lymphocyte: 26.6 %
WBC mixed population: 499 cells/uL (ref 200–950)
WBC: 7.8 10*3/uL (ref 3.8–10.8)

## 2017-10-20 LAB — COMPLETE METABOLIC PANEL WITH GFR
AG Ratio: 2 (calc) (ref 1.0–2.5)
ALT: 16 U/L (ref 6–29)
AST: 17 U/L (ref 10–35)
Albumin: 4.3 g/dL (ref 3.6–5.1)
Alkaline phosphatase (APISO): 49 U/L (ref 33–130)
BUN: 18 mg/dL (ref 7–25)
CO2: 27 mmol/L (ref 20–32)
Calcium: 9.6 mg/dL (ref 8.6–10.4)
Chloride: 104 mmol/L (ref 98–110)
Creat: 0.83 mg/dL (ref 0.60–0.88)
GFR, Est African American: 76 mL/min/{1.73_m2} (ref >=60)
GFR, Est Non African American: 66 mL/min/{1.73_m2} (ref >=60)
Globulin: 2.2 g/dL (calc) (ref 1.9–3.7)
Glucose, Bld: 98 mg/dL (ref 65–99)
Potassium: 4 mmol/L (ref 3.5–5.3)
Sodium: 140 mmol/L (ref 135–146)
Total Bilirubin: 0.5 mg/dL (ref 0.2–1.2)
Total Protein: 6.5 g/dL (ref 6.1–8.1)

## 2017-10-20 LAB — HEMOGLOBIN A1C
Hgb A1c MFr Bld: 5.4 % of total Hgb (ref <5.7)
Mean Plasma Glucose: 108 (calc)
eAG (mmol/L): 6 (calc)

## 2017-10-20 LAB — VITAMIN B12: Vitamin B-12: 596 pg/mL (ref 200–1100)

## 2017-10-23 ENCOUNTER — Encounter: Payer: Self-pay | Admitting: Internal Medicine

## 2017-10-23 ENCOUNTER — Ambulatory Visit: Payer: Medicare Other | Admitting: Internal Medicine

## 2017-10-23 VITALS — BP 118/60 | HR 84 | Temp 98.5°F | Wt 173.0 lb

## 2017-10-23 DIAGNOSIS — K635 Polyp of colon: Secondary | ICD-10-CM

## 2017-10-23 DIAGNOSIS — M810 Age-related osteoporosis without current pathological fracture: Secondary | ICD-10-CM | POA: Diagnosis not present

## 2017-10-23 DIAGNOSIS — E785 Hyperlipidemia, unspecified: Secondary | ICD-10-CM

## 2017-10-23 DIAGNOSIS — N8110 Cystocele, unspecified: Secondary | ICD-10-CM | POA: Diagnosis not present

## 2017-10-23 DIAGNOSIS — K219 Gastro-esophageal reflux disease without esophagitis: Secondary | ICD-10-CM | POA: Diagnosis not present

## 2017-10-23 DIAGNOSIS — R002 Palpitations: Secondary | ICD-10-CM | POA: Diagnosis not present

## 2017-10-23 DIAGNOSIS — R42 Dizziness and giddiness: Secondary | ICD-10-CM

## 2017-10-23 DIAGNOSIS — J309 Allergic rhinitis, unspecified: Secondary | ICD-10-CM

## 2017-10-23 NOTE — Progress Notes (Signed)
Location:  [facility] Provider:  Tiffany [name], D.O., C.M.D.  Code Status: DNR Goals of Care:  Advanced Directives 04/03/2017  Does Patient Have a Medical Advance Directive? Yes  Type of Advance Directive Out of facility DNR (pink MOST or yellow form)  Does patient want to make changes to medical advance directive? No - Patient declined  Copy of [place] in Chart? -  Would patient like information on creating a medical advance directive? -  Pre-existing out of facility DNR order (yellow form or pink MOST form) Yellow form placed in chart (order not valid for inpatient use);Pink MOST form placed in chart (order not valid for inpatient use)    Chief Complaint  Patient presents with  . Medical Management of Chronic Issues    39mth follow-up     HPI: Patient is a 84 y.o. female seen today for medical management of chronic diseases.  She reports only feeling fair today. She reports feeling lightheaded over the winter. She thought it was nasal spray, so she stopped this last week. She has noticed that she is having waxing and waning of the lightheadedness since stopping the nasal spray. A month ago today she had an injection in her back and thought she would pass out, but think that is related the pain and being so close to the nerves. Ten days ago she was getting her daughters dog to "dog sit" and she felt very dizzy and thought she would pass out. Drove self home from airport, but still was not feeling well. She drank a lot of water and tylenol due to a bad headache. And her other daughter that lives with there came home after work and fixed her some dinner, but she was feeling better.   Today, she reports feeling some what better, not as lightheaded, and as some more energy. She reports sleeping well. Wakes up once to void. She is able to go right back to sleep. She reports having a good sleep pattern. She gets 7-8 hours of sleep. She was walking in the water at the [facility]  and she was walking the neighborhood. She does do the steps at home over winter as it was too cold. She reports eating well. She enjoys a more veggie and fruit diet, but is working to get more protein in her diet with use of protein intake. She reports trying to increase more water in her diet, she knows she is not drink as much as she should.   She has a prolapsed bladder which she reports is doing well with the use of a pessary. It is changed every two months. She reports best outcomes during the day hours, but at night some incontinence still at night if she drinks to much after 6 pm.   Osteoporosis: She is on Prolia (next dose in May), Citracal with vitamin D. She does mild intermittent pain in the right shoulder pain, but its not on-going and does not last. Her scoliosis and slipped disc give her the most issues for "bone pain." She reports doing well though overall.   GERD: She is Omeprazole as she continues to have some persistent symptoms with reflux if she is not on it. She has what she calls her "heartattack pain" and she will drink some milk and it will help her. She reports having increase in burping and gurgling.   Palpitations: She reports no problems with lopressor, but she continues to feel it as a "pause in the heartbeat" per  Dr. Meda Coffee. She does think the lopressor has helped some with this sensation.   Hyperlipidemia: Labs demonstrate control and she reports no problems with Pravachol. She denies muscle pains.    Past Medical History:  Diagnosis Date  . Asthma   . BPPV (benign paroxysmal positional vertigo)   . GERD (gastroesophageal reflux disease)   . Hiatal hernia   . History of adenomatous polyp of colon    tubular adenoma 2005 and  2014 tubular adenoma and  hyperplastic polyp  . History of breast cancer per pt no recurrence   dx 1992 --  s/p  left breast mastectomy and chemotherapy  . Hyperlipidemia LDL goal <100   . Osteoarthritis of right knee   . Personal history  of chemotherapy 1992  . PONV (postoperative nausea and vomiting)    and "gets weak and light-leaded"  . Sensation of pressure in bladder area   . Vaginal vault prolapse    anterior  . Wears glasses   . Wears hearing aid    left only    Past Surgical History:  Procedure Laterality Date  . CATARACT EXTRACTION W/ INTRAOCULAR LENS  IMPLANT, BILATERAL  2014  . CHOLECYSTECTOMY  1985  . COLONOSCOPY  last one 06-04-2013  . CYSTOCELE REPAIR N/A 02/29/2016   Procedure: ANTERIOR VAULT PROLAPSE REPAIR COLOPLAST [place] FIXATION AUGMENTED AXIS DERMIS REPAIR  ;  Surgeon: Carolan Clines, MD;  Location: [place];  Service: Urology;  Laterality: N/A;  . ESOPHAGOGASTRODUODENOSCOPY  08-31-2009  . LAPAROSCOPIC ASSISTED VAGINAL HYSTERECTOMY  01-10-2000   w/ Bilateral Salpingoophorectomy /  Anterior & Posterior repair/  Pubovaginal Sling  . MASTECTOMY Left 1992    Allergies  Allergen Reactions  . Codeine Nausea And Vomiting  . Morphine And Related Other (See Comments)    Nausea, and drop in blood pressure    Outpatient Encounter Medications as of 10/23/2017  Medication Sig  . acetaminophen (TYLENOL) 500 MG tablet Take 1,000 mg by mouth every morning.   Marland Kitchen albuterol (PROVENTIL HFA;VENTOLIN HFA) 108 (90 BASE) MCG/ACT inhaler Inhale 2 puffs into the lungs as directed.   . budesonide (RHINOCORT AQUA) 32 MCG/ACT nasal spray Place 1 spray into both nostrils as directed.  . Ca Carbonate-Mag Hydroxide (ROLAIDS PO) Take 2 tablets by mouth daily as needed (indigestion).  . Calcium Citrate-Vitamin D (CITRACAL + D PO) Take 2 tablets by mouth daily.  . fexofenadine (ALLEGRA) 180 MG tablet Take 180 mg by mouth every morning.   . fluticasone (FLONASE) 50 MCG/ACT nasal spray Place 1 spray into both nostrils every evening.   . metoprolol tartrate (LOPRESSOR) 25 MG tablet Take 0.5 tablets (12.5 mg total) by mouth 2 (two) times daily.  . Multiple Vitamin (MULTIVITAMIN) tablet Take 1 tablet by  mouth daily.  . Omega-3 Fatty Acids (FISH OIL) 1000 MG CAPS Take 1 capsule (1,000 mg total) by mouth daily.  Marland Kitchen omeprazole (PRILOSEC) 40 MG capsule TAKE 1 CAPSULE BY MOUTH 2 TIMES DAILY  . pravastatin (PRAVACHOL) 40 MG tablet take 1 tablet by mouth once daily  . Probiotic Product (PROBIOTIC FORMULA PO) Take 1 tablet by mouth every evening.    No facility-administered encounter medications on file as of 10/23/2017.     Review of Systems:  Review of Systems  Constitutional: Positive for malaise/fatigue. Negative for chills and fever.  HENT: Positive for hearing loss. Negative for ear pain.        Hearing aid  Eyes:       Glasses  Respiratory: Negative  for cough and shortness of breath.   Cardiovascular: Negative for chest pain, orthopnea and leg swelling.       "pauses"-  Gastrointestinal: Positive for heartburn. Negative for blood in stool.  Genitourinary: Negative for hematuria.  Musculoskeletal: Positive for back pain.  Neurological: Positive for weakness. Negative for dizziness, speech change, focal weakness and headaches.       Lightheadedness  Psychiatric/Behavioral: Positive for memory loss. Negative for depression. The patient is nervous/anxious. The patient does not have insomnia.        Anxiety she thinks is related to weather "SAD" season allergies disorder- Misplacing names    Health Maintenance  Topic Date Due  . TETANUS/TDAP  01/25/2025  . INFLUENZA VACCINE  Completed  . DEXA SCAN  Completed  . PNA vac Low Risk Adult  Completed    Physical Exam: Vitals:   10/23/17 1006  BP: 118/60  Pulse: 84  Temp: 98.5 F (36.9 C)  SpO2: 97%    Physical Exam  Constitutional: She is oriented to person, place, and time. She appears well-developed and well-nourished.  HENT:  Head: Normocephalic.  Neck: Normal range of motion. Neck supple. No thyromegaly present.  Cardiovascular: Normal rate, regular rhythm and normal heart sounds.  Pulses:      Dorsalis pedis pulses are  2+ on the right side, and 2+ on the left side.       Posterior tibial pulses are 1+ on the right side, and 1+ on the left side.  Pulmonary/Chest: Effort normal and breath sounds normal.  Musculoskeletal: Normal range of motion.  Neurological: She is alert and oriented to person, place, and time.  Skin: Skin is warm and dry. Capillary refill takes less than 2 seconds.  Psychiatric: She has a normal mood and affect. Her behavior is normal. Judgment and thought content normal.  Vitals reviewed.   Labs reviewed: Basic Metabolic Panel: Recent Labs    10/31/16 1111 02/13/17 0832 10/19/17 0809  NA 142 140 140  K 4.1 4.5 4.0  CL 104 105 104  CO2 26 22 27   GLUCOSE 81 97 98  BUN 15 17 18   CREATININE 0.75 0.84 0.83  CALCIUM 9.3 9.2 9.6  TSH 2.820  --   --    Liver Function Tests: Recent Labs    02/13/17 0832 10/19/17 0809  AST 16 17  ALT 13 16  ALKPHOS 54  --   BILITOT 0.5 0.5  PROT 6.7 6.5  ALBUMIN 4.2  --    No results for input(s): LIPASE, AMYLASE in the last 8760 hours. No results for input(s): AMMONIA in the last 8760 hours. CBC: Recent Labs    02/13/17 0832 10/19/17 0809  WBC 6.4 7.8  NEUTROABS 3,840 4,883  HGB 13.6 13.4  HCT 41.5 39.3  MCV 93.9 90.6  PLT 218 201   Lipid Panel: Recent Labs    02/13/17 0832 10/19/17 0809  CHOL 186 182  HDL 58 59  LDLCALC 95 99  TRIG 166* 139  CHOLHDL 3.2 3.1   Lab Results  Component Value Date   HGBA1C 5.4 10/19/2017    Procedures since last visit: Xr C-arm No Report  Result Date: 09/25/2017 Please see Notes or Procedures tab for imaging impression.   Assessment/Plan 1. Senile osteoporosis Will continue Prolia. Encouraged to start back walking again and maintain Vitamin D with Citracal.  2. Bladder prolapse, female, acquired Maintain use of pessary and is without trouble with this today. Encouraged to continue this.    3. Palpitations She  reports this has improved greatly. Her last appt with Cards was in  Jan and her EKG was good at that time. She has the occasional experience of this, but feels well overall. Will continue her lopressor and appreciate the collaboration with Cardiology in her care.    4. Gastroesophageal reflux disease without esophagitis Will continue her omeprazole as she reports continued symptoms when she stops it. Advised to take prior to all other medications and before eating. This will help her get the full benefit of the medication.   5. Hyperlipidemia LDL goal <100 He has a good lipid panel on labs is on Pravachol and doing well on it. Will continue this.   6. Allergic sinusitis She reports having bad sinus issues and is on nasal sprays which she thinks are causing her lightheadedness. Will stop these to see if they are the cause. She was educated to continue the saline spray.   7. Intermittent lightheadedness Questioning the cause of this. She has stopped on her own the nasal sprays she was using. She reports this improved her symptoms some. Will continue for a month and see if lightheadedness goes away. If it does not will see back, but suggested a follow up with cardiologist would be wise. Last Echo was good.   8. Polyp of colon, unspecified part of colon, unspecified type She is having concern about the polyps and the family history of colon caner. Advised she does not need a screening, but if she desires we can proceed.    Labs/tests ordered:  No orders of the defined types were placed in this encounter.    Next appt:  12/22/2017   Karen Kays, RN, AGPCNP, DNP [facility] [phone], [place] [address] Cell Phone (Mon-Fri 8am-5pm):  [phone] On Call:  [phone] & follow prompts after 5pm & weekends Office Phone:  [phone] Office Fax:  [phone]

## 2017-10-23 NOTE — Patient Instructions (Addendum)
Take your Prilosec upon waking up in the morning prior to taking in food or other medications.   Get back into a regular work out program. [place] [facility] [place]   Continue to eat a god diet and work on protein, as well as increasing water intake.   Continue the saline spray but not using the nasal sprays for a month to see if this helps with the lightheadedness. Should this not improve, it would be good to be seen again by Dr. Meda Coffee to make sure this is not heart related.

## 2017-11-08 ENCOUNTER — Other Ambulatory Visit: Payer: Self-pay | Admitting: Internal Medicine

## 2017-11-27 ENCOUNTER — Encounter: Payer: Self-pay | Admitting: Podiatry

## 2017-11-27 ENCOUNTER — Ambulatory Visit: Payer: Medicare Other | Admitting: Podiatry

## 2017-11-27 DIAGNOSIS — L84 Corns and callosities: Secondary | ICD-10-CM

## 2017-11-27 DIAGNOSIS — M216X9 Other acquired deformities of unspecified foot: Secondary | ICD-10-CM

## 2017-11-28 NOTE — Progress Notes (Signed)
Subjective:   Patient ID: Rebecca Guzman, female   DOB: 84 y.o.   MRN: [id number]   HPI Patient presents stating she has lesions on the bottom of both feet that get painful   ROS      Objective:  Physical Exam  Porokeratotic lesions bilateral     Assessment:  Reviewed porokeratotic lesions     Plan:  Debridement of lesions accomplished no iatrogenic bleeding and reappoint as needed

## 2017-11-30 ENCOUNTER — Ambulatory Visit (INDEPENDENT_AMBULATORY_CARE_PROVIDER_SITE_OTHER): Payer: Medicare Other | Admitting: Orthotics

## 2017-11-30 DIAGNOSIS — L84 Corns and callosities: Secondary | ICD-10-CM

## 2017-11-30 DIAGNOSIS — M216X9 Other acquired deformities of unspecified foot: Secondary | ICD-10-CM | POA: Diagnosis not present

## 2017-11-30 NOTE — Progress Notes (Signed)
Patient seen today for casting f/o per dr. Paulla Dolly to address painful keratomas b/l . Patient was cast in foam with betadine markers.  Richy to fab LW accommodative device with approp offloads.   Patient advised of 398.00 charge.

## 2017-11-30 NOTE — Addendum Note (Signed)
Addended by: Velora Heckler on: 11/30/2017 09:39 AM   Modules accepted: Level of Service

## 2017-12-04 ENCOUNTER — Other Ambulatory Visit: Payer: Self-pay | Admitting: Cardiology

## 2017-12-06 ENCOUNTER — Other Ambulatory Visit: Payer: Self-pay | Admitting: *Deleted

## 2017-12-06 MED ORDER — METOPROLOL TARTRATE 25 MG PO TABS: 12.5000 mg | ORAL_TABLET | Freq: Two times a day (BID) | ORAL | 2 refills | Status: DC

## 2017-12-06 NOTE — Telephone Encounter (Signed)
Patient called and requested a refill on metoprolol. Her pharmacy requested metoprolol succinate(Toprol XL). Patient informed me that she does not take this and she never has and she feels that this may have been a error on the pharmacy's behalf as it was [facility] but they converted over to [facility]. She is aware that I will note this and get the correct rx sent to her pharmacy. Patient verbalized understanding and appreciation.

## 2017-12-21 ENCOUNTER — Ambulatory Visit: Payer: Medicare Other | Admitting: Orthotics

## 2017-12-21 DIAGNOSIS — M216X9 Other acquired deformities of unspecified foot: Secondary | ICD-10-CM

## 2017-12-21 DIAGNOSIS — L84 Corns and callosities: Secondary | ICD-10-CM

## 2017-12-21 NOTE — Progress Notes (Signed)
Patient came in today to pick up custom made foot orthotics.  The goals were accomplished and the patient reported no dissatisfaction with said orthotics.  Patient was advised of breakin period and how to report any issues. 

## 2017-12-22 ENCOUNTER — Ambulatory Visit (INDEPENDENT_AMBULATORY_CARE_PROVIDER_SITE_OTHER): Payer: Medicare Other

## 2017-12-22 DIAGNOSIS — M810 Age-related osteoporosis without current pathological fracture: Secondary | ICD-10-CM | POA: Diagnosis not present

## 2017-12-22 MED ORDER — DENOSUMAB 60 MG/ML ~~LOC~~ SOSY
60.0000 mg | PREFILLED_SYRINGE | Freq: Once | SUBCUTANEOUS | Status: AC
  Administered 2017-12-22: 60 mg via SUBCUTANEOUS

## 2018-01-08 ENCOUNTER — Ambulatory Visit: Payer: Medicare Other | Admitting: Orthotics

## 2018-01-08 DIAGNOSIS — M216X9 Other acquired deformities of unspecified foot: Secondary | ICD-10-CM

## 2018-01-08 DIAGNOSIS — L84 Corns and callosities: Secondary | ICD-10-CM

## 2018-01-08 NOTE — Progress Notes (Signed)
Adjusted f/o by adding 3/16 left RIGHT.

## 2018-02-05 ENCOUNTER — Other Ambulatory Visit: Payer: Self-pay | Admitting: Internal Medicine

## 2018-02-05 DIAGNOSIS — R1013 Epigastric pain: Secondary | ICD-10-CM

## 2018-02-05 DIAGNOSIS — R079 Chest pain, unspecified: Secondary | ICD-10-CM

## 2018-02-05 DIAGNOSIS — K219 Gastro-esophageal reflux disease without esophagitis: Secondary | ICD-10-CM

## 2018-02-15 ENCOUNTER — Other Ambulatory Visit: Payer: Self-pay | Admitting: Internal Medicine

## 2018-02-21 ENCOUNTER — Other Ambulatory Visit: Payer: Self-pay | Admitting: Internal Medicine

## 2018-02-21 DIAGNOSIS — Z1231 Encounter for screening mammogram for malignant neoplasm of breast: Secondary | ICD-10-CM

## 2018-03-28 ENCOUNTER — Ambulatory Visit: Payer: Medicare Other | Admitting: Cardiology

## 2018-03-28 ENCOUNTER — Encounter: Payer: Self-pay | Admitting: Cardiology

## 2018-03-28 VITALS — BP 116/70 | HR 88 | Ht 65.0 in | Wt 173.0 lb

## 2018-03-28 DIAGNOSIS — I493 Ventricular premature depolarization: Secondary | ICD-10-CM

## 2018-03-28 DIAGNOSIS — E785 Hyperlipidemia, unspecified: Secondary | ICD-10-CM

## 2018-03-28 DIAGNOSIS — E782 Mixed hyperlipidemia: Secondary | ICD-10-CM | POA: Diagnosis not present

## 2018-03-28 DIAGNOSIS — R002 Palpitations: Secondary | ICD-10-CM | POA: Diagnosis not present

## 2018-03-28 MED ORDER — METOPROLOL TARTRATE 25 MG PO TABS: 25.0000 mg | ORAL_TABLET | Freq: Two times a day (BID) | ORAL | 3 refills | Status: DC

## 2018-03-28 NOTE — Patient Instructions (Signed)
Medication Instructions:   INCREASE YOUR METOPROLOL TARTRATE TO 25 MG TWICE DAILY    Follow-Up:  Your physician wants you to follow-up in: [place] will receive a reminder letter in the mail two months in advance. If you don't receive a letter, please call our office to schedule the follow-up appointment.        If you need a refill on your cardiac medications before your next appointment, please call your pharmacy.

## 2018-03-28 NOTE — Progress Notes (Signed)
Cardiology Office Note    Date:  03/28/2018   ID:  Rebecca Guzman, DOB Mar 08, 1934, MRN [id number]  PCP:  [name], DO  Cardiologist:  Ena Dawley, MD   Referring physician: [name], DO   History of Present Illness:  Rebecca Guzman is a 84 y.o. female who is a very pleasant patient coming with concern of palpitations. She states that she has been experiencing palpitations with increased frequency currently every day especially at rest and at night. They would last a few seconds and are not associated with shortness of breath chest pain dizziness or syncope other very uncomfortable and she is concerned. They usually happen on days when she exerts herself. The patient exercises frequently as part of [employer] program and doesn't experience any chest pain or shortness of breath. She has a very remote history of smoking when she was young. Her family history includes early coronary artery disease in her father died at age of 72 secondary to heart attack. The patient denies any lower extremity edema, no claudication no orthopnea or proximal nocturnal dyspnea.  12/29/2016 - patient underwent Holter monitoring that showed frequent PACs (700 in 34 hrs) and few very short runs of atrial tachycardia with the longest lasting 4 beats. She was started on metoprolol 12.5 mg by mouth twice a day with significant improvement of symptoms. She also had an echocardiogram performed that showed LVEF 60-65% and grade 1 diastolic dysfunction, trivial MR, normal left atrial size and mild TR.  08/18/2017 - 6 months follow up, stable palpitations that'll cure approximately once a week and last few seconds, they're not associated with dizziness chest pain or falls. She remains very active, she walks daily in her neighborhood and does water aerobic and [employer] with no chest pain shortness of breath. She also denies claudications no lower extremity edema orthopnea or proximal nocturnal dyspnea. She  tolerates pravastatin well.  March 28, 2018, the patient continues to feel well, she continues to go to [facility] to exercise, mostly water walking, denies any chest pain shortness of breath, she has occasional palpitations possibly more frequent than previously.  No side effects with her medications including pravastatin.  She has no dizziness or falls.  Past Medical History:  Diagnosis Date  . Asthma   . BPPV (benign paroxysmal positional vertigo)   . Compression fracture of L3 lumbar vertebra 04/06/2015  . GERD (gastroesophageal reflux disease)   . Hiatal hernia   . History of adenomatous polyp of colon    tubular adenoma 2005 and  2014 tubular adenoma and  hyperplastic polyp  . History of breast cancer per pt no recurrence   dx 1992 --  s/p  left breast mastectomy and chemotherapy  . Hyperlipidemia LDL goal <100   . Osteoarthritis of right knee   . Personal history of chemotherapy 1992  . PONV (postoperative nausea and vomiting)    and "gets weak and light-leaded"  . Sensation of pressure in bladder area   . Vaginal vault prolapse    anterior  . Wears glasses   . Wears hearing aid    left only    Past Surgical History:  Procedure Laterality Date  . CATARACT EXTRACTION W/ INTRAOCULAR LENS  IMPLANT, BILATERAL  2014  . CHOLECYSTECTOMY  1985  . COLONOSCOPY  last one 06-04-2013  . CYSTOCELE REPAIR N/A 02/29/2016   Procedure: ANTERIOR VAULT PROLAPSE REPAIR COLOPLAST [place] FIXATION AUGMENTED AXIS DERMIS REPAIR  ;  Surgeon: Carolan Clines, MD;  Location: [facility]  [place];  Service: Urology;  Laterality: N/A;  . ESOPHAGOGASTRODUODENOSCOPY  08-31-2009  . LAPAROSCOPIC ASSISTED VAGINAL HYSTERECTOMY  01-10-2000   w/ Bilateral Salpingoophorectomy /  Anterior & Posterior repair/  Pubovaginal Sling  . MASTECTOMY Left 1992    Current Medications: Outpatient Medications Prior to Visit  Medication Sig Dispense Refill  . acetaminophen (TYLENOL) 500 MG tablet Take 1,000 mg by  mouth every morning.     . Ca Carbonate-Mag Hydroxide (ROLAIDS PO) Take 2 tablets by mouth daily as needed (indigestion).    . Calcium Citrate-Vitamin D (CITRACAL + D PO) Take 2 tablets by mouth daily.    Marland Kitchen loratadine (CLARITIN) 10 MG tablet Take 10 mg by mouth daily.    . Multiple Vitamin (MULTIVITAMIN) tablet Take 1 tablet by mouth daily.    . Omega-3 Fatty Acids (FISH OIL) 1000 MG CAPS Take 1 capsule (1,000 mg total) by mouth daily.  0  . omeprazole (PRILOSEC) 40 MG capsule TAKE 1 CAPSULE BY MOUTH TWICE A DAY 60 capsule 0  . pravastatin (PRAVACHOL) 40 MG tablet TAKE 1 TABLET BY MOUTH ONCE DAILY 90 tablet 1  . Probiotic Product (PROBIOTIC FORMULA PO) Take 1 tablet by mouth every evening.     . metoprolol tartrate (LOPRESSOR) 25 MG tablet Take 0.5 tablets (12.5 mg total) by mouth 2 (two) times daily. 90 tablet 2  . albuterol (PROVENTIL HFA;VENTOLIN HFA) 108 (90 BASE) MCG/ACT inhaler Inhale 2 puffs into the lungs as directed.     . fexofenadine (ALLEGRA) 180 MG tablet Take 180 mg by mouth every morning.      No facility-administered medications prior to visit.      Allergies:   Codeine and Morphine and related   Social History   Socioeconomic History  . Marital status: Widowed    Spouse name: Not on file  . Number of children: 4  . Years of education: Not on file  . Highest education level: Not on file  Occupational History  . Occupation: Retired Product manager: RETIRED  Social Needs  . Financial resource strain: Not on file  . Food insecurity:    Worry: Not on file    Inability: Not on file  . Transportation needs:    Medical: Not on file    Non-medical: Not on file  Tobacco Use  . Smoking status: Former Smoker    Years: 13.00    Types: Cigarettes    Last attempt to quit: 10/12/1983    Years since quitting: 34.4  . Smokeless tobacco: Never Used  Substance and Sexual Activity  . Alcohol use: No  . Drug use: No  . Sexual activity: Not on file  Lifestyle  . Physical  activity:    Days per week: Not on file    Minutes per session: Not on file  . Stress: Not on file  Relationships  . Social connections:    Talks on phone: Not on file    Gets together: Not on file    Attends religious service: Not on file    Active member of club or organization: Not on file    Attends meetings of clubs or organizations: Not on file    Relationship status: Not on file  Other Topics Concern  . Not on file  Social History Narrative  . Not on file     Family History:  The patient's family history includes Breast cancer in her sister; Colon cancer in her mother; Heart attack in her mother; Heart  disease in her father; Hypertension in her mother.   ROS:   Please see the history of present illness.    ROS All other systems reviewed and are negative.  PHYSICAL EXAM:   VS:  BP 116/70   Pulse 88   Ht 5\' 5"  (1.651 m)   Wt 173 lb (78.5 kg)   LMP 11/21/1985   SpO2 99%   BMI 28.79 kg/m    GEN: Well nourished, well developed, in no acute distress  HEENT: normal  Neck: no JVD, carotid bruits, or masses Cardiac: RRR; no murmurs, rubs, or gallops,no edema, Poor pulses in the left lower extremity.  Respiratory:  clear to auscultation bilaterally, normal work of breathing GI: soft, nontender, nondistended, + BS MS: no deformity or atrophy  Skin: warm and dry, no rash Neuro:  Alert and Oriented x 3, Strength and sensation are intact Psych: euthymic mood, full affect  Wt Readings from Last 3 Encounters:  03/28/18 173 lb (78.5 kg)  10/23/17 173 lb (78.5 kg)  08/18/17 174 lb 12.8 oz (79.3 kg)    Studies/Labs Reviewed:   EKG:  EKG is ordered today.  The ekg ordered today demonstrates Normal sinus rhythm, normal EKG. Personally reviewed.  Recent Labs: 10/19/2017: ALT 16; BUN 18; Creat 0.83; Hemoglobin 13.4; Platelets 201; Potassium 4.0; Sodium 140   Lipid Panel    Component Value Date/Time   CHOL 182 10/19/2017 0809   CHOL 187 01/26/2016 0802   TRIG 139 10/19/2017  0809   HDL 59 10/19/2017 0809   HDL 59 01/26/2016 0802   CHOLHDL 3.1 10/19/2017 0809   VLDL 33 (H) 02/13/2017 0832   LDLCALC 99 10/19/2017 0809    Additional studies/ records that were reviewed today include:   TTE: 10/2016 - Left ventricle: The cavity size was normal. Wall thickness was   increased in a pattern of mild LVH. Systolic function was normal.   The estimated ejection fraction was in the range of 60% to 65%.   Wall motion was normal; there were no regional wall motion   abnormalities. Doppler parameters are consistent with abnormal   left ventricular relaxation (grade 1 diastolic dysfunction). The   E/e&' ratio is >15, suggesting elevated LV filling pressure. - Mitral valve: Mildly thickened and sclerotic anterior leaflet.   There was trivial regurgitation. - Left atrium: The atrium was normal in size. - Tricuspid valve: There was mild regurgitation. - Pulmonary arteries: PA peak pressure: 37 mm Hg (S). - Inferior vena cava: The vessel was normal in size. The   respirophasic diameter changes were in the normal range (>= 50%),   consistent with normal central venous pressure.  Impressions:  - LVEF 60-65%, mild LVH, normal wall motion, grade 1 DD with   elevated LV filling pressure, trivial MR, normal LA size, mild   TR, RVSP 37 mmHg, normal IVC.  EKG performed today November 26, 2017 shows normal rhythm normal EKG, unchanged from prior. This was personally reviewed.    ASSESSMENT:    1. Palpitations   2. Hyperlipidemia LDL goal <100   3. Mixed hyperlipidemia   4. PVC's (premature ventricular contractions)    PLAN:  In order of problems listed above:  1. Palpitations, Normal echocardiogram, normal left atrial size, normal labs including electrolytes and TSH. Holter monitor showed 700 PACs in 36 hours and few very short runs of atrial tachycardia, significant symptoms improvement with metoprolol 12.5 mg by mouth twice a day.  She now has more frequent  palpitations, will increase metoprolol  to 25 mg p.o. twice daily. 2. Blood pressure is well controlled 3. Hyperlipidemia followed by her primary care physician, she is on pravastatin and she is tolerating it well.  All lipids were at goal in April 2019, she is going to have them rechecked next months.   4. Occasional right ankle edema, she is advised to decrease amount of salt in her diet and consider an ankle compression sleeve.  Follow-up in 6 months.  Medication Adjustments/Labs and Tests Ordered: Current medicines are reviewed at length with the patient today.  Concerns regarding medicines are outlined above.  Medication changes, Labs and Tests ordered today are listed in the Patient Instructions below. Patient Instructions  Medication Instructions:   INCREASE YOUR METOPROLOL TARTRATE TO 25 MG TWICE DAILY    Follow-Up:  Your physician wants you to follow-up in: [place] will receive a reminder letter in the mail two months in advance. If you don't receive a letter, please call our office to schedule the follow-up appointment.        If you need a refill on your cardiac medications before your next appointment, please call your pharmacy.      Signed, Ena Dawley, MD  03/28/2018 10:59 AM    [facility], [place], [place]  [address] Phone: [phone]; Fax: [phone]

## 2018-03-29 ENCOUNTER — Ambulatory Visit: Payer: Self-pay

## 2018-04-02 ENCOUNTER — Encounter: Payer: Medicare Other | Admitting: Internal Medicine

## 2018-04-06 ENCOUNTER — Ambulatory Visit
Admission: RE | Admit: 2018-04-06 | Discharge: 2018-04-06 | Disposition: A | Discharge status: Home / Self Care | Payer: Medicare Other | Source: Ambulatory Visit | Attending: Internal Medicine | Admitting: Internal Medicine

## 2018-04-06 ENCOUNTER — Other Ambulatory Visit: Payer: Self-pay | Admitting: Internal Medicine

## 2018-04-06 DIAGNOSIS — R079 Chest pain, unspecified: Secondary | ICD-10-CM

## 2018-04-06 DIAGNOSIS — R1013 Epigastric pain: Secondary | ICD-10-CM

## 2018-04-06 DIAGNOSIS — K219 Gastro-esophageal reflux disease without esophagitis: Secondary | ICD-10-CM

## 2018-04-06 DIAGNOSIS — Z1231 Encounter for screening mammogram for malignant neoplasm of breast: Secondary | ICD-10-CM

## 2018-04-10 ENCOUNTER — Other Ambulatory Visit: Payer: Self-pay | Admitting: Internal Medicine

## 2018-04-10 DIAGNOSIS — R928 Other abnormal and inconclusive findings on diagnostic imaging of breast: Secondary | ICD-10-CM

## 2018-04-11 ENCOUNTER — Encounter: Payer: Medicare Other | Admitting: Internal Medicine

## 2018-04-16 ENCOUNTER — Other Ambulatory Visit: Payer: Self-pay | Admitting: Internal Medicine

## 2018-04-16 ENCOUNTER — Ambulatory Visit
Admission: RE | Admit: 2018-04-16 | Discharge: 2018-04-16 | Disposition: A | Discharge status: Home / Self Care | Payer: Medicare Other | Source: Ambulatory Visit | Attending: Internal Medicine | Admitting: Internal Medicine

## 2018-04-16 DIAGNOSIS — R928 Other abnormal and inconclusive findings on diagnostic imaging of breast: Secondary | ICD-10-CM

## 2018-04-16 DIAGNOSIS — N6310 Unspecified lump in the right breast, unspecified quadrant: Secondary | ICD-10-CM

## 2018-04-17 ENCOUNTER — Ambulatory Visit
Admission: RE | Admit: 2018-04-17 | Discharge: 2018-04-17 | Disposition: A | Discharge status: Home / Self Care | Payer: Medicare Other | Source: Ambulatory Visit | Attending: Internal Medicine | Admitting: Internal Medicine

## 2018-04-17 DIAGNOSIS — N6310 Unspecified lump in the right breast, unspecified quadrant: Secondary | ICD-10-CM

## 2018-04-18 ENCOUNTER — Telehealth: Payer: Self-pay | Admitting: Hematology

## 2018-04-18 NOTE — Telephone Encounter (Signed)
Spoke to patient to confirm morning [facility] appointment for 9/18, packet mailed to patient

## 2018-04-19 ENCOUNTER — Encounter: Payer: Self-pay | Admitting: *Deleted

## 2018-04-19 DIAGNOSIS — Z170 Estrogen receptor positive status [ER+]: Principal | ICD-10-CM | POA: Insufficient documentation

## 2018-04-19 DIAGNOSIS — C50411 Malignant neoplasm of upper-outer quadrant of right female breast: Secondary | ICD-10-CM | POA: Insufficient documentation

## 2018-04-19 DIAGNOSIS — C50211 Malignant neoplasm of upper-inner quadrant of right female breast: Secondary | ICD-10-CM

## 2018-04-19 DIAGNOSIS — Z853 Personal history of malignant neoplasm of breast: Secondary | ICD-10-CM | POA: Insufficient documentation

## 2018-04-19 HISTORY — PX: BREAST BIOPSY: SHX20

## 2018-04-23 ENCOUNTER — Other Ambulatory Visit: Payer: Self-pay | Admitting: *Deleted

## 2018-04-23 DIAGNOSIS — Z170 Estrogen receptor positive status [ER+]: Principal | ICD-10-CM

## 2018-04-23 DIAGNOSIS — C50211 Malignant neoplasm of upper-inner quadrant of right female breast: Secondary | ICD-10-CM

## 2018-04-24 ENCOUNTER — Other Ambulatory Visit: Payer: Medicare Other

## 2018-04-24 ENCOUNTER — Telehealth (INDEPENDENT_AMBULATORY_CARE_PROVIDER_SITE_OTHER): Payer: Self-pay | Admitting: Physical Medicine and Rehabilitation

## 2018-04-24 NOTE — Telephone Encounter (Signed)
Scheduled for 05/07/18 with driver.

## 2018-04-24 NOTE — Telephone Encounter (Signed)
Yes ok 

## 2018-04-25 ENCOUNTER — Other Ambulatory Visit: Payer: Self-pay | Admitting: General Surgery

## 2018-04-25 ENCOUNTER — Inpatient Hospital Stay: Payer: Medicare Other

## 2018-04-25 ENCOUNTER — Encounter: Payer: Self-pay | Admitting: *Deleted

## 2018-04-25 ENCOUNTER — Ambulatory Visit
Admission: RE | Admit: 2018-04-25 | Discharge: 2018-04-25 | Disposition: A | Discharge status: Home / Self Care | Payer: Medicare Other | Source: Ambulatory Visit | Attending: Radiation Oncology | Admitting: Radiation Oncology

## 2018-04-25 ENCOUNTER — Inpatient Hospital Stay: Payer: Medicare Other | Attending: Hematology and Oncology | Admitting: Hematology and Oncology

## 2018-04-25 ENCOUNTER — Encounter: Payer: Self-pay | Admitting: Hematology and Oncology

## 2018-04-25 ENCOUNTER — Ambulatory Visit: Payer: Medicare Other | Admitting: Physical Therapy

## 2018-04-25 ENCOUNTER — Telehealth: Payer: Self-pay | Admitting: Hematology and Oncology

## 2018-04-25 DIAGNOSIS — C50211 Malignant neoplasm of upper-inner quadrant of right female breast: Secondary | ICD-10-CM

## 2018-04-25 DIAGNOSIS — Z9012 Acquired absence of left breast and nipple: Secondary | ICD-10-CM

## 2018-04-25 DIAGNOSIS — Z170 Estrogen receptor positive status [ER+]: Secondary | ICD-10-CM | POA: Diagnosis not present

## 2018-04-25 DIAGNOSIS — Z87891 Personal history of nicotine dependence: Secondary | ICD-10-CM | POA: Diagnosis not present

## 2018-04-25 DIAGNOSIS — Z853 Personal history of malignant neoplasm of breast: Secondary | ICD-10-CM | POA: Insufficient documentation

## 2018-04-25 LAB — CMP (CANCER CENTER ONLY)
ALBUMIN: 3.9 g/dL (ref 3.5–5.0)
ALT: 19 U/L (ref 0–44)
AST: 18 U/L (ref 15–41)
Alkaline Phosphatase: 60 U/L (ref 38–126)
Anion gap: 11 (ref 5–15)
BUN: 15 mg/dL (ref 8–23)
CHLORIDE: 109 mmol/L (ref 98–111)
CO2: 23 mmol/L (ref 22–32)
CREATININE: 0.84 mg/dL (ref 0.44–1.00)
Calcium: 9.4 mg/dL (ref 8.9–10.3)
GFR, Est AFR Am: >60 mL/min (ref >60)
GFR, Estimated: >60 mL/min (ref >60)
GLUCOSE: 118 mg/dL — AB (ref 70–99)
Potassium: 3.7 mmol/L (ref 3.5–5.1)
Sodium: 143 mmol/L (ref 135–145)
Total Bilirubin: 0.6 mg/dL (ref 0.3–1.2)
Total Protein: 6.9 g/dL (ref 6.5–8.1)

## 2018-04-25 LAB — CBC WITH DIFFERENTIAL (CANCER CENTER ONLY)
Basophils Absolute: 0 10*3/uL (ref 0.0–0.1)
Basophils Relative: 1 %
Eosinophils Absolute: 0.3 10*3/uL (ref 0.0–0.5)
Eosinophils Relative: 4 %
HEMATOCRIT: 37.9 % (ref 34.8–46.6)
HEMOGLOBIN: 12.9 g/dL (ref 11.6–15.9)
LYMPHS ABS: 1.5 10*3/uL (ref 0.9–3.3)
Lymphocytes Relative: 25 %
MCH: 30.2 pg (ref 25.1–34.0)
MCHC: 34.1 g/dL (ref 31.5–36.0)
MCV: 88.7 fL (ref 79.5–101.0)
MONOS PCT: 6 %
Monocytes Absolute: 0.4 10*3/uL (ref 0.1–0.9)
NEUTROS PCT: 64 %
Neutro Abs: 3.9 10*3/uL (ref 1.5–6.5)
Platelet Count: 192 10*3/uL (ref 145–400)
RBC: 4.27 MIL/uL (ref 3.70–5.45)
RDW: 13.3 % (ref 11.2–14.5)
WBC Count: 6 10*3/uL (ref 3.9–10.3)

## 2018-04-25 NOTE — Progress Notes (Signed)
Nutrition Assessment  Reason for Assessment:  Pt seen in Breast Clinic  ASSESSMENT:   84 year old female with new diagnosis of breast cancer.  Past medical history of left breast cancer, osteoporosis, reflex, high cholesterol  Patient reports good appetite  Medications:  reviewed  Labs: reviewed  Anthropometrics:   Height: 65 inches Weight: 174 lb 1.6 oz BMI: 28   NUTRITION DIAGNOSIS: Food and nutrition related knowledge deficit related to new diagnosis of breast cancer as evidenced by no prior need for nutrition related information.  INTERVENTION:   Discussed and provided packet of information regarding nutritional tips for breast cancer patients.  Questions answered.  Teachback method used.  Contact information provided and patient knows to contact me with questions/concerns.    MONITORING, EVALUATION, and GOAL: Pt will consume a healthy plant based diet to maintain lean body mass throughout treatment.   Sami Froh B. Zenia Resides, [place], [place] Registered Dietitian [phone] (pager)

## 2018-04-25 NOTE — Progress Notes (Signed)
Radiation Oncology         [phone] ________________________________  Multidisciplinary Breast Oncology Clinic [facility]) Initial Outpatient Consultation  Name: KRYSTINE PABST MRN: [id number]  Date: 04/25/2018  DOB: Mar 22, 1934  [id number], Rexene Edison, DO  [name], MD   REFERRING PHYSICIAN: [name], MD  DIAGNOSIS: The encounter diagnosis was Malignant neoplasm of upper-inner quadrant of right breast in female, estrogen receptor positive ([place]).    Malignant neoplasm of upper-inner quadrant of right breast in female, estrogen receptor positive ([place])   1992 Miscellaneous    Left breast cancer treated with mastectomy followed by adjuvant chemotherapy and 5 years of tamoxifen    04/17/2018 Initial Diagnosis    Screening detected right breast mass, by ultrasound measured 5 mm UIQ middle depth, biopsy revealed grade 1 IDC with DCIS, ER 100%, PR 100%, HER-2 -1+ by IHC, T1 a N0 stage I a clinical stage       ICD-10-CM   1. Malignant neoplasm of upper-inner quadrant of right breast in female, estrogen receptor positive ([place]) C50.211    Z17.0     HISTORY OF PRESENT ILLNESS::Norine K Blasdel is a 84 y.o. female who is presenting to the office today for evaluation of her newly diagnosed breast cancer. She is doing well overall.   She had routine screening mammography on 04/06/18 showing a possible abnormality in the right breast. She underwent bilateral diagnostic mammography with tomography and right breast ultrasonography at [facility] on 04/16/18 showing: mass at 1 o'clock measuring 6 mm.  Biopsy on 04/17/18 showed: invasive ductal carcinoma with DCIS, grade 1. Prognostic indicators significant for: estrogen receptor, 100% positive and progesterone receptor, 100% positive, both with strong staining intensity. Proliferation marker Ki67 at <1%. HER2 negative.  Menarche: 84 years old Age at first live birth: 84 years old GP: 4 LMP: unsure Contraceptive: no HRT: yes, for a few  months in 2000   The patient was referred today for presentation in the multidisciplinary conference.  Radiology studies and pathology slides were presented there for review and discussion of treatment options.  A consensus was discussed regarding potential next steps.  PREVIOUS RADIATION THERAPY: No  PAST MEDICAL HISTORY:  has a past medical history of Asthma, BPPV (benign paroxysmal positional vertigo), Compression fracture of L3 lumbar vertebra (04/06/2015), GERD (gastroesophageal reflux disease), Hiatal hernia, History of adenomatous polyp of colon, History of breast cancer (per pt no recurrence), Hyperlipidemia LDL goal <100, Osteoarthritis of right knee, Personal history of chemotherapy (1992), PONV (postoperative nausea and vomiting), Sensation of pressure in bladder area, Vaginal vault prolapse, Wears glasses, and Wears hearing aid.    PAST SURGICAL HISTORY: Past Surgical History:  Procedure Laterality Date  . CATARACT EXTRACTION W/ INTRAOCULAR LENS  IMPLANT, BILATERAL  2014  . CHOLECYSTECTOMY  1985  . COLONOSCOPY  last one 06-04-2013  . CYSTOCELE REPAIR N/A 02/29/2016   Procedure: ANTERIOR VAULT PROLAPSE REPAIR COLOPLAST [place] FIXATION AUGMENTED AXIS DERMIS REPAIR  ;  Surgeon: Carolan Clines, MD;  Location: [facility];  Service: Urology;  Laterality: N/A;  . ESOPHAGOGASTRODUODENOSCOPY  08-31-2009  . LAPAROSCOPIC ASSISTED VAGINAL HYSTERECTOMY  01-10-2000   w/ Bilateral Salpingoophorectomy /  Anterior & Posterior repair/  Pubovaginal Sling  . MASTECTOMY Left 1992    FAMILY HISTORY: family history includes Breast cancer in her sister; Colon cancer in her mother; Heart attack in her mother; Heart disease in her father; Hypertension in her mother.  SOCIAL HISTORY:  reports that she quit smoking about 34 years ago. Her  smoking use included cigarettes. She quit after 13.00 years of use. She has never used smokeless tobacco. She reports that she does not drink alcohol  or use drugs.  ALLERGIES: Codeine and Morphine and related  MEDICATIONS:  Current Outpatient Medications  Medication Sig Dispense Refill  . acetaminophen (TYLENOL) 500 MG tablet Take 1,000 mg by mouth every morning.     . Ca Carbonate-Mag Hydroxide (ROLAIDS PO) Take 2 tablets by mouth daily as needed (indigestion).    . Calcium Citrate-Vitamin D (CITRACAL + D PO) Take 2 tablets by mouth daily.    Marland Kitchen loratadine (CLARITIN) 10 MG tablet Take 10 mg by mouth daily.    . metoprolol tartrate (LOPRESSOR) 25 MG tablet Take 1 tablet (25 mg total) by mouth 2 (two) times daily. 180 tablet 3  . Multiple Vitamin (MULTIVITAMIN) tablet Take 1 tablet by mouth daily.    . Omega-3 Fatty Acids (FISH OIL) 1000 MG CAPS Take 1 capsule (1,000 mg total) by mouth daily.  0  . omeprazole (PRILOSEC) 40 MG capsule TAKE 1 CAPSULE BY MOUTH TWICE A DAY 60 capsule 0  . pravastatin (PRAVACHOL) 40 MG tablet TAKE 1 TABLET BY MOUTH ONCE DAILY 90 tablet 1  . Probiotic Product (PROBIOTIC FORMULA PO) Take 1 tablet by mouth every evening.      No current facility-administered medications for this encounter.     REVIEW OF SYSTEMS: A 10+ POINT REVIEW OF SYSTEMS WAS OBTAINED including neurology, dermatology, psychiatry, cardiac, respiratory, lymph, extremities, GI, GU, musculoskeletal, constitutional, reproductive, HEENT. On the provided, she reports aching lower back/hip pain, irregular heartbeat, dribbling urine, incontinence, joint pain, arthritis, and steroid use. She denies any other symptoms.    PHYSICAL EXAM: Vitals with BMI 04/25/2018  Height _0   Weight 174 lbs 2 oz  BMI 74.25  Systolic 956  Diastolic 84  Pulse 87  Respirations 17  Lungs are clear to auscultation bilaterally. Heart has regular rate and rhythm. No palpable cervical, supraclavicular, or axillary adenopathy. Abdomen soft, non-tender, normal bowel sounds. Breast: left chest wall: patient has a well-healed mastectomy scar with no signs of recurrence. Right  breast with bruising in the UIQ, no palpable mass, nipple discharge, or bleeding.   ECOG = 0  0 - Asymptomatic (Fully active, able to carry on all predisease activities without restriction)  1 - Symptomatic but completely ambulatory (Restricted in physically strenuous activity but ambulatory and able to carry out work of a light or sedentary nature. For example, light housework, office work)  2 - Symptomatic, <50% in bed during the day (Ambulatory and capable of all self care but unable to carry out any work activities. Up and about more than 50% of waking hours)  3 - Symptomatic, >50% in bed, but not bedbound (Capable of only limited self-care, confined to bed or chair 50% or more of waking hours)  4 - Bedbound (Completely disabled. Cannot carry on any self-care. Totally confined to bed or chair)  5 - Death   Eustace Pen MM, Creech RH, Tormey DC, et al. [phone]). "Toxicity and response criteria of the [facility]". [place] Oncol. 5 (6): 649-55  LABORATORY DATA:  Lab Results  Component Value Date   WBC 6.0 04/25/2018   HGB 12.9 04/25/2018   HCT 37.9 04/25/2018   MCV 88.7 04/25/2018   PLT 192 04/25/2018   Lab Results  Component Value Date   NA 143 04/25/2018   K 3.7 04/25/2018   CL 109 04/25/2018   CO2 23  04/25/2018   Lab Results  Component Value Date   ALT 19 04/25/2018   AST 18 04/25/2018   ALKPHOS 60 04/25/2018   BILITOT 0.6 04/25/2018    PULMONARY FUNCTION TEST:   Recent Review Flowsheet Data    There is no flowsheet data to display.      RADIOGRAPHY: US Breast Ltd Uni Right Inc Axilla  Result Date: 04/16/2018 CLINICAL DATA:  Patient was called back from screening mammogram for a possible mass in the right breast. EXAM: DIGITAL DIAGNOSTIC RIGHT MAMMOGRAM WITH TOMO ULTRASOUND RIGHT BREAST COMPARISON:  Previous exam(s). ACR Breast Density Category c: The breast tissue is heterogeneously dense, which may obscure small masses. FINDINGS: Additional  imaging of the right breast was performed showing persistence of a spiculated 5 mm mass in the upper inner quadrant of the right breast, middle depth. There are no malignant type microcalcifications. On physical exam, no mass is palpated in the upper-inner quadrant of the right breast. Targeted ultrasound is performed, showing a spiculated hypoechoic mass in the right breast at 1 o'clock 7 cm from the nipple measuring 5 x 5 x 6 mm. Sonographic evaluation the right axilla does not show any enlarged adenopathy. IMPRESSION: Suspicious right breast mass. RECOMMENDATION: Ultrasound-guided core biopsy of the right breast mass is recommended and will be scheduled at the patient's convenience. I have discussed the findings and recommendations with the patient. Results were also provided in writing at the conclusion of the visit. If applicable, a reminder letter will be sent to the patient regarding the next appointment. BI-RADS CATEGORY  5: Highly suggestive of malignancy. Electronically Signed   By: Lillia Mountain M.D.   On: 04/16/2018 14:07   Mm Diag Breast Tomo Uni Right  Result Date: 04/16/2018 CLINICAL DATA:  Patient was called back from screening mammogram for a possible mass in the right breast. EXAM: DIGITAL DIAGNOSTIC RIGHT MAMMOGRAM WITH TOMO ULTRASOUND RIGHT BREAST COMPARISON:  Previous exam(s). ACR Breast Density Category c: The breast tissue is heterogeneously dense, which may obscure small masses. FINDINGS: Additional imaging of the right breast was performed showing persistence of a spiculated 5 mm mass in the upper inner quadrant of the right breast, middle depth. There are no malignant type microcalcifications. On physical exam, no mass is palpated in the upper-inner quadrant of the right breast. Targeted ultrasound is performed, showing a spiculated hypoechoic mass in the right breast at 1 o'clock 7 cm from the nipple measuring 5 x 5 x 6 mm. Sonographic evaluation the right axilla does not show any  enlarged adenopathy. IMPRESSION: Suspicious right breast mass. RECOMMENDATION: Ultrasound-guided core biopsy of the right breast mass is recommended and will be scheduled at the patient's convenience. I have discussed the findings and recommendations with the patient. Results were also provided in writing at the conclusion of the visit. If applicable, a reminder letter will be sent to the patient regarding the next appointment. BI-RADS CATEGORY  5: Highly suggestive of malignancy. Electronically Signed   By: Lillia Mountain M.D.   On: 04/16/2018 14:07   Mm 3d Screen Breast Uni Right  Result Date: 04/06/2018 CLINICAL DATA:  Screening. EXAM: DIGITAL SCREENING UNILATERAL RIGHT MAMMOGRAM WITH CAD AND TOMO COMPARISON:  Previous exam(s). ACR Breast Density Category c: The breast tissue is heterogeneously dense, which may obscure small masses. FINDINGS: In the right breast, a possible mass warrants further evaluation. Images were processed with CAD. IMPRESSION: Further evaluation is suggested for possible mass in the right breast. RECOMMENDATION: Diagnostic mammogram and possibly ultrasound  of the right breast. (Code:[id number]) The patient will be contacted regarding the findings, and additional imaging will be scheduled. BI-RADS CATEGORY  0: Incomplete. Need additional imaging evaluation and/or prior mammograms for comparison. Electronically Signed   By: Lajean Manes M.D.   On: 04/06/2018 16:04   Mm Clip Placement Right  Result Date: 04/17/2018 CLINICAL DATA:  Patient status post ultrasound-guided biopsy right breast mass 1 o'clock position. EXAM: DIAGNOSTIC RIGHT MAMMOGRAM POST ULTRASOUND BIOPSY COMPARISON:  Previous exam(s). FINDINGS: Mammographic images were obtained following ultrasound guided biopsy of right breast mass 1 o'clock position. Ribbon shaped marking clip in appropriate position. IMPRESSION: Appropriate position ribbon shaped marking clip status post ultrasound-guided biopsy right breast mass. Final  Assessment: Post Procedure Mammograms for Marker Placement Electronically Signed   By: Lovey Newcomer M.D.   On: 04/17/2018 14:44   [place] Rt Breast Bx W Loc Dev 1st Lesion Img Bx Spec US Guide  Addendum Date: 04/19/2018   ADDENDUM REPORT: 04/19/2018 07:18 ADDENDUM: Pathology revealed GRADE I INVASIVE DUCTAL CARCINOMA, DUCTAL CARCINOMA IN SITU of the Right breast, 1 o'clock. This was found to be concordant by Dr. Lovey Newcomer. Pathology results were discussed with the patient by telephone. The patient reported doing well after the biopsy with tenderness at the site. Post biopsy instructions and care were reviewed and questions were answered. The patient was encouraged to call [facility] for any additional concerns. The patient was referred to [facility] on April 25, 2018. Pathology results reported by Terie Purser, RN on 04/19/2018. Electronically Signed   By: Lovey Newcomer M.D.   On: 04/19/2018 07:18   Result Date: 04/19/2018 CLINICAL DATA:  Patient with indeterminate right breast mass. EXAM: ULTRASOUND GUIDED RIGHT BREAST CORE NEEDLE BIOPSY COMPARISON:  Previous exam(s). FINDINGS: I met with the patient and we discussed the procedure of ultrasound-guided biopsy, including benefits and alternatives. We discussed the high likelihood of a successful procedure. We discussed the risks of the procedure, including infection, bleeding, tissue injury, clip migration, and inadequate sampling. Informed written consent was given. The usual time-out protocol was performed immediately prior to the procedure. Lesion quadrant: Upper inner quadrant Using sterile technique and 1% Lidocaine as local anesthetic, under direct ultrasound visualization, a 12 gauge spring-loaded device was used to perform biopsy of right breast mass 1 o'clock position using a lateral approach. At the conclusion of the procedure a ribbon shaped tissue  marker clip was deployed into the biopsy cavity. Follow up 2 view mammogram was performed and dictated separately. IMPRESSION: Ultrasound guided biopsy of right breast mass 1 o'clock position. No apparent complications. Electronically Signed: By: Lovey Newcomer M.D. On: 04/17/2018 14:45      IMPRESSION: Stage IA Right Breast UIQ Invasive Ductal Carcinoma with DCIS, ER+ / PR+ / Her2-, Grade 1   Patient has an excellent prognosis. She will likely do well with hormonal therapy alone or radiation therapy alone. If she wishes to be aggressive, she may pursue both post-operatively. At this point, the patient feels comfortable proceeding with adjuvant hormonal therapy alone.   PLAN:  1. Genetics - scheduled for 05/21/18 at 10 am 2. Right Lumpectomy procedure 3. Possible Adjuvant Radiation Therapy 4. Aromatase Inhibitor  ------------------------------------------------  Blair Promise, PhD, MD  This document serves as a record of services personally performed by [name], MD. It was created on his behalf by Wilburn Mylar, a trained medical scribe. The creation of this record  is based on the scribe's personal observations and the provider's statements to them. This document has been checked and approved by the attending provider.

## 2018-04-25 NOTE — Assessment & Plan Note (Signed)
04/17/2018: Screening detected right breast mass, by ultrasound measured 5 mm UIQ middle depth, biopsy revealed grade 1 IDC with DCIS, ER 100%, PR 100%, HER-2 -1+ by IHC, T1 a N0 stage I a clinical stage  Pathology and radiology counseling: Discussed with the patient, the details of pathology including the type of breast cancer,the clinical staging, the significance of ER, PR and HER-2/neu receptors and the implications for treatment. After reviewing the pathology in detail, we proceeded to discuss the different treatment options between surgery, radiation,  antiestrogen therapies.  Recommendation: 1.  Breast conserving surgery without sentinel lymph nodes 2. plus/minus adjuvant radiation 3.  Antiestrogen therapy with letrozole 2.5 mg x 5 years  Return to clinic after surgery to discuss final pathology report and to initiate antiestrogen therapy

## 2018-04-25 NOTE — Telephone Encounter (Signed)
Per 9/18 los, no new orders.

## 2018-04-25 NOTE — Progress Notes (Signed)
[place] CONSULT NOTE  Patient Care Team: [name], DO as PCP - General (Geriatric Medicine) [name], MD as PCP - Cardiology (Cardiology) [name], MD as Consulting Physician (General Surgery) [name], MD as Consulting Physician (Hematology and Oncology) [name], MD as Consulting Physician (Radiation Oncology)  CHIEF COMPLAINTS/PURPOSE OF CONSULTATION:  Newly diagnosed breast cancer  HISTORY OF PRESENTING ILLNESS:  Rebecca Guzman 84 y.o. female is here because of recent diagnosis of right breast cancer.  Patient had a screening mammogram that detected a right breast mass.  By ultrasound measured 5 mm.  Biopsy which came back as IDC with DCIS that was grade 1 ER PR positive HER-2 negative and Ki-67 of 1%.  Patient was discussed in the multidisciplinary tumor board and she is here today at the [facility] clinic to discuss her treatment plan.  She is in excellent health and appears to be in good physical shape.  She had a prior history of breast cancer in the left breast in 1992 underwent left mastectomy followed by chemotherapy and 5 years of tamoxifen.  I reviewed her records extensively and collaborated the history with the patient.  SUMMARY OF ONCOLOGIC HISTORY:   Malignant neoplasm of upper-inner quadrant of right breast in female, estrogen receptor positive ([place])   1992 Miscellaneous    Left breast cancer treated with mastectomy followed by adjuvant chemotherapy and 5 years of tamoxifen    04/17/2018 Initial Diagnosis    Screening detected right breast mass, by ultrasound measured 5 mm UIQ middle depth, biopsy revealed grade 1 IDC with DCIS, ER 100%, PR 100%, HER-2 -1+ by IHC, T1 a N0 stage I a clinical stage    MEDICAL HISTORY:  Past Medical History:  Diagnosis Date  . Asthma   . BPPV (benign paroxysmal positional vertigo)   . Compression fracture of L3 lumbar vertebra 04/06/2015  . GERD (gastroesophageal reflux disease)   . Hiatal  hernia   . History of adenomatous polyp of colon    tubular adenoma 2005 and  2014 tubular adenoma and  hyperplastic polyp  . History of breast cancer per pt no recurrence   dx 1992 --  s/p  left breast mastectomy and chemotherapy  . Hyperlipidemia LDL goal <100   . Osteoarthritis of right knee   . Personal history of chemotherapy 1992  . PONV (postoperative nausea and vomiting)    and "gets weak and light-leaded"  . Sensation of pressure in bladder area   . Vaginal vault prolapse    anterior  . Wears glasses   . Wears hearing aid    left only    SURGICAL HISTORY: Past Surgical History:  Procedure Laterality Date  . CATARACT EXTRACTION W/ INTRAOCULAR LENS  IMPLANT, BILATERAL  2014  . CHOLECYSTECTOMY  1985  . COLONOSCOPY  last one 06-04-2013  . CYSTOCELE REPAIR N/A 02/29/2016   Procedure: ANTERIOR VAULT PROLAPSE REPAIR COLOPLAST [place] FIXATION AUGMENTED AXIS DERMIS REPAIR  ;  Surgeon: Carolan Clines, MD;  Location: [facility];  Service: Urology;  Laterality: N/A;  . ESOPHAGOGASTRODUODENOSCOPY  08-31-2009  . LAPAROSCOPIC ASSISTED VAGINAL HYSTERECTOMY  01-10-2000   w/ Bilateral Salpingoophorectomy /  Anterior & Posterior repair/  Pubovaginal Sling  . MASTECTOMY Left 1992    SOCIAL HISTORY: Social History   Socioeconomic History  . Marital status: Widowed    Spouse name: Not on file  . Number of children: 4  . Years of education: Not on file  . Highest education  level: Not on file  Occupational History  . Occupation: Retired Product manager: RETIRED  Social Needs  . Financial resource strain: Not on file  . Food insecurity:    Worry: Not on file    Inability: Not on file  . Transportation needs:    Medical: Not on file    Non-medical: Not on file  Tobacco Use  . Smoking status: Former Smoker    Years: 13.00    Types: Cigarettes    Last attempt to quit: 10/12/1983    Years since quitting: 34.5  . Smokeless tobacco: Never Used  Substance  and Sexual Activity  . Alcohol use: No  . Drug use: No  . Sexual activity: Not on file  Lifestyle  . Physical activity:    Days per week: Not on file    Minutes per session: Not on file  . Stress: Not on file  Relationships  . Social connections:    Talks on phone: Not on file    Gets together: Not on file    Attends religious service: Not on file    Active member of club or organization: Not on file    Attends meetings of clubs or organizations: Not on file    Relationship status: Not on file  . Intimate partner violence:    Fear of current or ex partner: Not on file    Emotionally abused: Not on file    Physically abused: Not on file    Forced sexual activity: Not on file  Other Topics Concern  . Not on file  Social History Narrative  . Not on file    FAMILY HISTORY: Family History  Problem Relation Age of Onset  . Heart attack Mother   . Colon cancer Mother   . Hypertension Mother   . Heart disease Father   . Breast cancer Sister        half sister    ALLERGIES:  is allergic to codeine and morphine and related.  MEDICATIONS:  Current Outpatient Medications  Medication Sig Dispense Refill  . acetaminophen (TYLENOL) 500 MG tablet Take 1,000 mg by mouth every morning.     . Ca Carbonate-Mag Hydroxide (ROLAIDS PO) Take 2 tablets by mouth daily as needed (indigestion).    . Calcium Citrate-Vitamin D (CITRACAL + D PO) Take 2 tablets by mouth daily.    Marland Kitchen loratadine (CLARITIN) 10 MG tablet Take 10 mg by mouth daily.    . metoprolol tartrate (LOPRESSOR) 25 MG tablet Take 1 tablet (25 mg total) by mouth 2 (two) times daily. 180 tablet 3  . Multiple Vitamin (MULTIVITAMIN) tablet Take 1 tablet by mouth daily.    . Omega-3 Fatty Acids (FISH OIL) 1000 MG CAPS Take 1 capsule (1,000 mg total) by mouth daily.  0  . omeprazole (PRILOSEC) 40 MG capsule TAKE 1 CAPSULE BY MOUTH TWICE A DAY 60 capsule 0  . pravastatin (PRAVACHOL) 40 MG tablet TAKE 1 TABLET BY MOUTH ONCE DAILY 90  tablet 1  . Probiotic Product (PROBIOTIC FORMULA PO) Take 1 tablet by mouth every evening.      No current facility-administered medications for this visit.     REVIEW OF SYSTEMS:   Constitutional: Denies fevers, chills or abnormal night sweats Eyes: Denies blurriness of vision, double vision or watery eyes Ears, nose, mouth, throat, and face: Denies mucositis or sore throat Respiratory: Denies cough, dyspnea or wheezes Cardiovascular: Denies palpitation, chest discomfort or lower extremity swelling Gastrointestinal:  Denies nausea, heartburn  or change in bowel habits Skin: Denies abnormal skin rashes Lymphatics: Denies new lymphadenopathy or easy bruising Neurological:Denies numbness, tingling or new weaknesses Behavioral/Psych: Mood is stable, no new changes  Breast: Bruising from recent biopsy right breast All other systems were reviewed with the patient and are negative.  PHYSICAL EXAMINATION: ECOG PERFORMANCE STATUS: 1 - Symptomatic but completely ambulatory  Vitals:   04/25/18 0849  BP: (!) 149/84  Pulse: 87  Resp: 17  Temp: 98.2 F (36.8 C)  SpO2: 98%   Filed Weights   04/25/18 0849  Weight: 174 lb 1.6 oz (79 kg)    GENERAL:alert, no distress and comfortable SKIN: skin color, texture, turgor are normal, no rashes or significant lesions EYES: normal, conjunctiva are pink and non-injected, sclera clear OROPHARYNX:no exudate, no erythema and lips, buccal mucosa, and tongue normal  NECK: supple, thyroid normal size, non-tender, without nodularity LYMPH:  no palpable lymphadenopathy in the cervical, axillary or inguinal LUNGS: clear to auscultation and percussion with normal breathing effort HEART: regular rate & rhythm and no murmurs and no lower extremity edema ABDOMEN:abdomen soft, non-tender and normal bowel sounds Musculoskeletal:no cyanosis of digits and no clubbing  PSYCH: alert & oriented x 3 with fluent speech NEURO: no focal motor/sensory deficits BREAST:  Right breast bruising related to prior biopsy. no palpable axillary or supraclavicular lymphadenopathy (exam performed in the presence of a chaperone)   LABORATORY DATA:  I have reviewed the data as listed Lab Results  Component Value Date   WBC 6.0 04/25/2018   HGB 12.9 04/25/2018   HCT 37.9 04/25/2018   MCV 88.7 04/25/2018   PLT 192 04/25/2018   Lab Results  Component Value Date   NA 143 04/25/2018   K 3.7 04/25/2018   CL 109 04/25/2018   CO2 23 04/25/2018    RADIOGRAPHIC STUDIES: I have personally reviewed the radiological reports and agreed with the findings in the report.  ASSESSMENT AND PLAN:  Malignant neoplasm of upper-inner quadrant of right breast in female, estrogen receptor positive ([place]) 04/17/2018: Screening detected right breast mass, by ultrasound measured 5 mm UIQ middle depth, biopsy revealed grade 1 IDC with DCIS, ER 100%, PR 100%, HER-2 -1+ by IHC, T1 a N0 stage I a clinical stage  Pathology and radiology counseling: Discussed with the patient, the details of pathology including the type of breast cancer,the clinical staging, the significance of ER, PR and HER-2/neu receptors and the implications for treatment. After reviewing the pathology in detail, we proceeded to discuss the different treatment options between surgery, radiation,  antiestrogen therapies.  Recommendation: 1.  Breast conserving surgery without sentinel lymph nodes 2. plus/minus adjuvant radiation 3.  Antiestrogen therapy with letrozole 2.5 mg x 5 years  Return to clinic after surgery to discuss final pathology report and to initiate antiestrogen therapy    All questions were answered. The patient knows to call the clinic with any problems, questions or concerns.    Harriette Ohara, MD 04/25/18

## 2018-04-25 NOTE — Progress Notes (Signed)
Clinical Social Work [place] Psychosocial Distress Screening [place]  Patient completed distress screening protocol and scored a 0 on the Psychosocial Distress Thermometer which indicates no distress. Clinical Social Worker met with patient and patients family in [facility] to assess for distress and other psychosocial needs. Patient stated she was feeling overwhelmed but felt "better" after meeting with the treatment team and getting more information on her treatment plan. CSW and patient discussed common feeling and emotions when being diagnosed with cancer, and the importance of support during treatment. CSW informed patient of the support team and support services at [facility] CSW provided contact information and encouraged patient to call with any questions or concerns.  ONCBCN DISTRESS SCREENING 04/25/2018  Screening Type Initial Screening  Distress experienced in past week (1-10) 0     Johnnye Lana, MSW, LCSW, OSW-C Clinical Social Worker [facility] [phone]

## 2018-04-26 ENCOUNTER — Other Ambulatory Visit: Payer: Medicare Other

## 2018-04-30 ENCOUNTER — Ambulatory Visit: Payer: Medicare Other

## 2018-04-30 ENCOUNTER — Ambulatory Visit (INDEPENDENT_AMBULATORY_CARE_PROVIDER_SITE_OTHER): Payer: Medicare Other | Admitting: Internal Medicine

## 2018-04-30 ENCOUNTER — Encounter: Payer: Self-pay | Admitting: Internal Medicine

## 2018-04-30 VITALS — BP 138/78 | HR 76 | Temp 98.3°F | Ht 65.0 in | Wt 173.0 lb

## 2018-04-30 DIAGNOSIS — N8110 Cystocele, unspecified: Secondary | ICD-10-CM

## 2018-04-30 DIAGNOSIS — Z23 Encounter for immunization: Secondary | ICD-10-CM

## 2018-04-30 DIAGNOSIS — M5136 Other intervertebral disc degeneration, lumbar region: Secondary | ICD-10-CM | POA: Diagnosis not present

## 2018-04-30 DIAGNOSIS — Z0000 Encounter for general adult medical examination without abnormal findings: Secondary | ICD-10-CM

## 2018-04-30 DIAGNOSIS — M810 Age-related osteoporosis without current pathological fracture: Secondary | ICD-10-CM | POA: Diagnosis not present

## 2018-04-30 DIAGNOSIS — C50211 Malignant neoplasm of upper-inner quadrant of right female breast: Secondary | ICD-10-CM | POA: Diagnosis not present

## 2018-04-30 DIAGNOSIS — Z170 Estrogen receptor positive status [ER+]: Secondary | ICD-10-CM

## 2018-04-30 DIAGNOSIS — R739 Hyperglycemia, unspecified: Secondary | ICD-10-CM

## 2018-04-30 DIAGNOSIS — E785 Hyperlipidemia, unspecified: Secondary | ICD-10-CM

## 2018-04-30 NOTE — Progress Notes (Signed)
Provider:  Rexene Edison. Mariea Clonts, D.O., C.M.D. Location:   [facility]  Place of Service:   clinic  Previous PCP: [name], DO Patient Care Team: [name], DO as PCP - General (Geriatric Medicine) [name], MD as PCP - Cardiology (Cardiology) [name], MD as Consulting Physician (General Surgery) [name], MD as Consulting Physician (Hematology and Oncology) [name], MD as Consulting Physician (Radiation Oncology)  Extended Emergency Contact Information Primary Emergency Contact: [name],[name] Address: [address]          [place], [place] [address] [name] of [place] Phone: [phone] Mobile Phone: [phone] Relation: Daughter Secondary Emergency Contact: [name],[name] Address: [address]           [place], [place] [address] [place] of [place] Phone: [phone] Mobile Phone: [phone] Relation: Daughter  Code Status: DNR Goals of Care: Advanced Directive information Advanced Directives 04/30/2018  Does Patient Have a Medical Advance Directive? Yes  Type of Paramedic of [place];Living will;Out of facility DNR (pink MOST or yellow form)  Does patient want to make changes to medical advance directive? No - Patient declined  Copy of [place] in Chart? No - copy requested  Would patient like information on creating a medical advance directive? -  Pre-existing out of facility DNR order (yellow form or pink MOST form) Yellow form placed in chart (order not valid for inpatient use)   Chief Complaint  Patient presents with  . Annual Exam    CPE    HPI: Patient is a 84 y.o. female seen today for an annual physical exam.  Had abnormal mammogram  She's waiting on the surgeon's office to being treatments--she is for breast conserving surgery without sentinel lymph nodes.   She does not need chemo or radiation.   She is going to get genetic testing done.   Her  biopsy showed ER+, PR+, HER2 neg  She is to continue her prolia.   She occasionally has to get her steroid shot for her hip pain.  Also ok to continue.    She is doing well overall.  Staying as active as can be.  She's thankful she does not need chemo. She had it with her breast cancer before and did chemo when she was working.  Did well with it then.  Has terrible bruising where she had the breast biopsy.    We will check her bone density after she's through with her cancer treatments Also we will plan on a cscope as she wishes due to family history.  Past Medical History:  Diagnosis Date  . Asthma   . BPPV (benign paroxysmal positional vertigo)   . Compression fracture of L3 lumbar vertebra 04/06/2015  . GERD (gastroesophageal reflux disease)   . Hiatal hernia   . History of adenomatous polyp of colon    tubular adenoma 2005 and  2014 tubular adenoma and  hyperplastic polyp  . History of breast cancer per pt no recurrence   dx 1992 --  s/p  left breast mastectomy and chemotherapy  . Hyperlipidemia LDL goal <100   . Osteoarthritis of right knee   . Personal history of chemotherapy 1992  . PONV (postoperative nausea and vomiting)    and "gets weak and light-leaded"  . Sensation of pressure in bladder area   . Vaginal vault prolapse    anterior  . Wears glasses   . Wears  hearing aid    left only   Past Surgical History:  Procedure Laterality Date  . CATARACT EXTRACTION W/ INTRAOCULAR LENS  IMPLANT, BILATERAL  2014  . CHOLECYSTECTOMY  1985  . COLONOSCOPY  last one 06-04-2013  . CYSTOCELE REPAIR N/A 02/29/2016   Procedure: ANTERIOR VAULT PROLAPSE REPAIR COLOPLAST [place] FIXATION AUGMENTED AXIS DERMIS REPAIR  ;  Surgeon: Carolan Clines, MD;  Location: [facility];  Service: Urology;  Laterality: N/A;  . ESOPHAGOGASTRODUODENOSCOPY  08-31-2009  . LAPAROSCOPIC ASSISTED VAGINAL HYSTERECTOMY  01-10-2000   w/ Bilateral Salpingoophorectomy /  Anterior & Posterior  repair/  Pubovaginal Sling  . MASTECTOMY Left 1992    reports that she quit smoking about 34 years ago. Her smoking use included cigarettes. She quit after 13.00 years of use. She has never used smokeless tobacco. She reports that she does not drink alcohol or use drugs.  Functional Status Survey:    Family History  Problem Relation Age of Onset  . Heart attack Mother   . Colon cancer Mother   . Hypertension Mother   . Heart disease Father   . Breast cancer Sister        half sister    Health Maintenance  Topic Date Due  . INFLUENZA VACCINE  03/08/2018  . TETANUS/TDAP  01/25/2025  . DEXA SCAN  Completed  . PNA vac Low Risk Adult  Completed    Allergies  Allergen Reactions  . Codeine Nausea And Vomiting  . Morphine And Related Other (See Comments)    Nausea, and drop in blood pressure    Outpatient Encounter Medications as of 04/30/2018  Medication Sig  . acetaminophen (TYLENOL) 500 MG tablet Take 1,000 mg by mouth every morning.   . Ca Carbonate-Mag Hydroxide (ROLAIDS PO) Take 2 tablets by mouth daily as needed (indigestion).  . Calcium Citrate-Vitamin D (CITRACAL + D PO) Take 2 tablets by mouth daily.  Marland Kitchen loratadine (CLARITIN) 10 MG tablet Take 10 mg by mouth daily.  . metoprolol tartrate (LOPRESSOR) 25 MG tablet Take 1 tablet (25 mg total) by mouth 2 (two) times daily.  . Multiple Vitamin (MULTIVITAMIN) tablet Take 1 tablet by mouth daily.  . Omega-3 Fatty Acids (FISH OIL) 1000 MG CAPS Take 1 capsule (1,000 mg total) by mouth daily.  Marland Kitchen omeprazole (PRILOSEC) 40 MG capsule TAKE 1 CAPSULE BY MOUTH TWICE A DAY  . pravastatin (PRAVACHOL) 40 MG tablet TAKE 1 TABLET BY MOUTH ONCE DAILY  . Probiotic Product (PROBIOTIC FORMULA PO) Take 1 tablet by mouth every evening.    No facility-administered encounter medications on file as of 04/30/2018.     Review of Systems  Constitutional: Negative for chills, fever, malaise/fatigue and weight loss.       Wt stable  HENT: Positive  for hearing loss. Negative for congestion.        Left hearing aid; postnasal drip  Eyes: Negative for blurred vision.       Glasses  Respiratory: Negative for cough and shortness of breath.   Cardiovascular: Negative for chest pain, palpitations and leg swelling.  Gastrointestinal: Negative for abdominal pain, blood in stool, constipation, diarrhea and melena.  Genitourinary: Negative for dysuria.       Using pessary with benefit; only up once to urinate at night  Musculoskeletal: Negative for falls and joint pain.  Skin: Negative for itching and rash.  Neurological: Negative for dizziness and loss of consciousness.  Endo/Heme/Allergies: Does not bruise/bleed easily.  Psychiatric/Behavioral: Negative for depression and memory loss.  The patient is not nervous/anxious and does not have insomnia.     Vitals:   04/30/18 0847  BP: 138/78  Pulse: 76  Temp: 98.3 F (36.8 C)  TempSrc: Oral  SpO2: 97%  Weight: 173 lb (78.5 kg)  Height: '5\' 5"'  (1.651 m)   Body mass index is 28.79 kg/m. Physical Exam  Constitutional: She is oriented to person, place, and time. She appears well-developed and well-nourished. No distress.  HENT:  Head: Normocephalic and atraumatic.  Right Ear: External ear normal.  Left Ear: External ear normal.  Nose: Nose normal.  Mouth/Throat: Oropharynx is clear and moist. No oropharyngeal exudate.  Eyes: Pupils are equal, round, and reactive to light. Conjunctivae and EOM are normal.  Neck: Normal range of motion. Neck supple. No JVD present. No tracheal deviation present. No thyromegaly present.  Cardiovascular: Normal rate, regular rhythm, normal heart sounds and intact distal pulses.  Pulmonary/Chest: Effort normal and breath sounds normal. Right breast exhibits tenderness.  Ecchymoses right breast/hematoma  Abdominal: Soft. Bowel sounds are normal. She exhibits no distension. There is no tenderness.  Musculoskeletal: Normal range of motion. She exhibits  tenderness.  Over right SI region and lower lumbar spine, scoliosis  Lymphadenopathy:    She has no cervical adenopathy.  Neurological: She is alert and oriented to person, place, and time.  Skin: Skin is warm and dry. Capillary refill takes less than 2 seconds.  Psychiatric: She has a normal mood and affect. Her behavior is normal. Judgment and thought content normal.    Labs reviewed: Basic Metabolic Panel: Recent Labs    10/19/17 0809 04/25/18 0820  NA 140 143  K 4.0 3.7  CL 104 109  CO2 27 23  GLUCOSE 98 118*  BUN 18 15  CREATININE 0.83 0.84  CALCIUM 9.6 9.4   Liver Function Tests: Recent Labs    10/19/17 0809 04/25/18 0820  AST 17 18  ALT 16 19  ALKPHOS  --  60  BILITOT 0.5 0.6  PROT 6.5 6.9  ALBUMIN  --  3.9   No results for input(s): LIPASE, AMYLASE in the last 8760 hours. No results for input(s): AMMONIA in the last 8760 hours. CBC: Recent Labs    10/19/17 0809 04/25/18 0820  WBC 7.8 6.0  NEUTROABS 4,883 3.9  HGB 13.4 12.9  HCT 39.3 37.9  MCV 90.6 88.7  PLT 201 192   Cardiac Enzymes: No results for input(s): CKTOTAL, CKMB, CKMBINDEX, TROPONINI in the last 8760 hours. BNP: Invalid input(s): POCBNP Lab Results  Component Value Date   HGBA1C 5.4 10/19/2017   Lab Results  Component Value Date   TSH 2.820 10/31/2016   Lab Results  Component Value Date   VITAMINB12 596 10/19/2017   Lab Results  Component Value Date   FOLATE >19.9 01/15/2014   Imaging and Procedures Recently: Reviewed mammograms, ultrasounds, biopsy/path reports  Assessment/Plan ANNUAL EXAM -CPE performed today -had AWV with RN also -flu shot was given with her  1. DDD (degenerative disc disease), lumbar -stable, gets injections occasionally when this gets severe  2. Senile osteoporosis -cont prolia as this may worsen in context of letrozole therapy -also cont calcium with D -recommend she also take D3 2000 units daily  3. Malignant neoplasm of upper-inner  quadrant of right breast in female, estrogen receptor positive (Rebecca Guzman) -for breast conservation surgery with Dr. Barry Dienes and then letrozole 2.14m for 5 years with Dr. GLindi Adie 4. Hyperlipidemia LDL goal <100 - cont pravachol therapy, LDL has improved, TG also  better with fish oil - Lipid panel  5. Bladder prolapse, female, acquired -cont pessary which has helped her incontinence and difficulty urinating in the daytime -had prior sling before than that was ineffective after a short time  6. Hyperglycemia - resolved with weight loss and walking - Hemoglobin A1c  Labs/tests ordered:   Orders Placed This Encounter  Procedures  . Hemoglobin A1c  . Lipid panel   F/u in 6 mos med mgt  Rebecca Guzman L. Hatice Bubel, D.O. [facility] [address], [place] [address] Cell Phone (Mon-Fri 8am-5pm):  [phone] On Call:  [phone] & follow prompts after 5pm & weekends Office Phone:  [phone] Office Fax:  [phone]

## 2018-04-30 NOTE — Progress Notes (Signed)
Subjective:   Rebecca Guzman is a 83 y.o. female who presents for Medicare Annual (Subsequent) preventive examination.  Last AWV-04/03/2017    Objective:     Vitals: BP 138/78 (BP Location: Right Arm, Patient Position: Sitting)   Pulse 76   Temp 98.3 F (36.8 C) (Oral)   Ht 5\' 5"  (1.651 m)   Wt 173 lb (78.5 kg)   LMP 11/21/1985   SpO2 97%   BMI 28.79 kg/m   Body mass index is 28.79 kg/m.  Advanced Directives 04/30/2018 10/23/2017 04/03/2017 09/29/2016 05/30/2016 02/29/2016 02/29/2016  Does Patient Have a Medical Advance Directive? Yes Yes Yes Yes Yes No No  Type of Paramedic of [place];Living will;Out of facility DNR (pink MOST or yellow form) [place];Out of facility DNR (pink MOST or yellow form) Out of facility DNR (pink MOST or yellow form) Out of facility DNR (pink MOST or yellow form) Out of facility DNR (pink MOST or yellow form) - -  Does patient want to make changes to medical advance directive? No - Patient declined No - Patient declined No - Patient declined - - - -  Copy of [place] in Chart? No - copy requested Yes - - Yes - -  Would patient like information on creating a medical advance directive? - - - - - No - patient declined information No - patient declined information  Pre-existing out of facility DNR order (yellow form or pink MOST form) Yellow form placed in chart (order not valid for inpatient use) Yellow form placed in chart (order not valid for inpatient use) Yellow form placed in chart (order not valid for inpatient use);Pink MOST form placed in chart (order not valid for inpatient use) Yellow form placed in chart (order not valid for inpatient use) Yellow form placed in chart (order not valid for inpatient use) - -    Tobacco Social History   Tobacco Use  Smoking Status Former Smoker  . Years: 13.00  . Types: Cigarettes  . Last attempt to quit: 10/12/1983  . Years since quitting: 34.5    Smokeless Tobacco Never Used     Counseling given: Not Answered   Clinical Intake:  Pre-visit preparation completed: No  Pain : No/denies pain     Nutritional Risks: Other (Comment) Diabetes: No  How often do you need to have someone help you when you read instructions, pamphlets, or other written materials from your doctor or pharmacy?: 1 - Never What is the last grade level you completed in school?: 1 year of college  Interpreter Needed?: No  Information entered by :: Tyson Dense, RN  Past Medical History:  Diagnosis Date  . Asthma   . BPPV (benign paroxysmal positional vertigo)   . Compression fracture of L3 lumbar vertebra 04/06/2015  . GERD (gastroesophageal reflux disease)   . Hiatal hernia   . History of adenomatous polyp of colon    tubular adenoma 2005 and  2014 tubular adenoma and  hyperplastic polyp  . History of breast cancer per pt no recurrence   dx 1992 --  s/p  left breast mastectomy and chemotherapy  . Hyperlipidemia LDL goal <100   . Osteoarthritis of right knee   . Personal history of chemotherapy 1992  . PONV (postoperative nausea and vomiting)    and "gets weak and light-leaded"  . Sensation of pressure in bladder area   . Vaginal vault prolapse    anterior  . Wears glasses   .  Wears hearing aid    left only   Past Surgical History:  Procedure Laterality Date  . CATARACT EXTRACTION W/ INTRAOCULAR LENS  IMPLANT, BILATERAL  2014  . CHOLECYSTECTOMY  1985  . COLONOSCOPY  last one 06-04-2013  . CYSTOCELE REPAIR N/A 02/29/2016   Procedure: ANTERIOR VAULT PROLAPSE REPAIR COLOPLAST [place] FIXATION AUGMENTED AXIS DERMIS REPAIR  ;  Surgeon: Carolan Clines, MD;  Location: [facility];  Service: Urology;  Laterality: N/A;  . ESOPHAGOGASTRODUODENOSCOPY  08-31-2009  . LAPAROSCOPIC ASSISTED VAGINAL HYSTERECTOMY  01-10-2000   w/ Bilateral Salpingoophorectomy /  Anterior & Posterior repair/  Pubovaginal Sling  . MASTECTOMY Left  1992   Family History  Problem Relation Age of Onset  . Heart attack Mother   . Colon cancer Mother   . Hypertension Mother   . Heart disease Father   . Breast cancer Sister        half sister   Social History   Socioeconomic History  . Marital status: Widowed    Spouse name: Not on file  . Number of children: 4  . Years of education: Not on file  . Highest education level: Not on file  Occupational History  . Occupation: Retired Product manager: RETIRED  Social Needs  . Financial resource strain: Not hard at all  . Food insecurity:    Worry: Never true    Inability: Never true  . Transportation needs:    Medical: No    Non-medical: No  Tobacco Use  . Smoking status: Former Smoker    Years: 13.00    Types: Cigarettes    Last attempt to quit: 10/12/1983    Years since quitting: 34.5  . Smokeless tobacco: Never Used  Substance and Sexual Activity  . Alcohol use: No  . Drug use: No  . Sexual activity: Not on file  Lifestyle  . Physical activity:    Days per week: 6 days    Minutes per session: 30 min  . Stress: Only a little  Relationships  . Social connections:    Talks on phone: More than three times a week    Gets together: More than three times a week    Attends religious service: More than 4 times per year    Active member of club or organization: No    Attends meetings of clubs or organizations: Never    Relationship status: Widowed  Other Topics Concern  . Not on file  Social History Narrative  . Not on file    Outpatient Encounter Medications as of 04/30/2018  Medication Sig  . acetaminophen (TYLENOL) 500 MG tablet Take 1,000 mg by mouth every morning.   . Ca Carbonate-Mag Hydroxide (ROLAIDS PO) Take 2 tablets by mouth daily as needed (indigestion).  . Calcium Citrate-Vitamin D (CITRACAL + D PO) Take 2 tablets by mouth daily.  Marland Kitchen loratadine (CLARITIN) 10 MG tablet Take 10 mg by mouth daily.  . metoprolol tartrate (LOPRESSOR) 25 MG tablet Take 1  tablet (25 mg total) by mouth 2 (two) times daily.  . Multiple Vitamin (MULTIVITAMIN) tablet Take 1 tablet by mouth daily.  . Omega-3 Fatty Acids (FISH OIL) 1000 MG CAPS Take 1 capsule (1,000 mg total) by mouth daily.  Marland Kitchen omeprazole (PRILOSEC) 40 MG capsule TAKE 1 CAPSULE BY MOUTH TWICE A DAY  . pravastatin (PRAVACHOL) 40 MG tablet TAKE 1 TABLET BY MOUTH ONCE DAILY  . Probiotic Product (PROBIOTIC FORMULA PO) Take 1 tablet by mouth every evening.  No facility-administered encounter medications on file as of 04/30/2018.     Activities of Daily Living In your present state of health, do you have any difficulty performing the following activities: 04/30/2018  Hearing? N  Vision? N  Difficulty concentrating or making decisions? N  Walking or climbing stairs? N  Dressing or bathing? N  Doing errands, shopping? N  Preparing Food and eating ? N  Using the Toilet? N  In the past six months, have you accidently leaked urine? Y  Comment pessury  Do you have problems with loss of bowel control? N  Managing your Medications? N  Managing your Finances? N  Housekeeping or managing your Housekeeping? N  Some recent data might be hidden    Patient Care Team: [name], DO as PCP - General (Geriatric Medicine) [name], MD as PCP - Cardiology (Cardiology) [name], MD as Consulting Physician (General Surgery) [name], MD as Consulting Physician (Hematology and Oncology) [name], MD as Consulting Physician (Radiation Oncology)    Assessment:   This is a routine wellness examination for Rebecca Guzman.  Exercise Activities and Dietary recommendations Current Exercise Habits: Structured exercise class, Type of exercise: Other - see comments;walking(water walking), Time (Minutes): 30, Frequency (Times/Week): 6, Weekly Exercise (Minutes/Week): 180, Intensity: Mild, Exercise limited by: None identified  Goals    . Increase water intake     Patient will increase intake.        Fall Risk Fall Risk  04/30/2018 10/23/2017 04/03/2017 09/29/2016 05/30/2016  Falls in the past year? No No No No No   Is the patient's home free of loose throw rugs in walkways, pet beds, electrical cords, etc?   yes      Grab bars in the bathroom? no      Handrails on the stairs?   yes      Adequate lighting?   yes  Depression Screen PHQ 2/9 Scores 04/30/2018 10/23/2017 04/03/2017 09/29/2016  PHQ - 2 Score 0 0 0 0     Cognitive Function MMSE - Mini Mental State Exam 04/30/2018 04/03/2017 01/28/2016  Orientation to time 5 5 5   Orientation to Place 5 5 5   Registration 3 3 3   Attention/ Calculation 5 5 4   Recall 2 3 3   Language- name 2 objects 2 2 2   Language- repeat 1 1 1   Language- follow 3 step command 3 3 3   Language- read & follow direction 1 1 1   Write a sentence 1 1 1   Copy design 1 1 1   Total score 29 30 29         Immunization History  Administered Date(s) Administered  . Influenza, High Dose Seasonal PF 04/03/2017, 04/30/2018  . Influenza, Quadrivalent, Recombinant, Inj, Pf 03/25/2016  . Influenza,inj,Quad PF,6+ Mos 04/11/2013  . Influenza-Unspecified 08/09/2011, 05/30/2014  . Pneumococcal Conjugate-13 07/11/2013  . Pneumococcal Polysaccharide-23 07/25/2014  . Tdap 01/26/2015  . Zoster 08/08/2010  . Zoster Recombinat (Shingrix) 04/28/2017, 10/20/2017    Qualifies for Shingles Vaccine? Up to date, completed  Screening Tests Health Maintenance  Topic Date Due  . TETANUS/TDAP  01/25/2025  . INFLUENZA VACCINE  Completed  . DEXA SCAN  Completed  . PNA vac Low Risk Adult  Completed    Cancer Screenings: Lung: Low Dose CT Chest recommended if Age 12-80 years, 30 pack-year currently smoking OR have quit w/in 15years. Patient does not qualify. Breast:  Up to date on Mammogram? Yes   Up to date of Bone Density/Dexa? No, she will get it  next year after her hormone therapy Colorectal: up to date  Additional Screenings:  Hepatitis C Screening: declined High  dose flu vaccine given today     Plan:    I have personally reviewed and addressed the Medicare Annual Wellness questionnaire and have noted the following in the patient's chart:  A. Medical and social history B. Use of alcohol, tobacco or illicit drugs  C. Current medications and supplements D. Functional ability and status E.  Nutritional status F.  Physical activity G. Advance directives H. List of other physicians I.  Hospitalizations, surgeries, and ER visits in previous 12 months J.  [place] to include hearing, vision, cognitive, depression L. Referrals and appointments - none  In addition, I have reviewed and discussed with patient certain preventive protocols, quality metrics, and best practice recommendations. A written personalized care plan for preventive services as well as general preventive health recommendations were provided to patient.  See attached scanned questionnaire for additional information.   Signed,   Tyson Dense, RN Nurse Health Advisor  Patient Concerns: None

## 2018-04-30 NOTE — Patient Instructions (Signed)
Best wishes with your surgery!  Please call me if you need anything.   Bring by your advance directive documents when you have the opportunity.

## 2018-04-30 NOTE — Patient Instructions (Addendum)
Rebecca Guzman , Thank you for taking time to come for your Medicare Wellness Visit. I appreciate your ongoing commitment to your health goals. Please review the following plan we discussed and let me know if I can assist you in the future.   Screening recommendations/referrals: Colonoscopy excluded, over age 82 Mammogram excluded, over age 18 Bone Density up to date Recommended yearly ophthalmology/optometry visit for glaucoma screening and checkup Recommended yearly dental visit for hygiene and checkup  Vaccinations: Influenza vaccine high dose given today Pneumococcal vaccine up to date, completed Tdap vaccine up to date, due 01/25/2025 Shingles vaccine up to date, completed    Advanced directives: Please bring your copy to the front desk  Conditions/risks identified: none  Next appointment: Tyson Dense, RN 05/03/2019 @ 8:30am   Preventive Care 65 Years and Older, Female Preventive care refers to lifestyle choices and visits with your health care provider that can promote health and wellness. What does preventive care include?  A yearly physical exam. This is also called an annual well check.  Dental exams once or twice a year.  Routine eye exams. Ask your health care provider how often you should have your eyes checked.  Personal lifestyle choices, including:  Daily care of your teeth and gums.  Regular physical activity.  Eating a healthy diet.  Avoiding tobacco and drug use.  Limiting alcohol use.  Practicing safe sex.  Taking low-dose aspirin every day.  Taking vitamin and mineral supplements as recommended by your health care provider. What happens during an annual well check? The services and screenings done by your health care provider during your annual well check will depend on your age, overall health, lifestyle risk factors, and family history of disease. Counseling  Your health care provider may ask you questions about your:  Alcohol use.  Tobacco  use.  Drug use.  Emotional well-being.  Home and relationship well-being.  Sexual activity.  Eating habits.  History of falls.  Memory and ability to understand (cognition).  Work and work Statistician.  Reproductive health. Screening  You may have the following tests or measurements:  Height, weight, and BMI.  Blood pressure.  Lipid and cholesterol levels. These may be checked every 5 years, or more frequently if you are over 31 years old.  Skin check.  Lung cancer screening. You may have this screening every year starting at age 94 if you have a 30-pack-year history of smoking and currently smoke or have quit within the past 15 years.  Fecal occult blood test (FOBT) of the stool. You may have this test every year starting at age 46.  Flexible sigmoidoscopy or colonoscopy. You may have a sigmoidoscopy every 5 years or a colonoscopy every 10 years starting at age 82.  Hepatitis C blood test.  Hepatitis B blood test.  Sexually transmitted disease (STD) testing.  Diabetes screening. This is done by checking your blood sugar (glucose) after you have not eaten for a while (fasting). You may have this done every 1-3 years.  Bone density scan. This is done to screen for osteoporosis. You may have this done starting at age 48.  Mammogram. This may be done every 1-2 years. Talk to your health care provider about how often you should have regular mammograms. Talk with your health care provider about your test results, treatment options, and if necessary, the need for more tests. Vaccines  Your health care provider may recommend certain vaccines, such as:  Influenza vaccine. This is recommended every year.  Tetanus,  diphtheria, and acellular pertussis (Tdap, Td) vaccine. You may need a Td booster every 10 years.  Zoster vaccine. You may need this after age 18.  Pneumococcal 13-valent conjugate (PCV13) vaccine. One dose is recommended after age 56.  Pneumococcal  polysaccharide (PPSV23) vaccine. One dose is recommended after age 36. Talk to your health care provider about which screenings and vaccines you need and how often you need them. This information is not intended to replace advice given to you by your health care provider. Make sure you discuss any questions you have with your health care provider. Document Released: 08/21/2015 Document Revised: 04/13/2016 Document Reviewed: 05/26/2015 Elsevier Interactive Patient Education  2017 [employer] Prevention in the Home Falls can cause injuries. They can happen to people of all ages. There are many things you can do to make your home safe and to help prevent falls. What can I do on the outside of my home?  Regularly fix the edges of walkways and driveways and fix any cracks.  Remove anything that might make you trip as you walk through a door, such as a raised step or threshold.  Trim any bushes or trees on the path to your home.  Use bright outdoor lighting.  Clear any walking paths of anything that might make someone trip, such as rocks or tools.  Regularly check to see if handrails are loose or broken. Make sure that both sides of any steps have handrails.  Any raised decks and porches should have guardrails on the edges.  Have any leaves, snow, or ice cleared regularly.  Use sand or salt on walking paths during winter.  Clean up any spills in your garage right away. This includes oil or grease spills. What can I do in the bathroom?  Use night lights.  Install grab bars by the toilet and in the tub and shower. Do not use towel bars as grab bars.  Use non-skid mats or decals in the tub or shower.  If you need to sit down in the shower, use a plastic, non-slip stool.  Keep the floor dry. Clean up any water that spills on the floor as soon as it happens.  Remove soap buildup in the tub or shower regularly.  Attach bath mats securely with double-sided non-slip rug  tape.  Do not have throw rugs and other things on the floor that can make you trip. What can I do in the bedroom?  Use night lights.  Make sure that you have a light by your bed that is easy to reach.  Do not use any sheets or blankets that are too big for your bed. They should not hang down onto the floor.  Have a firm chair that has side arms. You can use this for support while you get dressed.  Do not have throw rugs and other things on the floor that can make you trip. What can I do in the kitchen?  Clean up any spills right away.  Avoid walking on wet floors.  Keep items that you use a lot in easy-to-reach places.  If you need to reach something above you, use a strong step stool that has a grab bar.  Keep electrical cords out of the way.  Do not use floor polish or wax that makes floors slippery. If you must use wax, use non-skid floor wax.  Do not have throw rugs and other things on the floor that can make you trip. What can I do with  my stairs?  Do not leave any items on the stairs.  Make sure that there are handrails on both sides of the stairs and use them. Fix handrails that are broken or loose. Make sure that handrails are as long as the stairways.  Check any carpeting to make sure that it is firmly attached to the stairs. Fix any carpet that is loose or worn.  Avoid having throw rugs at the top or bottom of the stairs. If you do have throw rugs, attach them to the floor with carpet tape.  Make sure that you have a light switch at the top of the stairs and the bottom of the stairs. If you do not have them, ask someone to add them for you. What else can I do to help prevent falls?  Wear shoes that:  Do not have high heels.  Have rubber bottoms.  Are comfortable and fit you well.  Are closed at the toe. Do not wear sandals.  If you use a stepladder:  Make sure that it is fully opened. Do not climb a closed stepladder.  Make sure that both sides of the  stepladder are locked into place.  Ask someone to hold it for you, if possible.  Clearly mark and make sure that you can see:  Any grab bars or handrails.  First and last steps.  Where the edge of each step is.  Use tools that help you move around (mobility aids) if they are needed. These include:  Canes.  Walkers.  Scooters.  Crutches.  Turn on the lights when you go into a dark area. Replace any light bulbs as soon as they burn out.  Set up your furniture so you have a clear path. Avoid moving your furniture around.  If any of your floors are uneven, fix them.  If there are any pets around you, be aware of where they are.  Review your medicines with your doctor. Some medicines can make you feel dizzy. This can increase your chance of falling. Ask your doctor what other things that you can do to help prevent falls. This information is not intended to replace advice given to you by your health care provider. Make sure you discuss any questions you have with your health care provider. Document Released: 05/21/2009 Document Revised: 12/31/2015 Document Reviewed: 08/29/2014 Elsevier Interactive Patient Education  2017 [employer].

## 2018-05-01 ENCOUNTER — Other Ambulatory Visit: Payer: Self-pay | Admitting: General Surgery

## 2018-05-01 ENCOUNTER — Telehealth: Payer: Self-pay | Admitting: *Deleted

## 2018-05-01 DIAGNOSIS — C50211 Malignant neoplasm of upper-inner quadrant of right female breast: Secondary | ICD-10-CM

## 2018-05-01 DIAGNOSIS — Z170 Estrogen receptor positive status [ER+]: Principal | ICD-10-CM

## 2018-05-01 LAB — LIPID PANEL
Cholesterol: 170 mg/dL (ref <200)
HDL: 63 mg/dL (ref >50)
LDL Cholesterol (Calc): 85 mg/dL (calc)
Non-HDL Cholesterol (Calc): 107 mg/dL (calc) (ref <130)
Total CHOL/HDL Ratio: 2.7 (calc) (ref <5.0)
Triglycerides: 121 mg/dL (ref <150)

## 2018-05-01 LAB — HEMOGLOBIN A1C
Hgb A1c MFr Bld: 5.6 % of total Hgb (ref <5.7)
Mean Plasma Glucose: 114 (calc)
eAG (mmol/L): 6.3 (calc)

## 2018-05-01 NOTE — Telephone Encounter (Signed)
Spoke to pt concerning [facility] from 9.18.19. Denies questions or concerns regarding dx or treatment care plan.  Scheduled and confirmed post-op with Dr. Lindi Adie on 9/21 at 9:15. Denies further questions or needs. Encourage pt to call if any should arise. Received verbal understanding.

## 2018-05-03 ENCOUNTER — Encounter: Payer: Self-pay | Admitting: *Deleted

## 2018-05-07 ENCOUNTER — Encounter (INDEPENDENT_AMBULATORY_CARE_PROVIDER_SITE_OTHER): Payer: Self-pay | Admitting: Physical Medicine and Rehabilitation

## 2018-05-07 ENCOUNTER — Ambulatory Visit (INDEPENDENT_AMBULATORY_CARE_PROVIDER_SITE_OTHER): Payer: Medicare Other | Admitting: Physical Medicine and Rehabilitation

## 2018-05-07 ENCOUNTER — Ambulatory Visit (INDEPENDENT_AMBULATORY_CARE_PROVIDER_SITE_OTHER): Payer: Self-pay

## 2018-05-07 VITALS — BP 127/64 | HR 78 | Temp 98.4°F

## 2018-05-07 DIAGNOSIS — M5416 Radiculopathy, lumbar region: Secondary | ICD-10-CM

## 2018-05-07 MED ORDER — BETAMETHASONE SOD PHOS & ACET 6 (3-3) MG/ML IJ SUSP
12.0000 mg | Freq: Once | INTRAMUSCULAR | Status: AC
  Administered 2018-05-07: 12 mg

## 2018-05-07 NOTE — Progress Notes (Signed)
Rebecca Guzman - 84 y.o. female MRN [id number]  Date of birth: [birth year]  Office Visit Note: Visit Date: 05/07/2018 PCP: [name], DO Referred by: [name], DO  Subjective: Chief Complaint  Patient presents with  . Lower Back - Pain   HPI: Mrs. Treat is a very pleasant 84 year old female normally followed by [name] who comes in today with chronic worsening lower back pain more right than left.  She reports 2 to 3 months of worsening progressive pain without any specific injury or fall.  No real radicular complaints down the legs although it does go on the right hip.  She reports household activities really make it worse.  Is worse in the morning but also worse with standing and vacuuming and doing dishes.  She does use medication and heating pad which does help some.  She has not noted any focal weakness or bowel bladder difficulties.  She has not had any fever chills or night sweats.  She has had a history of breast cancer next has had a recurrence of this and is undergoing biopsy in a couple weeks.  Otherwise she has been doing okay.  Prior injection was a right L3 transforaminal injection in 2018.  This did give her some relief.  She was having hip and thigh pain at that point.  We repeated the injection in February of this year.  She reports this injection was very painful but did help quite a bit.  Reviewing the CT images they were completed one from an infra neural approach through Kambin's triangle and the other one was more of a sub-pedicular approach.  She is not having left-sided hip pain.   Review of Systems  Constitutional: Negative for chills, fever, malaise/fatigue and weight loss.  HENT: Negative for hearing loss and sinus pain.   Eyes: Negative for blurred vision, double vision and photophobia.  Respiratory: Negative for cough and shortness of breath.   Cardiovascular: Negative for chest pain, palpitations and leg swelling.  Gastrointestinal: Negative  for abdominal pain, nausea and vomiting.  Genitourinary: Negative for flank pain.  Musculoskeletal: Positive for back pain and joint pain. Negative for myalgias.  Skin: Negative for itching and rash.  Neurological: Negative for tremors, focal weakness and weakness.  Endo/Heme/Allergies: Negative.   Psychiatric/Behavioral: Negative for depression.  All other systems reviewed and are negative.  Otherwise per HPI.  Assessment & Plan: Visit Diagnoses:  1. Lumbar radiculopathy     Plan: Findings:  Chronic worsening severe most days 5 out of 10 pain but it does affect her daily activities and this is really when it is worse with household activities.  She gets a lot of pain across the back which is more of a dull aching pain she says she has a high pain tolerance.  Biggest complaint is on the right side referring into the posterior lateral hip area.  I think she is getting pain likely from the right L3-4 region where there is foraminal stenosis as well as potentially the moderate stenosis at L4-5.  I think the low level pain that she gets across the back is facet mediated pain worse with extension and facet joint loading today on exam.  I think the best approach is to repeat the right L3 injection as she is done well with that.  We would do this from an infra neural approach as it seemed to be just as beneficial with less pain.  We will see how she does with the  injection.  Depending on relief would look at medial branch blocks at L4-5 and L5-S1.  Goal would be radiofrequency ablation potentially if that seem to be an issue and was helpful.  She also did notify her doctors about the steroid injection she was given a half today in the upcoming biopsy and they did say it was okay for her to have the injection today we will go ahead and do that today.    Meds & Orders:  Meds ordered this encounter  Medications  . betamethasone acetate-betamethasone sodium phosphate (CELESTONE) injection 12 mg    Orders  Placed This Encounter  Procedures  . XR C-ARM NO REPORT  . Epidural Steroid injection    Follow-up: Return if symptoms worsen or fail to improve.   Procedures: No procedures performed  Lumbosacral Transforaminal Epidural Steroid Injection - Infraneural Approach with Fluoroscopic Guidance  Patient: Rebecca Guzman      Date of Birth: November 15, 1933 MRN: [id number] PCP: [name], DO      Visit Date: 05/07/2018   Universal Protocol:     Consent Given By: the patient  Position: PRONE   Additional Comments: Vital signs were monitored before and after the procedure. Patient was prepped and draped in the usual sterile fashion. The correct patient, procedure, and site was verified.   Injection Procedure Details:  Procedure Site One Meds Administered:  Meds ordered this encounter  Medications  . betamethasone acetate-betamethasone sodium phosphate (CELESTONE) injection 12 mg      Laterality: Right  Location/Site:  L3-L4  Needle size: 22 G  Needle type: Spinal  Needle Placement: Transforaminal  Findings:   -Comments: Excellent flow of contrast along the nerve and into the epidural space.  Procedure Details: After squaring off the end-plates of the desired vertebral level to get a true AP view, the C-arm was obliqued to the painful side so that the superior articulating process is positioned about 1/3 the length of the inferior endplate.  The needle was aimed toward the junction of the superior articular process and the transverse process of the inferior vertebrae. The needle's initial entry is in the lower third of the foramen through Kambin's triangle. The soft tissues overlying this target were infiltrated with 2-3 ml. of 1% Lidocaine without Epinephrine.  The spinal needle was then inserted and advanced toward the target using a "trajectory" view along the fluoroscope beam.  Under AP and lateral visualization, the needle was advanced so it did not puncture dura and  did not traverse medially beyond the 6 o'clock position of the pedicle. Bi-planar projections were used to confirm position. Aspiration was confirmed to be negative for CSF and/or blood. A 1-2 ml. volume of Isovue-250 was injected and flow of contrast was noted at each level. Radiographs were obtained for documentation purposes.   After attaining the desired flow of contrast documented above, a 0.5 to 1.0 ml test dose of 0.25% Marcaine was injected into each respective transforaminal space.  The patient was observed for 90 seconds post injection.  After no sensory deficits were reported, and normal lower extremity motor function was noted,   the above injectate was administered so that equal amounts of the injectate were placed at each foramen (level) into the transforaminal epidural space.   Additional Comments:  The patient tolerated the procedure well Dressing: Band-Aid    Post-procedure details: Patient was observed during the procedure. Post-procedure instructions were reviewed.  Patient left the clinic in stable condition.   Clinical History: MRI LUMBAR SPINE  WITHOUT CONTRAST  TECHNIQUE: Multiplanar, multisequence MR imaging of the lumbar spine was performed. No intravenous contrast was administered.  COMPARISON:  Lumbar spine radiographs 04/06/2015  FINDINGS: Moderate lumbar levoscoliosis is again seen with apex at L2-3. There is trace retrolisthesis of L1 on L2 and L2 on L3 and trace anterolisthesis of L4 on L5. There is at most minimal right-sided vertebral body height loss at L3 which is likely related to the scoliosis. No frank compression fracture or marrow edema suggestive of recent osseous injury is identified.  There is minimal right-sided degenerative endplate edema at C1-4 and L3-4 associated with asymmetric right-sided disc space height loss at these levels. There is also mild-to-moderate disc space narrowing at L1-2 and L5-S1. Disc desiccation is present  throughout the lumbar spine.  The conus medullaris is normal in signal and terminates at L1. There is slight fullness of the renal collecting systems and ureters bilaterally, incompletely evaluated but may be physiologic given partially visualized mild bladder distention.  L1-2: Mild disc bulging and endplate spurring result in minimal right lateral recess narrowing without spinal canal or neural foraminal stenosis.  L2-3: Mild disc bulging, endplate spurring, and asymmetric right facet arthrosis result in minimal right lateral recess and minimal right neural foraminal stenosis without spinal stenosis.  L3-4: Disc bulging asymmetric to the right and moderate right and mild left facet arthrosis result in moderate right and mild left lateral recess stenosis and moderate to severe right neural foraminal stenosis without spinal stenosis. There is the potential for the right-sided L3 and/or L4 nerve root impingement.  L4-5: Listhesis with disc uncovering and advanced left greater than right facet arthrosis result in moderate spinal stenosis, mild right and moderate left lateral recess stenosis, and at most minimal bilateral neural foraminal stenosis.  L5-S1: Disc bulging, small left paracentral disc extrusion with mild inferior migration in the lateral recess, and mild right and severe left facet arthrosis result in mild right and moderate to severe left lateral recess stenosis and mild left neural foraminal stenosis without spinal stenosis.  IMPRESSION: 1. Advanced L4-5 facet arthrosis with grade 1 anterolisthesis, moderate spinal stenosis, and left greater than right lateral recess stenosis. 2. Moderate right lateral recess stenosis and moderate to severe right neural foraminal stenosis at L3-4. 3. Moderate lumbar levoscoliosis.   Electronically Signed   By: Logan Bores M.D.   On: 10/15/2015 08:42   She reports that she quit smoking about 34 years ago. Her smoking  use included cigarettes. She quit after 13.00 years of use. She has never used smokeless tobacco.  Recent Labs    10/19/17 0809 04/30/18 0929  HGBA1C 5.4 5.6    Objective:  VS:  HT:    WT:   BMI:     BP:127/64  HR:78bpm  TEMP:98.4 F (36.9 C)(Oral)  RESP:  Physical Exam  Constitutional: She is oriented to person, place, and time. She appears well-developed and well-nourished.  Eyes: Pupils are equal, round, and reactive to light. Conjunctivae and EOM are normal.  Cardiovascular: Normal rate and intact distal pulses.  Pulmonary/Chest: Effort normal.  Musculoskeletal:  Patient ambulates without aid.  She is somewhat slow to rise from a seated position to full extension.  She does have pain with facet joint loading.  Some pain over the greater trochanters right more than left.  No pain with hip rotation good distal strength.  Neurological: She is alert and oriented to person, place, and time. She exhibits normal muscle tone. Coordination normal.  Skin: Skin is warm  and dry. No rash noted. No erythema.  Psychiatric: She has a normal mood and affect. Her behavior is normal.  Nursing note and vitals reviewed.   Ortho Exam Imaging: Xr C-arm No Report  Result Date: 05/07/2018 Please see Notes tab for imaging impression.   Past Medical/Family/Surgical/Social History: Medications & Allergies reviewed per EMR, new medications updated. Patient Active Problem List   Diagnosis Date Noted  . Malignant neoplasm of upper-inner quadrant of right breast in female, estrogen receptor positive ([place]) 04/19/2018  . Wears hearing aid   . Wears glasses   . Vaginal vault prolapse   . Sensation of pressure in bladder area   . PONV (postoperative nausea and vomiting)   . History of breast cancer   . History of adenomatous polyp of colon   . Hiatal hernia   . BPPV (benign paroxysmal positional vertigo)   . Palpitations 10/31/2016  . Prolapse of female pelvic organs 02/29/2016  . Radicular pain  of right lower back 04/06/2015  . Compression fracture of L3 lumbar vertebra 04/06/2015  . Senile osteoporosis 04/06/2015  . DDD (degenerative disc disease), lumbar 04/06/2015  . Facet arthropathy, lumbar 04/06/2015  . Other malaise and fatigue 01/17/2014  . Hx of adenomatous colonic polyps 07/11/2013  . Osteoarthritis of right knee   . Hyperlipidemia LDL goal <100   . GERD (gastroesophageal reflux disease)   . Bladder prolapse, female, acquired   . Allergic sinusitis   . Esophageal reflux 10/12/2011  . Personal history of chemotherapy 08/08/1990   Past Medical History:  Diagnosis Date  . Asthma   . BPPV (benign paroxysmal positional vertigo)   . Compression fracture of L3 lumbar vertebra 04/06/2015  . GERD (gastroesophageal reflux disease)   . Hiatal hernia   . History of adenomatous polyp of colon    tubular adenoma 2005 and  2014 tubular adenoma and  hyperplastic polyp  . History of breast cancer per pt no recurrence   dx 1992 --  s/p  left breast mastectomy and chemotherapy  . Hyperlipidemia LDL goal <100   . Osteoarthritis of right knee   . Personal history of chemotherapy 1992  . PONV (postoperative nausea and vomiting)    and "gets weak and light-leaded"  . Sensation of pressure in bladder area   . Vaginal vault prolapse    anterior  . Wears glasses   . Wears hearing aid    left only   Family History  Problem Relation Age of Onset  . Heart attack Mother   . Colon cancer Mother   . Hypertension Mother   . Heart disease Father   . Breast cancer Sister        half sister   Past Surgical History:  Procedure Laterality Date  . CATARACT EXTRACTION W/ INTRAOCULAR LENS  IMPLANT, BILATERAL  2014  . CHOLECYSTECTOMY  1985  . COLONOSCOPY  last one 06-04-2013  . CYSTOCELE REPAIR N/A 02/29/2016   Procedure: ANTERIOR VAULT PROLAPSE REPAIR COLOPLAST [place] FIXATION AUGMENTED AXIS DERMIS REPAIR  ;  Surgeon: Carolan Clines, MD;  Location: [facility];   Service: Urology;  Laterality: N/A;  . ESOPHAGOGASTRODUODENOSCOPY  08-31-2009  . LAPAROSCOPIC ASSISTED VAGINAL HYSTERECTOMY  01-10-2000   w/ Bilateral Salpingoophorectomy /  Anterior & Posterior repair/  Pubovaginal Sling  . MASTECTOMY Left 1992   Social History   Occupational History  . Occupation: Retired Product manager: RETIRED  Tobacco Use  . Smoking status: Former Smoker    Years: 13.00  Types: Cigarettes    Last attempt to quit: 10/12/1983    Years since quitting: 34.5  . Smokeless tobacco: Never Used  Substance and Sexual Activity  . Alcohol use: No  . Drug use: No  . Sexual activity: Not on file

## 2018-05-07 NOTE — Patient Instructions (Signed)

## 2018-05-07 NOTE — Progress Notes (Signed)
 .  Numeric Pain Rating Scale and Functional Assessment Average Pain 5   In the last MONTH (on 0-10 scale) has pain interfered with the following?  1. General activity like being  able to carry out your everyday physical activities such as walking, climbing stairs, carrying groceries, or moving a chair?  Rating(4)   +Driver, -BT, -Dye Allergies.  

## 2018-05-07 NOTE — Procedures (Signed)
Lumbosacral Transforaminal Epidural Steroid Injection - Infraneural Approach with Fluoroscopic Guidance  Patient: Rebecca Guzman      Date of Birth: April 02, 1934 MRN: [id number] PCP: [name], DO      Visit Date: 05/07/2018   Universal Protocol:     Consent Given By: the patient  Position: PRONE   Additional Comments: Vital signs were monitored before and after the procedure. Patient was prepped and draped in the usual sterile fashion. The correct patient, procedure, and site was verified.   Injection Procedure Details:  Procedure Site One Meds Administered:  Meds ordered this encounter  Medications  . betamethasone acetate-betamethasone sodium phosphate (CELESTONE) injection 12 mg      Laterality: Right  Location/Site:  L3-L4  Needle size: 22 G  Needle type: Spinal  Needle Placement: Transforaminal  Findings:   -Comments: Excellent flow of contrast along the nerve and into the epidural space.  Procedure Details: After squaring off the end-plates of the desired vertebral level to get a true AP view, the C-arm was obliqued to the painful side so that the superior articulating process is positioned about 1/3 the length of the inferior endplate.  The needle was aimed toward the junction of the superior articular process and the transverse process of the inferior vertebrae. The needle's initial entry is in the lower third of the foramen through Kambin's triangle. The soft tissues overlying this target were infiltrated with 2-3 ml. of 1% Lidocaine without Epinephrine.  The spinal needle was then inserted and advanced toward the target using a "trajectory" view along the fluoroscope beam.  Under AP and lateral visualization, the needle was advanced so it did not puncture dura and did not traverse medially beyond the 6 o'clock position of the pedicle. Bi-planar projections were used to confirm position. Aspiration was confirmed to be negative for CSF and/or blood. A 1-2 ml.  volume of Isovue-250 was injected and flow of contrast was noted at each level. Radiographs were obtained for documentation purposes.   After attaining the desired flow of contrast documented above, a 0.5 to 1.0 ml test dose of 0.25% Marcaine was injected into each respective transforaminal space.  The patient was observed for 90 seconds post injection.  After no sensory deficits were reported, and normal lower extremity motor function was noted,   the above injectate was administered so that equal amounts of the injectate were placed at each foramen (level) into the transforaminal epidural space.   Additional Comments:  The patient tolerated the procedure well Dressing: Band-Aid    Post-procedure details: Patient was observed during the procedure. Post-procedure instructions were reviewed.  Patient left the clinic in stable condition.

## 2018-05-09 NOTE — Pre-Procedure Instructions (Signed)
Rebecca Guzman  05/09/2018      [id number] [place], [place] - [address] AT [facility] [address] [place] [address] Phone: [phone] Fax: [phone]    Your procedure is scheduled on Thursday October 10.  Report to [facility] Admitting at 9:30 A.M.  Call this number if you have problems the morning of surgery:  [phone]   Remember:  Do not eat or drink after midnight.  You may drink clear liquids until 8:30 AM .  Clear liquids allowed are:  Water, Juice (non-citric and without pulp), Black Coffee only and Gatorade    Take these medicines the morning of surgery with A SIP OF WATER:   Metoprolol (Lopressor) Omeprazole (Prilosec) Acetaminophen (Tylenol)  Loratadine (Claritin)  7 days prior to surgery STOP taking any Aspirin(unless otherwise instructed by your surgeon), Aleve, Naproxen, Ibuprofen, Motrin, Advil, Goody's, BC's, all herbal medications, fish oil, and all vitamins      Do not wear jewelry, make-up or nail polish.  Do not wear lotions, powders, or perfumes, or deodorant.  Do not shave 48 hours prior to surgery.  Men may shave face and neck.  Do not bring valuables to the hospital.  [facility] is not responsible for any belongings or valuables.  Contacts, dentures or bridgework may not be worn into surgery.  Leave your suitcase in the car.  After surgery it may be brought to your room.  For patients admitted to the hospital, discharge time will be determined by your treatment team.  Patients discharged the day of surgery will not be allowed to drive home.    Special instructions:    [facility]- Preparing For Surgery  Before surgery, you can play an important role. Because skin is not sterile, your skin needs to be as free of germs as possible. You can reduce the number of germs on your skin by washing with CHG (chlorahexidine gluconate) Soap before surgery.  CHG is an antiseptic cleaner  which kills germs and bonds with the skin to continue killing germs even after washing.    Oral Hygiene is also important to reduce your risk of infection.  Remember - BRUSH YOUR TEETH THE MORNING OF SURGERY WITH YOUR REGULAR TOOTHPASTE  Please do not use if you have an allergy to CHG or antibacterial soaps. If your skin becomes reddened/irritated stop using the CHG.  Do not shave (including legs and underarms) for at least 48 hours prior to first CHG shower. It is OK to shave your face.  Please follow these instructions carefully.   1. Shower the NIGHT BEFORE SURGERY and the MORNING OF SURGERY with CHG.   2. If you chose to wash your hair, wash your hair first as usual with your normal shampoo.  3. After you shampoo, rinse your hair and body thoroughly to remove the shampoo.  4. Use CHG as you would any other liquid soap. You can apply CHG directly to the skin and wash gently with a scrungie or a clean washcloth.   5. Apply the CHG Soap to your body ONLY FROM THE NECK DOWN.  Do not use on open wounds or open sores. Avoid contact with your eyes, ears, mouth and genitals (private parts). Wash Face and genitals (private parts)  with your normal soap.  6. Wash thoroughly, paying special attention to the area where your surgery will be performed.  7. Thoroughly rinse your body with  warm water from the neck down.  8. DO NOT shower/wash with your normal soap after using and rinsing off the CHG Soap.  9. Pat yourself dry with a CLEAN TOWEL.  10. Wear CLEAN PAJAMAS to bed the night before surgery, wear comfortable clothes the morning of surgery  11. Place CLEAN SHEETS on your bed the night of your first shower and DO NOT SLEEP WITH PETS.    Day of Surgery:  Do not apply any deodorants/lotions.  Please wear clean clothes to the hospital/surgery center.   Remember to brush your teeth WITH YOUR REGULAR TOOTHPASTE.    Please read over the following fact sheets that you were  given. Coughing and Deep Breathing and Surgical Site Infection Prevention

## 2018-05-10 ENCOUNTER — Encounter (HOSPITAL_COMMUNITY)
Admission: RE | Admit: 2018-05-10 | Discharge: 2018-05-10 | Disposition: A | Discharge status: Home / Self Care | Payer: Medicare Other | Source: Ambulatory Visit | Attending: General Surgery | Admitting: General Surgery

## 2018-05-10 ENCOUNTER — Other Ambulatory Visit: Payer: Self-pay | Admitting: General Surgery

## 2018-05-10 ENCOUNTER — Encounter (HOSPITAL_COMMUNITY): Payer: Self-pay

## 2018-05-10 DIAGNOSIS — Z170 Estrogen receptor positive status [ER+]: Secondary | ICD-10-CM | POA: Diagnosis not present

## 2018-05-10 DIAGNOSIS — Z01818 Encounter for other preprocedural examination: Secondary | ICD-10-CM | POA: Diagnosis present

## 2018-05-10 DIAGNOSIS — C50211 Malignant neoplasm of upper-inner quadrant of right female breast: Secondary | ICD-10-CM | POA: Diagnosis not present

## 2018-05-10 LAB — BASIC METABOLIC PANEL
Anion gap: 10 (ref 5–15)
BUN: 15 mg/dL (ref 8–23)
CHLORIDE: 102 mmol/L (ref 98–111)
CO2: 27 mmol/L (ref 22–32)
Calcium: 9.7 mg/dL (ref 8.9–10.3)
Creatinine, Ser: 0.9 mg/dL (ref 0.44–1.00)
GFR, EST NON AFRICAN AMERICAN: 58 mL/min — AB (ref >60)
Glucose, Bld: 105 mg/dL — ABNORMAL HIGH (ref 70–99)
POTASSIUM: 4.2 mmol/L (ref 3.5–5.1)
SODIUM: 139 mmol/L (ref 135–145)

## 2018-05-10 LAB — CBC
HCT: 41.2 % (ref 36.0–46.0)
Hemoglobin: 13.3 g/dL (ref 12.0–15.0)
MCH: 29.8 pg (ref 26.0–34.0)
MCHC: 32.3 g/dL (ref 30.0–36.0)
MCV: 92.2 fL (ref 78.0–100.0)
PLATELETS: 239 10*3/uL (ref 150–400)
RBC: 4.47 MIL/uL (ref 3.87–5.11)
RDW: 13.2 % (ref 11.5–15.5)
WBC: 9.7 10*3/uL (ref 4.0–10.5)

## 2018-05-10 MED ORDER — CHLORHEXIDINE GLUCONATE CLOTH 2 % EX PADS: 6.0000 | MEDICATED_PAD | Freq: Once | CUTANEOUS | Status: DC

## 2018-05-10 NOTE — Progress Notes (Signed)
Anesthesia PAT Evaluation (patient request):   Case:  [id number] Date/Time:  05/17/18 1115   Procedure:  BREAST LUMPECTOMY WITH RADIOACTIVE SEED LOCALIZATION (Right Breast)   Anesthesia type:  General   Pre-op diagnosis:  RIGHT BREAST CANCER   Location:  [facility] ROOM 02 / [facility]   Surgeon:  [name], MD      DISCUSSION: Patient is a 84 year old female scheduled for the above procedure. She reports seed is being implanted on 05/16/18.  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   She asked to speak with anesthesia APP due to concerns of how she has done with anesthesia in the past. She developed hypotension, N/V, and "woke up with a mask on" when she received Morphine after childbirth many years ago. She wants to avoid narcotics. She was not sure if she has tolerated fentanyl in the past. She said she did well with her anesthesia on 02/29/16. See Anesthesia records for more detail, but anesthetics/medications given 02/29/16:  Sevoflurane fentaNYL (SUBLIMAZE) injection 168mcg/2ml 50 mcg  propofol (DIPRIVAN) BOLUS only 150 mg  dexamethasone (DECADRON) injection 4 mg/mL 10 mg  ketorolac (TORADOL) injection 30 mg 15 mg  ceFAZolin (ANCEF) IVPB 2g/100 mL premix 2 g  lidocaine 2% (20 mg/mL) 5 mL SYRINGE 50 mg  fluorescein injection 50 mg  ondansetron (ZOFRAN) injection 4 mg 4 mg  lactated ringers infusion 1,100 mL    She has had recent cardiology evaluation for palpitations with improvement once started on b-blocker therapy. Denied CV symptoms at PAT. Anesthesia concerns as outlined above. She did well with 2017 surgery. Anesthesiologist to evaluate on the day of surgery and discuss definitive anesthesia plan.    VS: BP 115/62   Pulse 76   Temp 36.9 C (Oral)   Resp 18   Ht  5\' 5"  (1.651 m)   Wt 78.5 kg   LMP 11/21/1985   SpO2 97%   BMI 28.79 kg/m  Patient is a pleasant Caucasian female in NAD. No conversational dyspnea. Mouth opening okay. Heart RRR, no murmur noted. Lungs clear. No LE edema. No carotid bruits noted. She denied chest pain, SOB, syncope. Occasionally mild ankle edema, but no worsening. She describes palpitations as "slight" now that she is on b-blocker therapy.    PROVIDERS: [name], DO is PCP Ena Dawley, MD is cardiologist. Last visit 03/28/18 for palpitation follow-up. She had very short runs of AT on Holter monitor with improvement with b-blocker therapy.     LABS: Labs reviewed: Acceptable for surgery. Last A1c 5.6 on 04/30/18. (all labs ordered are listed, but only abnormal results are displayed)  Labs Reviewed  BASIC METABOLIC PANEL - Abnormal; Notable for the following components:      Result Value   Glucose, Bld 105 (*)    GFR calc non Af Amer 58 (*)    All other components within normal limits  CBC    EKG: 03/28/18: NSR   CV: Echo 11/15/16: Study Conclusions - Left ventricle: The cavity size was normal. Wall thickness was   increased in a pattern of mild LVH. Systolic function was normal.   The estimated ejection fraction was in the range of 60% to 65%.   Wall motion was normal; there were no regional wall motion   abnormalities. Doppler parameters are consistent with abnormal  left ventricular relaxation (grade 1 diastolic dysfunction). The   E/e&' ratio is >15, suggesting elevated LV filling pressure. - Mitral valve: Mildly thickened and sclerotic anterior leaflet.   There was trivial regurgitation. - Left atrium: The atrium was normal in size. - Tricuspid valve: There was mild regurgitation. - Pulmonary arteries: PA peak pressure: 37 mm Hg (S). - Inferior vena cava: The vessel was normal in size. The   respirophasic diameter changes were in the normal range (>= 50%),   consistent with normal central  venous pressure. Impressions: - LVEF 60-65%, mild LVH, normal wall motion, grade 1 DD with   elevated LV filling pressure, trivial MR, normal LA size, mild   TR, RVSP 37 mmHg, normal IVC.  Holter monitor 11/15/16:  frequent PACs (700 in 34 hrs).  Few very short runs of atrial tachycardia with the longest lasting 4 beats Recommendation: Start a very low dose metoprolol 12.5 mg po BID.  Last stress test > 20 years ago.    Past Medical History:  Diagnosis Date  . Asthma   . BPPV (benign paroxysmal positional vertigo)   . Compression fracture of L3 lumbar vertebra 04/06/2015  . GERD (gastroesophageal reflux disease)   . Hiatal hernia    pt states she doesnt have this  . History of adenomatous polyp of colon    tubular adenoma 2005 and  2014 tubular adenoma and  hyperplastic polyp  . History of breast cancer per pt no recurrence   dx 1992 --  s/p  left breast mastectomy and chemotherapy  . Hyperlipidemia LDL goal <100   . Osteoarthritis of right knee   . Personal history of chemotherapy 1992  . PONV (postoperative nausea and vomiting)    and "gets weak and light-leaded"  . Sensation of pressure in bladder area   . Vaginal vault prolapse    anterior  . Wears glasses   . Wears hearing aid    left only    Past Surgical History:  Procedure Laterality Date  . CATARACT EXTRACTION W/ INTRAOCULAR LENS  IMPLANT, BILATERAL  2014  . CHOLECYSTECTOMY  1985  . COLONOSCOPY  last one 06-04-2013  . CYSTOCELE REPAIR N/A 02/29/2016   Procedure: ANTERIOR VAULT PROLAPSE REPAIR COLOPLAST [place] FIXATION AUGMENTED AXIS DERMIS REPAIR  ;  Surgeon: Carolan Clines, MD;  Location: [facility];  Service: Urology;  Laterality: N/A;  . ESOPHAGOGASTRODUODENOSCOPY  08-31-2009  . LAPAROSCOPIC ASSISTED VAGINAL HYSTERECTOMY  01-10-2000   w/ Bilateral Salpingoophorectomy /  Anterior & Posterior repair/  Pubovaginal Sling  . MASTECTOMY Left 1992    MEDICATIONS: . acetaminophen  (TYLENOL) 500 MG tablet  . Ca Carbonate-Mag Hydroxide (ROLAIDS) 550-110 MG CHEW  . Calcium Citrate-Vitamin D (CITRACAL + D PO)  . loratadine (CLARITIN) 10 MG tablet  . metoprolol tartrate (LOPRESSOR) 25 MG tablet  . Multiple Vitamin (MULTIVITAMIN) tablet  . Omega-3 Fatty Acids (FISH OIL) 1000 MG CAPS  . omeprazole (PRILOSEC) 40 MG capsule  . pravastatin (PRAVACHOL) 40 MG tablet  . Probiotic Product (PROBIOTIC FORMULA PO)   No current facility-administered medications for this encounter.     George Hugh [facility]/Anesthesiology Phone [phone] 05/10/2018 5:16 PM

## 2018-05-10 NOTE — Progress Notes (Signed)
Dr Chuck Hint office called re. Consent order for specific body part.

## 2018-05-10 NOTE — Anesthesia Preprocedure Evaluation (Addendum)
Anesthesia Evaluation  Patient identified by MRN, date of birth, ID band Patient awake    Reviewed: Allergy & Precautions, NPO status , Patient's Chart, lab work & pertinent test results, reviewed documented beta blocker date and time   History of Anesthesia Complications (+) PONV and history of anesthetic complications  Airway Mallampati: I       Dental no notable dental hx. (+) Teeth Intact   Pulmonary former smoker,    Pulmonary exam normal breath sounds clear to auscultation       Cardiovascular Normal cardiovascular exam Rhythm:Regular Rate:Normal     Neuro/Psych negative neurological ROS  negative psych ROS   GI/Hepatic Neg liver ROS, GERD  Medicated,  Endo/Other  negative endocrine ROS  Renal/GU negative Renal ROS  negative genitourinary   Musculoskeletal  (+) Arthritis , Osteoarthritis,    Abdominal Normal abdominal exam  (+)   Peds  Hematology negative hematology ROS (+)   Anesthesia Other Findings   Reproductive/Obstetrics                                                             Anesthesia Evaluation  Patient identified by MRN, date of birth, ID band Patient awake    Reviewed: Allergy & Precautions, NPO status , Patient's Chart, lab work & pertinent test results  History of Anesthesia Complications (+) PONV and history of anesthetic complications  Airway Mallampati: II  TM Distance: >3 FB Neck ROM: Full    Dental no notable dental hx. (+) Dental Advisory Given   Pulmonary asthma , former smoker,    Pulmonary exam normal breath sounds clear to auscultation       Cardiovascular negative cardio ROS Normal cardiovascular exam Rhythm:Regular Rate:Normal     Neuro/Psych negative neurological ROS  negative psych ROS   GI/Hepatic Neg liver ROS, GERD  Controlled and Medicated,  Endo/Other  negative endocrine ROS  Renal/GU negative Renal ROS   negative genitourinary   Musculoskeletal  (+) Arthritis ,   Abdominal   Peds negative pediatric ROS (+)  Hematology negative hematology ROS (+)   Anesthesia Other Findings   Reproductive/Obstetrics negative OB ROS                            Anesthesia Physical Anesthesia Plan  ASA: II  Anesthesia Plan: General   Post-op Pain Management:    Induction: Intravenous  Airway Management Planned: LMA  Additional Equipment:   Intra-op Plan:   Post-operative Plan: Extubation in OR  Informed Consent: I have reviewed the patients History and Physical, chart, labs and discussed the procedure including the risks, benefits and alternatives for the proposed anesthesia with the patient or authorized representative who has indicated his/her understanding and acceptance.   Dental advisory given  Plan Discussed with: CRNA  Anesthesia Plan Comments:         Anesthesia Quick Evaluation                                   Anesthesia Evaluation  Patient identified by MRN, date of birth, ID band Patient awake    Reviewed: Allergy & Precautions, NPO status , Patient's Chart, lab work & pertinent test results  History  of Anesthesia Complications (+) PONV and history of anesthetic complications  Airway Mallampati: II  TM Distance: >3 FB Neck ROM: Full    Dental no notable dental hx. (+) Dental Advisory Given   Pulmonary asthma , former smoker,    Pulmonary exam normal breath sounds clear to auscultation       Cardiovascular negative cardio ROS Normal cardiovascular exam Rhythm:Regular Rate:Normal     Neuro/Psych negative neurological ROS  negative psych ROS   GI/Hepatic Neg liver ROS, GERD  Controlled and Medicated,  Endo/Other  negative endocrine ROS  Renal/GU negative Renal ROS  negative genitourinary   Musculoskeletal  (+) Arthritis ,   Abdominal   Peds negative pediatric ROS (+)  Hematology negative  hematology ROS (+)   Anesthesia Other Findings   Reproductive/Obstetrics negative OB ROS                            Anesthesia Physical Anesthesia Plan  ASA: II  Anesthesia Plan: General   Post-op Pain Management:    Induction: Intravenous  Airway Management Planned: LMA  Additional Equipment:   Intra-op Plan:   Post-operative Plan: Extubation in OR  Informed Consent: I have reviewed the patients History and Physical, chart, labs and discussed the procedure including the risks, benefits and alternatives for the proposed anesthesia with the patient or authorized representative who has indicated his/her understanding and acceptance.   Dental advisory given  Plan Discussed with: CRNA  Anesthesia Plan Comments:         Anesthesia Quick Evaluation  Anesthesia Physical Anesthesia Plan  ASA: II  Anesthesia Plan: General   Post-op Pain Management:    Induction: Intravenous  PONV Risk Score and Plan: 3 and Ondansetron  Airway Management Planned: LMA  Additional Equipment:   Intra-op Plan:   Post-operative Plan: Extubation in OR  Informed Consent: I have reviewed the patients History and Physical, chart, labs and discussed the procedure including the risks, benefits and alternatives for the proposed anesthesia with the patient or authorized representative who has indicated his/her understanding and acceptance.   Dental advisory given  Plan Discussed with: CRNA and Surgeon  Anesthesia Plan Comments: (See my PAT note. Myra Gianotti, PA-C)       Anesthesia Quick Evaluation

## 2018-05-15 NOTE — H&P (Signed)
Rebecca Guzman Appointment: 04/25/2018 9:00 AM Location: [facility] Patient #: [id number] DOB: 09-29-1933 Widowed / Language: Rebecca Guzman / Race: White Female   History of Present Illness [name] MD; 04/25/2018 11:48 AM) The patient is a 84 year old female who presents with breast cancer. Pt is an 84 yo F referred by Dr. Rosana Hoes for a screening detected right breast mass. She had diagnostic imaging that showed a 6 mm lesion at 1 o'clock. She had breast density C. Core needle biopsy showed a grade 1 invasive ductal carcinoma, ER/PR +, Her 2 -, Ki 67 1%.   Of note, she had a left mastectomy and chemotherapy in 1992 for cancer. Her half sister also had breast cancer. Her mother had colon cancer and her sister had colon polyps. She also has a family history of pancreatic cancer. She had menopause at age 64. She is a G4P4, with first child in early 52s. Menopause was in early 10s.    dx mammogram/us 04/16/2018 ACR Breast Density Category c: The breast tissue is heterogeneously dense, which may obscure small masses.  FINDINGS: Additional imaging of the right breast was performed showing persistence of a spiculated 5 mm mass in the upper inner quadrant of the right breast, middle depth. There are no malignant type microcalcifications.  On physical exam, no mass is palpated in the upper-inner quadrant of the right breast.  Targeted ultrasound is performed, showing a spiculated hypoechoic mass in the right breast at 1 o'clock 7 cm from the nipple measuring 5 x 5 x 6 mm. Sonographic evaluation the right axilla does not show any enlarged adenopathy.  IMPRESSION: Suspicious right breast mass.   pathology 04/17/2018 Diagnosis Breast, right, needle core biopsy, 1 o'clock - INVASIVE DUCTAL CARCINOMA. - DUCTAL CARCINOMA IN SITU.  The carcinoma appears grade I. The tumor cells are negative for Her2 (1+). Estrogen Receptor: 100%, POSITIVE, STRONG STAINING  INTENSITY Progesterone Receptor: 100%, POSITIVE, STRONG STAINING INTENSITY Proliferation Marker Ki67: <1%  CBc, CMET essentially normal   Past Surgical History [name], MD; 04/24/2018 1:58 PM) Breast Biopsy  Right. Gallbladder Surgery - Laparoscopic  Hysterectomy (not due to cancer) - Complete  Mastectomy  Left.  Diagnostic Studies History [name], MD; 04/24/2018 1:58 PM) Colonoscopy  1-5 years ago Mammogram  within last year Pap Smear  >5 years ago  Social History [name], MD; 04/24/2018 1:58 PM) Caffeine use  Carbonated beverages, Coffee, Tea. No alcohol use  No drug use  Tobacco use  Former smoker.  Family History [name], MD; 04/24/2018 1:58 PM) Breast Cancer  Family Members In [place]  Mother. Colon Polyps  Sister. Diabetes Mellitus  Sister. Hypertension  Mother, Sister. Malignant Neoplasm Of Pancreas  Family Members In General. Thyroid problems  Family Members In General.  Pregnancy / Birth History [name], MD; 04/24/2018 1:58 PM) Age at menarche  82 years. Age of menopause  51-55 Contraceptive History  Oral contraceptives. Gravida  4 Maternal age  25-25 Para  4 Regular periods   Other Problems [name], MD; 04/24/2018 1:58 PM) Arthritis  Back Pain  Bladder Problems  Breast Cancer  Cholelithiasis  Gastroesophageal Reflux Disease  Hypercholesterolemia  Migraine Headache     Review of Systems [name] MD; 04/24/2018 1:58 PM) General Not Present- Appetite Loss, Chills, Fatigue, Fever, Night Sweats, Weight Gain and Weight Loss. Skin Not Present- Change in Wart/Mole, Dryness, Hives, Jaundice, New Lesions, Non-Healing Wounds, Rash and Ulcer. HEENT Present- Hearing Loss, Seasonal Allergies and Wears glasses/contact  lenses. Not Present- Earache, Hoarseness, Nose Bleed, Oral Ulcers, Ringing in the Ears, Sinus Pain, Sore Throat, Visual Disturbances and Yellow Eyes. Respiratory Not  Present- Bloody sputum, Chronic Cough, Difficulty Breathing, Snoring and Wheezing. Breast Not Present- Breast Mass, Breast Pain, Nipple Discharge and Skin Changes. Cardiovascular Not Present- Chest Pain, Difficulty Breathing Lying Down, Leg Cramps, Palpitations, Rapid Heart Rate, Shortness of Breath and Swelling of Extremities. Female Genitourinary Present- Frequency. Not Present- Nocturia, Painful Urination, Pelvic Pain and Urgency. Musculoskeletal Present- Back Pain. Not Present- Joint Pain, Joint Stiffness, Muscle Pain, Muscle Weakness and Swelling of Extremities. Neurological Not Present- Decreased Memory, Fainting, Headaches, Numbness, Seizures, Tingling, Tremor, Trouble walking and Weakness. Psychiatric Not Present- Anxiety, Bipolar, Change in Sleep Pattern, Depression, Fearful and Frequent crying. Endocrine Not Present- Cold Intolerance, Excessive Hunger, Hair Changes, Heat Intolerance, Hot flashes and New Diabetes. Hematology Present- Easy Bruising. Not Present- Blood Thinners, Excessive bleeding, Gland problems, HIV and Persistent Infections.  Vitals [name] MD; 04/25/2018 11:48 AM) 04/25/2018 11:47 AM Weight: 174.1 lb Height: 65in Body Surface Area: 1.86 m Body Mass Index: 28.97 kg/m  Temp.: 98.72F  Pulse: 87 (Regular)  Resp.: 17 (Unlabored)  BP: 149/84 (Sitting, Left Arm, Standard)       Physical Exam [name] MD; 04/25/2018 11:56 AM) General Mental Status-Alert. General Appearance-Consistent with stated age. Hydration-Well hydrated. Voice-Normal.  Head and Neck Head-normocephalic, atraumatic with no lesions or palpable masses. Trachea-midline. Thyroid Gland Characteristics - normal size and consistency.  Eye Eyeball - Bilateral-Extraocular movements intact. Sclera/Conjunctiva - Bilateral-No scleral icterus.  Chest and Lung Exam Chest and lung exam reveals -quiet, even and easy respiratory effort with no use of accessory  muscles and on auscultation, normal breath sounds, no adventitious sounds and normal vocal resonance. Inspection Chest Wall - Normal. Back - normal.  Breast Note: left breast surgically absent. no chest wall nodules. Right breast with bruising superiorly. no palpable masses. no skin dimpling. no nipple retraction or nipple discharge. no LAD.   Cardiovascular Cardiovascular examination reveals -normal heart sounds, regular rate and rhythm with no murmurs and normal pedal pulses bilaterally.  Abdomen Inspection Inspection of the abdomen reveals - No Hernias. Palpation/Percussion Palpation and Percussion of the abdomen reveal - Soft, Non Tender, No Rebound tenderness, No Rigidity (guarding) and No hepatosplenomegaly. Auscultation Auscultation of the abdomen reveals - Bowel sounds normal.  Neurologic Neurologic evaluation reveals -alert and oriented x 3 with no impairment of recent or remote memory. Mental Status-Normal.  Musculoskeletal Global Assessment -Note: no gross deformities.  Normal Exam - Left-Upper Extremity Strength Normal and Lower Extremity Strength Normal. Normal Exam - Right-Upper Extremity Strength Normal and Lower Extremity Strength Normal.  Lymphatic Head & Neck  General Head & Neck Lymphatics: Bilateral - Description - Normal. Axillary  General Axillary Region: Bilateral - Description - Normal. Tenderness - Non Tender. Femoral & Inguinal  Generalized Femoral & Inguinal Lymphatics: Bilateral - Description - No Generalized lymphadenopathy.    Assessment & Plan [name] MD; 04/25/2018 11:57 AM) MALIGNANT NEOPLASM OF UPPER-INNER QUADRANT OF RIGHT BREAST IN FEMALE, ESTROGEN RECEPTOR POSITIVE (C50.211) Impression: Pt is a good candidate for lumpectomy for her new cT1bN0 right breast cancer. We will leave off sentinel lymph node biopsy given age, low grade, and small size of tumor.  I discussed seed localized lumpectomy with patient. The  surgical procedure was described to the patient. I discussed the incision type and location and that we would need radiology involved on with a seed marker. This would be followed by antihormone therapy +/-  XRT depending on final pathology.  The risks and benefits of the procedure were described to the patient and she wishes to proceed.  We discussed the risks bleeding, infection, damage to other structures, need for further procedures/surgeries. We discussed the risk of seroma. The patient was advised if the area in the breast in cancer, we may need to go back to surgery for additional tissue to obtain negative margins or for a lymph node biopsy. The patient was advised that these are the most common complications, but that others can occur as well. They were advised against taking aspirin or other anti-inflammatory agents/blood thinners the week before surgery. Current Plans Pt Education - flb breast cancer surgery: discussed with patient and provided information. FAMILY HISTORY OF CANCER (Z80.9) Impression: Given personal history of breast cancer, family history of colon cancer, breast cancer, and pancreatic cancer, would refer to genetics. Current Plans Referred to Genetic Counseling, for evaluation and follow up (Medical Genetics). Routine.   Signed by [name], MD (04/25/2018 11:58 AM)

## 2018-05-16 ENCOUNTER — Ambulatory Visit
Admission: RE | Admit: 2018-05-16 | Discharge: 2018-05-16 | Disposition: A | Discharge status: Home / Self Care | Payer: Medicare Other | Source: Ambulatory Visit | Attending: General Surgery | Admitting: General Surgery

## 2018-05-16 DIAGNOSIS — Z170 Estrogen receptor positive status [ER+]: Principal | ICD-10-CM

## 2018-05-16 DIAGNOSIS — C50211 Malignant neoplasm of upper-inner quadrant of right female breast: Secondary | ICD-10-CM

## 2018-05-17 ENCOUNTER — Other Ambulatory Visit: Payer: Self-pay

## 2018-05-17 ENCOUNTER — Encounter (HOSPITAL_COMMUNITY)
Admission: RE | Disposition: A | Discharge status: Home / Self Care | Payer: Self-pay | Source: Ambulatory Visit | Attending: General Surgery

## 2018-05-17 ENCOUNTER — Ambulatory Visit (HOSPITAL_COMMUNITY): Payer: Medicare Other | Admitting: Certified Registered Nurse Anesthetist

## 2018-05-17 ENCOUNTER — Ambulatory Visit (HOSPITAL_COMMUNITY): Payer: Medicare Other | Admitting: Vascular Surgery

## 2018-05-17 ENCOUNTER — Ambulatory Visit
Admission: RE | Admit: 2018-05-17 | Discharge: 2018-05-17 | Disposition: A | Discharge status: Home / Self Care | Payer: Medicare Other | Source: Ambulatory Visit | Attending: General Surgery | Admitting: General Surgery

## 2018-05-17 ENCOUNTER — Encounter (HOSPITAL_COMMUNITY): Payer: Self-pay | Admitting: *Deleted

## 2018-05-17 ENCOUNTER — Ambulatory Visit (HOSPITAL_COMMUNITY)
Admission: RE | Admit: 2018-05-17 | Discharge: 2018-05-17 | Disposition: A | Discharge status: Home / Self Care | Payer: Medicare Other | Source: Ambulatory Visit | Attending: General Surgery | Admitting: General Surgery

## 2018-05-17 DIAGNOSIS — Z885 Allergy status to narcotic agent status: Secondary | ICD-10-CM | POA: Diagnosis not present

## 2018-05-17 DIAGNOSIS — Z803 Family history of malignant neoplasm of breast: Secondary | ICD-10-CM | POA: Insufficient documentation

## 2018-05-17 DIAGNOSIS — Z87891 Personal history of nicotine dependence: Secondary | ICD-10-CM | POA: Diagnosis not present

## 2018-05-17 DIAGNOSIS — C50211 Malignant neoplasm of upper-inner quadrant of right female breast: Secondary | ICD-10-CM | POA: Insufficient documentation

## 2018-05-17 DIAGNOSIS — E7800 Pure hypercholesterolemia, unspecified: Secondary | ICD-10-CM | POA: Diagnosis not present

## 2018-05-17 DIAGNOSIS — K219 Gastro-esophageal reflux disease without esophagitis: Secondary | ICD-10-CM | POA: Diagnosis not present

## 2018-05-17 DIAGNOSIS — M1990 Unspecified osteoarthritis, unspecified site: Secondary | ICD-10-CM | POA: Insufficient documentation

## 2018-05-17 DIAGNOSIS — Z9221 Personal history of antineoplastic chemotherapy: Secondary | ICD-10-CM | POA: Insufficient documentation

## 2018-05-17 DIAGNOSIS — Z853 Personal history of malignant neoplasm of breast: Secondary | ICD-10-CM | POA: Insufficient documentation

## 2018-05-17 DIAGNOSIS — Z170 Estrogen receptor positive status [ER+]: Secondary | ICD-10-CM | POA: Insufficient documentation

## 2018-05-17 DIAGNOSIS — Z9012 Acquired absence of left breast and nipple: Secondary | ICD-10-CM | POA: Diagnosis not present

## 2018-05-17 DIAGNOSIS — Z79899 Other long term (current) drug therapy: Secondary | ICD-10-CM | POA: Insufficient documentation

## 2018-05-17 HISTORY — PX: BREAST LUMPECTOMY: SHX2

## 2018-05-17 HISTORY — PX: BREAST LUMPECTOMY WITH RADIOACTIVE SEED LOCALIZATION: SHX6424

## 2018-05-17 SURGERY — BREAST LUMPECTOMY WITH RADIOACTIVE SEED LOCALIZATION
Anesthesia: General | Site: Breast | Laterality: Right

## 2018-05-17 MED ORDER — SCOPOLAMINE 1 MG/3DAYS TD PT72
MEDICATED_PATCH | TRANSDERMAL | Status: DC | PRN
  Administered 2018-05-17: 1 via TRANSDERMAL

## 2018-05-17 MED ORDER — LIDOCAINE 2% (20 MG/ML) 5 ML SYRINGE
INTRAMUSCULAR | Status: AC
  Filled 2018-05-17: qty 5

## 2018-05-17 MED ORDER — KETOROLAC TROMETHAMINE 15 MG/ML IJ SOLN
INTRAMUSCULAR | Status: AC
  Filled 2018-05-17: qty 1

## 2018-05-17 MED ORDER — PHENYLEPHRINE 40 MCG/ML (10ML) SYRINGE FOR IV PUSH (FOR BLOOD PRESSURE SUPPORT)
PREFILLED_SYRINGE | INTRAVENOUS | Status: DC | PRN
  Administered 2018-05-17: 80 ug via INTRAVENOUS

## 2018-05-17 MED ORDER — ACETAMINOPHEN 500 MG PO TABS
1000.0000 mg | ORAL_TABLET | ORAL | Status: DC
  Filled 2018-05-17: qty 2

## 2018-05-17 MED ORDER — CEFAZOLIN SODIUM-DEXTROSE 2-4 GM/100ML-% IV SOLN
2.0000 g | INTRAVENOUS | Status: AC
  Administered 2018-05-17: 2 g via INTRAVENOUS
  Filled 2018-05-17: qty 100

## 2018-05-17 MED ORDER — LIDOCAINE HCL 1 % IJ SOLN
INTRAMUSCULAR | Status: DC | PRN
  Administered 2018-05-17: 40 mL

## 2018-05-17 MED ORDER — ONDANSETRON HCL 4 MG/2ML IJ SOLN
INTRAMUSCULAR | Status: DC | PRN
  Administered 2018-05-17: 4 mg via INTRAVENOUS

## 2018-05-17 MED ORDER — PROPOFOL 10 MG/ML IV BOLUS
INTRAVENOUS | Status: AC
  Filled 2018-05-17: qty 20

## 2018-05-17 MED ORDER — LACTATED RINGERS IV SOLN
INTRAVENOUS | Status: DC | PRN
  Administered 2018-05-17: 12:00:00 via INTRAVENOUS

## 2018-05-17 MED ORDER — MEPERIDINE HCL 50 MG/ML IJ SOLN: 6.2500 mg | INTRAMUSCULAR | Status: DC | PRN

## 2018-05-17 MED ORDER — GABAPENTIN 300 MG PO CAPS
300.0000 mg | ORAL_CAPSULE | ORAL | Status: AC
  Administered 2018-05-17: 300 mg via ORAL
  Filled 2018-05-17: qty 1

## 2018-05-17 MED ORDER — SCOPOLAMINE 1 MG/3DAYS TD PT72
MEDICATED_PATCH | TRANSDERMAL | Status: AC
  Filled 2018-05-17: qty 1

## 2018-05-17 MED ORDER — 0.9 % SODIUM CHLORIDE (POUR BTL) OPTIME
TOPICAL | Status: DC | PRN
  Administered 2018-05-17: 1000 mL

## 2018-05-17 MED ORDER — BUPIVACAINE-EPINEPHRINE (PF) 0.25% -1:200000 IJ SOLN
INTRAMUSCULAR | Status: AC
  Filled 2018-05-17: qty 30

## 2018-05-17 MED ORDER — TRAMADOL HCL 50 MG PO TABS: 50.0000 mg | ORAL_TABLET | Freq: Three times a day (TID) | ORAL | 1 refills | Status: DC | PRN

## 2018-05-17 MED ORDER — LIDOCAINE HCL (PF) 1 % IJ SOLN
INTRAMUSCULAR | Status: AC
  Filled 2018-05-17: qty 30

## 2018-05-17 MED ORDER — KETOROLAC TROMETHAMINE 15 MG/ML IJ SOLN
15.0000 mg | Freq: Once | INTRAMUSCULAR | Status: AC
  Administered 2018-05-17: 15 mg via INTRAVENOUS

## 2018-05-17 MED ORDER — PROPOFOL 10 MG/ML IV BOLUS
INTRAVENOUS | Status: DC | PRN
  Administered 2018-05-17: 130 mg via INTRAVENOUS

## 2018-05-17 MED ORDER — LACTATED RINGERS IV SOLN
Freq: Once | INTRAVENOUS | Status: AC
  Administered 2018-05-17: 11:00:00 via INTRAVENOUS

## 2018-05-17 MED ORDER — LIDOCAINE 2% (20 MG/ML) 5 ML SYRINGE
INTRAMUSCULAR | Status: DC | PRN
  Administered 2018-05-17: 100 mg via INTRAVENOUS

## 2018-05-17 MED ORDER — DEXAMETHASONE SODIUM PHOSPHATE 10 MG/ML IJ SOLN
INTRAMUSCULAR | Status: AC
  Filled 2018-05-17: qty 1

## 2018-05-17 MED ORDER — PROMETHAZINE HCL 25 MG/ML IJ SOLN: 12.5000 mg | Freq: Once | INTRAMUSCULAR | Status: DC | PRN

## 2018-05-17 MED ORDER — ONDANSETRON HCL 4 MG/2ML IJ SOLN
INTRAMUSCULAR | Status: AC
  Filled 2018-05-17: qty 2

## 2018-05-17 MED ORDER — FENTANYL CITRATE (PF) 100 MCG/2ML IJ SOLN: 25.0000 ug | INTRAMUSCULAR | Status: DC | PRN

## 2018-05-17 MED ORDER — FENTANYL CITRATE (PF) 100 MCG/2ML IJ SOLN
INTRAMUSCULAR | Status: DC | PRN
  Administered 2018-05-17: 25 ug via INTRAVENOUS
  Administered 2018-05-17: 50 ug via INTRAVENOUS

## 2018-05-17 MED ORDER — DEXAMETHASONE SODIUM PHOSPHATE 10 MG/ML IJ SOLN
INTRAMUSCULAR | Status: DC | PRN
  Administered 2018-05-17: 10 mg via INTRAVENOUS

## 2018-05-17 MED ORDER — FENTANYL CITRATE (PF) 250 MCG/5ML IJ SOLN
INTRAMUSCULAR | Status: AC
  Filled 2018-05-17: qty 5

## 2018-05-17 MED ORDER — PHENYLEPHRINE 40 MCG/ML (10ML) SYRINGE FOR IV PUSH (FOR BLOOD PRESSURE SUPPORT)
PREFILLED_SYRINGE | INTRAVENOUS | Status: AC
  Filled 2018-05-17: qty 10

## 2018-05-17 SURGICAL SUPPLY — 50 items
BENZOIN TINCTURE PRP APPL 2/3 (GAUZE/BANDAGES/DRESSINGS) ×3 IMPLANT
BINDER BREAST LRG (GAUZE/BANDAGES/DRESSINGS) ×3 IMPLANT
BINDER BREAST XLRG (GAUZE/BANDAGES/DRESSINGS) IMPLANT
BLADE SURG 10 STRL SS (BLADE) ×3 IMPLANT
CANISTER SUCT 3000ML PPV (MISCELLANEOUS) IMPLANT
CHLORAPREP W/TINT 26ML (MISCELLANEOUS) ×3 IMPLANT
CLIP VESOCCLUDE LG 6/CT (CLIP) ×3 IMPLANT
CLIP VESOCCLUDE MED 6/CT (CLIP) ×3 IMPLANT
CLOSURE WOUND 1/2 X4 (GAUZE/BANDAGES/DRESSINGS) ×1
COVER PROBE W GEL 5X96 (DRAPES) ×3 IMPLANT
COVER SURGICAL LIGHT HANDLE (MISCELLANEOUS) ×3 IMPLANT
COVER WAND RF STERILE (DRAPES) ×3 IMPLANT
DERMABOND ADVANCED (GAUZE/BANDAGES/DRESSINGS) ×2
DERMABOND ADVANCED .7 DNX12 (GAUZE/BANDAGES/DRESSINGS) ×1 IMPLANT
DEVICE DUBIN SPECIMEN MAMMOGRA (MISCELLANEOUS) ×3 IMPLANT
DRAPE CHEST BREAST 15X10 FENES (DRAPES) ×3 IMPLANT
DRAPE UTILITY XL STRL (DRAPES) ×3 IMPLANT
DRSG PAD ABDOMINAL 8X10 ST (GAUZE/BANDAGES/DRESSINGS) ×3 IMPLANT
ELECT CAUTERY BLADE 6.4 (BLADE) ×3 IMPLANT
ELECT REM PT RETURN 9FT ADLT (ELECTROSURGICAL) ×3
ELECTRODE REM PT RTRN 9FT ADLT (ELECTROSURGICAL) ×1 IMPLANT
GAUZE SPONGE 4X4 12PLY STRL LF (GAUZE/BANDAGES/DRESSINGS) ×3 IMPLANT
GLOVE BIO SURGEON STRL SZ 6 (GLOVE) ×3 IMPLANT
GLOVE INDICATOR 6.5 STRL GRN (GLOVE) ×3 IMPLANT
GOWN STRL REUS W/ TWL LRG LVL3 (GOWN DISPOSABLE) ×1 IMPLANT
GOWN STRL REUS W/TWL 2XL LVL3 (GOWN DISPOSABLE) ×3 IMPLANT
GOWN STRL REUS W/TWL LRG LVL3 (GOWN DISPOSABLE) ×2
KIT BASIN OR (CUSTOM PROCEDURE TRAY) ×3 IMPLANT
KIT MARKER MARGIN INK (KITS) ×3 IMPLANT
LIGHT WAVEGUIDE WIDE FLAT (MISCELLANEOUS) IMPLANT
NEEDLE HYPO 25GX1X1/2 BEV (NEEDLE) ×3 IMPLANT
NS IRRIG 1000ML POUR BTL (IV SOLUTION) IMPLANT
PACK SURGICAL SETUP 50X90 (CUSTOM PROCEDURE TRAY) ×3 IMPLANT
PAD ABD 8X10 STRL (GAUZE/BANDAGES/DRESSINGS) ×3 IMPLANT
PENCIL BUTTON HOLSTER BLD 10FT (ELECTRODE) ×3 IMPLANT
SPONGE LAP 18X18 X RAY DECT (DISPOSABLE) ×3 IMPLANT
STRIP CLOSURE SKIN 1/2X4 (GAUZE/BANDAGES/DRESSINGS) ×2 IMPLANT
SUT MNCRL AB 4-0 PS2 18 (SUTURE) ×3 IMPLANT
SUT SILK 2 0 SH (SUTURE) IMPLANT
SUT VIC AB 2-0 SH 27 (SUTURE) ×2
SUT VIC AB 2-0 SH 27XBRD (SUTURE) ×1 IMPLANT
SUT VIC AB 3-0 SH 27 (SUTURE) ×2
SUT VIC AB 3-0 SH 27X BRD (SUTURE) ×1 IMPLANT
SYR BULB 3OZ (MISCELLANEOUS) ×3 IMPLANT
SYR CONTROL 10ML LL (SYRINGE) ×3 IMPLANT
TOWEL OR 17X24 6PK STRL BLUE (TOWEL DISPOSABLE) ×3 IMPLANT
TOWEL OR 17X26 10 PK STRL BLUE (TOWEL DISPOSABLE) ×3 IMPLANT
TUBE CONNECTING 12'X1/4 (SUCTIONS)
TUBE CONNECTING 12X1/4 (SUCTIONS) IMPLANT
YANKAUER SUCT BULB TIP NO VENT (SUCTIONS) IMPLANT

## 2018-05-17 NOTE — Transfer of Care (Signed)
Immediate Anesthesia Transfer of Care Note  Patient: Rebecca Guzman  Procedure(s) Performed: RIGHT BREAST LUMPECTOMY WITH RADIOACTIVE SEED LOCALIZATION (Right Breast)  Patient Location: PACU  Anesthesia Type:General  Level of Consciousness: awake, alert , oriented and unresponsive  Airway & Oxygen Therapy: Patient Spontanous Breathing and Patient connected to nasal cannula oxygen  Post-op Assessment: Report given to RN, Post -op Vital signs reviewed and stable and Patient moving all extremities  Post vital signs: Reviewed and stable  Last Vitals:  Vitals Value Taken Time  BP    Temp    Pulse    Resp    SpO2      Last Pain:  Vitals:   05/17/18 1033  TempSrc:   PainSc: 0-No pain      Patients Stated Pain Goal: 3 (08/56/94 3700)  Complications: No apparent anesthesia complications

## 2018-05-17 NOTE — Discharge Instructions (Addendum)
[facility] °Office Phone Number [phone] ° °BREAST BIOPSY/ PARTIAL MASTECTOMY: POST OP INSTRUCTIONS ° °Always review your discharge instruction sheet given to you by the facility where your surgery was performed. ° °IF YOU HAVE DISABILITY OR FAMILY LEAVE FORMS, YOU MUST BRING THEM TO THE OFFICE FOR PROCESSING.  DO NOT GIVE THEM TO YOUR DOCTOR. ° °1. A prescription for pain medication may be given to you upon discharge.  Take your pain medication as prescribed, if needed.  If narcotic pain medicine is not needed, then you may take acetaminophen (Tylenol) or ibuprofen (Advil) as needed. °2. Take your usually prescribed medications unless otherwise directed °3. If you need a refill on your pain medication, please contact your pharmacy.  They will contact our office to request authorization.  Prescriptions will not be filled after 5pm or on week-ends. °4. You should eat very light the first 24 hours after surgery, such as soup, crackers, pudding, etc.  Resume your normal diet the day after surgery. °5. Most patients will experience some swelling and bruising in the breast.  Ice packs and a good support bra will help.  Swelling and bruising can take several days to resolve.  °6. It is common to experience some constipation if taking pain medication after surgery.  Increasing fluid intake and taking a stool softener will usually help or prevent this problem from occurring.  A mild laxative (Milk of Magnesia or Miralax) should be taken according to package directions if there are no bowel movements after 48 hours. °7. Unless discharge instructions indicate otherwise, you may remove your bandages 48 hours after surgery, and you may shower at that time.  You may have steri-strips (small skin tapes) in place directly over the incision.  These strips should be left on the skin for 7-10 days.   Any sutures or staples will be removed at the office during your follow-up visit. °8. ACTIVITIES:  You may resume  regular daily activities (gradually increasing) beginning the next day.  Wearing a good support bra or sports bra (or the breast binder) minimizes pain and swelling.  You may have sexual intercourse when it is comfortable. °a. You may drive when you no longer are taking prescription pain medication, you can comfortably wear a seatbelt, and you can safely maneuver your car and apply brakes. °b. RETURN TO WORK:  __________1 week_______________ °9. You should see your doctor in the office for a follow-up appointment approximately two weeks after your surgery.  Your doctor’s nurse will typically make your follow-up appointment when she calls you with your pathology report.  Expect your pathology report 2-3 business days after your surgery.  You may call to check if you do not hear from us after three days. ° ° °WHEN TO CALL YOUR DOCTOR: °1. Fever over 101.0 °2. Nausea and/or vomiting. °3. Extreme swelling or bruising. °4. Continued bleeding from incision. °5. Increased pain, redness, or drainage from the incision. ° °The clinic staff is available to answer your questions during regular business hours.  Please don’t hesitate to call and ask to speak to one of the nurses for clinical concerns.  If you have a medical emergency, go to the nearest emergency room or call 911.  A surgeon from [facility] is always on call at the hospital. ° °For further questions, please visit [email or web address]  ° °

## 2018-05-17 NOTE — Op Note (Signed)
Right Breast Radioactive seed localized lumpectomy  Indications: This patient presents with history of right breast cancer, upper inner quadrant, low grade, cT1bN0, +/+/-  Pre-operative Diagnosis: right breast cancer  Post-operative Diagnosis: Same  Surgeon: [name]   Anesthesia: General endotracheal anesthesia  ASA Class: 2  Procedure Details  The patient was seen in the Holding Room. The risks, benefits, complications, treatment options, and expected outcomes were discussed with the patient. The possibilities of bleeding, infection, the need for additional procedures, failure to diagnose a condition, and creating a complication requiring transfusion or operation were discussed with the patient. The patient concurred with the proposed plan, giving informed consent.  The site of surgery properly noted/marked. The patient was taken to Operating Room # 2, identified, and the procedure verified as Right Breast seed localized Lumpectomy. A Time Out was held and the above information confirmed.  The right breast and chest were prepped and draped in standard fashion. The lumpectomy was performed by creating a superomedial circumareola incision below the previously placed radioactive seed.  Dissection was carried down to around the point of maximum signal intensity. The cautery was used to perform the dissection.  Hemostasis was achieved with cautery. The edges of the cavity were marked with large clips, with one each medial, lateral, inferior and superior, and two clips posteriorly.   The specimen was inked with the margin marker paint kit.    Specimen radiography confirmed inclusion of the mammographic lesion, the clip, and the seed.  The background signal in the breast was zero.  The wound was irrigated and closed with 3-0 vicryl in layers and 4-0 monocryl subcuticular suture.      Sterile dressings were applied. At the end of the operation, all sponge, instrument, and needle counts were  correct.  Findings: grossly clear surgical margins, anterior margin is skin, posterior margin is pectoralis.    Estimated Blood Loss:  min         Specimens: right breast lumpectomy with seed         Complications:  None; patient tolerated the procedure well.         Disposition: PACU - hemodynamically stable.         Condition: stable

## 2018-05-17 NOTE — Interval H&P Note (Signed)
History and Physical Interval Note:  05/17/2018 11:40 AM  Rebecca Guzman  has presented today for surgery, with the diagnosis of RIGHT BREAST CANCER  The various methods of treatment have been discussed with the patient and family. After consideration of risks, benefits and other options for treatment, the patient has consented to  Procedure(s): BREAST LUMPECTOMY WITH RADIOACTIVE SEED LOCALIZATION (Right) as a surgical intervention .  The patient's history has been reviewed, patient examined, no change in status, stable for surgery.  I have reviewed the patient's chart and labs.  Questions were answered to the patient's satisfaction.     [name]

## 2018-05-17 NOTE — Anesthesia Procedure Notes (Signed)
Procedure Name: LMA Insertion Date/Time: 05/17/2018 12:01 PM Performed by: Lowella Dell, CRNA Pre-anesthesia Checklist: Patient identified, Emergency Drugs available, Suction available and Patient being monitored Patient Re-evaluated:Patient Re-evaluated prior to induction Oxygen Delivery Method: Circle System Utilized Preoxygenation: Pre-oxygenation with 100% oxygen Induction Type: IV induction Ventilation: Mask ventilation without difficulty LMA: LMA inserted LMA Size: 4.0 Number of attempts: 1 Airway Equipment and Method: Bite block Placement Confirmation: positive ETCO2 Tube secured with: Tape Dental Injury: Teeth and Oropharynx as per pre-operative assessment

## 2018-05-17 NOTE — Anesthesia Postprocedure Evaluation (Signed)
Anesthesia Post Note  Patient: Rebecca Guzman  Procedure(s) Performed: RIGHT BREAST LUMPECTOMY WITH RADIOACTIVE SEED LOCALIZATION (Right Breast)     Patient location during evaluation: PACU Anesthesia Type: General Level of consciousness: awake Pain management: pain level controlled Vital Signs Assessment: post-procedure vital signs reviewed and stable Respiratory status: spontaneous breathing Postop Assessment: no apparent nausea or vomiting Anesthetic complications: no    Last Vitals:  Vitals:   05/17/18 1330 05/17/18 1345  BP: (!) 146/77   Pulse: 65   Resp: 15 14  Temp:    SpO2: 99% 100%    Last Pain:  Vitals:   05/17/18 1345  TempSrc:   PainSc: 0-No pain   Pain Goal: Patients Stated Pain Goal: 3 (05/17/18 1033)               [place]

## 2018-05-18 ENCOUNTER — Encounter (HOSPITAL_COMMUNITY): Payer: Self-pay | Admitting: General Surgery

## 2018-05-21 ENCOUNTER — Inpatient Hospital Stay: Payer: Medicare Other | Attending: Hematology and Oncology | Admitting: Genetic Counselor

## 2018-05-21 ENCOUNTER — Inpatient Hospital Stay: Payer: Medicare Other

## 2018-05-21 DIAGNOSIS — C50211 Malignant neoplasm of upper-inner quadrant of right female breast: Secondary | ICD-10-CM

## 2018-05-21 DIAGNOSIS — Z853 Personal history of malignant neoplasm of breast: Secondary | ICD-10-CM | POA: Diagnosis not present

## 2018-05-21 DIAGNOSIS — Z1379 Encounter for other screening for genetic and chromosomal anomalies: Secondary | ICD-10-CM

## 2018-05-21 DIAGNOSIS — Z170 Estrogen receptor positive status [ER+]: Secondary | ICD-10-CM

## 2018-05-21 DIAGNOSIS — Z79899 Other long term (current) drug therapy: Secondary | ICD-10-CM | POA: Insufficient documentation

## 2018-05-21 DIAGNOSIS — M810 Age-related osteoporosis without current pathological fracture: Secondary | ICD-10-CM | POA: Insufficient documentation

## 2018-05-21 DIAGNOSIS — Z800 Family history of malignant neoplasm of digestive organs: Secondary | ICD-10-CM

## 2018-05-21 DIAGNOSIS — Z86010 Personal history of colonic polyps: Secondary | ICD-10-CM

## 2018-05-21 DIAGNOSIS — Z803 Family history of malignant neoplasm of breast: Secondary | ICD-10-CM

## 2018-05-21 NOTE — Progress Notes (Signed)
Please let patient know margins are negative!

## 2018-05-23 ENCOUNTER — Encounter: Payer: Self-pay | Admitting: Genetic Counselor

## 2018-05-23 DIAGNOSIS — Z800 Family history of malignant neoplasm of digestive organs: Secondary | ICD-10-CM | POA: Insufficient documentation

## 2018-05-23 DIAGNOSIS — Z803 Family history of malignant neoplasm of breast: Secondary | ICD-10-CM | POA: Insufficient documentation

## 2018-05-23 NOTE — Progress Notes (Signed)
REFERRING PROVIDER: [name], MD [place], [place] [id number]  PRIMARY PROVIDER:  [name], DO  PRIMARY REASON FOR VISIT:  1. Malignant neoplasm of upper-inner quadrant of right breast in female, estrogen receptor positive (Rebecca Guzman)   2. Family history of breast cancer   3. Family history of colon cancer   4. Family history of pancreatic cancer   5. History of breast cancer   6. History of adenomatous polyp of colon      HISTORY OF PRESENT ILLNESS:   Rebecca Guzman, a 84 y.o. female, was seen for a [facility] cancer genetics consultation at the request of Dr. Lindi Guzman due to a personal and family history of cancer.  Rebecca Guzman presents to clinic today to discuss the possibility of a hereditary predisposition to cancer, genetic testing, and to further clarify her future cancer risks, as well as potential cancer risks for family members.   In 1992, at the age of 48, Rebecca Guzman was diagnosed with cancer of the left breast.  The tumor was ER+. This was treated with chemotherapy and mastectomy, and tamoxifen for 5 years.  In 2019,  At the age of 53, Rebecca Guzman was diagnosed with cancer of the right breast.  This will be treated with lumpectomy and hormone treatment.  She reports having colonoscopies, and having between 5-10 colon polyps.    CANCER HISTORY:    Malignant neoplasm of upper-inner quadrant of right breast in female, estrogen receptor positive (Rebecca Guzman)   1992 Miscellaneous    Left breast cancer treated with mastectomy followed by adjuvant chemotherapy and 5 years of tamoxifen    04/17/2018 Initial Diagnosis    Screening detected right breast mass, by ultrasound measured 5 mm UIQ middle depth, biopsy revealed grade 1 IDC with DCIS, ER 100%, PR 100%, HER-2 -1+ by IHC, T1 a N0 stage I a clinical stage      HORMONAL RISK FACTORS:  Menarche was at age 89.  First live birth at age 52.  OCP use for approximately 0 years.  Ovaries intact: No.   Hysterectomy: yes.  Menopausal status: postmenopausal.  HRT use: 0 years. Colonoscopy: yes; 5-10 polyps. Mammogram within the last year: yes. Number of breast biopsies: 2. Up to date with pelvic exams:  n/a. Any excessive radiation exposure in the past:  no  Past Medical History:  Diagnosis Date  . Asthma   . BPPV (benign paroxysmal positional vertigo)   . Compression fracture of L3 lumbar vertebra 04/06/2015  . Family history of breast cancer   . Family history of colon cancer   . Family history of pancreatic cancer   . GERD (gastroesophageal reflux disease)   . Hiatal hernia    pt states she doesnt have this  . History of adenomatous polyp of colon    tubular adenoma 2005 and  2014 tubular adenoma and  hyperplastic polyp  . History of breast cancer per pt no recurrence   dx 1992 --  s/p  left breast mastectomy and chemotherapy  . Hyperlipidemia LDL goal <100   . Osteoarthritis of right knee   . Personal history of chemotherapy 1992  . PONV (postoperative nausea and vomiting)    and "gets weak and light-leaded"  . Sensation of pressure in bladder area   . Vaginal vault prolapse    anterior  . Wears glasses   . Wears hearing aid    left only    Past Surgical History:  Procedure Laterality Date  . BREAST  LUMPECTOMY WITH RADIOACTIVE SEED LOCALIZATION Right 05/17/2018   Procedure: RIGHT BREAST LUMPECTOMY WITH RADIOACTIVE SEED LOCALIZATION;  Surgeon: Rebecca Klein, MD;  Location: [facility];  Service: General;  Laterality: Right;  . CATARACT EXTRACTION W/ INTRAOCULAR LENS  IMPLANT, BILATERAL  2014  . CHOLECYSTECTOMY  1985  . COLONOSCOPY  last one 06-04-2013  . CYSTOCELE REPAIR N/A 02/29/2016   Procedure: ANTERIOR VAULT PROLAPSE REPAIR COLOPLAST [place] FIXATION AUGMENTED AXIS DERMIS REPAIR  ;  Surgeon: Rebecca Clines, MD;  Location: [facility];  Service: Urology;  Laterality: N/A;  . ESOPHAGOGASTRODUODENOSCOPY  08-31-2009  . LAPAROSCOPIC ASSISTED VAGINAL  HYSTERECTOMY  01-10-2000   w/ Bilateral Salpingoophorectomy /  Anterior & Posterior repair/  Pubovaginal Sling  . MASTECTOMY Left 1992    Social History   Socioeconomic History  . Marital status: Widowed    Spouse name: Not on file  . Number of children: 4  . Years of education: Not on file  . Highest education level: Not on file  Occupational History  . Occupation: Retired Product manager: RETIRED  Social Needs  . Financial resource strain: Not hard at all  . Food insecurity:    Worry: Never true    Inability: Never true  . Transportation needs:    Medical: No    Non-medical: No  Tobacco Use  . Smoking status: Former Smoker    Years: 13.00    Types: Cigarettes    Last attempt to quit: 10/12/1983    Years since quitting: 34.6  . Smokeless tobacco: Never Used  Substance and Sexual Activity  . Alcohol use: No  . Drug use: No  . Sexual activity: Not on file  Lifestyle  . Physical activity:    Days per week: 6 days    Minutes per session: 30 min  . Stress: Only a little  Relationships  . Social connections:    Talks on phone: More than three times a week    Gets together: More than three times a week    Attends religious service: More than 4 times per year    Active member of club or organization: No    Attends meetings of clubs or organizations: Never    Relationship status: Widowed  Other Topics Concern  . Not on file  Social History Narrative  . Not on file     FAMILY HISTORY:  We obtained a detailed, 4-generation family history.  Significant diagnoses are listed below: Family History  Problem Relation Age of Onset  . Heart attack Mother   . Colon cancer Mother 25  . Hypertension Mother   . Heart disease Father   . Breast cancer Sister        pat half sister dx in her 74s  . Pancreatic cancer Other 63    The patient has two sons and two daughters, who are all cancer free.  Her oldest daughter had a breast biopsy in the past that was benign.  She has  one sister who is cancer free, but has a daughter was diagnosed with pancreatic cancer at age 10.  Both parents are deceased.  The patient's mother was diagnosed with colon cancer at 29 and died at 56.  She was one of 8 children who lived to adulthood.  None had cancer.  The maternal grandparents are deceased.  The patient's father died at 16 from a heart attack.  He had two sisters and four brothers who were caner free.  His parents are deceased.  Ms. Loose is unaware of previous family history of genetic testing for hereditary cancer risks. Patient's maternal ancestors are of [place] and [place] descent, and paternal ancestors are of Caucasian [place] descent. There is no reported Ashkenazi Jewish ancestry. There is no known consanguinity.  GENETIC COUNSELING ASSESSMENT: Rebecca Guzman is a 84 y.o. female with a personal and family history of cancer which is somewhat suggestive of a hereditary cancer syndrome and predisposition to cancer. We, therefore, discussed and recommended the following at today's visit.   DISCUSSION: We discussed that about 5-10% of breast cancer is hereditary with most cases due to BRCA mutations.  There are other genes that are associated with hereditary breast and pancreatic cancer including ATM and PALB2.    We reviewed the characteristics, features and inheritance patterns of hereditary cancer syndromes. We also discussed genetic testing, including the appropriate family members to test, the process of testing, insurance coverage and turn-around-time for results. We discussed the implications of a negative, positive and/or variant of uncertain significant result. We recommended Ms. Derenzo pursue genetic testing for the multi cancer gene panel. The Multi-Gene Panel offered by [facility] includes sequencing and/or deletion duplication testing of the following 84 genes: AIP, ALK, APC, ATM, AXIN2,BAP1,  BARD1, BLM, BMPR1A, BRCA1, BRCA2, BRIP1, CASR, CDC73, CDH1, CDK4, CDKN1B, CDKN1C,  CDKN2A (p14ARF), CDKN2A (p16INK4a), CEBPA, CHEK2, CTNNA1, DICER1, DIS3L2, EGFR (c.2369C>T, p.Thr790Met variant only), EPCAM (Deletion/duplication testing only), FH, FLCN, GATA2, GPC3, GREM1 (Promoter region deletion/duplication testing only), HOXB13 (c.251G>A, p.Gly84Glu), HRAS, KIT, MAX, MEN1, MET, MITF (c.952G>A, p.Glu318Lys variant only), MLH1, MSH2, MSH3, MSH6, MUTYH, NBN, NF1, NF2, NTHL1, PALB2, PDGFRA, PHOX2B, PMS2, POLD1, POLE, POT1, PRKAR1A, PTCH1, PTEN, RAD50, RAD51C, RAD51D, RB1, RECQL4, RET, RUNX1, SDHAF2, SDHA (sequence changes only), SDHB, SDHC, SDHD, SMAD4, SMARCA4, SMARCB1, SMARCE1, STK11, SUFU, TERC, TERT, TMEM127, TP53, TSC1, TSC2, VHL, WRN and WT1.    Based on Ms. Amick's personal and family history of cancer, she meets medical criteria for genetic testing. Despite that she meets criteria, she may still have an out of pocket cost. We discussed that if her out of pocket cost for testing is over $100, the laboratory will call and confirm whether she wants to proceed with testing.  If the out of pocket cost of testing is less than $100 she will be billed by the genetic testing laboratory.   PLAN: After considering the risks, benefits, and limitations, Ms. Bullis  provided informed consent to pursue genetic testing and the blood sample was sent to [facility] for analysis of the multi cancer panel. Results should be available within approximately 2-3 weeks' time, at which point they will be disclosed by telephone to Ms. Reinhold, as will any additional recommendations warranted by these results. Ms. Nangle will receive a summary of her genetic counseling visit and a copy of her results once available. This information will also be available in Epic. We encouraged Ms. Robello to remain in contact with cancer genetics annually so that we can continuously update the family history and inform her of any changes in cancer genetics and testing that may be of benefit for her family. Ms. Brittle  questions were answered to her satisfaction today. Our contact information was provided should additional questions or concerns arise.  Lastly, we encouraged Ms. Lydon to remain in contact with cancer genetics annually so that we can continuously update the family history and inform her of any changes in cancer genetics and testing that may be of benefit for this family.   Ms.  Bickle  questions were answered to her satisfaction today. Our contact information was provided should additional questions or concerns arise. Thank you for the referral and allowing [place] to share in the care of your patient.   Karen P. Florene Glen, [place], [facility] Certified Genetic Counselor [email or web address] phone: [phone]  The patient was seen for a total of 45 minutes in face-to-face genetic counseling.  This patient was discussed with Drs. Magrinat, Rebecca Guzman and/or Burr Medico who agrees with the above.    _______________________________________________________________________ For Office Staff:  Number of people involved in session: 2 Was an Intern/ student involved with case: no

## 2018-05-28 ENCOUNTER — Telehealth: Payer: Self-pay | Admitting: Hematology and Oncology

## 2018-05-28 ENCOUNTER — Inpatient Hospital Stay: Payer: Medicare Other | Admitting: Hematology and Oncology

## 2018-05-28 VITALS — BP 137/74 | HR 87 | Temp 98.6°F | Resp 17 | Ht 65.0 in | Wt 174.1 lb

## 2018-05-28 DIAGNOSIS — Z79899 Other long term (current) drug therapy: Secondary | ICD-10-CM

## 2018-05-28 DIAGNOSIS — C50211 Malignant neoplasm of upper-inner quadrant of right female breast: Secondary | ICD-10-CM | POA: Diagnosis not present

## 2018-05-28 DIAGNOSIS — M810 Age-related osteoporosis without current pathological fracture: Secondary | ICD-10-CM | POA: Diagnosis not present

## 2018-05-28 DIAGNOSIS — Z170 Estrogen receptor positive status [ER+]: Secondary | ICD-10-CM

## 2018-05-28 MED ORDER — LETROZOLE 2.5 MG PO TABS: 2.5000 mg | ORAL_TABLET | Freq: Every day | ORAL | 3 refills | Status: DC

## 2018-05-28 NOTE — Telephone Encounter (Signed)
Gave patient avs and calendar.   °

## 2018-05-28 NOTE — Progress Notes (Signed)
Patient Care Team: [name], DO as PCP - General (Geriatric Medicine) [name], MD as PCP - Cardiology (Cardiology) [name], MD as Consulting Physician (General Surgery) [name], MD as Consulting Physician (Hematology and Oncology) [name], MD as Consulting Physician (Radiation Oncology)  DIAGNOSIS:  Encounter Diagnoses  Name Primary?  . Malignant neoplasm of upper-inner quadrant of right breast in female, estrogen receptor positive ([place])   . Age-related osteoporosis without current pathological fracture Yes    SUMMARY OF ONCOLOGIC HISTORY:   Malignant neoplasm of upper-inner quadrant of right breast in female, estrogen receptor positive ([place])   1992 Miscellaneous    Left breast cancer treated with mastectomy followed by adjuvant chemotherapy and 5 years of tamoxifen    04/17/2018 Initial Diagnosis    Screening detected right breast mass, by ultrasound measured 5 mm UIQ middle depth, biopsy revealed grade 1 IDC with DCIS, ER 100%, PR 100%, HER-2 -1+ by IHC, T1 a N0 stage I a clinical stage    05/17/2018 Surgery    Right lumpectomy: IDC, 5.5 mm, grade 1, margins negative, ER 100%, PR 100%, HER-2 1+ by IHC, Ki-67 less than 1%, T1BNX stage Ia     CHIEF COMPLIANT: Follow-up after recent right lumpectomy  INTERVAL HISTORY: Rebecca Guzman is a 84 year old with above-mentioned history of right breast cancer underwent lumpectomy recently and is here today to discuss the pathology report.  She is healing recovering very well from recent surgery.  REVIEW OF SYSTEMS:   Constitutional: Denies fevers, chills or abnormal weight loss Eyes: Denies blurriness of vision Ears, nose, mouth, throat, and face: Denies mucositis or sore throat Respiratory: Denies cough, dyspnea or wheezes Cardiovascular: Denies palpitation, chest discomfort Gastrointestinal:  Denies nausea, heartburn or change in bowel habits Skin: Denies abnormal skin rashes Lymphatics: Denies  new lymphadenopathy or easy bruising Neurological:Denies numbness, tingling or new weaknesses Behavioral/Psych: Mood is stable, no new changes  Extremities: No lower extremity edema Breast: Recent right lumpectomy All other systems were reviewed with the patient and are negative.  I have reviewed the past medical history, past surgical history, social history and family history with the patient and they are unchanged from previous note.  ALLERGIES:  is allergic to codeine and morphine and related.  MEDICATIONS:  Current Outpatient Medications  Medication Sig Dispense Refill  . acetaminophen (TYLENOL) 500 MG tablet Take 1,000 mg by mouth every morning.     . Ca Carbonate-Mag Hydroxide (ROLAIDS) 550-110 MG CHEW Chew 2 tablets by mouth daily as needed (for indigestion).     . Calcium Citrate-Vitamin D (CITRACAL + D PO) Take 2 tablets by mouth daily.    Marland Kitchen letrozole (FEMARA) 2.5 MG tablet Take 1 tablet (2.5 mg total) by mouth daily. 90 tablet 3  . loratadine (CLARITIN) 10 MG tablet Take 10 mg by mouth daily.    . metoprolol tartrate (LOPRESSOR) 25 MG tablet Take 1 tablet (25 mg total) by mouth 2 (two) times daily. (Patient taking differently: Take 12.5 mg by mouth 2 (two) times daily. ) 180 tablet 3  . Multiple Vitamin (MULTIVITAMIN) tablet Take 1 tablet by mouth daily.    . Omega-3 Fatty Acids (FISH OIL) 1000 MG CAPS Take 1 capsule (1,000 mg total) by mouth daily.  0  . omeprazole (PRILOSEC) 40 MG capsule TAKE 1 CAPSULE BY MOUTH TWICE A DAY (Patient taking differently: Take 40 mg by mouth daily. ) 60 capsule 0  . pravastatin (PRAVACHOL) 40 MG tablet TAKE 1 TABLET BY MOUTH  ONCE DAILY (Patient taking differently: Take 40 mg by mouth daily. ) 90 tablet 1  . Probiotic Product (PROBIOTIC FORMULA PO) Take 1 tablet by mouth every evening.     . traMADol (ULTRAM) 50 MG tablet Take 1 tablet (50 mg total) by mouth every 8 (eight) hours as needed for moderate pain or severe pain. 10 tablet 1   No current  facility-administered medications for this visit.     PHYSICAL EXAMINATION: ECOG PERFORMANCE STATUS: 1 - Symptomatic but completely ambulatory  Vitals:   05/28/18 0915  BP: 137/74  Pulse: 87  Resp: 17  Temp: 98.6 F (37 C)  SpO2: 97%   Filed Weights   05/28/18 0915  Weight: 174 lb 1.6 oz (79 kg)    GENERAL:alert, no distress and comfortable SKIN: skin color, texture, turgor are normal, no rashes or significant lesions EYES: normal, Conjunctiva are pink and non-injected, sclera clear OROPHARYNX:no exudate, no erythema and lips, buccal mucosa, and tongue normal  NECK: supple, thyroid normal size, non-tender, without nodularity LYMPH:  no palpable lymphadenopathy in the cervical, axillary or inguinal LUNGS: clear to auscultation and percussion with normal breathing effort HEART: regular rate & rhythm and no murmurs and no lower extremity edema ABDOMEN:abdomen soft, non-tender and normal bowel sounds MUSCULOSKELETAL:no cyanosis of digits and no clubbing  NEURO: alert & oriented x 3 with fluent speech, no focal motor/sensory deficits EXTREMITIES: No lower extremity edema   LABORATORY DATA:  I have reviewed the data as listed CMP Latest Ref Rng & Units 05/10/2018 04/25/2018 10/19/2017  Glucose 70 - 99 mg/dL 105(H) 118(H) 98  BUN 8 - 23 mg/dL _0 Creatinine 0.44 - 1.00 mg/dL 0.90 0.84 0.83  Sodium 135 - 145 mmol/L 139 143 140  Potassium 3.5 - 5.1 mmol/L 4.2 3.7 4.0  Chloride 98 - 111 mmol/L 102 109 104  CO2 22 - 32 mmol/L _1 Calcium 8.9 - 10.3 mg/dL 9.7 9.4 9.6  Total Protein 6.5 - 8.1 g/dL - 6.9 6.5  Total Bilirubin 0.3 - 1.2 mg/dL - 0.6 0.5  Alkaline Phos 38 - 126 U/L - 60 -  AST 15 - 41 U/L - 18 17  ALT 0 - 44 U/L - 19 16    Lab Results  Component Value Date   WBC 9.7 05/10/2018   HGB 13.3 05/10/2018   HCT 41.2 05/10/2018   MCV 92.2 05/10/2018   PLT 239 05/10/2018   NEUTROABS 3.9 04/25/2018    ASSESSMENT & PLAN:  Malignant neoplasm of upper-inner  quadrant of right breast in female, estrogen receptor positive ([place]) 05/17/2018: Right lumpectomy: IDC, 5.5 mm, grade 1, margins negative, ER 100%, PR 100%, HER-2 1+ by IHC, Ki-67 less than 1%, T1BNX stage Ia  Pathology counseling: I discussed the final pathology report of the patient provided  a copy of this report. I discussed the margins as well as lymph node surgeries. We also discussed the final staging along with previously performed ER/PR and HER-2/neu testing.  Recommendation: Based upon favorable surgical report, I did not recommend adjuvant radiation. We will present her in the breast tumor board and make a final determination. I sent a prescription for letrozole. Patient has osteoporosis with a T score of -2.6 and 2017.  I ordered another bone density to be done. She is currently on Prolia with calcium and vitamin D. She will start antiestrogen therapy with letrozole 2.5 mg daily x5 years  Letrozole counseling:We discussed the risks and benefits of anti-estrogen therapy with  aromatase inhibitors. These include but not limited to insomnia, hot flashes, mood changes, vaginal dryness, bone density loss, and weight gain. We strongly believe that the benefits far outweigh the risks. Patient understands these risks and consented to starting treatment. Planned treatment duration is 5 years.  Return to clinic in 3 months for survivorship care plan visit   Orders Placed This Encounter  Procedures  . DG Bone Density    Standing Status:   Future    Standing Expiration Date:   07/02/2019    Order Specific Question:   Reason for exam:    Answer:   Post menopausal osteoporosis evaluation    Order Specific Question:   Preferred imaging location?    Answer:   [facility]   The patient has a good understanding of the overall plan. she agrees with it. she will call with any problems that may develop before the next visit here.   Harriette Ohara, MD 05/28/18

## 2018-05-28 NOTE — Assessment & Plan Note (Signed)
05/17/2018: Right lumpectomy: IDC, 5.5 mm, grade 1, margins negative, ER 100%, PR 100%, HER-2 1+ by IHC, Ki-67 less than 1%, T1BNX stage Ia  Pathology counseling: I discussed the final pathology report of the patient provided  a copy of this report. I discussed the margins as well as lymph node surgeries. We also discussed the final staging along with previously performed ER/PR and HER-2/neu testing.  Recommendation: Based upon favorable surgical report, we did not recommend adjuvant radiation. She will start antiestrogen therapy with letrozole 2.5 mg daily x5 years  Letrozole counseling:We discussed the risks and benefits of anti-estrogen therapy with aromatase inhibitors. These include but not limited to insomnia, hot flashes, mood changes, vaginal dryness, bone density loss, and weight gain. We strongly believe that the benefits far outweigh the risks. Patient understands these risks and consented to starting treatment. Planned treatment duration is 5 years.  Return to clinic in 3 months for survivorship care plan visit

## 2018-05-29 ENCOUNTER — Other Ambulatory Visit: Payer: Self-pay | Admitting: Internal Medicine

## 2018-05-29 ENCOUNTER — Encounter: Payer: Self-pay | Admitting: *Deleted

## 2018-05-29 DIAGNOSIS — R079 Chest pain, unspecified: Secondary | ICD-10-CM

## 2018-05-29 DIAGNOSIS — K219 Gastro-esophageal reflux disease without esophagitis: Secondary | ICD-10-CM

## 2018-05-29 DIAGNOSIS — R1013 Epigastric pain: Secondary | ICD-10-CM

## 2018-05-31 ENCOUNTER — Telehealth: Payer: Self-pay

## 2018-05-31 NOTE — Telephone Encounter (Signed)
Pt called to follow up on whether or not she will need to do radiation treatment. She has not heard any confirmation from our office yet and is calling today. Confirmed with Dr.Gudena and Dawn,RN (navigator), that pt will not need radiation, as long as she is on letrozole. Notified pt of this information and to start taking letrozole pill. Pt verbalized understanding and will call with any further concerns/questions.

## 2018-06-07 ENCOUNTER — Telehealth: Payer: Self-pay | Admitting: Hematology and Oncology

## 2018-06-07 ENCOUNTER — Ambulatory Visit
Admission: RE | Admit: 2018-06-07 | Discharge: 2018-06-07 | Disposition: A | Discharge status: Home / Self Care | Payer: Medicare Other | Source: Ambulatory Visit | Attending: Hematology and Oncology | Admitting: Hematology and Oncology

## 2018-06-07 ENCOUNTER — Encounter: Payer: Self-pay | Admitting: Genetic Counselor

## 2018-06-07 DIAGNOSIS — Z1379 Encounter for other screening for genetic and chromosomal anomalies: Secondary | ICD-10-CM | POA: Insufficient documentation

## 2018-06-07 DIAGNOSIS — M810 Age-related osteoporosis without current pathological fracture: Secondary | ICD-10-CM

## 2018-06-07 NOTE — Telephone Encounter (Signed)
Appointment scheduled letter/calendar mailed per 10/30 sch msg

## 2018-06-08 ENCOUNTER — Telehealth: Payer: Self-pay | Admitting: Genetic Counselor

## 2018-06-08 NOTE — Telephone Encounter (Signed)
LM on VM that results are back and to please CB. 

## 2018-06-11 ENCOUNTER — Ambulatory Visit: Payer: Self-pay | Admitting: Genetic Counselor

## 2018-06-11 DIAGNOSIS — Z1379 Encounter for other screening for genetic and chromosomal anomalies: Secondary | ICD-10-CM

## 2018-06-11 NOTE — Telephone Encounter (Signed)
Revealed negative genetic testing.  Discussed that we do not know why she has breast cancer or why there is cancer in the family. It could be due to a different gene that we are not testing, or maybe our current technology may not be able to pick something up.  It will be important for her to keep in contact with genetics to keep up with whether additional testing may be needed.  Revealed PMS2 VUS.  We will not alter medical management based on this VUS.

## 2018-06-11 NOTE — Progress Notes (Signed)
HPI:  Ms. Fullington was previously seen in the [facility] clinic due to a personal and family history of cancer and concerns regarding a hereditary predisposition to cancer. Please refer to our prior cancer genetics clinic note for more information regarding Ms. Poulsen's medical, social and family histories, and our assessment and recommendations, at the time. Ms. Grissom recent genetic test results were disclosed to her, as were recommendations warranted by these results. These results and recommendations are discussed in more detail below.  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     Malignant neoplasm of upper-inner quadrant of right breast in female, estrogen receptor positive ([place])   1992 Miscellaneous    Left breast cancer treated with mastectomy followed by adjuvant chemotherapy and 5 years of tamoxifen    04/17/2018 Initial Diagnosis    Screening detected right breast mass, by ultrasound measured 5 mm UIQ middle depth, biopsy revealed grade 1 IDC with DCIS, ER 100%, PR 100%, HER-2 -1+ by IHC, T1 a N0 stage I a clinical stage    05/17/2018 Surgery    Right lumpectomy: IDC, 5.5 mm, grade 1, margins negative, ER 100%, PR 100%, HER-2 1+ by IHC, Ki-67 less than 1%, T1BNX stage Ia    05/30/2018 Cancer Staging    Staging form: Breast, AJCC 8th Edition - Pathologic: Stage IA (pT1b, pN0, cM0, G1, ER+, PR+, HER2-) - Signed by Gardenia Phlegm, NP on 05/30/2018    06/05/2018 Genetic Testing    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  The Multi-Gene Panel offered by [facility] includes sequencing and/or deletion duplication testing of the following 84 genes: AIP, ALK, APC, ATM, AXIN2,BAP1,  BARD1, BLM, BMPR1A, BRCA1, BRCA2, BRIP1, CASR, CDC73, CDH1, CDK4, CDKN1B, CDKN1C, CDKN2A (p14ARF), CDKN2A (p16INK4a), CEBPA, CHEK2, CTNNA1, DICER1, DIS3L2, EGFR (c.2369C>T, p.Thr790Met variant only), EPCAM (Deletion/duplication testing  only), FH, FLCN, GATA2, GPC3, GREM1 (Promoter region deletion/duplication testing only), HOXB13 (c.251G>A, p.Gly84Glu), HRAS, KIT, MAX, MEN1, MET, MITF (c.952G>A, p.Glu318Lys variant only), MLH1, MSH2, MSH3, MSH6, MUTYH, NBN, NF1, NF2, NTHL1, PALB2, PDGFRA, PHOX2B, PMS2, POLD1, POLE, POT1, PRKAR1A, PTCH1, PTEN, RAD50, RAD51C, RAD51D, RB1, RECQL4, RET, RUNX1, SDHAF2, SDHA (sequence changes only), SDHB, SDHC, SDHD, SMAD4, SMARCA4, SMARCB1, SMARCE1, STK11, SUFU, TERC, TERT, TMEM127, TP53, TSC1, TSC2, VHL, WRN and WT1.  The report date is 06/05/2018.     FAMILY HISTORY:  We obtained a detailed, 4-generation family history.  Significant diagnoses are listed below: Family History  Problem Relation Age of Onset  . Heart attack Mother   . Colon cancer Mother 59  . Hypertension Mother   . Heart disease Father   . Breast cancer Sister        pat half sister dx in her 77s  . Pancreatic cancer Other 55    The patient has two sons and two daughters, who are all cancer free.  Her oldest daughter had a breast biopsy in the past that was benign.  She has one sister who is cancer free, but has a daughter was diagnosed with pancreatic cancer at age 61.  Both parents are deceased.  The patient's mother was diagnosed with colon cancer at 28 and died at 46.  She was one of 8 children who lived to adulthood.  None had cancer.  The maternal grandparents are deceased.  The patient's father died at 84 from a heart attack.  He had two sisters and four brothers who were caner free.  His parents are deceased.  Ms. Kaman is unaware of previous family history of genetic testing for hereditary cancer risks. Patient's maternal ancestors are of [place] and [place] descent, and paternal ancestors are of Caucasian [place] descent. There is no reported Ashkenazi Jewish ancestry. There is no known consanguinity.  GENETIC TEST RESULTS: Genetic testing reported out on June 05, 2018 through the multi-cancer panel found no  deleterious mutations.  The Multi-Gene Panel offered by [facility] includes sequencing and/or deletion duplication testing of the following 84 genes: AIP, ALK, APC, ATM, AXIN2,BAP1,  BARD1, BLM, BMPR1A, BRCA1, BRCA2, BRIP1, CASR, CDC73, CDH1, CDK4, CDKN1B, CDKN1C, CDKN2A (p14ARF), CDKN2A (p16INK4a), CEBPA, CHEK2, CTNNA1, DICER1, DIS3L2, EGFR (c.2369C>T, p.Thr790Met variant only), EPCAM (Deletion/duplication testing only), FH, FLCN, GATA2, GPC3, GREM1 (Promoter region deletion/duplication testing only), HOXB13 (c.251G>A, p.Gly84Glu), HRAS, KIT, MAX, MEN1, MET, MITF (c.952G>A, p.Glu318Lys variant only), MLH1, MSH2, MSH3, MSH6, MUTYH, NBN, NF1, NF2, NTHL1, PALB2, PDGFRA, PHOX2B, PMS2, POLD1, POLE, POT1, PRKAR1A, PTCH1, PTEN, RAD50, RAD51C, RAD51D, RB1, RECQL4, RET, RUNX1, SDHAF2, SDHA (sequence changes only), SDHB, SDHC, SDHD, SMAD4, SMARCA4, SMARCB1, SMARCE1, STK11, SUFU, TERC, TERT, TMEM127, TP53, TSC1, TSC2, VHL, WRN and WT1.   The test report has been scanned into EPIC and is located under the Molecular Pathology section of the Results Review tab.    We discussed with Ms. Lavey that since the current genetic testing is not perfect, it is possible there may be a gene mutation in one of these genes that current testing cannot detect, but that chance is small.  We also discussed, that it is possible that another gene that has not yet been discovered, or that we have not yet tested, is responsible for the cancer diagnoses in the family, and it is, therefore, important to remain in touch with cancer genetics in the future so that we can continue to offer Ms. Encina the most up to date genetic testing.   Genetic testing did detect a Variant of Unknown Significance in the PMS2 gene called c.2559C>G. At this time, it is unknown if this variant is associated with increased cancer risk or if this is a normal finding, but most variants such as this get reclassified to being inconsequential. It should not be used to make  medical management decisions. With time, we suspect the lab will determine the significance of this variant, if any. If we do learn more about it, we will try to contact Ms. Tallo to discuss it further. However, it is important to stay in touch with [place] periodically and keep the address and phone number up to date.   CANCER SCREENING RECOMMENDATIONS: This result is reassuring and indicates that Ms. Wilz likely does not have an increased risk for a future cancer due to a mutation in one of these genes. This normal test also suggests that Ms. Dalto's cancer was most likely not due to an inherited predisposition associated with one of these genes.  Most cancers happen by chance and this negative test suggests that her cancer falls into this category.  We, therefore, recommended she continue to follow the cancer management and screening guidelines provided by her oncology and primary healthcare provider.   An individual's cancer risk and medical management are not determined by genetic test results alone. Overall cancer risk assessment incorporates additional factors, including personal medical history, family history, and any available genetic information that may result in a personalized plan for cancer prevention and surveillance.  RECOMMENDATIONS FOR FAMILY MEMBERS:  Women in this family might be at some increased risk of developing cancer, over  the general population risk, simply due to the family history of cancer.  We recommended women in this family have a yearly mammogram beginning at age 87, or 56 years younger than the earliest onset of cancer, an annual clinical breast exam, and perform monthly breast self-exams. Women in this family should also have a gynecological exam as recommended by their primary provider. All family members should have a colonoscopy by age 25.  FOLLOW-UP: Lastly, we discussed with Ms. Hickey that cancer genetics is a rapidly advancing field and it is possible that new  genetic tests will be appropriate for her and/or her family members in the future. We encouraged her to remain in contact with cancer genetics on an annual basis so we can update her personal and family histories and let her know of advances in cancer genetics that may benefit this family.   Our contact number was provided. Ms. Scobey questions were answered to her satisfaction, and she knows she is welcome to call us at anytime with additional questions or concerns.   Roma Kayser, MS, [facility] Certified Genetic Counselor [email or web address]

## 2018-06-25 ENCOUNTER — Ambulatory Visit: Payer: Medicare Other

## 2018-06-25 DIAGNOSIS — M810 Age-related osteoporosis without current pathological fracture: Secondary | ICD-10-CM

## 2018-06-25 MED ORDER — DENOSUMAB 60 MG/ML ~~LOC~~ SOSY
60.0000 mg | PREFILLED_SYRINGE | Freq: Once | SUBCUTANEOUS | Status: AC
  Administered 2018-06-25: 60 mg via SUBCUTANEOUS

## 2018-06-25 NOTE — Progress Notes (Signed)
Patient was in office for prolia injection.Patient received the injection in her right arm subcutaneous.Patient tolerated well

## 2018-08-29 ENCOUNTER — Telehealth: Payer: Self-pay

## 2018-08-29 NOTE — Telephone Encounter (Signed)
Spoke with patient reminding of SCP visit with NP on 09/03/18 at 10 am.  Patient says she will come to appt.

## 2018-09-03 ENCOUNTER — Encounter: Payer: Self-pay | Admitting: Adult Health

## 2018-09-03 ENCOUNTER — Inpatient Hospital Stay: Payer: Medicare Other | Attending: Hematology and Oncology | Admitting: Adult Health

## 2018-09-03 ENCOUNTER — Ambulatory Visit: Payer: Medicare Other | Admitting: Hematology and Oncology

## 2018-09-03 ENCOUNTER — Telehealth: Payer: Self-pay | Admitting: Hematology and Oncology

## 2018-09-03 VITALS — BP 128/82 | HR 78 | Temp 98.2°F | Resp 18 | Ht 65.0 in | Wt 174.2 lb

## 2018-09-03 DIAGNOSIS — Z87891 Personal history of nicotine dependence: Secondary | ICD-10-CM | POA: Diagnosis not present

## 2018-09-03 DIAGNOSIS — C50211 Malignant neoplasm of upper-inner quadrant of right female breast: Secondary | ICD-10-CM

## 2018-09-03 DIAGNOSIS — Z170 Estrogen receptor positive status [ER+]: Secondary | ICD-10-CM

## 2018-09-03 DIAGNOSIS — Z79811 Long term (current) use of aromatase inhibitors: Secondary | ICD-10-CM

## 2018-09-03 DIAGNOSIS — Z79899 Other long term (current) drug therapy: Secondary | ICD-10-CM | POA: Diagnosis not present

## 2018-09-03 NOTE — Progress Notes (Signed)
CLINIC:  Survivorship   REASON FOR VISIT:  Routine follow-up post-treatment for a recent history of breast cancer.  BRIEF ONCOLOGIC HISTORY:  Oncology History   PMS2 VUS on Multi-cancer panel.     Malignant neoplasm of upper-inner quadrant of right breast in female, estrogen receptor positive ([place])   1992 Miscellaneous    Left breast cancer treated with mastectomy followed by adjuvant chemotherapy and 5 years of tamoxifen    04/17/2018 Initial Diagnosis    Screening detected right breast mass, by ultrasound measured 5 mm UIQ middle depth, biopsy revealed grade 1 IDC with DCIS, ER 100%, PR 100%, HER-2 -1+ by IHC, T1 a N0 stage I a clinical stage    05/17/2018 Surgery    Right lumpectomy: IDC, 5.5 mm, grade 1, margins negative, ER 100%, PR 100%, HER-2 1+ by IHC, Ki-67 less than 1%, T1BNX stage Ia    05/30/2018 Cancer Staging    Staging form: Breast, AJCC 8th Edition - Pathologic: Stage IA (pT1b, pN0, cM0, G1, ER+, PR+, HER2-) - Signed by Gardenia Phlegm, NP on 05/30/2018    06/05/2018 Genetic Testing    PMS2 c.2559C>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  The Multi-Gene Panel offered by [facility] includes sequencing and/or deletion duplication testing of the following 84 genes: AIP, ALK, APC, ATM, AXIN2,BAP1,  BARD1, BLM, BMPR1A, BRCA1, BRCA2, BRIP1, CASR, CDC73, CDH1, CDK4, CDKN1B, CDKN1C, CDKN2A (p14ARF), CDKN2A (p16INK4a), CEBPA, CHEK2, CTNNA1, DICER1, DIS3L2, EGFR (c.2369C>T, p.Thr790Met variant only), EPCAM (Deletion/duplication testing only), FH, FLCN, GATA2, GPC3, GREM1 (Promoter region deletion/duplication testing only), HOXB13 (c.251G>A, p.Gly84Glu), HRAS, KIT, MAX, MEN1, MET, MITF (c.952G>A, p.Glu318Lys variant only), MLH1, MSH2, MSH3, MSH6, MUTYH, NBN, NF1, NF2, NTHL1, PALB2, PDGFRA, PHOX2B, PMS2, POLD1, POLE, POT1, PRKAR1A, PTCH1, PTEN, RAD50, RAD51C, RAD51D, RB1, RECQL4, RET, RUNX1, SDHAF2, SDHA (sequence changes only), SDHB, SDHC, SDHD, SMAD4,  SMARCA4, SMARCB1, SMARCE1, STK11, SUFU, TERC, TERT, TMEM127, TP53, TSC1, TSC2, VHL, WRN and WT1.  The report date is 06/05/2018.    06/2018 -  Anti-estrogen oral therapy    Letrozole daily     INTERVAL HISTORY:  Rebecca Guzman presents to the [facility] today for our initial meeting to review her survivorship care plan detailing her treatment course for breast cancer, as well as monitoring long-term side effects of that treatment, education regarding health maintenance, screening, and overall wellness and health promotion.     Overall, Rebecca Guzman reports feeling moderately well.  She is feeling increasingly tired.  She has scoliosis, a slipped disc, and a pinched nerve.  She doesn't note an increase in her back pain.  She has new left knee pain and is concerned that the letrozole may be contributing it to it.      REVIEW OF SYSTEMS:  Review of Systems  Constitutional: Positive for fatigue. Negative for appetite change, chills, fever and unexpected weight change.  HENT:   Negative for hearing loss, lump/mass, sore throat and trouble swallowing.   Eyes: Negative for eye problems and icterus.  Respiratory: Negative for chest tightness, cough and shortness of breath.   Cardiovascular: Negative for chest pain, leg swelling and palpitations.  Gastrointestinal: Negative for abdominal distention, abdominal pain, constipation, diarrhea, nausea and vomiting.  Endocrine: Negative for hot flashes.  Genitourinary: Negative for difficulty urinating.   Musculoskeletal: Positive for arthralgias.  Skin: Negative for itching and rash.  Neurological: Negative for dizziness, extremity weakness, headaches and numbness.  Hematological: Negative for adenopathy. Does not bruise/bleed easily.  Psychiatric/Behavioral: Negative for depression. The patient is not  nervous/anxious.    Breast: Denies any new nodularity, masses, tenderness, nipple changes, or nipple discharge.    ONCOLOGY TREATMENT TEAM:  1.  Surgeon:  Dr. Barry Dienes at [facility] 2. Medical Oncologist: Dr. Lindi Adie  3. Radiation Oncologist: Dr. Sondra Come    PAST MEDICAL/SURGICAL HISTORY:  Past Medical History:  Diagnosis Date  . Asthma   . BPPV (benign paroxysmal positional vertigo)   . Compression fracture of L3 lumbar vertebra 04/06/2015  . Family history of breast cancer   . Family history of colon cancer   . Family history of pancreatic cancer   . GERD (gastroesophageal reflux disease)   . Hiatal hernia    pt states she doesnt have this  . History of adenomatous polyp of colon    tubular adenoma 2005 and  2014 tubular adenoma and  hyperplastic polyp  . History of breast cancer per pt no recurrence   dx 1992 --  s/p  left breast mastectomy and chemotherapy  . Hyperlipidemia LDL goal <100   . Osteoarthritis of right knee   . Personal history of chemotherapy 1992  . PONV (postoperative nausea and vomiting)    and "gets weak and light-leaded"  . Sensation of pressure in bladder area   . Vaginal vault prolapse    anterior  . Wears glasses   . Wears hearing aid    left only   Past Surgical History:  Procedure Laterality Date  . BREAST LUMPECTOMY WITH RADIOACTIVE SEED LOCALIZATION Right 05/17/2018   Procedure: RIGHT BREAST LUMPECTOMY WITH RADIOACTIVE SEED LOCALIZATION;  Surgeon: [name], MD;  Location: [facility];  Service: General;  Laterality: Right;  . CATARACT EXTRACTION W/ INTRAOCULAR LENS  IMPLANT, BILATERAL  2014  . CHOLECYSTECTOMY  1985  . COLONOSCOPY  last one 06-04-2013  . CYSTOCELE REPAIR N/A 02/29/2016   Procedure: ANTERIOR VAULT PROLAPSE REPAIR COLOPLAST [place] FIXATION AUGMENTED AXIS DERMIS REPAIR  ;  Surgeon: Carolan Clines, MD;  Location: [facility];  Service: Urology;  Laterality: N/A;  . ESOPHAGOGASTRODUODENOSCOPY  08-31-2009  . LAPAROSCOPIC ASSISTED VAGINAL HYSTERECTOMY  01-10-2000   w/ Bilateral Salpingoophorectomy /  Anterior & Posterior repair/  Pubovaginal Sling   . MASTECTOMY Left 1992     ALLERGIES:  Allergies  Allergen Reactions  . Codeine Nausea And Vomiting  . Morphine And Related Other (See Comments)    Nausea, and drop in blood pressure     CURRENT MEDICATIONS:  Outpatient Encounter Medications as of 09/03/2018  Medication Sig  . acetaminophen (TYLENOL) 500 MG tablet Take 1,000 mg by mouth every morning.   . Ca Carbonate-Mag Hydroxide (ROLAIDS) 550-110 MG CHEW Chew 2 tablets by mouth daily as needed (for indigestion).   . Calcium Citrate-Vitamin D (CITRACAL + D PO) Take 2 tablets by mouth daily.  Marland Kitchen letrozole (FEMARA) 2.5 MG tablet Take 1 tablet (2.5 mg total) by mouth daily.  Marland Kitchen loratadine (CLARITIN) 10 MG tablet Take 10 mg by mouth daily.  . metoprolol tartrate (LOPRESSOR) 25 MG tablet Take 1 tablet (25 mg total) by mouth 2 (two) times daily. (Patient taking differently: Take 12.5 mg by mouth 2 (two) times daily. )  . Multiple Vitamin (MULTIVITAMIN) tablet Take 1 tablet by mouth daily.  . Omega-3 Fatty Acids (FISH OIL) 1000 MG CAPS Take 1 capsule (1,000 mg total) by mouth daily.  Marland Kitchen omeprazole (PRILOSEC) 40 MG capsule TAKE 1 CAPSULE BY MOUTH TWICE A DAY  . pravastatin (PRAVACHOL) 40 MG tablet TAKE 1 TABLET BY MOUTH ONCE DAILY (Patient taking  differently: Take 40 mg by mouth daily. )  . Probiotic Product (PROBIOTIC FORMULA PO) Take 1 tablet by mouth every evening.   . [DISCONTINUED] traMADol (ULTRAM) 50 MG tablet Take 1 tablet (50 mg total) by mouth every 8 (eight) hours as needed for moderate pain or severe pain. (Patient not taking: Reported on 09/03/2018)   No facility-administered encounter medications on file as of 09/03/2018.      ONCOLOGIC FAMILY HISTORY:  Family History  Problem Relation Age of Onset  . Heart attack Mother   . Colon cancer Mother 75  . Hypertension Mother   . Heart disease Father   . Breast cancer Sister        pat half sister dx in her 35s  . Pancreatic cancer Other 17     GENETIC  COUNSELING/TESTING: negative  SOCIAL HISTORY:  Social History   Socioeconomic History  . Marital status: Widowed    Spouse name: Not on file  . Number of children: 4  . Years of education: Not on file  . Highest education level: Not on file  Occupational History  . Occupation: Retired Product manager: RETIRED  Social Needs  . Financial resource strain: Not hard at all  . Food insecurity:    Worry: Never true    Inability: Never true  . Transportation needs:    Medical: No    Non-medical: No  Tobacco Use  . Smoking status: Former Smoker    Years: 13.00    Types: Cigarettes    Last attempt to quit: 10/12/1983    Years since quitting: 34.9  . Smokeless tobacco: Never Used  Substance and Sexual Activity  . Alcohol use: No  . Drug use: No  . Sexual activity: Not on file  Lifestyle  . Physical activity:    Days per week: 6 days    Minutes per session: 30 min  . Stress: Only a little  Relationships  . Social connections:    Talks on phone: More than three times a week    Gets together: More than three times a week    Attends religious service: More than 4 times per year    Active member of club or organization: No    Attends meetings of clubs or organizations: Never    Relationship status: Widowed  . Intimate partner violence:    Fear of current or ex partner: No    Emotionally abused: No    Physically abused: No    Forced sexual activity: No  Other Topics Concern  . Not on file  Social History Narrative  . Not on file       PHYSICAL EXAMINATION:  Vital Signs:   Vitals:   09/03/18 0954  BP: 128/82  Pulse: 78  Resp: 18  Temp: 98.2 F (36.8 C)  SpO2: 98%   Filed Weights   09/03/18 0954  Weight: 174 lb 3.2 oz (79 kg)   General: Well-nourished, well-appearing female in no acute distress.  She is unaccompanied today.   HEENT: Head is normocephalic.  Pupils equal and reactive to light. Conjunctivae clear without exudate.  Sclerae anicteric. Oral  mucosa is pink, moist.  Oropharynx is pink without lesions or erythema.  Lymph: No cervical, supraclavicular, or infraclavicular lymphadenopathy noted on palpation.  Cardiovascular: Regular rate and rhythm.Marland Kitchen Respiratory: Clear to auscultation bilaterally. Chest expansion symmetric; breathing non-labored.  Breasts: right breast s/p lumpectomy.  No sign of local recurrence, left breast s/p mastectomy, no sign of local recurrence GI:  Abdomen soft and round; non-tender, non-distended. Bowel sounds normoactive.  GU: Deferred.  Neuro: No focal deficits. Steady gait.  Psych: Mood and affect normal and appropriate for situation.  Extremities: No edema. MSK: No focal spinal tenderness to palpation.  Full range of motion in bilateral upper extremities Skin: Warm and dry.  LABORATORY DATA:  None for this visit.  DIAGNOSTIC IMAGING:  None for this visit.      ASSESSMENT AND PLAN:  Rebecca Guzman is a pleasant 85 y.o. female with Stage IA right breast invasive ductal carcinoma, ER+/PR+/HER2-, diagnosed in 04/2018, treated with lumpectomy, and anti-estrogen therapy with Letrozole beginning in 06/2018.  She presents to the [facility]orship [facility] for our initial meeting and routine follow-up post-completion of treatment for breast cancer.    1. Stage IA right breast cancer:  Rebecca Guzman is continuing to recover from definitive treatment for breast cancer. She will follow-up with her medical oncologist, Dr. Lindi Adie in 6 months.  She will continue her anti-estrogen therapy with Letrozole (see #2).  Today, a comprehensive survivorship care plan and treatment summary was reviewed with the patient today detailing her breast cancer diagnosis, treatment course, potential late/long-term effects of treatment, appropriate follow-up care with recommendations for the future, and patient education resources.  A copy of this summary, along with a letter will be sent to the patient's primary care provider via mail/fax/In Basket  message after today's visit.    2.  Fatigue and knee pain: After discussion with Rebecca Guzman, that fatigue can happen with Letrozole, but isn't as common.  I let her know to give it a month or two longer to see if it improves and we talked about increasing her activity level.  She thinks it may be related some to her extreme dislike of winter, and that perhaps she will feel better in a few months when the weather turns.  The knee pain, could be made worse with the Letrozole, however I explained that a lot of the times the achiness is in more than one joint.  I let her know that patients who were able to adhere and continue anti estrogen therapy for the entire 5 year adjuvant time period had better outcomes that women who did not.  She agrees.  She will f/u with her PCP and orthopedist about her knee.  I let her know that if things persist or worsen, and she wants to take a 2 week holiday from the Letrozole to see if her symptoms improve, that it is an option, just to let us know.  She notes that she is not at the point to do this, but will let us know if she gets there.  She notes that she previously tolerated adjuvant Tamoxifen with no difficulty with her breast cancer diagnosis in 1992.  3. Bone health:  Given Rebecca Guzman's age/history of breast cancer and her current treatment regimen including anti-estrogen therapy with Letrozole, she is at risk for bone demineralization.  Her last bone density was on 06/07/2018 and was consistent with osteopenia with a T score of -2.1 in the right forearm.  She receives Prolia every 6 months with her PCP, Dr. Joneen Caraway.  She was given education on specific activities to promote bone health.  4. Cancer screening:  Due to Rebecca Guzman's history and her age, she should receive screening for skin cancers, colon cancer, and gynecologic cancers.  The information and recommendations are listed on the patient's comprehensive care plan/treatment summary and were reviewed in detail with the  patient.  5. Health maintenance and wellness promotion: Rebecca Guzman was encouraged to consume 5-7 servings of fruits and vegetables per day. We reviewed the "Nutrition Rainbow" handout, as well as the handout "Take Control of Your Health and Reduce Your Cancer Risk" from the [place].  She was also encouraged to engage in moderate to vigorous exercise for 30 minutes per day most days of the week. We discussed the [employer] fitness program, which is designed for cancer survivors to help them become more physically fit after cancer treatments.  She was instructed to limit her alcohol consumption and continue to abstain from tobacco use.     6. Support services/counseling: It is not uncommon for this period of the patient's cancer care trajectory to be one of many emotions and stressors.  We discussed an opportunity for her to participate in the next session of [facility] ("Finding Your New Normal") support group series designed for patients after they have completed treatment.   Rebecca Guzman was encouraged to take advantage of our many other support services programs, support groups, and/or counseling in coping with her new life as a cancer survivor after completing anti-cancer treatment.  She was offered support today through active listening and expressive supportive counseling.  She was given information regarding our available services and encouraged to contact me with any questions or for help enrolling in any of our support group/programs.    Dispo:   -Return to cancer center in 6 months for f/u with Dr. Lindi Adie -Mammogram due in 03/2019 -Follow up with surgery per Dr. Barry Dienes -She is welcome to return back to the Survivorship Clinic at any time; no additional follow-up needed at this time.  -Consider referral back to survivorship as a long-term survivor for continued surveillance  A total of (30) minutes of face-to-face time was spent with this patient with greater than 50% of that time  in counseling and care-coordination.   Gardenia Phlegm, NP Survivorship Program [facility] [phone]   Note: PRIMARY CARE PROVIDER [name], [place] [phone]

## 2018-09-03 NOTE — Telephone Encounter (Signed)
Gave avs and calendar ° °

## 2018-09-06 ENCOUNTER — Ambulatory Visit: Payer: Medicare Other | Admitting: Hematology and Oncology

## 2018-10-08 ENCOUNTER — Encounter: Payer: Self-pay | Admitting: Cardiology

## 2018-10-08 ENCOUNTER — Ambulatory Visit: Payer: Medicare Other | Admitting: Cardiology

## 2018-10-08 VITALS — BP 122/66 | HR 80 | Ht 65.0 in | Wt 176.2 lb

## 2018-10-08 DIAGNOSIS — R002 Palpitations: Secondary | ICD-10-CM

## 2018-10-08 DIAGNOSIS — E785 Hyperlipidemia, unspecified: Secondary | ICD-10-CM

## 2018-10-08 DIAGNOSIS — M791 Myalgia, unspecified site: Secondary | ICD-10-CM | POA: Diagnosis not present

## 2018-10-08 DIAGNOSIS — T466X5A Adverse effect of antihyperlipidemic and antiarteriosclerotic drugs, initial encounter: Secondary | ICD-10-CM | POA: Diagnosis not present

## 2018-10-08 NOTE — Progress Notes (Signed)
Cardiology Office Note    Date:  10/08/2018   ID:  Rebecca Guzman, DOB 1933/09/18, MRN [id number]  PCP:  [name], DO  Cardiologist:  Ena Dawley, MD   Referring physician: [name], DO   History of Present Illness:  Rebecca Guzman is a 85 y.o. female who is a very pleasant patient coming with concern of palpitations. She states that she has been experiencing palpitations with increased frequency currently every day especially at rest and at night. They would last a few seconds and are not associated with shortness of breath chest pain dizziness or syncope other very uncomfortable and she is concerned. They usually happen on days when she exerts herself. The patient exercises frequently as part of [employer] program and doesn't experience any chest pain or shortness of breath. She has a very remote history of smoking when she was young. Her family history includes early coronary artery disease in her father died at age of 20 secondary to heart attack. The patient denies any lower extremity edema, no claudication no orthopnea or proximal nocturnal dyspnea.  12/29/2016 - patient underwent Holter monitoring that showed frequent PACs (700 in 34 hrs) and few very short runs of atrial tachycardia with the longest lasting 4 beats. She was started on metoprolol 12.5 mg by mouth twice a day with significant improvement of symptoms. She also had an echocardiogram performed that showed LVEF 60-65% and grade 1 diastolic dysfunction, trivial MR, normal left atrial size and mild TR.  08/18/2017 - 6 months follow up, stable palpitations that'll cure approximately once a week and last few seconds, they're not associated with dizziness chest pain or falls. She remains very active, she walks daily in her neighborhood and does water aerobic and [employer] with no chest pain shortness of breath. She also denies claudications no lower extremity edema orthopnea or proximal nocturnal dyspnea. She tolerates  pravastatin well.  March 28, 2018, the patient continues to feel well, she continues to go to [facility] to exercise, mostly water walking, denies any chest pain shortness of breath, she has occasional palpitations possibly more frequent than previously.  No side effects with her medications including pravastatin.  She has no dizziness or falls.  10/08/2018 -this is 6 months follow-up, since the last visit the patient was diagnosed with recurrent breast cancer, this type was different than in 9092 when she was originally diagnosed, she was a stage I and underwent a lumpectomy and did not require any chemo.  She has been experiencing a lot of generalized muscle and joint pain and she attributes it to pravastatin and omeprazole.  She has occasional palpitations but only last few seconds.  No chest pain or shortness of breath.   Past Medical History:  Diagnosis Date  . Asthma   . BPPV (benign paroxysmal positional vertigo)   . Compression fracture of L3 lumbar vertebra 04/06/2015  . Family history of breast cancer   . Family history of colon cancer   . Family history of pancreatic cancer   . GERD (gastroesophageal reflux disease)   . Hiatal hernia    pt states she doesnt have this  . History of adenomatous polyp of colon    tubular adenoma 2005 and  2014 tubular adenoma and  hyperplastic polyp  . History of breast cancer per pt no recurrence   dx 1992 --  s/p  left breast mastectomy and chemotherapy  . Hyperlipidemia LDL goal <100   . Osteoarthritis of right knee   .  Personal history of chemotherapy 1992  . PONV (postoperative nausea and vomiting)    and "gets weak and light-leaded"  . Sensation of pressure in bladder area   . Vaginal vault prolapse    anterior  . Wears glasses   . Wears hearing aid    left only    Past Surgical History:  Procedure Laterality Date  . BREAST LUMPECTOMY WITH RADIOACTIVE SEED LOCALIZATION Right 05/17/2018   Procedure: RIGHT BREAST LUMPECTOMY WITH RADIOACTIVE  SEED LOCALIZATION;  Surgeon: [name], MD;  Location: [place];  Service: General;  Laterality: Right;  . CATARACT EXTRACTION W/ INTRAOCULAR LENS  IMPLANT, BILATERAL  2014  . CHOLECYSTECTOMY  1985  . COLONOSCOPY  last one 06-04-2013  . CYSTOCELE REPAIR N/A 02/29/2016   Procedure: ANTERIOR VAULT PROLAPSE REPAIR COLOPLAST [place] FIXATION AUGMENTED AXIS DERMIS REPAIR  ;  Surgeon: Carolan Clines, MD;  Location: [place];  Service: Urology;  Laterality: N/A;  . ESOPHAGOGASTRODUODENOSCOPY  08-31-2009  . LAPAROSCOPIC ASSISTED VAGINAL HYSTERECTOMY  01-10-2000   w/ Bilateral Salpingoophorectomy /  Anterior & Posterior repair/  Pubovaginal Sling  . MASTECTOMY Left 1992    Current Medications: Outpatient Medications Prior to Visit  Medication Sig Dispense Refill  . acetaminophen (TYLENOL) 500 MG tablet Take 1,000 mg by mouth every morning.     . Ca Carbonate-Mag Hydroxide (ROLAIDS) 550-110 MG CHEW Chew 2 tablets by mouth daily as needed (for indigestion).     . Calcium Citrate-Vitamin D (CITRACAL + D PO) Take 2 tablets by mouth daily.    Marland Kitchen letrozole (FEMARA) 2.5 MG tablet Take 1 tablet (2.5 mg total) by mouth daily. 90 tablet 3  . loratadine (CLARITIN) 10 MG tablet Take 10 mg by mouth daily.    . metoprolol tartrate (LOPRESSOR) 25 MG tablet Take 1 tablet (25 mg total) by mouth 2 (two) times daily. 180 tablet 3  . Multiple Vitamin (MULTIVITAMIN) tablet Take 1 tablet by mouth daily.    . Omega-3 Fatty Acids (FISH OIL) 1000 MG CAPS Take 1 capsule (1,000 mg total) by mouth daily.  0  . omeprazole (PRILOSEC) 40 MG capsule TAKE 1 CAPSULE BY MOUTH TWICE A DAY 180 capsule 1  . pravastatin (PRAVACHOL) 40 MG tablet TAKE 1 TABLET BY MOUTH ONCE DAILY 90 tablet 1  . Probiotic Product (PROBIOTIC FORMULA PO) Take 1 tablet by mouth every evening.      No facility-administered medications prior to visit.      Allergies:   Codeine and Morphine and related   Social History    Socioeconomic History  . Marital status: Widowed    Spouse name: Not on file  . Number of children: 4  . Years of education: Not on file  . Highest education level: Not on file  Occupational History  . Occupation: Retired Product manager: RETIRED  Social Needs  . Financial resource strain: Not hard at all  . Food insecurity:    Worry: Never true    Inability: Never true  . Transportation needs:    Medical: No    Non-medical: No  Tobacco Use  . Smoking status: Former Smoker    Years: 13.00    Types: Cigarettes    Last attempt to quit: 10/12/1983    Years since quitting: 35.0  . Smokeless tobacco: Never Used  Substance and Sexual Activity  . Alcohol use: No  . Drug use: No  . Sexual activity: Not on file  Lifestyle  . Physical activity:  Days per week: 6 days    Minutes per session: 30 min  . Stress: Only a little  Relationships  . Social connections:    Talks on phone: More than three times a week    Gets together: More than three times a week    Attends religious service: More than 4 times per year    Active member of club or organization: No    Attends meetings of clubs or organizations: Never    Relationship status: Widowed  Other Topics Concern  . Not on file  Social History Narrative  . Not on file     Family History:  The patient's family history includes Breast cancer in her sister; Colon cancer (age of onset: 29) in her mother; Heart attack in her mother; Heart disease in her father; Hypertension in her mother; Pancreatic cancer (age of onset: 59) in an other family member.   ROS:   Please see the history of present illness.    ROS All other systems reviewed and are negative.  PHYSICAL EXAM:   VS:  BP 122/66   Pulse 80   Ht 5\' 5"  (1.651 m)   Wt 176 lb 3.2 oz (79.9 kg)   LMP 11/21/1985   SpO2 98%   BMI 29.32 kg/m    GEN: Well nourished, well developed, in no acute distress  HEENT: normal  Neck: no JVD, carotid bruits, or masses Cardiac:  RRR; no murmurs, rubs, or gallops,no edema, Poor pulses in the left lower extremity.  Respiratory:  clear to auscultation bilaterally, normal work of breathing GI: soft, nontender, nondistended, + BS MS: no deformity or atrophy  Skin: warm and dry, no rash Neuro:  Alert and Oriented x 3, Strength and sensation are intact Psych: euthymic mood, full affect  Wt Readings from Last 3 Encounters:  10/08/18 176 lb 3.2 oz (79.9 kg)  09/03/18 174 lb 3.2 oz (79 kg)  05/28/18 174 lb 1.6 oz (79 kg)    Studies/Labs Reviewed:   EKG:  EKG is ordered today.  The ekg ordered today demonstrates Normal sinus rhythm, normal EKG. Personally reviewed.  Recent Labs: 04/25/2018: ALT 19 05/10/2018: BUN 15; Creatinine, Ser 0.90; Hemoglobin 13.3; Platelets 239; Potassium 4.2; Sodium 139   Lipid Panel    Component Value Date/Time   CHOL 170 04/30/2018 0929   CHOL 187 01/26/2016 0802   TRIG 121 04/30/2018 0929   HDL 63 04/30/2018 0929   HDL 59 01/26/2016 0802   CHOLHDL 2.7 04/30/2018 0929   VLDL 33 (H) 02/13/2017 0832   LDLCALC 85 04/30/2018 0929    Additional studies/ records that were reviewed today include:   TTE: 10/2016 - Left ventricle: The cavity size was normal. Wall thickness was   increased in a pattern of mild LVH. Systolic function was normal.   The estimated ejection fraction was in the range of 60% to 65%.   Wall motion was normal; there were no regional wall motion   abnormalities. Doppler parameters are consistent with abnormal   left ventricular relaxation (grade 1 diastolic dysfunction). The   E/e&' ratio is >15, suggesting elevated LV filling pressure. - Mitral valve: Mildly thickened and sclerotic anterior leaflet.   There was trivial regurgitation. - Left atrium: The atrium was normal in size. - Tricuspid valve: There was mild regurgitation. - Pulmonary arteries: PA peak pressure: 37 mm Hg (S). - Inferior vena cava: The vessel was normal in size. The   respirophasic diameter  changes were in the normal range (>=  50%),   consistent with normal central venous pressure.  Impressions:  - LVEF 60-65%, mild LVH, normal wall motion, grade 1 DD with   elevated LV filling pressure, trivial MR, normal LA size, mild   TR, RVSP 37 mmHg, normal IVC.  EKG performed today November 26, 2017 shows normal rhythm normal EKG, unchanged from prior. This was personally reviewed.    ASSESSMENT:    1. Palpitations   2. Hyperlipidemia LDL goal <100   3. Myalgia due to statin    PLAN:  In order of problems listed above:  1. Palpitations, Normal echocardiogram, normal left atrial size, normal labs including electrolytes and TSH. Holter monitor showed 700 PACs in 36 hours and few very short runs of atrial tachycardia, significant symptoms improvement with metoprolol, this was recently increased to 25 mg twice daily.   2. Blood pressure is well controlled 3. Hyperlipidemia followed by her primary care physician, she is on pravastatin, now experiencing muscle pain, we will hold any for symptoms improve we will discontinue, and start Zetia 4. GERD -hold omeprazole and start ranitidine 150 mg p.o. twice daily  Follow-up in 6 months.  Medication Adjustments/Labs and Tests Ordered: Current medicines are reviewed at length with the patient today.  Concerns regarding medicines are outlined above.  Medication changes, Labs and Tests ordered today are listed in the Patient Instructions below. There are no Patient Instructions on file for this visit.   Signed, Ena Dawley, MD  10/08/2018 10:05 AM    [address], [place], [place]  [address] Phone: [phone]; Fax: [phone]

## 2018-10-08 NOTE — Patient Instructions (Signed)
Medication Instructions:   HOLD YOUR PRAVASTATIN FOR 2 WEEKS THEN CALL THE OFFICE AT [phone] TO REPORT TO THE OPERATOR OR MYSELF/TRIAGE NURSE IF YOUR SYMPTOMS IMPROVED SINCE BEING OFF THIS MEDICATION.    If you need a refill on your cardiac medications before your next appointment, please call your pharmacy.       Follow-Up: At [facility], you and your health needs are our priority.  As part of our continuing mission to provide you with exceptional heart care, we have created designated Provider Care Teams.  These Care Teams include your primary Cardiologist (physician) and Advanced Practice Providers (APPs -  Physician Assistants and Nurse Practitioners) who all work together to provide you with the care you need, when you need it. You will need a follow up appointment in 6 months.  Please call our office 2 months in advance to schedule this appointment.  You may see Ena Dawley, MD or one of the following Advanced Practice Providers on your designated Care Team:   [place], PA-C Melina Copa, PA-C . Ermalinda Barrios, PA-C

## 2018-10-12 ENCOUNTER — Telehealth: Payer: Self-pay | Admitting: *Deleted

## 2018-10-12 NOTE — Telephone Encounter (Signed)
Spoke with patient and she doesn't have any special concerns, she will discuss labs with you at her visit on 10/29/2018

## 2018-10-12 NOTE — Telephone Encounter (Signed)
-----   Message from [name], DO sent at 10/11/2018  8:30 AM EST ----- Regarding: RE: Inquiry Apparently I did not intend for labs.  I'd imagine she is concerned since she is requesting this.  Let's have her come in for cmp, flp and hba1c for hyperlipidemia, hyperglycemia.   ----- Message ----- From: Graylon Good Sent: 10/10/2018  11:48 AM EST To: [name], DO, Despina Hidden, CMA Subject: Inquiry                                        Hi,  Patient called, she have a 6 months follow up on 10/29/18.  She said she should have a lab appt before her 6 months visit.  I told her that I did not see anything in her AVS last 04/30/18 and Anita-CMA says there is no order from Dr. Mariea Clonts from 04/30/18.  I told patient that I will inquire with Dr. Mariea Clonts if she needs a lab appointment prior her 6 months visit.  Let me know  Thanks Amy

## 2018-10-24 ENCOUNTER — Telehealth: Payer: Self-pay | Admitting: Cardiology

## 2018-10-24 MED ORDER — ROSUVASTATIN CALCIUM 5 MG PO TABS: 5.0000 mg | ORAL_TABLET | ORAL | 2 refills | Status: DC

## 2018-10-24 NOTE — Telephone Encounter (Signed)
° °  Pt c/o medication issue:  1. Name of Medication: pravastatin (PRAVACHOL) 40 MG tablet  2. How are you currently taking this medication (dosage and times per day)? As written  3. Are you having a reaction (difficulty breathing--STAT)? no  4. What is your medication issue? Joint pain when taking

## 2018-10-24 NOTE — Telephone Encounter (Signed)
Spoke with the pt and informed her that per Dr Meda Coffee, she wanted me to ask if she has ever tried rosuvastatin before, and if she hasn't then we will d/c her pravastatin and start her on rosuvastatin 5 mg po 3 times weekly. Pt states she has never tried rosuvastatin and is willing to give this a try.  Informed the pt that I will send in only a month supply with a few refills to her confirmed pharmacy of choice, to make sure she tolerates this appropriately or not.  Pt verbalized understanding and agrees with this plan.

## 2018-10-24 NOTE — Telephone Encounter (Signed)
PLAN:  In order of problems listed above:  1. Palpitations, Normal echocardiogram, normal left atrial size, normal labs including electrolytes and TSH. Holter monitor showed 700 PACs in 36 hours and few very short runs of atrial tachycardia, significant symptoms improvement with metoprolol, this was recently increased to 25 mg twice daily.   2. Blood pressure is well controlled 3. Hyperlipidemia followed by her primary care physician, she is on pravastatin, now experiencing muscle pain, we will hold any for symptoms improve we will discontinue, and start Zetia 4. GERD -hold omeprazole and start ranitidine 150 mg p.o. twice daily  Follow-up in 6 months.    Above is reference from Dr Francesca Oman OV Assessment and Plan with pt on 10/08/18:  Pt is calling back as instructed by Dr Meda Coffee on 10/08/18, to report how her symptoms are since holding pravastatin x 2 weeks.  Pt states that she has a multitude of joint related issues and diagnosis, so with the hold of Pravastatin, it did not completely take her pain and joint aches away, but did ease it off a bit.  Pt even stated that after she held this for 2 weeks, she decided to take one dose of pravastatin one night, and when she woke up the next morning, she could felt extremely stiff in her joints.  Pt has not taken anymore thereafter, since testing that theory.  Informed the pt that I will route this message to Dr Meda Coffee to review and advise on, and follow-up with the pt thereafter, once recommendations received. Pt verbalized understanding and agrees with this plan.

## 2018-10-24 NOTE — Telephone Encounter (Signed)
Please ask if she has tried rosuvastatin before and if not start 5 mg po 3x/week

## 2018-10-29 ENCOUNTER — Telehealth (INDEPENDENT_AMBULATORY_CARE_PROVIDER_SITE_OTHER): Payer: Medicare Other | Admitting: Internal Medicine

## 2018-10-29 ENCOUNTER — Other Ambulatory Visit: Payer: Self-pay

## 2018-10-29 DIAGNOSIS — N8110 Cystocele, unspecified: Secondary | ICD-10-CM

## 2018-10-29 DIAGNOSIS — M810 Age-related osteoporosis without current pathological fracture: Secondary | ICD-10-CM

## 2018-10-29 DIAGNOSIS — E7800 Pure hypercholesterolemia, unspecified: Secondary | ICD-10-CM

## 2018-10-29 DIAGNOSIS — B9689 Other specified bacterial agents as the cause of diseases classified elsewhere: Secondary | ICD-10-CM

## 2018-10-29 DIAGNOSIS — M5136 Other intervertebral disc degeneration, lumbar region: Secondary | ICD-10-CM | POA: Diagnosis not present

## 2018-10-29 DIAGNOSIS — M51369 Other intervertebral disc degeneration, lumbar region without mention of lumbar back pain or lower extremity pain: Secondary | ICD-10-CM

## 2018-10-29 DIAGNOSIS — K219 Gastro-esophageal reflux disease without esophagitis: Secondary | ICD-10-CM

## 2018-10-29 DIAGNOSIS — J309 Allergic rhinitis, unspecified: Secondary | ICD-10-CM

## 2018-10-29 DIAGNOSIS — N760 Acute vaginitis: Secondary | ICD-10-CM | POA: Diagnosis not present

## 2018-10-29 DIAGNOSIS — R739 Hyperglycemia, unspecified: Secondary | ICD-10-CM

## 2018-10-29 MED ORDER — CALCIUM 600+D3 PLUS MINERALS 600-800 MG-UNIT PO TABS: 2.0000 | ORAL_TABLET | Freq: Every day | ORAL | 3 refills | Status: DC

## 2018-10-29 MED ORDER — METRONIDAZOLE 500 MG PO TABS: 500.0000 mg | ORAL_TABLET | Freq: Three times a day (TID) | ORAL | 0 refills | Status: DC

## 2018-10-29 NOTE — Patient Instructions (Signed)
Please schedule your CPE with me with fasting labs before.  You have a wellness visit scheduled with Webb Silversmith, NP in September.  Let's try flagyl for possible bacterial vaginosis.  If you do not get better, contact Dr. Lindi Adie because the discharge may be coming from your letrozole.  I sent in a prescription for your calcium with vitamin D supplement 2 tabs daily.    We'll also see how you do with crestor instead of pravachol for your cholesterol.  It typically works better to lower the cholesterol.

## 2018-10-29 NOTE — Progress Notes (Signed)
This service is provided via telemedicine  Location of patient (ex: home, work):  HOME  Patient consents to a telephone visit:  YES  Location of the provider (ex: office, home):  OFFICE  Name of any referring provider:  Annaly Skop, DO  Names of all persons participating in the telemedicine service and their role in the encounter:  Edwin Dada, Cornelius, [name], DO, physician Durward Fortes, patient  Time spent on call:  3:09  This service is provided via telemedicine  .Virtual Visit via Telephone Note  I connected with Rebecca Guzman on 10/29/18 at 10:00 AM EDT by telephone and verified that I am speaking with the correct person using two identifiers.   I discussed the limitations, risks, security and privacy concerns of performing an evaluation and management service by telephone and the availability of in person appointments. I also discussed with the patient that there may be a patient responsible charge related to this service. The patient expressed understanding and agreed to proceed.   History of Present Illness: 85 yo female longstanding office patient with h/o breast cancer currently on hormonal therapy, generalized osteoarthritis, lumbar DDD with sciatica, seasonal allergies which have not yet been a bother to her, hyperlipidemia, htn and GERD (also not bothersome lately).    Breast ca:  Going well.  Not a bit of trouble lately.  Getting used to letrozole.  Made her really tired at first in Nov. She thinks she will be ok with it.  Sees Dr. Lindi Adie in July.    She has 2-3 other concerns. She did not have a lab appt--none ordered.  Last labs were oct '19.  She likes to keep a close watch on her hba1c and lipids.  [facility]  [place] is walking in her neighborhood.  Saw Dr. Meda Coffee first of the month.  She questioned her about muscle and joint pain.  She opted to change her pravastatin to crestor three days a week.  She wanted her  off the pravastatin 2 weeks first, then start crestor three times weekly.  Says she may have a little less pain after 2 weeks.  She decided to try one one night and was stiffer than ever first thing in the morning after that.  She also has her back and pinched nerve and takes the prolia and takes the letrozole.    Gets her pessary changed every other month.  For several weeks, maybe two months, she's been having an odor to her urine.  She thought she had a UTI.  She also saw a little vaginal bleeding.  He did not think it was related to her pessary when she went to the last appt.  UA was done and she was given 7 days of abx.  UA was negative.  She still has a vaginal odor.  She got on webmd and saw yeast infection or bacterial vaginosis.  She has no other symptoms--no burning, itching or urgency.  Stopped fish oil for a while.  It is a fishy smell.  She has just a little bladder leak even with the pessary.  She says that could actually be a discharge.  It looks like urine--stained color.       She has to keep her calcium intake up with the letrozole and while taking her prolia.  Takes the calcium 600mg  with 500 units D3.   Rarely takes rolaids for reflux.  No vertigo.  Back is actually  doing pretty well right now.    Left knee aches, too, with the letrozole.  Will get labs before her next visit.    Observations/Objective: Alert, oriented, provides good history as above.  Assessment and Plan: 1. Bacterial vaginosis -suspect, but could be discharge from letrozole - if does not improve with tx, pt will call Dr. Lindi Adie - metroNIDAZOLE (FLAGYL) 500 MG tablet; Take 1 tablet (500 mg total) by mouth 3 (three) times daily.  Dispense: 21 tablet; Refill: 0 - CBC with Differential/Platelet; Future  2. Senile osteoporosis -cont prolia, weightbearing exercise and ca with D 2 tabs daily--Rx sent in to decrease trips to pharmacy (90 day supply sent) - Calcium Carbonate-Vit D-Min (CALCIUM 600+D3 PLUS  MINERALS) 600-800 MG-UNIT TABS; Take 2 tablets by mouth daily.  Dispense: 180 tablet; Refill: 3 - COMPLETE METABOLIC PANEL WITH GFR; Future  3. DDD (degenerative disc disease), lumbar -doing a little better lately -cont walking (can't do y water walking as it's closed)  4. Hyperglycemia -was doing great -f/u labs in September if routine is back to normal for office practice - Hemoglobin A1c; Future  5. Pure hypercholesterolemia - has been fairly good--there is question if pravachol caused her aches and pains--she thinks they might be a little better off of it -I agreed with trying crestor 3x weekly to see if she can tolerate it better and keep her numbers down - Lipid panel; Future  6. Bladder prolapse, female, acquired -continues pessary use and pessary was not causing the odor or discharge she's experiencing -tx for BV and if no better, she'll call Dr. Lindi Adie as hormonal therapies have been known sometimes to cause vaginal d/c  7. Gastroesophageal reflux disease without esophagitis -well controlled, rarely needs rolaids  8. Allergic sinusitis -doing well lately without symptoms, no changes needed  Follow Up Instructions: Entered in patient instructions:  Please schedule your CPE with me with fasting labs before.  You have a wellness visit scheduled with Webb Silversmith, NP in September.  Let's try flagyl for possible bacterial vaginosis.  If you do not get better, contact Dr. Lindi Adie because the discharge may be coming from your letrozole.  I sent in a prescription for your calcium with vitamin D supplement 2 tabs daily.    We'll also see how you do with crestor instead of pravachol for your cholesterol.  It typically works better to lower the cholesterol.    I discussed the assessment and treatment plan with the patient. The patient was provided an opportunity to ask questions and all were answered. The patient agreed with the plan and demonstrated an understanding of the instructions.    The patient was advised to call back or seek an in-person evaluation if the symptoms worsen or if the condition fails to improve as anticipated.  F/u 6 mos for CPE, fasting labs before  I provided 24 minutes of non-face-to-face time during this encounter.  Itzia Cunliffe L. Travarus Trudo, D.O. [facility] [address], [place] [address] Cell Phone (Mon-Fri 8am-5pm):  [phone] On Call:  [phone] & follow prompts after 5pm & weekends Office Phone:  [phone] Office Fax:  [phone]

## 2018-10-30 ENCOUNTER — Telehealth: Payer: Self-pay | Admitting: *Deleted

## 2018-10-30 DIAGNOSIS — B9689 Other specified bacterial agents as the cause of diseases classified elsewhere: Secondary | ICD-10-CM

## 2018-10-30 DIAGNOSIS — N760 Acute vaginitis: Principal | ICD-10-CM

## 2018-10-30 DIAGNOSIS — M810 Age-related osteoporosis without current pathological fracture: Secondary | ICD-10-CM

## 2018-10-30 MED ORDER — METRONIDAZOLE 500 MG PO TABS: 500.0000 mg | ORAL_TABLET | Freq: Three times a day (TID) | ORAL | 0 refills | Status: DC

## 2018-10-30 MED ORDER — CALCIUM 600+D3 PLUS MINERALS 600-800 MG-UNIT PO TABS: 2.0000 | ORAL_TABLET | Freq: Every day | ORAL | 3 refills | Status: DC

## 2018-10-30 NOTE — Telephone Encounter (Signed)
Patient called and stated that Dr. Mariea Clonts was going to send in 2 Rx's for her yesterday but her pharmacy never received them.  I reviewed Dr. Cyndi Lennert note and refaxed.

## 2018-12-24 ENCOUNTER — Other Ambulatory Visit: Payer: Self-pay

## 2018-12-24 ENCOUNTER — Ambulatory Visit (INDEPENDENT_AMBULATORY_CARE_PROVIDER_SITE_OTHER): Payer: Medicare Other

## 2018-12-24 DIAGNOSIS — M810 Age-related osteoporosis without current pathological fracture: Secondary | ICD-10-CM

## 2018-12-24 MED ORDER — DENOSUMAB 60 MG/ML ~~LOC~~ SOSY
60.0000 mg | PREFILLED_SYRINGE | Freq: Once | SUBCUTANEOUS | Status: AC
  Administered 2018-12-24: 11:00:00 60 mg via SUBCUTANEOUS

## 2019-02-05 ENCOUNTER — Other Ambulatory Visit: Payer: Self-pay | Admitting: Cardiology

## 2019-02-25 NOTE — Assessment & Plan Note (Signed)
05/17/2018: Right lumpectomy: IDC, 5.5 mm, grade 1, margins negative, ER 100%, PR 100%, HER-2 1+ by IHC, Ki-67 less than 1%, T1BNX stage Ia We did not recommend adjuvant radiation therapy because of her favorable profile and her age.  Current treatment: Letrozole 2.5 mg daily started 05/28/2018 Letrozole toxicities:  Breast cancer surveillance: Mammogram scheduled for 04/09/2019 Return to clinic in 1 year for follow-up

## 2019-03-01 NOTE — Progress Notes (Signed)
Patient Care Team: [name], DO as PCP - General (Geriatric Medicine) [name], MD as PCP - Cardiology (Cardiology) [name], MD as Consulting Physician (General Surgery) [name], MD as Consulting Physician (Hematology and Oncology) [name], MD as Consulting Physician (Radiation Oncology) Delice Bison Charlestine Massed, NP as Nurse Practitioner (Hematology and Oncology)  DIAGNOSIS:    ICD-10-CM   1. Malignant neoplasm of upper-inner quadrant of right breast in female, estrogen receptor positive ([place])  C50.211    Z17.0     SUMMARY OF ONCOLOGIC HISTORY: Oncology History Overview Note  PMS2 VUS on Multi-cancer panel.   Malignant neoplasm of upper-inner quadrant of right breast in female, estrogen receptor positive ([place])  1992 Miscellaneous   Left breast cancer treated with mastectomy followed by adjuvant chemotherapy and 5 years of tamoxifen   04/17/2018 Initial Diagnosis   Screening detected right breast mass, by ultrasound measured 5 mm UIQ middle depth, biopsy revealed grade 1 IDC with DCIS, ER 100%, PR 100%, HER-2 -1+ by IHC, T1 a N0 stage I a clinical stage   05/17/2018 Surgery   Right lumpectomy: IDC, 5.5 mm, grade 1, margins negative, ER 100%, PR 100%, HER-2 1+ by IHC, Ki-67 less than 1%, T1BNX stage Ia   05/30/2018 Cancer Staging   Staging form: Breast, AJCC 8th Edition - Pathologic: Stage IA (pT1b, pN0, cM0, G1, ER+, PR+, HER2-) - Signed by Gardenia Phlegm, NP on 05/30/2018   06/05/2018 Genetic Testing   PMS2 c.2559C>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  The Multi-Gene Panel offered by [facility] includes sequencing and/or deletion duplication testing of the following 84 genes: AIP, ALK, APC, ATM, AXIN2,BAP1,  BARD1, BLM, BMPR1A, BRCA1, BRCA2, BRIP1, CASR, CDC73, CDH1, CDK4, CDKN1B, CDKN1C, CDKN2A (p14ARF), CDKN2A (p16INK4a), CEBPA, CHEK2, CTNNA1, DICER1, DIS3L2, EGFR (c.2369C>T, p.Thr790Met variant only), EPCAM  (Deletion/duplication testing only), FH, FLCN, GATA2, GPC3, GREM1 (Promoter region deletion/duplication testing only), HOXB13 (c.251G>A, p.Gly84Glu), HRAS, KIT, MAX, MEN1, MET, MITF (c.952G>A, p.Glu318Lys variant only), MLH1, MSH2, MSH3, MSH6, MUTYH, NBN, NF1, NF2, NTHL1, PALB2, PDGFRA, PHOX2B, PMS2, POLD1, POLE, POT1, PRKAR1A, PTCH1, PTEN, RAD50, RAD51C, RAD51D, RB1, RECQL4, RET, RUNX1, SDHAF2, SDHA (sequence changes only), SDHB, SDHC, SDHD, SMAD4, SMARCA4, SMARCB1, SMARCE1, STK11, SUFU, TERC, TERT, TMEM127, TP53, TSC1, TSC2, VHL, WRN and WT1.  The report date is 06/05/2018.   06/2018 -  Anti-estrogen oral therapy   Letrozole daily     CHIEF COMPLIANT: Follow-up of right breast cancer on letrozole  INTERVAL HISTORY: Rebecca Guzman is a 85 y.o. with above-mentioned history of right breast cancer who underwent a lumpectomy and is currently on anti-estrogen therapy with letrozole. I last saw her 9 months ago, and in the interim she was seen by Wilber Bihari, NP for survivorship clinic. She presents to the clinic today for follow-up.  She has been having lots of fatigue and muscle aches and pains with letrozole therapy.  Her primary care physician switched her from pravastatin to rosuvastatin but her pain only slightly got better.  REVIEW OF SYSTEMS:   Constitutional: Denies fevers, chills or abnormal weight loss, complains of severe fatigue Eyes: Denies blurriness of vision Ears, nose, mouth, throat, and face: Denies mucositis or sore throat Respiratory: Denies cough, dyspnea or wheezes Cardiovascular: Denies palpitation, chest discomfort Gastrointestinal: Denies nausea, heartburn or change in bowel habits Skin: Denies abnormal skin rashes Lymphatics: Denies new lymphadenopathy or easy bruising Neurological: Denies numbness, tingling or new weaknesses Behavioral/Psych: Mood is stable, no new changes  Extremities: Diffuse muscle aches and  pains no lower extremity edema Breast: denies any pain  or lumps or nodules in either breasts All other systems were reviewed with the patient and are negative.  I have reviewed the past medical history, past surgical history, social history and family history with the patient and they are unchanged from previous note.  ALLERGIES:  is allergic to codeine and morphine and related.  MEDICATIONS:  Current Outpatient Medications  Medication Sig Dispense Refill  . acetaminophen (TYLENOL) 500 MG tablet Take 1,000 mg by mouth every morning.     . Calcium Carbonate-Vit D-Min (CALCIUM 600+D3 PLUS MINERALS) 600-800 MG-UNIT TABS Take 2 tablets by mouth daily. 180 tablet 3  . letrozole (FEMARA) 2.5 MG tablet Take 1 tablet (2.5 mg total) by mouth daily. 90 tablet 3  . loratadine (CLARITIN) 10 MG tablet Take 10 mg by mouth daily.    . metoprolol tartrate (LOPRESSOR) 25 MG tablet Take 1 tablet (25 mg total) by mouth 2 (two) times daily. 180 tablet 3  . metroNIDAZOLE (FLAGYL) 500 MG tablet Take 1 tablet (500 mg total) by mouth 3 (three) times daily. 21 tablet 0  . Multiple Vitamin (MULTIVITAMIN) tablet Take 1 tablet by mouth daily.    . Omega-3 Fatty Acids (FISH OIL) 1000 MG CAPS Take 1 capsule (1,000 mg total) by mouth daily.  0  . omeprazole (PRILOSEC) 40 MG capsule TAKE 1 CAPSULE BY MOUTH TWICE A DAY 180 capsule 1  . Probiotic Product (PROBIOTIC FORMULA PO) Take 1 tablet by mouth every evening.     . rosuvastatin (CRESTOR) 5 MG tablet TAKE 1 TABLET(5 MG) BY MOUTH 3 TIMES A WEEK 270 tablet 2   No current facility-administered medications for this visit.     PHYSICAL EXAMINATION: ECOG PERFORMANCE STATUS: 1 - Symptomatic but completely ambulatory  Vitals:   03/04/19 0937  BP: (!) 121/57  Pulse: 87  Resp: 17  Temp: 98.5 F (36.9 C)  SpO2: 98%   Filed Weights   03/04/19 0937  Weight: 174 lb 3.2 oz (79 kg)    Physical exam not done due to COVID-19 precautions.  LABORATORY DATA:  I have reviewed the data as listed CMP Latest Ref Rng & Units  05/10/2018 04/25/2018 10/19/2017  Glucose 70 - 99 mg/dL 105(H) 118(H) 98  BUN 8 - 23 mg/dL _0 Creatinine 0.44 - 1.00 mg/dL 0.90 0.84 0.83  Sodium 135 - 145 mmol/L 139 143 140  Potassium 3.5 - 5.1 mmol/L 4.2 3.7 4.0  Chloride 98 - 111 mmol/L 102 109 104  CO2 22 - 32 mmol/L _1 Calcium 8.9 - 10.3 mg/dL 9.7 9.4 9.6  Total Protein 6.5 - 8.1 g/dL - 6.9 6.5  Total Bilirubin 0.3 - 1.2 mg/dL - 0.6 0.5  Alkaline Phos 38 - 126 U/L - 60 -  AST 15 - 41 U/L - 18 17  ALT 0 - 44 U/L - 19 16    Lab Results  Component Value Date   WBC 9.7 05/10/2018   HGB 13.3 05/10/2018   HCT 41.2 05/10/2018   MCV 92.2 05/10/2018   PLT 239 05/10/2018   NEUTROABS 3.9 04/25/2018    ASSESSMENT & PLAN:  Malignant neoplasm of upper-inner quadrant of right breast in female, estrogen receptor positive ([place]) 05/17/2018: Right lumpectomy: IDC, 5.5 mm, grade 1, margins negative, ER 100%, PR 100%, HER-2 1+ by IHC, Ki-67 less than 1%, T1BNX stage Ia We did not recommend adjuvant radiation therapy because of her favorable profile and her age.  Current treatment: Letrozole 2.5 mg daily started 05/28/2018, switched to anastrozole 03/23/2019 Letrozole toxicities: Severe fatigue and severe muscle aches and pains and joint pains.  She already has previous osteoarthritis and it appears to have gotten worse. We decided to discontinue letrozole Start on anastrozole half a tablet daily in 2 to 3 weeks and then increase it to full tablet. We will see her back with a phone visit in 3 months.  Breast cancer surveillance: Mammogram scheduled for 04/09/2019 We will conduct a telephone visit in 3 months to assess toxicities on anastrozole    No orders of the defined types were placed in this encounter.  The patient has a good understanding of the overall plan. she agrees with it. she will call with any problems that may develop before the next visit here.  [name], MD 03/04/2019  Rebecca Guzman am acting as  scribe for Dr. [name].  I have reviewed the above documentation for accuracy and completeness, and I agree with the above.

## 2019-03-04 ENCOUNTER — Other Ambulatory Visit: Payer: Self-pay

## 2019-03-04 ENCOUNTER — Inpatient Hospital Stay: Payer: Medicare Other | Attending: Hematology and Oncology | Admitting: Hematology and Oncology

## 2019-03-04 DIAGNOSIS — Z79899 Other long term (current) drug therapy: Secondary | ICD-10-CM | POA: Diagnosis not present

## 2019-03-04 DIAGNOSIS — C50211 Malignant neoplasm of upper-inner quadrant of right female breast: Secondary | ICD-10-CM

## 2019-03-04 DIAGNOSIS — Z170 Estrogen receptor positive status [ER+]: Secondary | ICD-10-CM | POA: Insufficient documentation

## 2019-03-04 DIAGNOSIS — Z9012 Acquired absence of left breast and nipple: Secondary | ICD-10-CM

## 2019-03-04 DIAGNOSIS — Z853 Personal history of malignant neoplasm of breast: Secondary | ICD-10-CM | POA: Insufficient documentation

## 2019-03-04 DIAGNOSIS — Z79811 Long term (current) use of aromatase inhibitors: Secondary | ICD-10-CM | POA: Diagnosis not present

## 2019-03-04 MED ORDER — ANASTROZOLE 1 MG PO TABS: 1.0000 mg | ORAL_TABLET | Freq: Every day | ORAL | 0 refills | Status: DC

## 2019-03-05 ENCOUNTER — Telehealth: Payer: Self-pay | Admitting: Hematology and Oncology

## 2019-03-05 NOTE — Telephone Encounter (Signed)
I talk with patient regarding schedule  

## 2019-04-09 ENCOUNTER — Other Ambulatory Visit: Payer: Self-pay

## 2019-04-09 ENCOUNTER — Ambulatory Visit
Admission: RE | Admit: 2019-04-09 | Discharge: 2019-04-09 | Disposition: A | Discharge status: Home / Self Care | Payer: Medicare Other | Source: Ambulatory Visit | Attending: Adult Health | Admitting: Adult Health

## 2019-04-09 DIAGNOSIS — Z170 Estrogen receptor positive status [ER+]: Secondary | ICD-10-CM

## 2019-04-09 DIAGNOSIS — C50211 Malignant neoplasm of upper-inner quadrant of right female breast: Secondary | ICD-10-CM

## 2019-04-19 ENCOUNTER — Other Ambulatory Visit: Payer: Self-pay | Admitting: Hematology and Oncology

## 2019-04-19 DIAGNOSIS — C50211 Malignant neoplasm of upper-inner quadrant of right female breast: Secondary | ICD-10-CM

## 2019-04-19 DIAGNOSIS — Z170 Estrogen receptor positive status [ER+]: Secondary | ICD-10-CM

## 2019-04-22 ENCOUNTER — Other Ambulatory Visit: Payer: Self-pay

## 2019-04-22 ENCOUNTER — Other Ambulatory Visit: Payer: Medicare Other

## 2019-04-22 DIAGNOSIS — M810 Age-related osteoporosis without current pathological fracture: Secondary | ICD-10-CM

## 2019-04-22 DIAGNOSIS — E7800 Pure hypercholesterolemia, unspecified: Secondary | ICD-10-CM

## 2019-04-22 DIAGNOSIS — B9689 Other specified bacterial agents as the cause of diseases classified elsewhere: Secondary | ICD-10-CM

## 2019-04-22 DIAGNOSIS — N760 Acute vaginitis: Secondary | ICD-10-CM

## 2019-04-22 DIAGNOSIS — R739 Hyperglycemia, unspecified: Secondary | ICD-10-CM

## 2019-04-23 LAB — COMPLETE METABOLIC PANEL WITH GFR
AG Ratio: 2 (calc) (ref 1.0–2.5)
ALT: 14 U/L (ref 6–29)
AST: 17 U/L (ref 10–35)
Albumin: 4.3 g/dL (ref 3.6–5.1)
Alkaline phosphatase (APISO): 54 U/L (ref 37–153)
BUN: 16 mg/dL (ref 7–25)
CO2: 27 mmol/L (ref 20–32)
Calcium: 9.5 mg/dL (ref 8.6–10.4)
Chloride: 108 mmol/L (ref 98–110)
Creat: 0.78 mg/dL (ref 0.60–0.88)
GFR, Est African American: 81 mL/min/{1.73_m2} (ref >=60)
GFR, Est Non African American: 70 mL/min/{1.73_m2} (ref >=60)
Globulin: 2.1 g/dL (calc) (ref 1.9–3.7)
Glucose, Bld: 107 mg/dL — ABNORMAL HIGH (ref 65–99)
Potassium: 4.3 mmol/L (ref 3.5–5.3)
Sodium: 142 mmol/L (ref 135–146)
Total Bilirubin: 0.5 mg/dL (ref 0.2–1.2)
Total Protein: 6.4 g/dL (ref 6.1–8.1)

## 2019-04-23 LAB — HEMOGLOBIN A1C
Hgb A1c MFr Bld: 5.5 % of total Hgb (ref <5.7)
Mean Plasma Glucose: 111 (calc)
eAG (mmol/L): 6.2 (calc)

## 2019-04-23 LAB — LIPID PANEL
Cholesterol: 192 mg/dL (ref <200)
HDL: 59 mg/dL (ref >=50)
LDL Cholesterol (Calc): 107 mg/dL (calc) — ABNORMAL HIGH
Non-HDL Cholesterol (Calc): 133 mg/dL (calc) — ABNORMAL HIGH (ref <130)
Total CHOL/HDL Ratio: 3.3 (calc) (ref <5.0)
Triglycerides: 147 mg/dL (ref <150)

## 2019-04-23 LAB — CBC WITH DIFFERENTIAL/PLATELET
Absolute Monocytes: 470 cells/uL (ref 200–950)
Basophils Absolute: 49 cells/uL (ref 0–200)
Basophils Relative: 0.8 %
Eosinophils Absolute: 159 cells/uL (ref 15–500)
Eosinophils Relative: 2.6 %
HCT: 39.9 % (ref 35.0–45.0)
Hemoglobin: 13.2 g/dL (ref 11.7–15.5)
Lymphs Abs: 1861 cells/uL (ref 850–3900)
MCH: 30.4 pg (ref 27.0–33.0)
MCHC: 33.1 g/dL (ref 32.0–36.0)
MCV: 91.9 fL (ref 80.0–100.0)
MPV: 11.6 fL (ref 7.5–12.5)
Monocytes Relative: 7.7 %
Neutro Abs: 3562 cells/uL (ref 1500–7800)
Neutrophils Relative %: 58.4 %
Platelets: 216 10*3/uL (ref 140–400)
RBC: 4.34 10*6/uL (ref 3.80–5.10)
RDW: 13.1 % (ref 11.0–15.0)
Total Lymphocyte: 30.5 %
WBC: 6.1 10*3/uL (ref 3.8–10.8)

## 2019-04-29 ENCOUNTER — Other Ambulatory Visit: Payer: Self-pay | Admitting: Cardiology

## 2019-04-29 DIAGNOSIS — E782 Mixed hyperlipidemia: Secondary | ICD-10-CM

## 2019-04-29 DIAGNOSIS — E785 Hyperlipidemia, unspecified: Secondary | ICD-10-CM

## 2019-04-29 DIAGNOSIS — R002 Palpitations: Secondary | ICD-10-CM

## 2019-04-29 DIAGNOSIS — I493 Ventricular premature depolarization: Secondary | ICD-10-CM

## 2019-04-30 ENCOUNTER — Telehealth: Payer: Self-pay | Admitting: Physical Medicine and Rehabilitation

## 2019-04-30 NOTE — Telephone Encounter (Signed)
Ok if nothing remotely new etc. Otherwise ov

## 2019-05-01 NOTE — Telephone Encounter (Signed)
[employer]. Is auth needed for [id number]? Scheduled for 10/8 with driver.

## 2019-05-01 NOTE — Telephone Encounter (Signed)
Notification or Prior Authorization is not required for the requested services  This [employer] Medicare Advantage members plan does not currently require a prior authorization for [id number]  Decision ID [id number]

## 2019-05-02 ENCOUNTER — Ambulatory Visit (INDEPENDENT_AMBULATORY_CARE_PROVIDER_SITE_OTHER): Payer: Medicare Other | Admitting: Internal Medicine

## 2019-05-02 ENCOUNTER — Encounter: Payer: Self-pay | Admitting: Internal Medicine

## 2019-05-02 ENCOUNTER — Other Ambulatory Visit: Payer: Self-pay

## 2019-05-02 VITALS — BP 122/74 | HR 75 | Temp 98.4°F | Ht 65.0 in | Wt 175.4 lb

## 2019-05-02 DIAGNOSIS — C50211 Malignant neoplasm of upper-inner quadrant of right female breast: Secondary | ICD-10-CM | POA: Diagnosis not present

## 2019-05-02 DIAGNOSIS — M47816 Spondylosis without myelopathy or radiculopathy, lumbar region: Secondary | ICD-10-CM | POA: Diagnosis not present

## 2019-05-02 DIAGNOSIS — Z7189 Other specified counseling: Secondary | ICD-10-CM

## 2019-05-02 DIAGNOSIS — Z23 Encounter for immunization: Secondary | ICD-10-CM | POA: Diagnosis not present

## 2019-05-02 DIAGNOSIS — M810 Age-related osteoporosis without current pathological fracture: Secondary | ICD-10-CM

## 2019-05-02 DIAGNOSIS — N8110 Cystocele, unspecified: Secondary | ICD-10-CM

## 2019-05-02 DIAGNOSIS — E7800 Pure hypercholesterolemia, unspecified: Secondary | ICD-10-CM | POA: Insufficient documentation

## 2019-05-02 DIAGNOSIS — J309 Allergic rhinitis, unspecified: Secondary | ICD-10-CM

## 2019-05-02 DIAGNOSIS — Z0000 Encounter for general adult medical examination without abnormal findings: Secondary | ICD-10-CM

## 2019-05-02 DIAGNOSIS — Z170 Estrogen receptor positive status [ER+]: Secondary | ICD-10-CM

## 2019-05-02 MED ORDER — ROSUVASTATIN CALCIUM 5 MG PO TABS: 5.0000 mg | ORAL_TABLET | ORAL | 3 refills | Status: DC

## 2019-05-02 NOTE — Progress Notes (Signed)
Provider:  Rexene Edison. Mariea Clonts, D.O., C.M.D. Location:   [facility]   Place of Service:   clinic  Previous PCP: [name], DO Patient Care Team: [name], DO as PCP - General (Geriatric Medicine) [name], MD as PCP - Cardiology (Cardiology) [name], MD as Consulting Physician (General Surgery) [name], MD as Consulting Physician (Hematology and Oncology) [name], MD as Consulting Physician (Radiation Oncology) Delice Bison Charlestine Massed, NP as Nurse Practitioner (Hematology and Oncology)  Extended Emergency Contact Information Primary Emergency Contact: [name],[name] Address: [address]          [place], [place] [address] [name] Phone: [phone] Mobile Phone: [phone] Relation: Daughter Secondary Emergency Contact: Windle,[name] Address: [address]           [place], [place] [address] [place] Phone: [phone] Mobile Phone: [phone] Relation: Daughter  Code Status: DNR, MOST done today Goals of Care: Advanced Directive information Advanced Directives 05/10/2018  Does Patient Have a Medical Advance Directive? Yes  Type of Advance Directive -  Does patient want to make changes to medical advance directive? -  Copy of [place] in Chart? -  Would patient like information on creating a medical advance directive? -  Pre-existing out of facility DNR order (yellow form or pink MOST form) -   Chief Complaint  Patient presents with  . Annual Exam    Physical  . Immunizations    Flu shot given today  . Advanced Directive    MOST form to be completed    HPI: Patient is a 85 y.o. female seen today for an annual physical exam.  Took flu shot  Letrozole was started in 11/19 after her recurrent breast cancer.  She was so tired, exhausted, aching and hurting all over with the letrozole so she was changed to anastrazole--on a couple of months.  Energy is  better, but still having joint pain.  Of course, she has her scoliosis and slipped disc so feels like it intensifies it.  Says it's also causing vaginal irritation/dryness.  Had her pessary changed and her gyn recommended she use replens.   She had taken tamoxifen for 5 years after she first had breast cancer.  She does not recall the side effects from it.  She'd asked Dr. Lindi Adie about it and he had said he liked letrozole better.   Tried to walk outside in summer, but couldn't do her water walking.  Takes crestor just 3 times per week.   Has epidural in "right hip" coming up.  It's getting difficult to raise her leg to get into the car so she figured she was due.    We reviewed her MOST form.  She discussed with her family.  She notes that they tried to make her change her mind, but they were not understanding that the form applies during an end-of-life scenario not right at this moment in time.  She agrees to DNR, limited additional interventions, antibiotics if indicated, IVFs for a defined trial period, no tube feeding.  She will again discuss with her family due to the changes she made when she understood the form better from our discussion. We will review annually.    Past Medical History:  Diagnosis Date  . Asthma   . BPPV (benign paroxysmal positional vertigo)   . Compression fracture of L3 lumbar vertebra 04/06/2015  . Family history of  breast cancer   . Family history of colon cancer   . Family history of pancreatic cancer   . GERD (gastroesophageal reflux disease)   . Hiatal hernia    pt states she doesnt have this  . History of adenomatous polyp of colon    tubular adenoma 2005 and  2014 tubular adenoma and  hyperplastic polyp  . History of breast cancer per pt no recurrence   dx 1992 --  s/p  left breast mastectomy and chemotherapy  . Hyperlipidemia LDL goal <100   . Osteoarthritis of right knee   . Personal history of chemotherapy 1992  . PONV (postoperative nausea and  vomiting)    and "gets weak and light-leaded"  . Sensation of pressure in bladder area   . Vaginal vault prolapse    anterior  . Wears glasses   . Wears hearing aid    left only   Past Surgical History:  Procedure Laterality Date  . BREAST BIOPSY Right 04/19/2018  . BREAST LUMPECTOMY Right 05/17/2018  . BREAST LUMPECTOMY WITH RADIOACTIVE SEED LOCALIZATION Right 05/17/2018   Procedure: RIGHT BREAST LUMPECTOMY WITH RADIOACTIVE SEED LOCALIZATION;  Surgeon: [name], MD;  Location: [facility];  Service: General;  Laterality: Right;  . CATARACT EXTRACTION W/ INTRAOCULAR LENS  IMPLANT, BILATERAL  2014  . CHOLECYSTECTOMY  1985  . COLONOSCOPY  last one 06-04-2013  . CYSTOCELE REPAIR N/A 02/29/2016   Procedure: ANTERIOR VAULT PROLAPSE REPAIR COLOPLAST [place] FIXATION AUGMENTED AXIS DERMIS REPAIR  ;  Surgeon: Carolan Clines, MD;  Location: [facility];  Service: Urology;  Laterality: N/A;  . ESOPHAGOGASTRODUODENOSCOPY  08-31-2009  . LAPAROSCOPIC ASSISTED VAGINAL HYSTERECTOMY  01-10-2000   w/ Bilateral Salpingoophorectomy /  Anterior & Posterior repair/  Pubovaginal Sling  . MASTECTOMY Left 1992    reports that she quit smoking about 35 years ago. Her smoking use included cigarettes. She quit after 13.00 years of use. She has never used smokeless tobacco. She reports that she does not drink alcohol or use drugs.  Functional Status Survey:    Family History  Problem Relation Age of Onset  . Heart attack Mother   . Colon cancer Mother 43  . Hypertension Mother   . Heart disease Father   . Breast cancer Sister        pat half sister dx in her 74s  . Pancreatic cancer Other 43    Health Maintenance  Topic Date Due  . TETANUS/TDAP  01/25/2025  . INFLUENZA VACCINE  Completed  . DEXA SCAN  Completed  . PNA vac Low Risk Adult  Completed    Allergies  Allergen Reactions  . Codeine Nausea And Vomiting  . Morphine And Related Other (See Comments)    Nausea, and  drop in blood pressure    Outpatient Encounter Medications as of 05/02/2019  Medication Sig  . acetaminophen (TYLENOL) 500 MG tablet Take 1,000 mg by mouth every morning.   Marland Kitchen anastrozole (ARIMIDEX) 1 MG tablet TAKE 1 TABLET(1 MG) BY MOUTH DAILY  . Black Elderberry 50 MG/5ML SYRP Take 5 mLs by mouth every Monday, Wednesday, and Friday.  . Calcium Carbonate-Vit D-Min (CALCIUM 600+D3 PLUS MINERALS) 600-800 MG-UNIT TABS Take 2 tablets by mouth daily.  Marland Kitchen loratadine (CLARITIN) 10 MG tablet Take 10 mg by mouth daily.  . metoprolol tartrate (LOPRESSOR) 25 MG tablet TAKE 1 TABLET(25 MG) BY MOUTH TWICE DAILY  . Multiple Vitamin (MULTIVITAMIN) tablet Take 1 tablet by mouth daily.  . Omega-3 Fatty Acids (FISH OIL)  1000 MG CAPS Take 1 capsule (1,000 mg total) by mouth daily.  Marland Kitchen omeprazole (PRILOSEC) 40 MG capsule TAKE 1 CAPSULE BY MOUTH TWICE A DAY  . Probiotic Product (PROBIOTIC FORMULA PO) Take 1 tablet by mouth every evening.   . rosuvastatin (CRESTOR) 5 MG tablet TAKE 1 TABLET(5 MG) BY MOUTH 3 TIMES A WEEK   No facility-administered encounter medications on file as of 05/02/2019.     Review of Systems  Constitutional: Negative for chills, fever and malaise/fatigue.  HENT: Positive for congestion and hearing loss.        Left hearing aid  Eyes: Negative for blurred vision.       Early macular per ophtho  Cardiovascular: Negative for chest pain, palpitations and leg swelling.  Gastrointestinal: Positive for heartburn. Negative for abdominal pain, blood in stool, constipation, diarrhea and melena.  Genitourinary: Negative for dysuria.       Some urgency and incontinence overnight, but pessary working great in daytime  Musculoskeletal: Positive for back pain and joint pain. Negative for falls.  Skin: Negative for itching and rash.  Neurological: Negative for dizziness and loss of consciousness.  Endo/Heme/Allergies: Positive for environmental allergies. Bruises/bleeds easily.    Psychiatric/Behavioral: Negative for depression and memory loss. The patient is not nervous/anxious and does not have insomnia.     Vitals:   05/02/19 1041  BP: 122/74  Pulse: 75  Temp: 98.4 F (36.9 C)  TempSrc: Oral  SpO2: 97%  Weight: 175 lb 6.4 oz (79.6 kg)  Height: 5\' 5"  (1.651 m)   Body mass index is 29.19 kg/m. Physical Exam Vitals signs reviewed.  Constitutional:      General: She is not in acute distress.    Appearance: Normal appearance. She is not ill-appearing or toxic-appearing.  HENT:     Head: Normocephalic and atraumatic.     Right Ear: Tympanic membrane, ear canal and external ear normal.     Left Ear: Tympanic membrane, ear canal and external ear normal.     Ears:     Comments: Left hearing aid     Nose:     Comments: Nose and mouth deferred with covid masking Eyes:     Extraocular Movements: Extraocular movements intact.     Conjunctiva/sclera: Conjunctivae normal.     Pupils: Pupils are equal, round, and reactive to light.  Neck:     Musculoskeletal: Normal range of motion.  Cardiovascular:     Rate and Rhythm: Normal rate and regular rhythm.     Pulses: Normal pulses.     Heart sounds: Normal heart sounds.  Pulmonary:     Effort: Pulmonary effort is normal.     Breath sounds: Normal breath sounds.  Abdominal:     General: Bowel sounds are normal. There is no distension.     Palpations: Abdomen is soft.     Tenderness: There is no abdominal tenderness. There is no guarding or rebound.  Neurological:     Mental Status: She is alert.     Labs reviewed: Basic Metabolic Panel: Recent Labs    05/10/18 1318 04/22/19 0803  NA 139 142  K 4.2 4.3  CL 102 108  CO2 27 27  GLUCOSE 105* 107*  BUN 15 16  CREATININE 0.90 0.78  CALCIUM 9.7 9.5   Liver Function Tests: Recent Labs    04/22/19 0803  AST 17  ALT 14  BILITOT 0.5  PROT 6.4   No results for input(s): LIPASE, AMYLASE in the last 8760 hours. No  results for input(s): AMMONIA in  the last 8760 hours. CBC: Recent Labs    05/10/18 1318 04/22/19 0803  WBC 9.7 6.1  NEUTROABS  --  3,562  HGB 13.3 13.2  HCT 41.2 39.9  MCV 92.2 91.9  PLT 239 216   Cardiac Enzymes: No results for input(s): CKTOTAL, CKMB, CKMBINDEX, TROPONINI in the last 8760 hours. BNP: Invalid input(s): POCBNP Lab Results  Component Value Date   HGBA1C 5.5 04/22/2019   Lab Results  Component Value Date   TSH 2.820 10/31/2016   Lab Results  Component Value Date   [id number] 10/19/2017   Lab Results  Component Value Date   FOLATE >19.9 01/15/2014   Assessment/Plan 1. Annual physical exam -performed today -sees gyn for pelvic exam, surgery for breast exam  2. Pure hypercholesterolemia - EKG 12-Lead  - rosuvastatin (CRESTOR) 5 MG tablet; Take 1 tablet (5 mg total) by mouth 4 (four) times a week.  Dispense: 48 tablet; Refill: 3 - Lipid panel; Future  3. Senile osteoporosis -is on arimidex which will worsen this -is on prolia and ca with D, does weightbearing exercise  4. Facet arthropathy, lumbar -for epidural injection coming up to help with pain -cont tylenol each morning and regular exercise  5. Bladder prolapse, female, acquired -gets regular pessary changes with gyn and this works great in the day, has some incontinence at hs  6. Malignant neoplasm of upper-inner quadrant of right breast in female, estrogen receptor positive ([place]) -cont arimidex, she will discuss more with Dr. Lindi Adie due to persistent arthralgias and vaginal dryness/irritation  7. Allergic sinusitis -some increase in congestion here in early fall -on claritin  8. ACP (advance care planning) -MOST completed today as above -spent about 25 minutes completing with her today  9. Need for influenza vaccination - Flu Vaccine QUAD High Dose(Fluad)  Labs/tests ordered:   Lab Orders     Lipid panel  Sumeet Geter L. Cordarro Spinnato, D.O. [facility] [address], [place] [address] Cell Phone (Mon-Fri 8am-5pm):  [phone] On Call:  [phone] & follow prompts after 5pm & weekends Office Phone:  [phone] Office Fax:  [phone]

## 2019-05-02 NOTE — Patient Instructions (Signed)
Try increasing crestor to 4x per week.  If you hurt too much more from that, let me know and we'll go back to 3x per week.

## 2019-05-03 ENCOUNTER — Other Ambulatory Visit: Payer: Self-pay

## 2019-05-03 ENCOUNTER — Encounter: Payer: Self-pay | Admitting: Family

## 2019-05-03 ENCOUNTER — Ambulatory Visit (INDEPENDENT_AMBULATORY_CARE_PROVIDER_SITE_OTHER): Payer: Medicare Other | Admitting: Family

## 2019-05-03 ENCOUNTER — Ambulatory Visit: Payer: Self-pay

## 2019-05-03 DIAGNOSIS — Z0000 Encounter for general adult medical examination without abnormal findings: Secondary | ICD-10-CM

## 2019-05-03 NOTE — Progress Notes (Signed)
Subjective:   Rebecca Guzman is a 85 y.o. female who presents for Medicare Annual (Subsequent) preventive examination.Patient completed AWVs on telephone.  Review of Systems:  Cardiac Risk Factors include: advanced age (>17men, >68 women);dyslipidemia;smoking/ tobacco exposure     Objective:     Vitals: LMP 11/21/1985   There is no height or weight on file to calculate BMI.  Advanced Directives 05/03/2019 05/03/2019 05/10/2018 04/30/2018 10/23/2017 04/03/2017 09/29/2016  Does Patient Have a Medical Advance Directive? Yes Yes Yes Yes Yes Yes Yes  Type of Advance Directive Out of facility DNR (pink MOST or yellow form);Healthcare Power of [employer] of facility DNR (pink MOST or yellow form) - Press photographer;Living will;Out of facility DNR (pink MOST or yellow form) [place];Out of facility DNR (pink MOST or yellow form) Out of facility DNR (pink MOST or yellow form) Out of facility DNR (pink MOST or yellow form)  Does patient want to make changes to medical advance directive? No - Patient declined No - Patient declined - No - Patient declined No - Patient declined No - Patient declined -  Copy of [place] in Chart? Yes - validated most recent copy scanned in chart (See row information) - - No - copy requested Yes - -  Would patient like information on creating a medical advance directive? - - - - - - -  Pre-existing out of facility DNR order (yellow form or pink MOST form) - Pink MOST form placed in chart (order not valid for inpatient use) - Yellow form placed in chart (order not valid for inpatient use) Yellow form placed in chart (order not valid for inpatient use) Yellow form placed in chart (order not valid for inpatient use);Pink MOST form placed in chart (order not valid for inpatient use) Yellow form placed in chart (order not valid for inpatient use)    Tobacco Social History   Tobacco Use  Smoking Status Former Smoker  . Years:  13.00  . Types: Cigarettes  . Quit date: 10/12/1983  . Years since quitting: 35.5  Smokeless Tobacco Never Used     Counseling given: Not Answered   Clinical Intake:  Pre-visit preparation completed: No  Pain : 0-10 Pain Score: 4  Pain Type: Chronic pain Pain Location: Back Pain Orientation: Lower Pain Radiating Towards: sometimes if sciatic nerve is involved but right now it is not. Pain Descriptors / Indicators: Aching Pain Onset: (4-5 years ago) Pain Frequency: Constant Pain Relieving Factors: Tylenol Effect of Pain on Daily Activities: heating pad,hot shower  Pain Relieving Factors: Tylenol  BMI - recorded: 29.19 Nutritional Status: BMI 25 -29 Overweight Nutritional Risks: None Diabetes: No  How often do you need to have someone help you when you read instructions, pamphlets, or other written materials from your doctor or pharmacy?: 1 - Never What is the last grade level you completed in school?: 2 yrs of college  Interpreter Needed?: No  Information entered by :: Allayah Raineri FNP-C  Past Medical History:  Diagnosis Date  . Asthma   . BPPV (benign paroxysmal positional vertigo)   . Compression fracture of L3 lumbar vertebra 04/06/2015  . Family history of breast cancer   . Family history of colon cancer   . Family history of pancreatic cancer   . GERD (gastroesophageal reflux disease)   . Hiatal hernia    pt states she doesnt have this  . History of adenomatous polyp of colon    tubular adenoma 2005  and  2014 tubular adenoma and  hyperplastic polyp  . History of breast cancer per pt no recurrence   dx 1992 --  s/p  left breast mastectomy and chemotherapy  . Hyperlipidemia LDL goal <100   . Osteoarthritis of right knee   . Personal history of chemotherapy 1992  . PONV (postoperative nausea and vomiting)    and "gets weak and light-leaded"  . Sensation of pressure in bladder area   . Vaginal vault prolapse    anterior  . Wears glasses   . Wears hearing  aid    left only   Past Surgical History:  Procedure Laterality Date  . BREAST BIOPSY Right 04/19/2018  . BREAST LUMPECTOMY Right 05/17/2018  . BREAST LUMPECTOMY WITH RADIOACTIVE SEED LOCALIZATION Right 05/17/2018   Procedure: RIGHT BREAST LUMPECTOMY WITH RADIOACTIVE SEED LOCALIZATION;  Surgeon: [name], MD;  Location: [facility];  Service: General;  Laterality: Right;  . CATARACT EXTRACTION W/ INTRAOCULAR LENS  IMPLANT, BILATERAL  2014  . CHOLECYSTECTOMY  1985  . COLONOSCOPY  last one 06-04-2013  . CYSTOCELE REPAIR N/A 02/29/2016   Procedure: ANTERIOR VAULT PROLAPSE REPAIR COLOPLAST [place] FIXATION AUGMENTED AXIS DERMIS REPAIR  ;  Surgeon: Carolan Clines, MD;  Location: [facility];  Service: Urology;  Laterality: N/A;  . ESOPHAGOGASTRODUODENOSCOPY  08-31-2009  . LAPAROSCOPIC ASSISTED VAGINAL HYSTERECTOMY  01-10-2000   w/ Bilateral Salpingoophorectomy /  Anterior & Posterior repair/  Pubovaginal Sling  . MASTECTOMY Left 1992   Family History  Problem Relation Age of Onset  . Heart attack Mother   . Colon cancer Mother 23  . Hypertension Mother   . Heart disease Father   . Breast cancer Sister        pat half sister dx in her 32s  . Pancreatic cancer Other 40   Social History   Socioeconomic History  . Marital status: Widowed    Spouse name: Not on file  . Number of children: 4  . Years of education: Not on file  . Highest education level: Not on file  Occupational History  . Occupation: Retired Product manager: RETIRED  Social Needs  . Financial resource strain: Not hard at all  . Food insecurity    Worry: Never true    Inability: Never true  . Transportation needs    Medical: No    Non-medical: No  Tobacco Use  . Smoking status: Former Smoker    Years: 13.00    Types: Cigarettes    Quit date: 10/12/1983    Years since quitting: 35.5  . Smokeless tobacco: Never Used  Substance and Sexual Activity  . Alcohol use: No  . Drug use: No   . Sexual activity: Not on file  Lifestyle  . Physical activity    Days per week: 6 days    Minutes per session: 30 min  . Stress: Only a little  Relationships  . Social connections    Talks on phone: More than three times a week    Gets together: More than three times a week    Attends religious service: More than 4 times per year    Active member of club or organization: No    Attends meetings of clubs or organizations: Never    Relationship status: Widowed  Other Topics Concern  . Not on file  Social History Narrative  . Not on file    Outpatient Encounter Medications as of 05/03/2019  Medication Sig  . acetaminophen (TYLENOL) 500  MG tablet Take 1,000 mg by mouth every morning.   Marland Kitchen anastrozole (ARIMIDEX) 1 MG tablet TAKE 1 TABLET(1 MG) BY MOUTH DAILY  . Black Elderberry 50 MG/5ML SYRP Take 5 mLs by mouth every Monday, Wednesday, and Friday.  . Calcium Carbonate-Vit D-Min (CALCIUM 600+D3 PLUS MINERALS) 600-800 MG-UNIT TABS Take 2 tablets by mouth daily.  Marland Kitchen loratadine (CLARITIN) 10 MG tablet Take 10 mg by mouth daily.  . metoprolol tartrate (LOPRESSOR) 25 MG tablet TAKE 1 TABLET(25 MG) BY MOUTH TWICE DAILY  . Multiple Vitamin (MULTIVITAMIN) tablet Take 1 tablet by mouth daily.  . Omega-3 Fatty Acids (FISH OIL) 1000 MG CAPS Take 1 capsule (1,000 mg total) by mouth daily.  Marland Kitchen omeprazole (PRILOSEC) 40 MG capsule TAKE 1 CAPSULE BY MOUTH TWICE A DAY  . Probiotic Product (PROBIOTIC FORMULA PO) Take 1 tablet by mouth every evening.   . rosuvastatin (CRESTOR) 5 MG tablet Take 1 tablet (5 mg total) by mouth 4 (four) times a week.   No facility-administered encounter medications on file as of 05/03/2019.     Activities of Daily Living In your present state of health, do you have any difficulty performing the following activities: 05/03/2019 05/10/2018  Hearing? N Y  Vision? N N  Difficulty concentrating or making decisions? Y N  Comment forgets sometimes -  Walking or climbing stairs? Y  N  Comment back pain -  Dressing or bathing? N N  Doing errands, shopping? N N  Preparing Food and eating ? N -  Using the Toilet? N -  In the past six months, have you accidently leaked urine? Y -  Comment uses pessry changed by Gyn every 2 months -  Do you have problems with loss of bowel control? N -  Managing your Medications? N -  Managing your Finances? N -  Housekeeping or managing your Housekeeping? N -  Some recent data might be hidden    Patient Care Team: [name], DO as PCP - General (Geriatric Medicine) [name], MD as PCP - Cardiology (Cardiology) [name], MD as Consulting Physician (General Surgery) [name], MD as Consulting Physician (Hematology and Oncology) [name], MD as Consulting Physician (Radiation Oncology) Delice Bison Charlestine Massed, NP as Nurse Practitioner (Hematology and Oncology)    Assessment:   This is a routine wellness examination for Afra.  Exercise Activities and Dietary recommendations Current Exercise Habits: Structured exercise class, Type of exercise: walking, Time (Minutes): 20, Frequency (Times/Week): 7, Weekly Exercise (Minutes/Week): 140, Intensity: Mild, Exercise limited by: None identified  Goals    . Increase water intake     Patient will increase intake.       Fall Risk Fall Risk  05/03/2019 04/30/2018 10/23/2017 04/03/2017 09/29/2016  Falls in the past year? 0 No No No No  Number falls in past yr: 0 - - - -  Injury with Fall? 0 - - - -   Is the patient's home free of loose throw rugs in walkways, pet beds, electrical cords, etc?   no      Grab bars in the bathroom? yes      Handrails on the stairs?   yes      Adequate lighting?   yes  Depression Screen PHQ 2/9 Scores 05/03/2019 04/30/2018 10/23/2017 04/03/2017  PHQ - 2 Score 0 0 0 0     Cognitive Function MMSE - Mini Mental State Exam 04/30/2018 04/03/2017 01/28/2016  Orientation to time 5 5 5   Orientation to Place 5 5  5  Registration 3 3 3    Attention/ Calculation 5 5 4   Recall 2 3 3   Language- name 2 objects 2 2 2   Language- repeat 1 1 1   Language- follow 3 step command 3 3 3   Language- read & follow direction 1 1 1   Write a sentence 1 1 1   Copy design 1 1 1   Total score 29 30 29      6CIT Screen 05/03/2019  What Year? 0 points  What month? 0 points  What time? 0 points  Count back from 20 0 points  Months in reverse 0 points  Repeat phrase 0 points  Total Score 0    Immunization History  Administered Date(s) Administered  . Fluad Quad(high Dose 65+) 05/02/2019  . Influenza, High Dose Seasonal PF 04/03/2017, 04/30/2018  . Influenza, Quadrivalent, Recombinant, Inj, Pf 03/25/2016  . Influenza,inj,Quad PF,6+ Mos 04/11/2013  . Influenza-Unspecified 08/09/2011, 05/30/2014  . Pneumococcal Conjugate-13 07/11/2013  . Pneumococcal Polysaccharide-23 07/25/2014  . Tdap 01/26/2015  . Zoster 08/08/2010  . Zoster Recombinat (Shingrix) 04/28/2017, 10/20/2017    Qualifies for Shingles Vaccine? Up to date   Screening Tests Health Maintenance  Topic Date Due  . TETANUS/TDAP  01/25/2025  . INFLUENZA VACCINE  Completed  . DEXA SCAN  Completed  . PNA vac Low Risk Adult  Completed    Cancer Screenings: Lung: Low Dose CT Chest recommended if Age 76-80 years, 30 pack-year currently smoking OR have quit w/in 15years. Patient does not qualify. Breast:  Up to date on Mammogram? Yes   Up to date of Bone Density/Dexa? Yes Colorectal: Aged out   Additional Screenings: Hepatitis C Screening: Low Risk   Plan:   I have personally reviewed and noted the following in the patient's chart:   . Medical and social history . Use of alcohol, tobacco or illicit drugs  . Current medications and supplements . Functional ability and status . Nutritional status . Physical activity . Advanced directives . List of other physicians . Hospitalizations, surgeries, and ER visits in previous 12 months . Vitals . Screenings to include  cognitive, depression, and falls . Referrals and appointments  In addition, I have reviewed and discussed with patient certain preventive protocols, quality metrics, and best practice recommendations. A written personalized care plan for preventive services as well as general preventive health recommendations were provided to patient.   Sandrea Hughs, NP  05/03/2019

## 2019-05-03 NOTE — Patient Instructions (Signed)
Rebecca Guzman , Thank you for taking time to come for your Medicare Wellness Visit. I appreciate your ongoing commitment to your health goals. Please review the following plan we discussed and let me know if I can assist you in the future.   Screening recommendations/referrals: Colonoscopy: Mammogram: Up to date  Bone Density: Up to date  Recommended yearly ophthalmology/optometry visit for glaucoma screening and checkup Recommended yearly dental visit for hygiene and checkup  Vaccinations: Influenza vaccine : Up to date  Pneumococcal vaccine: Up to date  Tdap vaccine: Due 01/25/2025  Shingles vaccine : Up to date    Advanced directives: yes   Conditions/risks identified: Advance age female > 36 yrs,Hyperlipidemia,Hx of smoking   Next appointment: 1 year   Preventive Care 62 Years and Older, Female Preventive care refers to lifestyle choices and visits with your health care provider that can promote health and wellness. What does preventive care include?  A yearly physical exam. This is also called an annual well check.  Dental exams once or twice a year.  Routine eye exams. Ask your health care provider how often you should have your eyes checked.  Personal lifestyle choices, including:  Daily care of your teeth and gums.  Regular physical activity.  Eating a healthy diet.  Avoiding tobacco and drug use.  Limiting alcohol use.  Practicing safe sex.  Taking low-dose aspirin every day.  Taking vitamin and mineral supplements as recommended by your health care provider. What happens during an annual well check? The services and screenings done by your health care provider during your annual well check will depend on your age, overall health, lifestyle risk factors, and family history of disease. Counseling  Your health care provider may ask you questions about your:  Alcohol use.  Tobacco use.  Drug use.  Emotional well-being.  Home and relationship well-being.   Sexual activity.  Eating habits.  History of falls.  Memory and ability to understand (cognition).  Work and work Statistician.  Reproductive health. Screening  You may have the following tests or measurements:  Height, weight, and BMI.  Blood pressure.  Lipid and cholesterol levels. These may be checked every 5 years, or more frequently if you are over 22 years old.  Skin check.  Lung cancer screening. You may have this screening every year starting at age 95 if you have a 30-pack-year history of smoking and currently smoke or have quit within the past 15 years.  Fecal occult blood test (FOBT) of the stool. You may have this test every year starting at age 20.  Flexible sigmoidoscopy or colonoscopy. You may have a sigmoidoscopy every 5 years or a colonoscopy every 10 years starting at age 64.  Hepatitis C blood test.  Hepatitis B blood test.  Sexually transmitted disease (STD) testing.  Diabetes screening. This is done by checking your blood sugar (glucose) after you have not eaten for a while (fasting). You may have this done every 1-3 years.  Bone density scan. This is done to screen for osteoporosis. You may have this done starting at age 36.  Mammogram. This may be done every 1-2 years. Talk to your health care provider about how often you should have regular mammograms. Talk with your health care provider about your test results, treatment options, and if necessary, the need for more tests. Vaccines  Your health care provider may recommend certain vaccines, such as:  Influenza vaccine. This is recommended every year.  Tetanus, diphtheria, and acellular pertussis (Tdap, Td)  vaccine. You may need a Td booster every 10 years.  Zoster vaccine. You may need this after age 79.  Pneumococcal 13-valent conjugate (PCV13) vaccine. One dose is recommended after age 76.  Pneumococcal polysaccharide (PPSV23) vaccine. One dose is recommended after age 4. Talk to your  health care provider about which screenings and vaccines you need and how often you need them. This information is not intended to replace advice given to you by your health care provider. Make sure you discuss any questions you have with your health care provider. Document Released: 08/21/2015 Document Revised: 04/13/2016 Document Reviewed: 05/26/2015 Elsevier Interactive Patient Education  2017 [place] Prevention in the Home Falls can cause injuries. They can happen to people of all ages. There are many things you can do to make your home safe and to help prevent falls. What can I do on the outside of my home?  Regularly fix the edges of walkways and driveways and fix any cracks.  Remove anything that might make you trip as you walk through a door, such as a raised step or threshold.  Trim any bushes or trees on the path to your home.  Use bright outdoor lighting.  Clear any walking paths of anything that might make someone trip, such as rocks or tools.  Regularly check to see if handrails are loose or broken. Make sure that both sides of any steps have handrails.  Any raised decks and porches should have guardrails on the edges.  Have any leaves, snow, or ice cleared regularly.  Use sand or salt on walking paths during winter.  Clean up any spills in your garage right away. This includes oil or grease spills. What can I do in the bathroom?  Use night lights.  Install grab bars by the toilet and in the tub and shower. Do not use towel bars as grab bars.  Use non-skid mats or decals in the tub or shower.  If you need to sit down in the shower, use a plastic, non-slip stool.  Keep the floor dry. Clean up any water that spills on the floor as soon as it happens.  Remove soap buildup in the tub or shower regularly.  Attach bath mats securely with double-sided non-slip rug tape.  Do not have throw rugs and other things on the floor that can make you trip. What  can I do in the bedroom?  Use night lights.  Make sure that you have a light by your bed that is easy to reach.  Do not use any sheets or blankets that are too big for your bed. They should not hang down onto the floor.  Have a firm chair that has side arms. You can use this for support while you get dressed.  Do not have throw rugs and other things on the floor that can make you trip. What can I do in the kitchen?  Clean up any spills right away.  Avoid walking on wet floors.  Keep items that you use a lot in easy-to-reach places.  If you need to reach something above you, use a strong step stool that has a grab bar.  Keep electrical cords out of the way.  Do not use floor polish or wax that makes floors slippery. If you must use wax, use non-skid floor wax.  Do not have throw rugs and other things on the floor that can make you trip. What can I do with my stairs?  Do not leave  any items on the stairs.  Make sure that there are handrails on both sides of the stairs and use them. Fix handrails that are broken or loose. Make sure that handrails are as long as the stairways.  Check any carpeting to make sure that it is firmly attached to the stairs. Fix any carpet that is loose or worn.  Avoid having throw rugs at the top or bottom of the stairs. If you do have throw rugs, attach them to the floor with carpet tape.  Make sure that you have a light switch at the top of the stairs and the bottom of the stairs. If you do not have them, ask someone to add them for you. What else can I do to help prevent falls?  Wear shoes that:  Do not have high heels.  Have rubber bottoms.  Are comfortable and fit you well.  Are closed at the toe. Do not wear sandals.  If you use a stepladder:  Make sure that it is fully opened. Do not climb a closed stepladder.  Make sure that both sides of the stepladder are locked into place.  Ask someone to hold it for you, if possible.   Clearly mark and make sure that you can see:  Any grab bars or handrails.  First and last steps.  Where the edge of each step is.  Use tools that help you move around (mobility aids) if they are needed. These include:  Canes.  Walkers.  Scooters.  Crutches.  Turn on the lights when you go into a dark area. Replace any light bulbs as soon as they burn out.  Set up your furniture so you have a clear path. Avoid moving your furniture around.  If any of your floors are uneven, fix them.  If there are any pets around you, be aware of where they are.  Review your medicines with your doctor. Some medicines can make you feel dizzy. This can increase your chance of falling. Ask your doctor what other things that you can do to help prevent falls. This information is not intended to replace advice given to you by your health care provider. Make sure you discuss any questions you have with your health care provider. Document Released: 05/21/2009 Document Revised: 12/31/2015 Document Reviewed: 08/29/2014 Elsevier Interactive Patient Education  2017 [employer].

## 2019-05-03 NOTE — Progress Notes (Signed)
    This service is provided via telemedicine  No vital signs collected/recorded due to the encounter was a telemedicine visit.   Location of patient (ex: home, work):  Home  Patient consents to a telephone visit:  Yes  Location of the provider (ex: office, home):  [employer], Office   Name of any referring provider:  [name], DO  Names of all persons participating in the telemedicine service and their role in the encounter: Dinah Ngetich, NP, Chrae Beatty/CMA, and Patient  Time spent on call: 6 min with medical assistant

## 2019-05-09 ENCOUNTER — Telehealth: Payer: Self-pay

## 2019-05-09 NOTE — Telephone Encounter (Signed)
RN spoke with patient.  Patient concerned with being billed for a diagnostic mammogram vs screening mammogram.    RN explained difference between dx and screening, and reasons indicating why diagnostic was ordered.    Pt voiced understanding.

## 2019-05-16 ENCOUNTER — Ambulatory Visit: Payer: Self-pay

## 2019-05-16 ENCOUNTER — Encounter: Payer: Self-pay | Admitting: Physical Medicine and Rehabilitation

## 2019-05-16 ENCOUNTER — Other Ambulatory Visit: Payer: Self-pay

## 2019-05-16 ENCOUNTER — Ambulatory Visit (INDEPENDENT_AMBULATORY_CARE_PROVIDER_SITE_OTHER): Payer: Medicare Other | Admitting: Physical Medicine and Rehabilitation

## 2019-05-16 VITALS — BP 138/77 | HR 75

## 2019-05-16 DIAGNOSIS — M5416 Radiculopathy, lumbar region: Secondary | ICD-10-CM

## 2019-05-16 DIAGNOSIS — M48062 Spinal stenosis, lumbar region with neurogenic claudication: Secondary | ICD-10-CM

## 2019-05-16 MED ORDER — BETAMETHASONE SOD PHOS & ACET 6 (3-3) MG/ML IJ SUSP
12.0000 mg | Freq: Once | INTRAMUSCULAR | Status: AC
  Administered 2019-05-16: 12 mg

## 2019-05-16 NOTE — Progress Notes (Signed)
   Numeric Pain Rating Scale and Functional Assessment Average Pain 10   In the last MONTH (on 0-10 scale) has pain interfered with the following?  1. General activity like being  able to carry out your everyday physical activities such as walking, climbing stairs, carrying groceries, or moving a chair?  Rating(8)   +Driver, -BT, -Dye Allergies.  

## 2019-05-20 ENCOUNTER — Other Ambulatory Visit: Payer: Self-pay | Admitting: *Deleted

## 2019-05-20 DIAGNOSIS — R079 Chest pain, unspecified: Secondary | ICD-10-CM

## 2019-05-20 DIAGNOSIS — K219 Gastro-esophageal reflux disease without esophagitis: Secondary | ICD-10-CM

## 2019-05-20 DIAGNOSIS — R1013 Epigastric pain: Secondary | ICD-10-CM

## 2019-05-20 MED ORDER — OMEPRAZOLE 40 MG PO CPDR: 40.0000 mg | DELAYED_RELEASE_CAPSULE | Freq: Two times a day (BID) | ORAL | 1 refills | Status: DC

## 2019-05-20 NOTE — Telephone Encounter (Signed)
[address]

## 2019-06-03 ENCOUNTER — Ambulatory Visit: Payer: Medicare Other | Admitting: Cardiology

## 2019-06-03 ENCOUNTER — Encounter: Payer: Self-pay | Admitting: Cardiology

## 2019-06-03 ENCOUNTER — Other Ambulatory Visit: Payer: Self-pay

## 2019-06-03 VITALS — BP 132/78 | HR 78 | Ht 65.0 in | Wt 171.0 lb

## 2019-06-03 DIAGNOSIS — E782 Mixed hyperlipidemia: Secondary | ICD-10-CM | POA: Diagnosis not present

## 2019-06-03 DIAGNOSIS — I10 Essential (primary) hypertension: Secondary | ICD-10-CM

## 2019-06-03 DIAGNOSIS — I491 Atrial premature depolarization: Secondary | ICD-10-CM

## 2019-06-03 DIAGNOSIS — R002 Palpitations: Secondary | ICD-10-CM

## 2019-06-03 NOTE — Progress Notes (Signed)
HEMATOLOGY-ONCOLOGY TELEPHONE VISIT PROGRESS NOTE  I connected with Rebecca Guzman on 06/04/2019 at  9:30 AM EDT by telephone and verified that I am speaking with the correct person using two identifiers.  I discussed the limitations, risks, security and privacy concerns of performing an evaluation and management service by telephone and the availability of in person appointments.  I also discussed with the patient that there may be a patient responsible charge related to this service. The patient expressed understanding and agreed to proceed.   History of Present Illness: Rebecca Guzman is a 85 y.o. female with above-mentioned history of right breast cancer who underwent a lumpectomy and is currently on anti-estrogen therapy with anastrozole after she could not tolerate letrozole. She presents over the phone today for follow-up.  Fatigue related to anastrozole. C/O vaginal dryness. Occ Hot flashes. Joint pains same.  Oncology History Overview Note  PMS2 VUS on Multi-cancer panel.   Malignant neoplasm of upper-inner quadrant of right breast in female, estrogen receptor positive ([place])  1992 Miscellaneous   Left breast cancer treated with mastectomy followed by adjuvant chemotherapy and 5 years of tamoxifen   04/17/2018 Initial Diagnosis   Screening detected right breast mass, by ultrasound measured 5 mm UIQ middle depth, biopsy revealed grade 1 IDC with DCIS, ER 100%, PR 100%, HER-2 -1+ by IHC, T1 a N0 stage I a clinical stage   05/17/2018 Surgery   Right lumpectomy: IDC, 5.5 mm, grade 1, margins negative, ER 100%, PR 100%, HER-2 1+ by IHC, Ki-67 less than 1%, T1BNX stage Ia   05/28/2018 -  Anti-estrogen oral therapy   Letrozole daily discontinued 03/04/19, switched to anastrozole 03/23/2019    05/30/2018 Cancer Staging   Staging form: Breast, AJCC 8th Edition - Pathologic: Stage IA (pT1b, pN0, cM0, G1, ER+, PR+, HER2-) - Signed by Gardenia Phlegm, NP on 05/30/2018   06/05/2018  Genetic Testing   PMS2 c.2559C>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  The Multi-Gene Panel offered by [facility] includes sequencing and/or deletion duplication testing of the following 84 genes: AIP, ALK, APC, ATM, AXIN2,BAP1,  BARD1, BLM, BMPR1A, BRCA1, BRCA2, BRIP1, CASR, CDC73, CDH1, CDK4, CDKN1B, CDKN1C, CDKN2A (p14ARF), CDKN2A (p16INK4a), CEBPA, CHEK2, CTNNA1, DICER1, DIS3L2, EGFR (c.2369C>T, p.Thr790Met variant only), EPCAM (Deletion/duplication testing only), FH, FLCN, GATA2, GPC3, GREM1 (Promoter region deletion/duplication testing only), HOXB13 (c.251G>A, p.Gly84Glu), HRAS, KIT, MAX, MEN1, MET, MITF (c.952G>A, p.Glu318Lys variant only), MLH1, MSH2, MSH3, MSH6, MUTYH, NBN, NF1, NF2, NTHL1, PALB2, PDGFRA, PHOX2B, PMS2, POLD1, POLE, POT1, PRKAR1A, PTCH1, PTEN, RAD50, RAD51C, RAD51D, RB1, RECQL4, RET, RUNX1, SDHAF2, SDHA (sequence changes only), SDHB, SDHC, SDHD, SMAD4, SMARCA4, SMARCB1, SMARCE1, STK11, SUFU, TERC, TERT, TMEM127, TP53, TSC1, TSC2, VHL, WRN and WT1.  The report date is 06/05/2018.     Observations/Objective:  Continues to suffer from antiestrogen toxicities including joint pains, vaginal dryness and occasional hot flashes and feelings of fatigue.   Assessment Plan:  Malignant neoplasm of upper-inner quadrant of right breast in female, estrogen receptor positive ([place]) 05/17/2018:Right lumpectomy: IDC, 5.5 mm, grade 1, margins negative, ER 100%, PR 100%, HER-2 1+ by IHC, Ki-67 less than 1%, T1BNX stage Ia We did not recommend adjuvant radiation therapy because of her favorable profile and her age.  Current treatment: Letrozole 2.5 mg daily started 05/28/2018, switched to anastrozole 03/23/2019 (due to arthralgias and fatigue)  Anastrozole toxicities:  Continued problems same as letrozole.  Although they are slightly better she would like to switch to tamoxifen. Patient had previously taken tamoxifen and had  tolerated it well.  We discussed risks and benefits.   She had a prior hysterectomy so there is no concern for uterine problems.  Breast cancer surveillance: Mammogram 04/09/2019: No evidence of malignancy breast density category C   Return to clinic in 3 months for follow-up.    I discussed the assessment and treatment plan with the patient. The patient was provided an opportunity to ask questions and all were answered. The patient agreed with the plan and demonstrated an understanding of the instructions. The patient was advised to call back or seek an in-person evaluation if the symptoms worsen or if the condition fails to improve as anticipated.   I provided 15 minutes of non-face-to-face time during this encounter.   Rulon Eisenmenger, MD 06/04/2019    I, Molly Dorshimer, am acting as scribe for [name], MD.  I have reviewed the above documentation for accuracy and completeness, and I agree with the above.

## 2019-06-03 NOTE — Patient Instructions (Signed)
Medication Instructions:   Your physician recommends that you continue on your current medications as directed. Please refer to the Current Medication list given to you today.  *If you need a refill on your cardiac medications before your next appointment, please call your pharmacy*   Follow-Up: At [facility], you and your health needs are our priority.  As part of our continuing mission to provide you with exceptional heart care, we have created designated Provider Care Teams.  These Care Teams include your primary Cardiologist (physician) and Advanced Practice Providers (APPs -  Physician Assistants and Nurse Practitioners) who all work together to provide you with the care you need, when you need it.  Your next appointment:   6 months  The format for your next appointment:   In Person  Provider:   Ena Dawley, MD  Other Instructions

## 2019-06-03 NOTE — Progress Notes (Signed)
Cardiology Office Note    Date:  06/03/2019   ID:  Rebecca Guzman, DOB Apr 28, 1934, MRN [id number]  PCP:  [name], DO  Cardiologist:  Ena Dawley, MD   Referring physician: [name], DO   History of Present Illness:  Rebecca Guzman is a 85 y.o. female who is a very pleasant patient coming with concern of palpitations. She states that she has been experiencing palpitations with increased frequency currently every day especially at rest and at night. They would last a few seconds and are not associated with shortness of breath chest pain dizziness or syncope other very uncomfortable and she is concerned. Her family history includes early coronary artery disease in her father died at age of 68 secondary to heart attack.  12/29/2016 - patient underwent Holter monitoring that showed frequent PACs (700 in 34 hrs) and few very short runs of atrial tachycardia with the longest lasting 4 beats. She was started on metoprolol 12.5 mg by mouth twice a day with significant improvement of symptoms. She also had an echocardiogram performed that showed LVEF 60-65% and grade 1 diastolic dysfunction, trivial MR, normal left atrial size and mild TR.  10/08/2018 -this is 6 months follow-up, since the last visit the patient was diagnosed with recurrent breast cancer, this type was different than in 9092 when she was originally diagnosed, she was a stage I and underwent a lumpectomy and did not require any chemo.  She has been experiencing a lot of generalized muscle and joint pain and she attributes it to pravastatin and omeprazole.  She has occasional palpitations but only last few seconds.  No chest pain or shortness of breath.  06/03/2019, patient is coming after 6 months, she is doing well from cardiac standpoint and continues to walk, she denies any chest pain shortness of breath dizziness or syncope.  No lower extremity edema.  Her only complaint right now are side effects from anastrozole and  wants to discuss it tomorrow with Dr. Sonny Dandy who is her oncologist.  Past Medical History:  Diagnosis Date  . Asthma   . BPPV (benign paroxysmal positional vertigo)   . Compression fracture of L3 lumbar vertebra 04/06/2015  . Family history of breast cancer   . Family history of colon cancer   . Family history of pancreatic cancer   . GERD (gastroesophageal reflux disease)   . Hiatal hernia    pt states she doesnt have this  . History of adenomatous polyp of colon    tubular adenoma 2005 and  2014 tubular adenoma and  hyperplastic polyp  . History of breast cancer per pt no recurrence   dx 1992 --  s/p  left breast mastectomy and chemotherapy  . Hyperlipidemia LDL goal <100   . Osteoarthritis of right knee   . Personal history of chemotherapy 1992  . PONV (postoperative nausea and vomiting)    and "gets weak and light-leaded"  . Sensation of pressure in bladder area   . Vaginal vault prolapse    anterior  . Wears glasses   . Wears hearing aid    left only    Past Surgical History:  Procedure Laterality Date  . BREAST BIOPSY Right 04/19/2018  . BREAST LUMPECTOMY Right 05/17/2018  . BREAST LUMPECTOMY WITH RADIOACTIVE SEED LOCALIZATION Right 05/17/2018   Procedure: RIGHT BREAST LUMPECTOMY WITH RADIOACTIVE SEED LOCALIZATION;  Surgeon: [name], MD;  Location: [facility];  Service: General;  Laterality: Right;  . CATARACT EXTRACTION W/ INTRAOCULAR LENS  IMPLANT, BILATERAL  2014  . CHOLECYSTECTOMY  1985  . COLONOSCOPY  last one 06-04-2013  . CYSTOCELE REPAIR N/A 02/29/2016   Procedure: ANTERIOR VAULT PROLAPSE REPAIR COLOPLAST [place] FIXATION AUGMENTED AXIS DERMIS REPAIR  ;  Surgeon: Carolan Clines, MD;  Location: [facility];  Service: Urology;  Laterality: N/A;  . ESOPHAGOGASTRODUODENOSCOPY  08-31-2009  . LAPAROSCOPIC ASSISTED VAGINAL HYSTERECTOMY  01-10-2000   w/ Bilateral Salpingoophorectomy /  Anterior & Posterior repair/  Pubovaginal Sling  .  MASTECTOMY Left 1992    Current Medications: Outpatient Medications Prior to Visit  Medication Sig Dispense Refill  . acetaminophen (TYLENOL) 500 MG tablet Take 1,000 mg by mouth every morning.     Marland Kitchen anastrozole (ARIMIDEX) 1 MG tablet TAKE 1 TABLET(1 MG) BY MOUTH DAILY 30 tablet 1  . Black Elderberry 50 MG/5ML SYRP Take 5 mLs by mouth every Monday, Wednesday, and Friday.    . Calcium Carbonate-Vit D-Min (CALCIUM 600+D3 PLUS MINERALS) 600-800 MG-UNIT TABS Take 2 tablets by mouth daily. 180 tablet 3  . loratadine (CLARITIN) 10 MG tablet Take 10 mg by mouth daily.    . metoprolol tartrate (LOPRESSOR) 25 MG tablet TAKE 1 TABLET(25 MG) BY MOUTH TWICE DAILY 180 tablet 0  . Multiple Vitamin (MULTIVITAMIN) tablet Take 1 tablet by mouth daily.    . Omega-3 Fatty Acids (FISH OIL) 1000 MG CAPS Take 1 capsule (1,000 mg total) by mouth daily.  0  . omeprazole (PRILOSEC) 40 MG capsule Take 1 capsule (40 mg total) by mouth 2 (two) times daily. 180 capsule 1  . Probiotic Product (PROBIOTIC FORMULA PO) Take 1 tablet by mouth every evening.     . rosuvastatin (CRESTOR) 5 MG tablet Take 1 tablet (5 mg total) by mouth 4 (four) times a week. 48 tablet 3   Facility-Administered Medications Prior to Visit  Medication Dose Route Frequency Provider Last Rate Last Dose  . betamethasone acetate-betamethasone sodium phosphate (CELESTONE) injection 12 mg  12 mg Other Once Magnus Sinning, MD         Allergies:   Codeine and Morphine and related   Social History   Socioeconomic History  . Marital status: Widowed    Spouse name: Not on file  . Number of children: 4  . Years of education: Not on file  . Highest education level: Not on file  Occupational History  . Occupation: Retired Product manager: RETIRED  Social Needs  . Financial resource strain: Not hard at all  . Food insecurity    Worry: Never true    Inability: Never true  . Transportation needs    Medical: No    Non-medical: No  Tobacco  Use  . Smoking status: Former Smoker    Years: 13.00    Types: Cigarettes    Quit date: 10/12/1983    Years since quitting: 35.6  . Smokeless tobacco: Never Used  Substance and Sexual Activity  . Alcohol use: No  . Drug use: No  . Sexual activity: Not on file  Lifestyle  . Physical activity    Days per week: 6 days    Minutes per session: 30 min  . Stress: Only a little  Relationships  . Social connections    Talks on phone: More than three times a week    Gets together: More than three times a week    Attends religious service: More than 4 times per year    Active member of club or organization: No  Attends meetings of clubs or organizations: Never    Relationship status: Widowed  Other Topics Concern  . Not on file  Social History Narrative  . Not on file     Family History:  The patient's family history includes Breast cancer in her sister; Colon cancer (age of onset: 72) in her mother; Heart attack in her mother; Heart disease in her father; Hypertension in her mother; Pancreatic cancer (age of onset: 59) in an other family member.   ROS:   Please see the history of present illness.    ROS All other systems reviewed and are negative.  PHYSICAL EXAM:   VS:  BP 132/78   Pulse 78   Ht 5\' 5"  (1.651 m)   Wt 171 lb (77.6 kg)   LMP 11/21/1985   BMI 28.46 kg/m    GEN: Well nourished, well developed, in no acute distress  HEENT: normal  Neck: no JVD, carotid bruits, or masses Cardiac: RRR; no murmurs, rubs, or gallops,no edema, Poor pulses in the left lower extremity.  Respiratory:  clear to auscultation bilaterally, normal work of breathing GI: soft, nontender, nondistended, + BS MS: no deformity or atrophy  Skin: warm and dry, no rash Neuro:  Alert and Oriented x 3, Strength and sensation are intact Psych: euthymic mood, full affect  Wt Readings from Last 3 Encounters:  06/03/19 171 lb (77.6 kg)  05/02/19 175 lb 6.4 oz (79.6 kg)  03/04/19 174 lb 3.2 oz (79 kg)     Studies/Labs Reviewed:   EKG:  EKG is ordered today.  The ekg ordered today demonstrates Normal sinus rhythm, normal EKG. Personally reviewed.  Recent Labs: 04/22/2019: ALT 14; BUN 16; Creat 0.78; Hemoglobin 13.2; Platelets 216; Potassium 4.3; Sodium 142   Lipid Panel    Component Value Date/Time   CHOL 192 04/22/2019 0803   CHOL 187 01/26/2016 0802   TRIG 147 04/22/2019 0803   HDL 59 04/22/2019 0803   HDL 59 01/26/2016 0802   CHOLHDL 3.3 04/22/2019 0803   VLDL 33 (H) 02/13/2017 0832   LDLCALC 107 (H) 04/22/2019 0803    Additional studies/ records that were reviewed today include:   TTE: 10/2016 - Left ventricle: The cavity size was normal. Wall thickness was   increased in a pattern of mild LVH. Systolic function was normal.   The estimated ejection fraction was in the range of 60% to 65%.   Wall motion was normal; there were no regional wall motion   abnormalities. Doppler parameters are consistent with abnormal   left ventricular relaxation (grade 1 diastolic dysfunction). The   E/e&' ratio is >15, suggesting elevated LV filling pressure. - Mitral valve: Mildly thickened and sclerotic anterior leaflet.   There was trivial regurgitation. - Left atrium: The atrium was normal in size. - Tricuspid valve: There was mild regurgitation. - Pulmonary arteries: PA peak pressure: 37 mm Hg (S). - Inferior vena cava: The vessel was normal in size. The   respirophasic diameter changes were in the normal range (>= 50%),   consistent with normal central venous pressure.  Impressions:  - LVEF 60-65%, mild LVH, normal wall motion, grade 1 DD with   elevated LV filling pressure, trivial MR, normal LA size, mild   TR, RVSP 37 mmHg, normal IVC.  EKG was not performed today, I have reviewed 1 from May 02, 2019 includes normal and unchanged from prior.    ASSESSMENT:    1. Palpitations   2. Mixed hyperlipidemia   3. PAC (premature  atrial contraction)   4. Essential  hypertension    PLAN:  In order of problems listed above:  1. Palpitations, Normal echocardiogram, normal left atrial size, normal labs including electrolytes and TSH. Holter monitor  showed 700 PACs in 36 hours and few very short runs of atrial tachycardia, significant symptoms improvement with metoprolol, this was recently increased to 25 mg twice daily.  We will continue same management.  She has minimal symptoms.  2. Blood pressure is well controlled  3. Hyperlipidemia -currently on rosuvastatin 5 mg 4 times a week and she tolerates it well.   4. Recurrent breast cancer -currently on anastrozole that she does not tolerate where and wants to switch back to tamoxifen.  Follow-up in 6 months.  Medication Adjustments/Labs and Tests Ordered: Current medicines are reviewed at length with the patient today.  Concerns regarding medicines are outlined above.  Medication changes, Labs and Tests ordered today are listed in the Patient Instructions below. Patient Instructions  Medication Instructions:   Your physician recommends that you continue on your current medications as directed. Please refer to the Current Medication list given to you today.  *If you need a refill on your cardiac medications before your next appointment, please call your pharmacy*   Follow-Up: At [facility], you and your health needs are our priority.  As part of our continuing mission to provide you with exceptional heart care, we have created designated Provider Care Teams.  These Care Teams include your primary Cardiologist (physician) and Advanced Practice Providers (APPs -  Physician Assistants and Nurse Practitioners) who all work together to provide you with the care you need, when you need it.  Your next appointment:   6 months  The format for your next appointment:   In Person  Provider:   Ena Dawley, MD  Other Instructions      Signed, Ena Dawley, MD  06/03/2019 10:32 AM    [address], [place], [place]  [address] Phone: [phone]; Fax: [phone]

## 2019-06-04 ENCOUNTER — Inpatient Hospital Stay: Payer: Medicare Other | Attending: Hematology and Oncology | Admitting: Hematology and Oncology

## 2019-06-04 DIAGNOSIS — Z170 Estrogen receptor positive status [ER+]: Secondary | ICD-10-CM | POA: Diagnosis not present

## 2019-06-04 DIAGNOSIS — C50211 Malignant neoplasm of upper-inner quadrant of right female breast: Secondary | ICD-10-CM

## 2019-06-04 MED ORDER — TAMOXIFEN CITRATE 20 MG PO TABS: 20.0000 mg | ORAL_TABLET | Freq: Every day | ORAL | 3 refills | Status: DC

## 2019-06-04 NOTE — Assessment & Plan Note (Signed)
05/17/2018:Right lumpectomy: IDC, 5.5 mm, grade 1, margins negative, ER 100%, PR 100%, HER-2 1+ by IHC, Ki-67 less than 1%, T1BNX stage Ia We did not recommend adjuvant radiation therapy because of her favorable profile and her age.  Current treatment: Letrozole 2.5 mg daily started 05/28/2018, switched to anastrozole 03/23/2019 (due to arthralgias and fatigue)  Anastrozole toxicities:   Breast cancer surveillance: Mammogram scheduled for 04/09/2019   Return to clinic in 6 months for follow-up.

## 2019-06-05 ENCOUNTER — Telehealth: Payer: Self-pay | Admitting: Hematology and Oncology

## 2019-06-05 NOTE — Telephone Encounter (Signed)
I talk with patient regarding schedule  

## 2019-06-25 NOTE — Progress Notes (Signed)
Rebecca Guzman - 85 y.o. female MRN [id number]  Date of birth: Jul 24, 1934  Office Visit Note: Visit Date: 05/16/2019 PCP: [name], DO Referred by: [name], DO  Subjective: Chief Complaint  Patient presents with  . Lower Back - Pain   HPI:  Rebecca Guzman is a 85 y.o. female who comes in today For planned repeat right L3 transforaminal epidural steroid injection.  Patient really good relief in September with relief for about a month and the symptoms returned.  We will repeat today and see how she does.  ROS Otherwise per HPI.  Assessment & Plan: Visit Diagnoses:  1. Lumbar radiculopathy   2. Spinal stenosis of lumbar region with neurogenic claudication     Plan: No additional findings.   Meds & Orders:  Meds ordered this encounter  Medications  . betamethasone acetate-betamethasone sodium phosphate (CELESTONE) injection 12 mg    Orders Placed This Encounter  Procedures  . XR C-ARM NO REPORT  . Epidural Steroid injection    Follow-up: Return if symptoms worsen or fail to improve.   Procedures: No procedures performed  Lumbosacral Transforaminal Epidural Steroid Injection - Sub-Pedicular Approach with Fluoroscopic Guidance  Patient: Rebecca Guzman      Date of Birth: 09/25/1933 MRN: [id number] PCP: [name], DO      Visit Date: 05/16/2019   Universal Protocol:    Date/Time: 05/16/2019  Consent Given By: the patient  Position: PRONE  Additional Comments: Vital signs were monitored before and after the procedure. Patient was prepped and draped in the usual sterile fashion. The correct patient, procedure, and site was verified.   Injection Procedure Details:  Procedure Site One Meds Administered:  Meds ordered this encounter  Medications  . betamethasone acetate-betamethasone sodium phosphate (CELESTONE) injection 12 mg    Laterality: Right  Location/Site:  L3-L4  Needle size: 22 G  Needle type: Spinal  Needle Placement:  Transforaminal  Findings:    -Comments: Excellent flow of contrast along the nerve and into the epidural space.  Procedure Details: After squaring off the end-plates to get a true AP view, the C-arm was positioned so that an oblique view of the foramen as noted above was visualized. The target area is just inferior to the "nose of the scotty dog" or sub pedicular. The soft tissues overlying this structure were infiltrated with 2-3 ml. of 1% Lidocaine without Epinephrine.  The spinal needle was inserted toward the target using a "trajectory" view along the fluoroscope beam.  Under AP and lateral visualization, the needle was advanced so it did not puncture dura and was located close the 6 O'Clock position of the pedical in AP tracterory. Biplanar projections were used to confirm position. Aspiration was confirmed to be negative for CSF and/or blood. A 1-2 ml. volume of Isovue-250 was injected and flow of contrast was noted at each level. Radiographs were obtained for documentation purposes.   After attaining the desired flow of contrast documented above, a 0.5 to 1.0 ml test dose of 0.25% Marcaine was injected into each respective transforaminal space.  The patient was observed for 90 seconds post injection.  After no sensory deficits were reported, and normal lower extremity motor function was noted,   the above injectate was administered so that equal amounts of the injectate were placed at each foramen (level) into the transforaminal epidural space.   Additional Comments:  The patient tolerated the procedure well Dressing: 2 x 2 sterile gauze and Band-Aid  Post-procedure details: Patient was observed during the procedure. Post-procedure instructions were reviewed.  Patient left the clinic in stable condition.    Clinical History: MRI LUMBAR SPINE WITHOUT CONTRAST  TECHNIQUE: Multiplanar, multisequence MR imaging of the lumbar spine was performed. No intravenous contrast was  administered.  COMPARISON:  Lumbar spine radiographs 04/06/2015  FINDINGS: Moderate lumbar levoscoliosis is again seen with apex at L2-3. There is trace retrolisthesis of L1 on L2 and L2 on L3 and trace anterolisthesis of L4 on L5. There is at most minimal right-sided vertebral body height loss at L3 which is likely related to the scoliosis. No frank compression fracture or marrow edema suggestive of recent osseous injury is identified.  There is minimal right-sided degenerative endplate edema at [id number] and L3-4 associated with asymmetric right-sided disc space height loss at these levels. There is also mild-to-moderate disc space narrowing at L1-2 and L5-S1. Disc desiccation is present throughout the lumbar spine.  The conus medullaris is normal in signal and terminates at L1. There is slight fullness of the renal collecting systems and ureters bilaterally, incompletely evaluated but may be physiologic given partially visualized mild bladder distention.  L1-2: Mild disc bulging and endplate spurring result in minimal right lateral recess narrowing without spinal canal or neural foraminal stenosis.  L2-3: Mild disc bulging, endplate spurring, and asymmetric right facet arthrosis result in minimal right lateral recess and minimal right neural foraminal stenosis without spinal stenosis.  L3-4: Disc bulging asymmetric to the right and moderate right and mild left facet arthrosis result in moderate right and mild left lateral recess stenosis and moderate to severe right neural foraminal stenosis without spinal stenosis. There is the potential for the right-sided L3 and/or L4 nerve root impingement.  L4-5: Listhesis with disc uncovering and advanced left greater than right facet arthrosis result in moderate spinal stenosis, mild right and moderate left lateral recess stenosis, and at most minimal bilateral neural foraminal stenosis.  L5-S1: Disc bulging, small left  paracentral disc extrusion with mild inferior migration in the lateral recess, and mild right and severe left facet arthrosis result in mild right and moderate to severe left lateral recess stenosis and mild left neural foraminal stenosis without spinal stenosis.  IMPRESSION: 1. Advanced L4-5 facet arthrosis with grade 1 anterolisthesis, moderate spinal stenosis, and left greater than right lateral recess stenosis. 2. Moderate right lateral recess stenosis and moderate to severe right neural foraminal stenosis at L3-4. 3. Moderate lumbar levoscoliosis.   Electronically Signed   By: Logan Bores M.D.   On: 10/15/2015 08:42     Objective:  VS:  HT:    WT:   BMI:     BP:138/77  HR:75bpm  TEMP: ( )  RESP:  Physical Exam  Ortho Exam Imaging: No results found.

## 2019-06-25 NOTE — Procedures (Signed)
Lumbosacral Transforaminal Epidural Steroid Injection - Sub-Pedicular Approach with Fluoroscopic Guidance  Patient: Rebecca Guzman      Date of Birth: [birth year] MRN: [id number] PCP: [name], DO      Visit Date: 05/16/2019   Universal Protocol:    Date/Time: 05/16/2019  Consent Given By: the patient  Position: PRONE  Additional Comments: Vital signs were monitored before and after the procedure. Patient was prepped and draped in the usual sterile fashion. The correct patient, procedure, and site was verified.   Injection Procedure Details:  Procedure Site One Meds Administered:  Meds ordered this encounter  Medications  . betamethasone acetate-betamethasone sodium phosphate (CELESTONE) injection 12 mg    Laterality: Right  Location/Site:  L3-L4  Needle size: 22 G  Needle type: Spinal  Needle Placement: Transforaminal  Findings:    -Comments: Excellent flow of contrast along the nerve and into the epidural space.  Procedure Details: After squaring off the end-plates to get a true AP view, the C-arm was positioned so that an oblique view of the foramen as noted above was visualized. The target area is just inferior to the "nose of the scotty dog" or sub pedicular. The soft tissues overlying this structure were infiltrated with 2-3 ml. of 1% Lidocaine without Epinephrine.  The spinal needle was inserted toward the target using a "trajectory" view along the fluoroscope beam.  Under AP and lateral visualization, the needle was advanced so it did not puncture dura and was located close the 6 O'Clock position of the pedical in AP tracterory. Biplanar projections were used to confirm position. Aspiration was confirmed to be negative for CSF and/or blood. A 1-2 ml. volume of Isovue-250 was injected and flow of contrast was noted at each level. Radiographs were obtained for documentation purposes.   After attaining the desired flow of contrast documented above, a 0.5 to  1.0 ml test dose of 0.25% Marcaine was injected into each respective transforaminal space.  The patient was observed for 90 seconds post injection.  After no sensory deficits were reported, and normal lower extremity motor function was noted,   the above injectate was administered so that equal amounts of the injectate were placed at each foramen (level) into the transforaminal epidural space.   Additional Comments:  The patient tolerated the procedure well Dressing: 2 x 2 sterile gauze and Band-Aid    Post-procedure details: Patient was observed during the procedure. Post-procedure instructions were reviewed.  Patient left the clinic in stable condition.

## 2019-06-27 ENCOUNTER — Other Ambulatory Visit: Payer: Self-pay

## 2019-06-27 ENCOUNTER — Ambulatory Visit (INDEPENDENT_AMBULATORY_CARE_PROVIDER_SITE_OTHER): Payer: Medicare Other

## 2019-06-27 DIAGNOSIS — M810 Age-related osteoporosis without current pathological fracture: Secondary | ICD-10-CM | POA: Diagnosis not present

## 2019-06-27 MED ORDER — DENOSUMAB 60 MG/ML ~~LOC~~ SOSY
60.0000 mg | PREFILLED_SYRINGE | Freq: Once | SUBCUTANEOUS | Status: AC
  Administered 2019-06-27: 60 mg via SUBCUTANEOUS

## 2019-07-22 ENCOUNTER — Other Ambulatory Visit: Payer: Self-pay | Admitting: Cardiology

## 2019-07-22 ENCOUNTER — Other Ambulatory Visit: Payer: Self-pay | Admitting: *Deleted

## 2019-07-22 DIAGNOSIS — E782 Mixed hyperlipidemia: Secondary | ICD-10-CM

## 2019-07-22 DIAGNOSIS — M810 Age-related osteoporosis without current pathological fracture: Secondary | ICD-10-CM

## 2019-07-22 DIAGNOSIS — E785 Hyperlipidemia, unspecified: Secondary | ICD-10-CM

## 2019-07-22 DIAGNOSIS — R002 Palpitations: Secondary | ICD-10-CM

## 2019-07-22 DIAGNOSIS — I493 Ventricular premature depolarization: Secondary | ICD-10-CM

## 2019-07-22 MED ORDER — CALCIUM 600+D3 PLUS MINERALS 600-800 MG-UNIT PO TABS: 2.0000 | ORAL_TABLET | Freq: Every day | ORAL | 3 refills | Status: DC

## 2019-07-22 NOTE — Telephone Encounter (Signed)
[facility] with [facility] faxed refill request Pended Rx and sent to Dr. Mariea Clonts for approval due to [place].

## 2019-07-25 ENCOUNTER — Other Ambulatory Visit: Payer: Self-pay | Admitting: *Deleted

## 2019-07-25 DIAGNOSIS — M810 Age-related osteoporosis without current pathological fracture: Secondary | ICD-10-CM

## 2019-07-25 MED ORDER — CALCIUM 600+D3 PLUS MINERALS 600-800 MG-UNIT PO TABS: 2.0000 | ORAL_TABLET | Freq: Every day | ORAL | 3 refills | Status: AC

## 2019-07-25 NOTE — Telephone Encounter (Signed)
Pharmacy did not received previous Rx. Refaxed Rx.

## 2019-08-22 ENCOUNTER — Ambulatory Visit: Payer: Medicare Other | Attending: Internal Medicine

## 2019-08-22 DIAGNOSIS — Z23 Encounter for immunization: Secondary | ICD-10-CM | POA: Insufficient documentation

## 2019-08-22 NOTE — Progress Notes (Signed)
   Covid-19 Vaccination Clinic  Name:  Rebecca Guzman    MRN: [id number] DOB: 27-Oct-1933  08/22/2019  Rebecca Guzman was observed post Covid-19 immunization for 30 minutes based on pre-vaccination screening without incidence. She was provided with Vaccine Information Sheet and instruction to access the V-Safe system.   Rebecca Guzman was instructed to call 911 with any severe reactions post vaccine: Marland Kitchen Difficulty breathing  . Swelling of your face and throat  . A fast heartbeat  . A bad rash all over your body  . Dizziness and weakness    Immunizations Administered    Name Date Dose VIS Date Route   Pfizer COVID-19 Vaccine 08/22/2019 10:17 AM 0.3 mL 07/19/2019 Intramuscular   Manufacturer: [employer]   Lot: [id number]   [place]: [id number]

## 2019-09-04 NOTE — Progress Notes (Signed)
Patient Care Team: [name], DO as PCP - General (Geriatric Medicine) [name], MD as PCP - Cardiology (Cardiology) [name], MD as Consulting Physician (General Surgery) [name], MD as Consulting Physician (Hematology and Oncology) [name], MD as Consulting Physician (Radiation Oncology) Delice Bison Charlestine Massed, NP as Nurse Practitioner (Hematology and Oncology)  DIAGNOSIS:    ICD-10-CM   1. Malignant neoplasm of upper-inner quadrant of right breast in female, estrogen receptor positive ([place])  C50.211    Z17.0     SUMMARY OF ONCOLOGIC HISTORY: Oncology History Overview Note  PMS2 VUS on Multi-cancer panel.   Malignant neoplasm of upper-inner quadrant of right breast in female, estrogen receptor positive ([place])  1992 Miscellaneous   Left breast cancer treated with mastectomy followed by adjuvant chemotherapy and 5 years of tamoxifen   04/17/2018 Initial Diagnosis   Screening detected right breast mass, by ultrasound measured 5 mm UIQ middle depth, biopsy revealed grade 1 IDC with DCIS, ER 100%, PR 100%, HER-2 -1+ by IHC, T1 a N0 stage I a clinical stage   05/17/2018 Surgery   Right lumpectomy: IDC, 5.5 mm, grade 1, margins negative, ER 100%, PR 100%, HER-2 1+ by IHC, Ki-67 less than 1%, T1BNX stage Ia   05/28/2018 -  Anti-estrogen oral therapy   Letrozole daily discontinued 03/04/19, switched to anastrozole 03/23/2019, switched to tamoxifen 06/04/19   05/30/2018 Cancer Staging   Staging form: Breast, AJCC 8th Edition - Pathologic: Stage IA (pT1b, pN0, cM0, G1, ER+, PR+, HER2-) - Signed by Gardenia Phlegm, NP on 05/30/2018   06/05/2018 Genetic Testing   PMS2 c.2559C>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  The Multi-Gene Panel offered by [facility] includes sequencing and/or deletion duplication testing of the following 84 genes: AIP, ALK, APC, ATM, AXIN2,BAP1,  BARD1, BLM, BMPR1A, BRCA1, BRCA2, BRIP1, CASR, CDC73, CDH1,  CDK4, CDKN1B, CDKN1C, CDKN2A (p14ARF), CDKN2A (p16INK4a), CEBPA, CHEK2, CTNNA1, DICER1, DIS3L2, EGFR (c.2369C>T, p.Thr790Met variant only), EPCAM (Deletion/duplication testing only), FH, FLCN, GATA2, GPC3, GREM1 (Promoter region deletion/duplication testing only), HOXB13 (c.251G>A, p.Gly84Glu), HRAS, KIT, MAX, MEN1, MET, MITF (c.952G>A, p.Glu318Lys variant only), MLH1, MSH2, MSH3, MSH6, MUTYH, NBN, NF1, NF2, NTHL1, PALB2, PDGFRA, PHOX2B, PMS2, POLD1, POLE, POT1, PRKAR1A, PTCH1, PTEN, RAD50, RAD51C, RAD51D, RB1, RECQL4, RET, RUNX1, SDHAF2, SDHA (sequence changes only), SDHB, SDHC, SDHD, SMAD4, SMARCA4, SMARCB1, SMARCE1, STK11, SUFU, TERC, TERT, TMEM127, TP53, TSC1, TSC2, VHL, WRN and WT1.  The report date is 06/05/2018.     CHIEF COMPLIANT: Follow-up of right breast cancer on tamoxifen  INTERVAL HISTORY: Rebecca Guzman is a 86 y.o. with above-mentioned history of right breast cancer who underwent a lumpectomy and is currently on anti-estrogen therapy with tamoxifen after she could not tolerate letrozole or anastrozole.She presents to the clinic todayfor follow-up.   She is tolerating tamoxifen much better.  She no longer has the profound fatigue.  She initially had slight loss of appetite but it had improved.  She denies any lumps or nodules or pain or discomfort in the breast.  ALLERGIES:  is allergic to codeine and morphine and related.  MEDICATIONS:  Current Outpatient Medications  Medication Sig Dispense Refill  . acetaminophen (TYLENOL) 500 MG tablet Take 1,000 mg by mouth every morning.     Renard Hamper Elderberry 50 MG/5ML SYRP Take 5 mLs by mouth every Monday, Wednesday, and Friday.    . Calcium Carbonate-Vit D-Min (CALCIUM 600+D3 PLUS MINERALS) 600-800 MG-UNIT TABS Take 2 tablets by mouth daily. 180 tablet 3  . loratadine (CLARITIN)  10 MG tablet Take 10 mg by mouth daily.    . metoprolol tartrate (LOPRESSOR) 25 MG tablet TAKE 1 TABLET(25 MG) BY MOUTH TWICE DAILY 180 tablet 3  . Multiple  Vitamin (MULTIVITAMIN) tablet Take 1 tablet by mouth daily.    . Omega-3 Fatty Acids (FISH OIL) 1000 MG CAPS Take 1 capsule (1,000 mg total) by mouth daily.  0  . omeprazole (PRILOSEC) 40 MG capsule Take 1 capsule (40 mg total) by mouth 2 (two) times daily. 180 capsule 1  . Probiotic Product (PROBIOTIC FORMULA PO) Take 1 tablet by mouth every evening.     . rosuvastatin (CRESTOR) 5 MG tablet Take 1 tablet (5 mg total) by mouth 4 (four) times a week. 48 tablet 3  . tamoxifen (NOLVADEX) 20 MG tablet Take 1 tablet (20 mg total) by mouth daily. 90 tablet 3   No current facility-administered medications for this visit.    PHYSICAL EXAMINATION: ECOG PERFORMANCE STATUS: 1 - Symptomatic but completely ambulatory  Vitals:   09/05/19 0920  BP: 139/69  Pulse: 87  Resp: 17  Temp: 98.1 F (36.7 C)  SpO2: 98%   Filed Weights   09/05/19 0920  Weight: 171 lb 9.6 oz (77.8 kg)    LABORATORY DATA:  I have reviewed the data as listed CMP Latest Ref Rng & Units 04/22/2019 05/10/2018 04/25/2018  Glucose 65 - 99 mg/dL 107(H) 105(H) 118(H)  BUN 7 - 25 mg/dL '16 15 15  '$ Creatinine 0.60 - 0.88 mg/dL 0.78 0.90 0.84  Sodium 135 - 146 mmol/L 142 139 143  Potassium 3.5 - 5.3 mmol/L 4.3 4.2 3.7  Chloride 98 - 110 mmol/L 108 102 109  CO2 20 - 32 mmol/L '27 27 23  '$ Calcium 8.6 - 10.4 mg/dL 9.5 9.7 9.4  Total Protein 6.1 - 8.1 g/dL 6.4 - 6.9  Total Bilirubin 0.2 - 1.2 mg/dL 0.5 - 0.6  Alkaline Phos 38 - 126 U/L - - 60  AST 10 - 35 U/L 17 - 18  ALT 6 - 29 U/L 14 - 19    Lab Results  Component Value Date   WBC 6.1 04/22/2019   HGB 13.2 04/22/2019   HCT 39.9 04/22/2019   MCV 91.9 04/22/2019   PLT 216 04/22/2019   NEUTROABS 3,562 04/22/2019    ASSESSMENT & PLAN:  Malignant neoplasm of upper-inner quadrant of right breast in female, estrogen receptor positive ([place]) 05/17/2018:Right lumpectomy: IDC, 5.5 mm, grade 1, margins negative, ER 100%, PR 100%, HER-2 1+ by IHC, Ki-67 less than 1%, T1BNX stage  Ia We did not recommend adjuvant radiation therapy because of her favorable profile and her age.  Current treatment: Letrozole 2.5 mg daily started 05/28/2018, switched to anastrozole 03/23/2019 (due to arthralgias and fatigue), switched to tamoxifen 06/04/2019  Tamoxifen toxicities: Denies any major side effects to tamoxifen Initially slight loss of appetite which resolved   Breast cancer surveillance:  Mammogram 04/09/2019: No evidence of malignancy breast density category C Breast exam 09/05/2019: Benign   Bone density in Nov 2021 Return to clinic in  1 year for follow-up.    No orders of the defined types were placed in this encounter.  The patient has a good understanding of the overall plan. she agrees with it. she will call with any problems that may develop before the next visit here.  Total time spent: 20 mins including face to face time and time spent for planning, charting and coordination of care  [name], MD 09/05/2019  I, Cloyde Reams Dorshimer,  am acting as scribe for Dr. [name].  I have reviewed the above documentation for accuracy and completeness, and I agree with the above.

## 2019-09-05 ENCOUNTER — Inpatient Hospital Stay: Payer: Medicare PPO | Attending: Hematology and Oncology | Admitting: Hematology and Oncology

## 2019-09-05 ENCOUNTER — Other Ambulatory Visit: Payer: Self-pay

## 2019-09-05 ENCOUNTER — Other Ambulatory Visit: Payer: Self-pay | Admitting: Hematology and Oncology

## 2019-09-05 VITALS — BP 139/69 | HR 87 | Temp 98.1°F | Resp 17 | Ht 65.0 in | Wt 171.6 lb

## 2019-09-05 DIAGNOSIS — Z853 Personal history of malignant neoplasm of breast: Secondary | ICD-10-CM

## 2019-09-05 DIAGNOSIS — Z780 Asymptomatic menopausal state: Secondary | ICD-10-CM | POA: Diagnosis not present

## 2019-09-05 DIAGNOSIS — Z79810 Long term (current) use of selective estrogen receptor modulators (SERMs): Secondary | ICD-10-CM | POA: Insufficient documentation

## 2019-09-05 DIAGNOSIS — C50211 Malignant neoplasm of upper-inner quadrant of right female breast: Secondary | ICD-10-CM | POA: Insufficient documentation

## 2019-09-05 DIAGNOSIS — Z170 Estrogen receptor positive status [ER+]: Secondary | ICD-10-CM | POA: Insufficient documentation

## 2019-09-05 NOTE — Assessment & Plan Note (Signed)
05/17/2018:Right lumpectomy: IDC, 5.5 mm, grade 1, margins negative, ER 100%, PR 100%, HER-2 1+ by IHC, Ki-67 less than 1%, T1BNX stage Ia We did not recommend adjuvant radiation therapy because of her favorable profile and her age.  Current treatment: Letrozole 2.5 mg daily started 05/28/2018, switched to anastrozole 03/23/2019 (due to arthralgias and fatigue), switched to tamoxifen 06/04/2019  Tamoxifen toxicities:   Breast cancer surveillance: Mammogram 04/09/2019: No evidence of malignancy breast density category C   Return to clinic in  1 year for follow-up. 

## 2019-09-06 ENCOUNTER — Telehealth: Payer: Self-pay | Admitting: Hematology and Oncology

## 2019-09-06 NOTE — Telephone Encounter (Signed)
I could not reach patient regarding schedule  °

## 2019-09-12 ENCOUNTER — Ambulatory Visit: Payer: Medicare PPO | Attending: Internal Medicine

## 2019-09-12 DIAGNOSIS — Z23 Encounter for immunization: Secondary | ICD-10-CM | POA: Insufficient documentation

## 2019-09-12 NOTE — Progress Notes (Signed)
   Covid-19 Vaccination Clinic  Name:  Rebecca Guzman    MRN: [id number] DOB: 1934-03-26  09/12/2019  Rebecca Guzman was observed post Covid-19 immunization for 15 minutes without incidence. She was provided with Vaccine Information Sheet and instruction to access the V-Safe system.   Rebecca Guzman was instructed to call 911 with any severe reactions post vaccine: Marland Kitchen Difficulty breathing  . Swelling of your face and throat  . A fast heartbeat  . A bad rash all over your body  . Dizziness and weakness    Immunizations Administered    Name Date Dose VIS Date Route   Pfizer COVID-19 Vaccine 09/12/2019  9:27 AM 0.3 mL 07/19/2019 Intramuscular   Manufacturer: [place]   Lot: [id number]   [place]: [id number]

## 2019-09-30 DIAGNOSIS — N8182 Incompetence or weakening of pubocervical tissue: Secondary | ICD-10-CM | POA: Diagnosis not present

## 2019-10-28 ENCOUNTER — Other Ambulatory Visit: Payer: Medicare PPO

## 2019-10-28 ENCOUNTER — Other Ambulatory Visit: Payer: Self-pay

## 2019-10-28 DIAGNOSIS — E7800 Pure hypercholesterolemia, unspecified: Secondary | ICD-10-CM | POA: Diagnosis not present

## 2019-10-28 LAB — LIPID PANEL
Cholesterol: 180 mg/dL (ref <200)
HDL: 70 mg/dL (ref >=50)
LDL Cholesterol (Calc): 86 mg/dL (calc)
Non-HDL Cholesterol (Calc): 110 mg/dL (calc) (ref <130)
Total CHOL/HDL Ratio: 2.6 (calc) (ref <5.0)
Triglycerides: 142 mg/dL (ref <150)

## 2019-10-28 NOTE — Progress Notes (Signed)
Cholesterol has improved :)

## 2019-10-29 ENCOUNTER — Encounter: Payer: Self-pay | Admitting: *Deleted

## 2019-10-31 ENCOUNTER — Encounter: Payer: Self-pay | Admitting: Internal Medicine

## 2019-10-31 ENCOUNTER — Other Ambulatory Visit: Payer: Self-pay

## 2019-10-31 ENCOUNTER — Ambulatory Visit (INDEPENDENT_AMBULATORY_CARE_PROVIDER_SITE_OTHER): Payer: Medicare PPO | Admitting: Internal Medicine

## 2019-10-31 VITALS — BP 138/78 | HR 79 | Temp 97.1°F | Ht 65.0 in | Wt 176.0 lb

## 2019-10-31 DIAGNOSIS — M1711 Unilateral primary osteoarthritis, right knee: Secondary | ICD-10-CM

## 2019-10-31 DIAGNOSIS — R739 Hyperglycemia, unspecified: Secondary | ICD-10-CM | POA: Diagnosis not present

## 2019-10-31 DIAGNOSIS — C50211 Malignant neoplasm of upper-inner quadrant of right female breast: Secondary | ICD-10-CM

## 2019-10-31 DIAGNOSIS — M810 Age-related osteoporosis without current pathological fracture: Secondary | ICD-10-CM

## 2019-10-31 DIAGNOSIS — N8110 Cystocele, unspecified: Secondary | ICD-10-CM | POA: Diagnosis not present

## 2019-10-31 DIAGNOSIS — Z170 Estrogen receptor positive status [ER+]: Secondary | ICD-10-CM | POA: Diagnosis not present

## 2019-10-31 DIAGNOSIS — M47816 Spondylosis without myelopathy or radiculopathy, lumbar region: Secondary | ICD-10-CM | POA: Diagnosis not present

## 2019-10-31 DIAGNOSIS — E7800 Pure hypercholesterolemia, unspecified: Secondary | ICD-10-CM | POA: Diagnosis not present

## 2019-10-31 NOTE — Progress Notes (Signed)
Location:  [facility] clinic Provider:  Sullivan Blasing [name], D.O., C.M.D.  Code Status: DNR Goals of Care:  Advanced Directives 10/31/2019  Does Patient Have a Medical Advance Directive? Yes  Type of Paramedic of [place];Living will;Out of facility DNR (pink MOST or yellow form)  Does patient want to make changes to medical advance directive? No - Patient declined  Copy of [place] in Chart? Yes - validated most recent copy scanned in chart (See row information)  Would patient like information on creating a medical advance directive? -  Pre-existing out of facility DNR order (yellow form or pink MOST form) Pink MOST/Yellow Form most recent copy in chart - Physician notified to receive inpatient order     Chief Complaint  Patient presents with  . Medical Management of Chronic Issues    6 month follow up, questions  about medications.     HPI: Patient is a 86 y.o. female seen today for medical management of chronic diseases.    She was taken off letrozole--gave her no energy, felt terrible so in Nov, she was put on tamoxifen (takes at hs).  She had taken it 29 years ago and tolerated.  Still has a lack of energy.  She took off for 10 days.  Restarted 2 wks ago at every other day.  She wants to have a better quality of life and feel decent than feel like she did.  She has good and bad days.  Not as tired.  After breakfast might have a wave of nausea.  Does not occur daily.    She's grateful she's still driving, not walking with a cane, etc.    Had audiology eval.  She was changed over to [place] with state.  [place] as good discount is covered partially by their program.  Both of her hearing aids need replacing.      Asks about taking tumeric capsules.  She continues with back and joint pain--she's been having tea with tumeric in it.  Her right knee is feeling better.  She may switch to capsules for summer.     April 1st she plans to start  back with water walking.  Wants to keep moving so she can move.  Allergies good right now.  A little bother by pollen, but moreso in fall for her.  Has really good good cholesterol and LDL is 86.    Bladder control--getting pessary changed every 3 mos. No bleeding.  Doing well.  Works well--not 100%.  prolia next in May, last in nov.  Already has her CPE scheduled here for fall.    Past Medical History:  Diagnosis Date  . Asthma   . BPPV (benign paroxysmal positional vertigo)   . Compression fracture of L3 lumbar vertebra 04/06/2015  . Family history of breast cancer   . Family history of colon cancer   . Family history of pancreatic cancer   . GERD (gastroesophageal reflux disease)   . Hiatal hernia    pt states she doesnt have this  . History of adenomatous polyp of colon    tubular adenoma 2005 and  2014 tubular adenoma and  hyperplastic polyp  . History of breast cancer per pt no recurrence   dx 1992 --  s/p  left breast mastectomy and chemotherapy  . Hyperlipidemia LDL goal <100   . Osteoarthritis of right knee   . Personal history of chemotherapy 1992  . PONV (postoperative nausea and vomiting)  and "gets weak and light-leaded"  . Sensation of pressure in bladder area   . Vaginal vault prolapse    anterior  . Wears glasses   . Wears hearing aid    left only    Past Surgical History:  Procedure Laterality Date  . BREAST BIOPSY Right 04/19/2018  . BREAST LUMPECTOMY Right 05/17/2018  . BREAST LUMPECTOMY WITH RADIOACTIVE SEED LOCALIZATION Right 05/17/2018   Procedure: RIGHT BREAST LUMPECTOMY WITH RADIOACTIVE SEED LOCALIZATION;  Surgeon: [name], MD;  Location: [facility];  Service: General;  Laterality: Right;  . CATARACT EXTRACTION W/ INTRAOCULAR LENS  IMPLANT, BILATERAL  2014  . CHOLECYSTECTOMY  1985  . COLONOSCOPY  last one 06-04-2013  . CYSTOCELE REPAIR N/A 02/29/2016   Procedure: ANTERIOR VAULT PROLAPSE REPAIR COLOPLAST [place] FIXATION AUGMENTED AXIS  DERMIS REPAIR  ;  Surgeon: Carolan Clines, MD;  Location: [place];  Service: Urology;  Laterality: N/A;  . ESOPHAGOGASTRODUODENOSCOPY  08-31-2009  . LAPAROSCOPIC ASSISTED VAGINAL HYSTERECTOMY  01-10-2000   w/ Bilateral Salpingoophorectomy /  Anterior & Posterior repair/  Pubovaginal Sling  . MASTECTOMY Left 1992    Allergies  Allergen Reactions  . Codeine Nausea And Vomiting  . Morphine And Related Other (See Comments)    Nausea, and drop in blood pressure    Outpatient Encounter Medications as of 10/31/2019  Medication Sig  . acetaminophen (TYLENOL) 500 MG tablet Take 1,000 mg by mouth every morning.   . Calcium Carbonate-Vit D-Min (CALCIUM 600+D3 PLUS MINERALS) 600-800 MG-UNIT TABS Take 2 tablets by mouth daily.  . fexofenadine (ALLEGRA) 180 MG tablet Take 180 mg by mouth daily.  . metoprolol tartrate (LOPRESSOR) 25 MG tablet TAKE 1 TABLET(25 MG) BY MOUTH TWICE DAILY  . Multiple Vitamin (MULTIVITAMIN) tablet Take 1 tablet by mouth daily.  . Omega-3 Fatty Acids (FISH OIL) 1000 MG CAPS Take 1 capsule (1,000 mg total) by mouth daily.  Marland Kitchen omeprazole (PRILOSEC) 40 MG capsule Take 1 capsule (40 mg total) by mouth 2 (two) times daily.  . Probiotic Product (PROBIOTIC FORMULA PO) Take 1 tablet by mouth every evening.   . rosuvastatin (CRESTOR) 5 MG tablet Take 1 tablet (5 mg total) by mouth 4 (four) times a week.  . tamoxifen (NOLVADEX) 20 MG tablet Take 1 tablet (20 mg total) by mouth daily.  . [DISCONTINUED] Black Elderberry 50 MG/5ML SYRP Take 5 mLs by mouth every Monday, Wednesday, and Friday.  . [DISCONTINUED] loratadine (CLARITIN) 10 MG tablet Take 10 mg by mouth daily.   No facility-administered encounter medications on file as of 10/31/2019.    Review of Systems:  Review of Systems  Constitutional: Negative for chills, fever and malaise/fatigue.  HENT: Positive for hearing loss. Negative for congestion and sore throat.        Needs new hearing aids  Eyes:  Negative for blurred vision.  Respiratory: Negative for cough and shortness of breath.   Cardiovascular: Negative for chest pain, palpitations and leg swelling.  Gastrointestinal: Positive for nausea (in mornings sometimes with tamoxifen). Negative for abdominal pain, diarrhea and vomiting.  Genitourinary: Negative for dysuria.       Still has some leakage, uses pessary  Musculoskeletal: Positive for back pain and joint pain. Negative for falls.  Skin: Negative for itching and rash.  Neurological: Negative for dizziness and loss of consciousness.  Endo/Heme/Allergies: Bruises/bleeds easily.  Psychiatric/Behavioral: Negative for depression and memory loss. The patient is not nervous/anxious and does not have insomnia.     Health Maintenance  Topic Date  Due  . TETANUS/TDAP  01/25/2025  . INFLUENZA VACCINE  Completed  . DEXA SCAN  Completed  . PNA vac Low Risk Adult  Completed    Physical Exam: Vitals:   10/31/19 1029  BP: 138/78  Pulse: 79  Temp: (!) 97.1 F (36.2 C)  TempSrc: Temporal  SpO2: 97%  Weight: 176 lb (79.8 kg)  Height: 5\' 5"  (1.651 m)   Body mass index is 29.29 kg/m. Physical Exam Vitals reviewed.  Constitutional:      Appearance: Normal appearance.  HENT:     Head: Normocephalic and atraumatic.  Cardiovascular:     Rate and Rhythm: Normal rate and regular rhythm.     Pulses: Normal pulses.     Heart sounds: Normal heart sounds.  Pulmonary:     Effort: Pulmonary effort is normal.     Breath sounds: Normal breath sounds. No wheezing, rhonchi or rales.  Abdominal:     General: Bowel sounds are normal.  Musculoskeletal:        General: Normal range of motion.     Right lower leg: No edema.     Left lower leg: No edema.  Skin:    General: Skin is warm and dry.  Neurological:     General: No focal deficit present.     Mental Status: She is alert and oriented to person, place, and time.     Labs reviewed: Basic Metabolic Panel: Recent Labs     04/22/19 0803  NA 142  K 4.3  CL 108  CO2 27  GLUCOSE 107*  BUN 16  CREATININE 0.78  CALCIUM 9.5   Liver Function Tests: Recent Labs    04/22/19 0803  AST 17  ALT 14  BILITOT 0.5  PROT 6.4   No results for input(s): LIPASE, AMYLASE in the last 8760 hours. No results for input(s): AMMONIA in the last 8760 hours. CBC: Recent Labs    04/22/19 0803  WBC 6.1  NEUTROABS 3,562  HGB 13.2  HCT 39.9  MCV 91.9  PLT 216   Lipid Panel: Recent Labs    04/22/19 0803 10/28/19 0804  CHOL 192 180  HDL 59 70  LDLCALC 107* 86  TRIG 147 142  CHOLHDL 3.3 2.6   Lab Results  Component Value Date   HGBA1C 5.5 04/22/2019    Assessment/Plan 1. Pure hypercholesterolemia -cont crestor therapy for hyperlipidemia 4 x per week -she'll be getting back to water walking also to help this  2. Primary osteoarthritis of right knee -better with tumeric use in tea and may start capsules now  3. Malignant neoplasm of upper-inner quadrant of right breast in female, estrogen receptor positive ([place]) -cont tamoxifen therapy qod b/c daily affects her quality of life too much with aches and pains and nausea and fatigue  4. Facet arthropathy, lumbar -has good and bad days, uses tylenol for pain and getting back to water walking which helped, too  5. Hyperglycemia -mild, cont to monitor Lab Results  Component Value Date   HGBA1C 5.5 04/22/2019   6. Bladder prolapse, female, acquired -cont pessary use which has been helpful after surgery was ineffective  7. Senile osteoporosis -cont prolia, ca with D3 and some weightbearing exercise plus going back to water walking which does better for her knee and back  Labs/tests ordered:   Lab Orders  No laboratory test(s) ordered today   Next appt:  CPE in 6 mos already scheduled and supposed to be labs same day per schedule  Rebecca Guzman L.  Rebecca Guzman, D.O. [facility] [address], [place]  [address] Cell Phone (Mon-Fri 8am-5pm):  [phone] On Call:  [phone] & follow prompts after 5pm & weekends Office Phone:  [phone] Office Fax:  [phone]

## 2019-11-19 DIAGNOSIS — H903 Sensorineural hearing loss, bilateral: Secondary | ICD-10-CM | POA: Diagnosis not present

## 2019-12-19 ENCOUNTER — Other Ambulatory Visit: Payer: Self-pay

## 2019-12-19 ENCOUNTER — Ambulatory Visit: Payer: Medicare PPO | Admitting: Cardiology

## 2019-12-19 ENCOUNTER — Encounter: Payer: Self-pay | Admitting: Cardiology

## 2019-12-19 VITALS — BP 136/72 | HR 73 | Ht 65.0 in | Wt 172.8 lb

## 2019-12-19 DIAGNOSIS — E782 Mixed hyperlipidemia: Secondary | ICD-10-CM

## 2019-12-19 DIAGNOSIS — I491 Atrial premature depolarization: Secondary | ICD-10-CM

## 2019-12-19 DIAGNOSIS — E785 Hyperlipidemia, unspecified: Secondary | ICD-10-CM

## 2019-12-19 DIAGNOSIS — R002 Palpitations: Secondary | ICD-10-CM | POA: Diagnosis not present

## 2019-12-19 DIAGNOSIS — I10 Essential (primary) hypertension: Secondary | ICD-10-CM

## 2019-12-19 MED ORDER — ROSUVASTATIN CALCIUM 5 MG PO TABS: 5.0000 mg | ORAL_TABLET | ORAL | 3 refills | Status: DC

## 2019-12-19 NOTE — Progress Notes (Signed)
Cardiology Office Note    Date:  12/19/2019   ID:  Rebecca Guzman, DOB 01/18/1934, MRN [id number]  PCP:  [name], DO  Cardiologist:  Ena Dawley, MD   Referring physician: [name], DO   History of Present Illness:  Rebecca Guzman is a 86 y.o. female who has been followed for palpitations and hyperlipidemia.  In 2018 she underwent Holter monitoring that showed frequent PACs (700 in 34 hrs) and few very short runs of atrial tachycardia with the longest lasting 4 beats. She was started on metoprolol 12.5 mg by mouth twice a day with significant improvement of symptoms. She also had an echocardiogram performed that showed LVEF 60-65% and grade 1 diastolic dysfunction, trivial MR, normal left atrial size and mild TR. her metoprolol was later increased to 25 mg p.o. twice daily with improvement of her symptoms. The patient had breast cancer 29 years ago and again was diagnosed with recurrent breast cancer last year in March.  Her original cancer was stage I and she underwent a lumpectomy and did not require any chemo.  She has been experiencing a lot of generalized muscle and joint pain and she attributes it to pravastatin.  She was switched to rosuvastatin that she is tolerating well with minimal dose only taken 3-4 times a week.   Today she states that she has been doing well, she continues to walk without any symptoms of chest pain or shortness of breath, her only concern is fatigue from tamoxifen.  She is tolerating rosuvastatin 5 mg 4 times a week.  She has no symptoms of orthostatic hypotension.  No lower extremity edema orthopnea proximal nocturnal dyspnea.  Her palpitations are minimal since she increase her dose of metoprolol to 25 mg p.o. twice daily.  Past Medical History:  Diagnosis Date  . Asthma   . BPPV (benign paroxysmal positional vertigo)   . Compression fracture of L3 lumbar vertebra 04/06/2015  . Family history of breast cancer   . Family history of colon  cancer   . Family history of pancreatic cancer   . GERD (gastroesophageal reflux disease)   . Hiatal hernia    pt states she doesnt have this  . History of adenomatous polyp of colon    tubular adenoma 2005 and  2014 tubular adenoma and  hyperplastic polyp  . History of breast cancer per pt no recurrence   dx 1992 --  s/p  left breast mastectomy and chemotherapy  . Hyperlipidemia LDL goal <100   . Osteoarthritis of right knee   . Personal history of chemotherapy 1992  . PONV (postoperative nausea and vomiting)    and "gets weak and light-leaded"  . Sensation of pressure in bladder area   . Vaginal vault prolapse    anterior  . Wears glasses   . Wears hearing aid    left only    Past Surgical History:  Procedure Laterality Date  . BREAST BIOPSY Right 04/19/2018  . BREAST LUMPECTOMY Right 05/17/2018  . BREAST LUMPECTOMY WITH RADIOACTIVE SEED LOCALIZATION Right 05/17/2018   Procedure: RIGHT BREAST LUMPECTOMY WITH RADIOACTIVE SEED LOCALIZATION;  Surgeon: [name], MD;  Location: [facility];  Service: General;  Laterality: Right;  . CATARACT EXTRACTION W/ INTRAOCULAR LENS  IMPLANT, BILATERAL  2014  . CHOLECYSTECTOMY  1985  . COLONOSCOPY  last one 06-04-2013  . CYSTOCELE REPAIR N/A 02/29/2016   Procedure: ANTERIOR VAULT PROLAPSE REPAIR COLOPLAST [place] FIXATION AUGMENTED AXIS DERMIS REPAIR  ;  Surgeon: Carolan Clines,  MD;  Location: [facility];  Service: Urology;  Laterality: N/A;  . ESOPHAGOGASTRODUODENOSCOPY  08-31-2009  . LAPAROSCOPIC ASSISTED VAGINAL HYSTERECTOMY  01-10-2000   w/ Bilateral Salpingoophorectomy /  Anterior & Posterior repair/  Pubovaginal Sling  . MASTECTOMY Left 1992    Current Medications: Outpatient Medications Prior to Visit  Medication Sig Dispense Refill  . acetaminophen (TYLENOL) 500 MG tablet Take 1,000 mg by mouth every morning.     . Calcium Carbonate-Vit D-Min (CALCIUM 600+D3 PLUS MINERALS) 600-800 MG-UNIT TABS Take 2 tablets  by mouth daily. 180 tablet 3  . fexofenadine (ALLEGRA) 180 MG tablet Take 180 mg by mouth daily.    . metoprolol tartrate (LOPRESSOR) 25 MG tablet TAKE 1 TABLET(25 MG) BY MOUTH TWICE DAILY 180 tablet 3  . Multiple Vitamin (MULTIVITAMIN) tablet Take 1 tablet by mouth daily.    . Omega-3 Fatty Acids (FISH OIL) 1000 MG CAPS Take 1 capsule (1,000 mg total) by mouth daily.  0  . omeprazole (PRILOSEC) 40 MG capsule Take 1 capsule (40 mg total) by mouth 2 (two) times daily. 180 capsule 1  . Probiotic Product (PROBIOTIC FORMULA PO) Take 1 tablet by mouth every evening.     . tamoxifen (NOLVADEX) 20 MG tablet Take 1 tablet (20 mg total) by mouth daily. 90 tablet 3  . rosuvastatin (CRESTOR) 5 MG tablet Take 1 tablet (5 mg total) by mouth 4 (four) times a week. 48 tablet 3   No facility-administered medications prior to visit.     Allergies:   Codeine and Morphine and related   Social History   Socioeconomic History  . Marital status: Widowed    Spouse name: Not on file  . Number of children: 4  . Years of education: Not on file  . Highest education level: Not on file  Occupational History  . Occupation: Retired Product manager: RETIRED  Tobacco Use  . Smoking status: Former Smoker    Years: 13.00    Types: Cigarettes    Quit date: 10/12/1983    Years since quitting: 36.2  . Smokeless tobacco: Never Used  Substance and Sexual Activity  . Alcohol use: No  . Drug use: No  . Sexual activity: Not on file  Other Topics Concern  . Not on file  Social History Narrative  . Not on file   Social Determinants of Health   Financial Resource Strain:   . Difficulty of Paying Living Expenses:   Food Insecurity:   . Worried About Charity fundraiser in the Last Year:   . Arboriculturist in the Last Year:   Transportation Needs:   . Film/video editor (Medical):   Marland Kitchen Lack of Transportation (Non-Medical):   Physical Activity:   . Days of Exercise per Week:   . Minutes of Exercise per  Session:   Stress:   . Feeling of Stress :   Social Connections:   . Frequency of Communication with Friends and Family:   . Frequency of Social Gatherings with Friends and Family:   . Attends Religious Services:   . Active Member of Clubs or Organizations:   . Attends Archivist Meetings:   Marland Kitchen Marital Status:      Family History:  The patient's family history includes Breast cancer in her sister; Colon cancer (age of onset: 33) in her mother; Heart attack in her mother; Heart disease in her father; Hypertension in her mother; Pancreatic cancer (age of onset: 35) in  an other family member.   ROS:   Please see the history of present illness.    ROS All other systems reviewed and are negative.  PHYSICAL EXAM:   VS:  BP 136/72   Pulse 73   Ht 5\' 5"  (1.651 m)   Wt 172 lb 12.8 oz (78.4 kg)   LMP 11/21/1985   SpO2 98%   BMI 28.76 kg/m    GEN: Well nourished, well developed, in no acute distress  HEENT: normal  Neck: no JVD, carotid bruits, or masses Cardiac: RRR; no murmurs, rubs, or gallops,no edema, Poor pulses in the left lower extremity.  Respiratory:  clear to auscultation bilaterally, normal work of breathing GI: soft, nontender, nondistended, + BS MS: no deformity or atrophy  Skin: warm and dry, no rash Neuro:  Alert and Oriented x 3, Strength and sensation are intact Psych: euthymic mood, full affect  Wt Readings from Last 3 Encounters:  12/19/19 172 lb 12.8 oz (78.4 kg)  10/31/19 176 lb (79.8 kg)  09/05/19 171 lb 9.6 oz (77.8 kg)    Studies/Labs Reviewed:   EKG:  EKG is ordered today.  The ekg ordered today demonstrates Normal sinus rhythm, normal EKG. Personally reviewed.  Recent Labs: 04/22/2019: ALT 14; BUN 16; Creat 0.78; Hemoglobin 13.2; Platelets 216; Potassium 4.3; Sodium 142   Lipid Panel    Component Value Date/Time   CHOL 180 10/28/2019 0804   CHOL 187 01/26/2016 0802   TRIG 142 10/28/2019 0804   HDL 70 10/28/2019 0804   HDL 59 01/26/2016  0802   CHOLHDL 2.6 10/28/2019 0804   VLDL 33 (H) 02/13/2017 0832   LDLCALC 86 10/28/2019 0804    Additional studies/ records that were reviewed today include:   TTE: 10/2016 - Left ventricle: The cavity size was normal. Wall thickness was   increased in a pattern of mild LVH. Systolic function was normal.   The estimated ejection fraction was in the range of 60% to 65%.   Wall motion was normal; there were no regional wall motion   abnormalities. Doppler parameters are consistent with abnormal   left ventricular relaxation (grade 1 diastolic dysfunction). The   E/e&' ratio is >15, suggesting elevated LV filling pressure. - Mitral valve: Mildly thickened and sclerotic anterior leaflet.   There was trivial regurgitation. - Left atrium: The atrium was normal in size. - Tricuspid valve: There was mild regurgitation. - Pulmonary arteries: PA peak pressure: 37 mm Hg (S). - Inferior vena cava: The vessel was normal in size. The   respirophasic diameter changes were in the normal range (>= 50%),   consistent with normal central venous pressure.  Impressions:  - LVEF 60-65%, mild LVH, normal wall motion, grade 1 DD with   elevated LV filling pressure, trivial MR, normal LA size, mild   TR, RVSP 37 mmHg, normal IVC.  EKG was not performed today, I have reviewed 1 from May 02, 2019 includes normal and unchanged from prior.    ASSESSMENT:    1. Hyperlipidemia LDL goal <100   2. Mixed hyperlipidemia   3. Essential hypertension   4. PAC (premature atrial contraction)   5. Palpitations    PLAN:  In order of problems listed above:  1. Palpitations, Normal echocardiogram, normal left atrial size, normal labs including electrolytes and TSH. Holter monitor showed 700 PACs in 36 hours and few very short runs of atrial tachycardia, significant symptoms improvement with metoprolol, this was recently increased to 25 mg twice daily.  We will  continue same management.   2. Blood pressure  is well controlled, repeat blood pressure 118/68 mmHg. 3. Hyperlipidemia -currently on rosuvastatin 5 mg 4 times a week and she tolerates it well.  All her lipids were at goal. 4. Recurrent breast cancer -currently on anastrozole that she does not tolerate where and wants to switch back to tamoxifen.  Follow-up in 1 year.  Medication Adjustments/Labs and Tests Ordered: Current medicines are reviewed at length with the patient today.  Concerns regarding medicines are outlined above.  Medication changes, Labs and Tests ordered today are listed in the Patient Instructions below. Patient Instructions  Medication Instructions:   Your physician recommends that you continue on your current medications as directed. Please refer to the Current Medication list given to you today.  *If you need a refill on your cardiac medications before your next appointment, please call your pharmacy*  Follow-Up: At [facility], you and your health needs are our priority.  As part of our continuing mission to provide you with exceptional heart care, we have created designated Provider Care Teams.  These Care Teams include your primary Cardiologist (physician) and Advanced Practice Providers (APPs -  Physician Assistants and Nurse Practitioners) who all work together to provide you with the care you need, when you need it.  We recommend signing up for the patient portal called "MyChart".  Sign up information is provided on this After Visit Summary.  MyChart is used to connect with patients for Virtual Visits (Telemedicine).  Patients are able to view lab/test results, encounter notes, upcoming appointments, etc.  Non-urgent messages can be sent to your provider as well.   To learn more about what you can do with MyChart, go to [email or web address].    Your next appointment:   12 month(s)  The format for your next appointment:   In Person  Provider:   Ena Dawley, MD        Signed, Ena Dawley,  MD  12/19/2019 2:08 PM    [facility] [place], [place],   [address] Phone: [phone]; Fax: [phone]

## 2019-12-19 NOTE — Patient Instructions (Signed)
Medication Instructions:  ° °Your physician recommends that you continue on your current medications as directed. Please refer to the Current Medication list given to you today. ° °*If you need a refill on your cardiac medications before your next appointment, please call your pharmacy* ° °Follow-Up: °At [facility], you and your health needs are our priority.  As part of our continuing mission to provide you with exceptional heart care, we have created designated Provider Care Teams.  These Care Teams include your primary Cardiologist (physician) and Advanced Practice Providers (APPs -  Physician Assistants and Nurse Practitioners) who all work together to provide you with the care you need, when you need it. ° °We recommend signing up for the patient portal called "MyChart".  Sign up information is provided on this After Visit Summary.  MyChart is used to connect with patients for Virtual Visits (Telemedicine).  Patients are able to view lab/test results, encounter notes, upcoming appointments, etc.  Non-urgent messages can be sent to your provider as well.   °To learn more about what you can do with MyChart, go to [email or web address].   ° °Your next appointment:   °12 month(s) ° °The format for your next appointment:   °In Person ° °Provider:   °Katarina Nelson, MD ° ° ° ° °

## 2019-12-27 ENCOUNTER — Other Ambulatory Visit: Payer: Self-pay

## 2019-12-27 ENCOUNTER — Ambulatory Visit: Payer: Medicare PPO

## 2019-12-27 DIAGNOSIS — M810 Age-related osteoporosis without current pathological fracture: Secondary | ICD-10-CM | POA: Diagnosis not present

## 2019-12-27 MED ORDER — DENOSUMAB 60 MG/ML ~~LOC~~ SOSY
60.0000 mg | PREFILLED_SYRINGE | Freq: Once | SUBCUTANEOUS | Status: AC
  Administered 2019-12-27: 60 mg via SUBCUTANEOUS

## 2019-12-31 DIAGNOSIS — N8110 Cystocele, unspecified: Secondary | ICD-10-CM | POA: Diagnosis not present

## 2020-02-28 ENCOUNTER — Encounter: Payer: Self-pay | Admitting: Podiatry

## 2020-02-28 ENCOUNTER — Ambulatory Visit: Payer: Medicare PPO | Admitting: Podiatry

## 2020-02-28 ENCOUNTER — Other Ambulatory Visit: Payer: Self-pay

## 2020-02-28 DIAGNOSIS — Q828 Other specified congenital malformations of skin: Secondary | ICD-10-CM

## 2020-02-28 DIAGNOSIS — M79672 Pain in left foot: Secondary | ICD-10-CM

## 2020-02-28 DIAGNOSIS — M79671 Pain in right foot: Secondary | ICD-10-CM

## 2020-02-28 NOTE — Patient Instructions (Signed)

## 2020-03-02 NOTE — Progress Notes (Signed)
Subjective:  Patient ID: Rebecca Guzman, female    DOB: [birth year],  MRN: [id number]  86 y.o. female presents with painful plantar lesions b/l feet.  Pain prevent comfortable ambulation. Aggravating factor is weightbearing with or without shoegear. She does have custom orthotics and relates they work pretty well controlling her plantar lesions.  Review of Systems: Negative except as noted in the HPI.  Past Medical History:  Diagnosis Date  . Asthma   . BPPV (benign paroxysmal positional vertigo)   . Compression fracture of L3 lumbar vertebra 04/06/2015  . Family history of breast cancer   . Family history of colon cancer   . Family history of pancreatic cancer   . GERD (gastroesophageal reflux disease)   . Hiatal hernia    pt states she doesnt have this  . History of adenomatous polyp of colon    tubular adenoma 2005 and  2014 tubular adenoma and  hyperplastic polyp  . History of breast cancer per pt no recurrence   dx 1992 --  s/p  left breast mastectomy and chemotherapy  . Hyperlipidemia LDL goal <100   . Osteoarthritis of right knee   . Personal history of chemotherapy 1992  . PONV (postoperative nausea and vomiting)    and "gets weak and light-leaded"  . Sensation of pressure in bladder area   . Vaginal vault prolapse    anterior  . Wears glasses   . Wears hearing aid    left only   Past Surgical History:  Procedure Laterality Date  . BREAST BIOPSY Right 04/19/2018  . BREAST LUMPECTOMY Right 05/17/2018  . BREAST LUMPECTOMY WITH RADIOACTIVE SEED LOCALIZATION Right 05/17/2018   Procedure: RIGHT BREAST LUMPECTOMY WITH RADIOACTIVE SEED LOCALIZATION;  Surgeon: [name], MD;  Location: [facility];  Service: General;  Laterality: Right;  . CATARACT EXTRACTION W/ INTRAOCULAR LENS  IMPLANT, BILATERAL  2014  . CHOLECYSTECTOMY  1985  . COLONOSCOPY  last one 06-04-2013  . CYSTOCELE REPAIR N/A 02/29/2016   Procedure: ANTERIOR VAULT PROLAPSE REPAIR COLOPLAST [place] FIXATION  AUGMENTED AXIS DERMIS REPAIR  ;  Surgeon: Carolan Clines, MD;  Location: [facility];  Service: Urology;  Laterality: N/A;  . ESOPHAGOGASTRODUODENOSCOPY  08-31-2009  . LAPAROSCOPIC ASSISTED VAGINAL HYSTERECTOMY  01-10-2000   w/ Bilateral Salpingoophorectomy /  Anterior & Posterior repair/  Pubovaginal Sling  . MASTECTOMY Left 1992   Patient Active Problem List   Diagnosis Date Noted  . Pure hypercholesterolemia 05/02/2019  . Genetic testing 06/07/2018  . Family history of breast cancer   . Family history of colon cancer   . Family history of pancreatic cancer   . Malignant neoplasm of upper-inner quadrant of right breast in female, estrogen receptor positive ([place]) 04/19/2018  . Wears hearing aid   . Wears glasses   . Vaginal vault prolapse   . Sensation of pressure in bladder area   . PONV (postoperative nausea and vomiting)   . History of breast cancer   . History of adenomatous polyp of colon   . Hiatal hernia   . BPPV (benign paroxysmal positional vertigo)   . Palpitations 10/31/2016  . Prolapse of female pelvic organs 02/29/2016  . Radicular pain of right lower back 04/06/2015  . Compression fracture of L3 lumbar vertebra 04/06/2015  . Senile osteoporosis 04/06/2015  . DDD (degenerative disc disease), lumbar 04/06/2015  . Facet arthropathy, lumbar 04/06/2015  . Other malaise and fatigue 01/17/2014  . Hx of adenomatous colonic polyps 07/11/2013  . Osteoarthritis of right knee   .  Hyperlipidemia LDL goal <100   . GERD (gastroesophageal reflux disease)   . Bladder prolapse, female, acquired   . Allergic sinusitis   . Esophageal reflux 10/12/2011  . Personal history of chemotherapy 08/08/1990    Current Outpatient Medications:  .  acetaminophen (TYLENOL) 500 MG tablet, Take 1,000 mg by mouth every morning. , Disp: , Rfl:  .  Calcium Carbonate-Vit D-Min (CALCIUM 600+D3 PLUS MINERALS) 600-800 MG-UNIT TABS, Take 2 tablets by mouth daily., Disp: 180 tablet,  Rfl: 3 .  fexofenadine (ALLEGRA) 180 MG tablet, Take 180 mg by mouth daily., Disp: , Rfl:  .  metoprolol tartrate (LOPRESSOR) 25 MG tablet, TAKE 1 TABLET(25 MG) BY MOUTH TWICE DAILY, Disp: 180 tablet, Rfl: 3 .  Multiple Vitamin (MULTIVITAMIN) tablet, Take 1 tablet by mouth daily., Disp: , Rfl:  .  Omega-3 Fatty Acids (FISH OIL) 1000 MG CAPS, Take 1 capsule (1,000 mg total) by mouth daily., Disp: , Rfl: 0 .  omeprazole (PRILOSEC) 40 MG capsule, Take 1 capsule (40 mg total) by mouth 2 (two) times daily., Disp: 180 capsule, Rfl: 1 .  Probiotic Product (PROBIOTIC FORMULA PO), Take 1 tablet by mouth every evening. , Disp: , Rfl:  .  rosuvastatin (CRESTOR) 5 MG tablet, Take 1 tablet (5 mg total) by mouth 4 (four) times a week., Disp: 48 tablet, Rfl: 3 .  tamoxifen (NOLVADEX) 20 MG tablet, Take 1 tablet (20 mg total) by mouth daily., Disp: 90 tablet, Rfl: 3 Allergies  Allergen Reactions  . Codeine Nausea And Vomiting  . Morphine And Related Other (See Comments)    Nausea, and drop in blood pressure   Social History   Occupational History  . Occupation: Retired Product manager: RETIRED  Tobacco Use  . Smoking status: Former Smoker    Years: 13.00    Types: Cigarettes    Quit date: 10/12/1983    Years since quitting: 36.4  . Smokeless tobacco: Never Used  Vaping Use  . Vaping Use: Never used  Substance and Sexual Activity  . Alcohol use: No  . Drug use: No  . Sexual activity: Not on file    Objective:   Constitutional Pt is a pleasant 86 y.o. Caucasian female in NAD.Marland Kitchen AAO x 3.   Vascular Capillary refill time to digits immediate b/l. Palpable pedal pulses b/l LE. Pedal hair sparse. Lower extremity skin temperature gradient within normal limits. No pain with calf compression b/l. No edema noted b/l lower extremities. No cyanosis or clubbing noted.  Neurologic Normal speech. Oriented to person, place, and time. Protective sensation intact 5/5 intact bilaterally with 10g monofilament  b/l. Vibratory sensation intact b/l. Clonus negative b/l.  Dermatologic Pedal skin with normal turgor, texture and tone bilaterally. No open wounds bilaterally. No interdigital macerations bilaterally. Toenails 1-5 b/l well maintained with adequate length. No erythema, no edema, no drainage, no flocculence. Porokeratotic lesion(s) submet head 1 right foot, submet head 3 left foot and submet head 5 left foot. No erythema, no edema, no drainage, no flocculence.  Orthopedic: Normal muscle strength 5/5 to all lower extremity muscle groups bilaterally. No pain crepitus or joint limitation noted with ROM b/l. No gross bony deformities bilaterally. Patient ambulates independent of any assistive aids.   Radiographs: None Assessment:   1. Porokeratosis   2. Pain in both feet    Plan:   -Examined patient. -No new findings. No new orders. -Painful porokeratotic lesion(s) submet head 1 right foot, submet head 3 right foot and submet head 5  left foot pared and enucleated with sterile scalpel blade without incident. -Patient to report any pedal injuries to medical professional immediately. -Patient to continue soft, supportive shoe gear daily. -Patient/POA to call should there be question/concern in the interim.  Return in about 18 months (around 08/30/2021) for callus trim.  Marzetta Board, DPM

## 2020-04-07 ENCOUNTER — Other Ambulatory Visit: Payer: Self-pay | Admitting: Hematology and Oncology

## 2020-04-07 DIAGNOSIS — M8580 Other specified disorders of bone density and structure, unspecified site: Secondary | ICD-10-CM

## 2020-04-09 ENCOUNTER — Other Ambulatory Visit: Payer: Self-pay

## 2020-04-09 ENCOUNTER — Ambulatory Visit
Admission: RE | Admit: 2020-04-09 | Discharge: 2020-04-09 | Disposition: A | Discharge status: Home / Self Care | Payer: Medicare PPO | Source: Ambulatory Visit | Attending: Hematology and Oncology | Admitting: Hematology and Oncology

## 2020-04-09 DIAGNOSIS — Z853 Personal history of malignant neoplasm of breast: Secondary | ICD-10-CM

## 2020-04-21 IMAGING — US ULTRASOUND RIGHT BREAST LIMITED
1 series · 11 of 11 positions shown · non-contrast
Comparison: Previous exam(s).

CLINICAL DATA: Patient was called back from screening mammogram for
a possible mass in the right breast.

EXAM:
DIGITAL DIAGNOSTIC RIGHT MAMMOGRAM WITH TOMO
ULTRASOUND RIGHT BREAST

[Series 1: ultrasound right breast limited · 0.07mm/px · 11 of 11 slices shown]
[im 1/11]
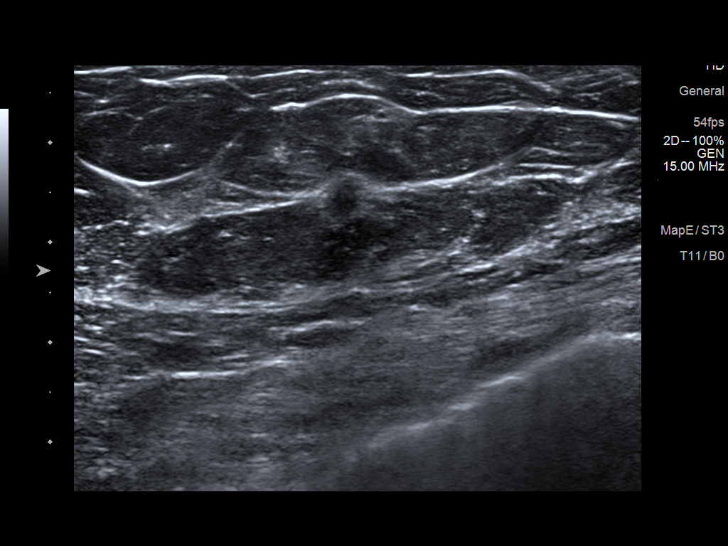
[im 2/11]
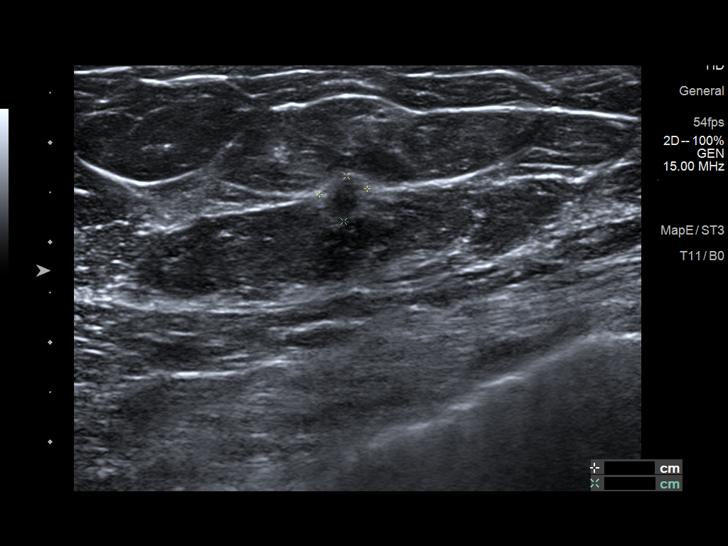
[im 3/11]
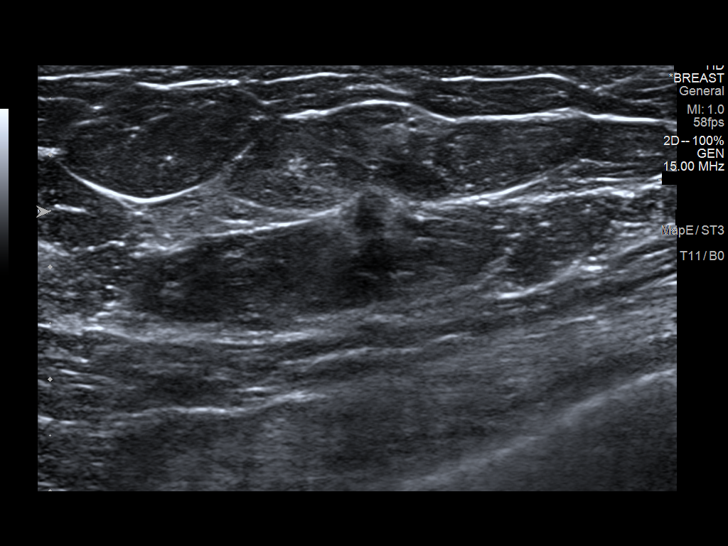
[im 4/11]
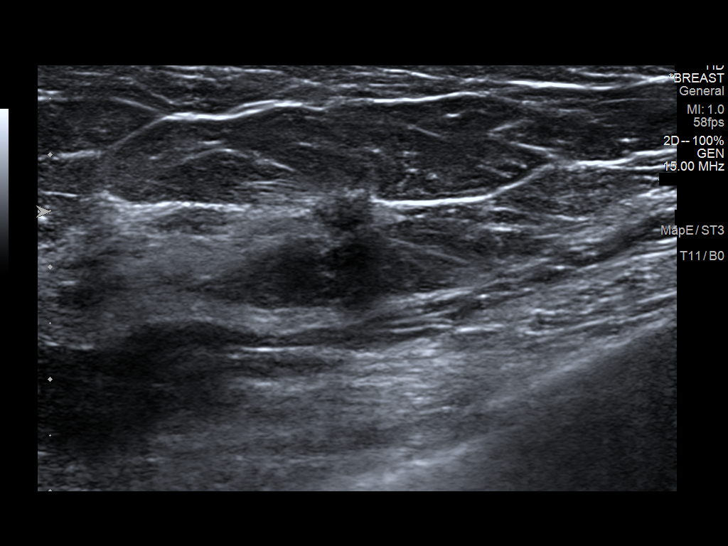
[im 5/11]
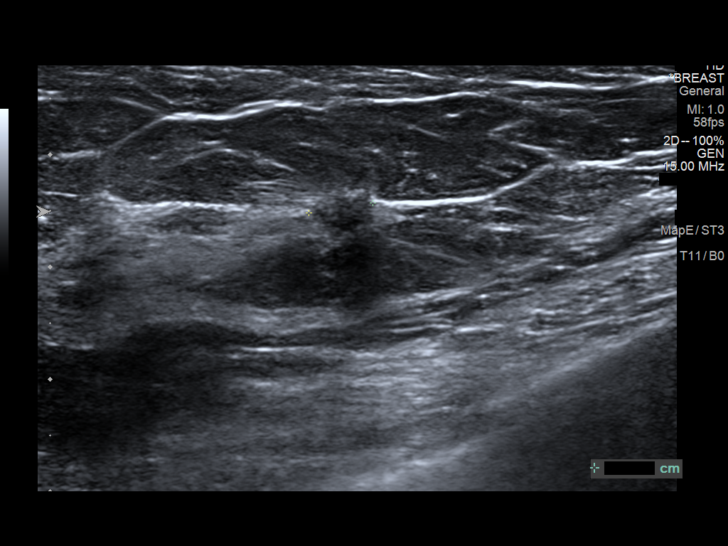
[im 6/11]
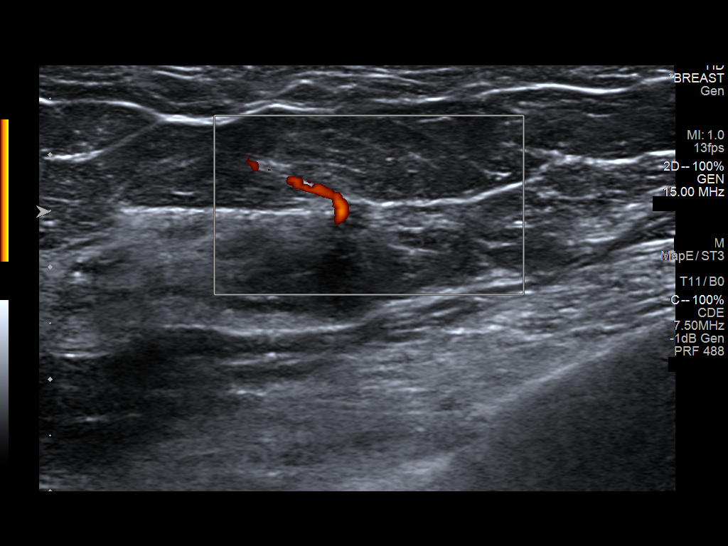
[im 7/11]
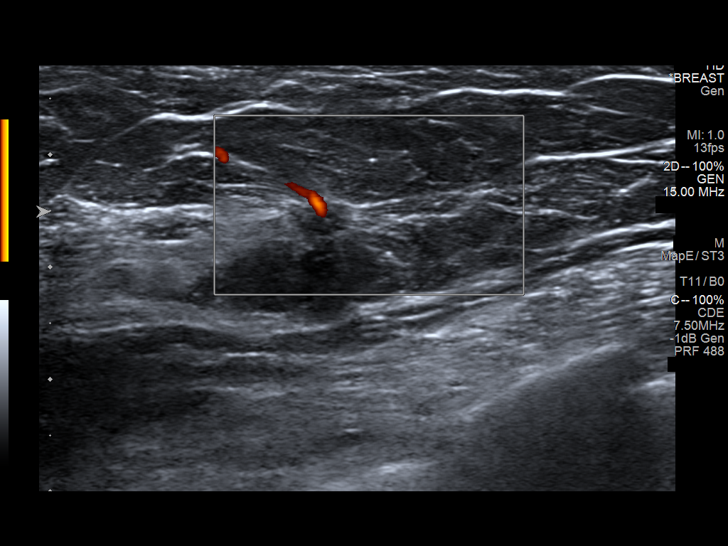
[im 8/11]
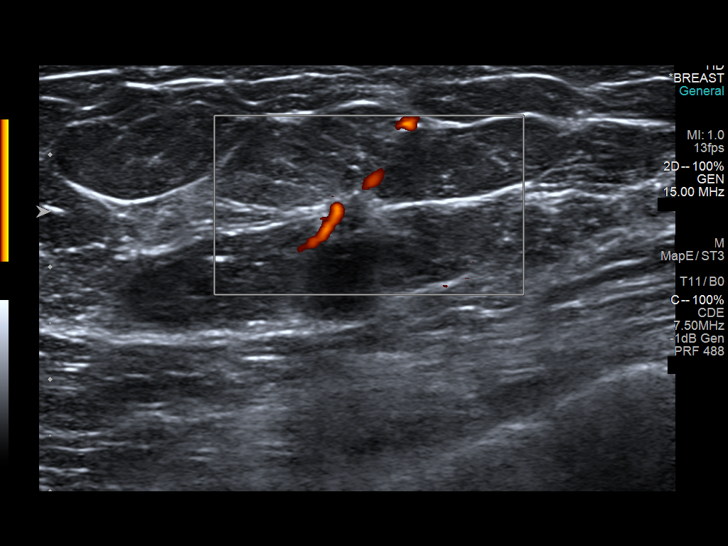
[im 9/11]
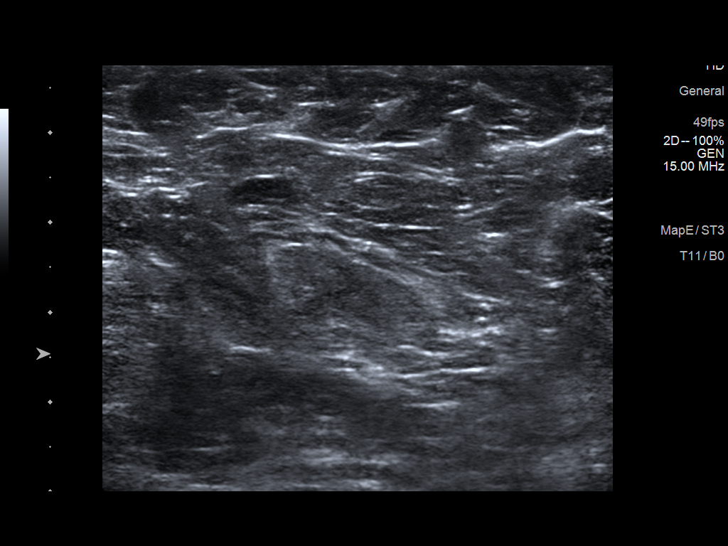
[im 10/11]
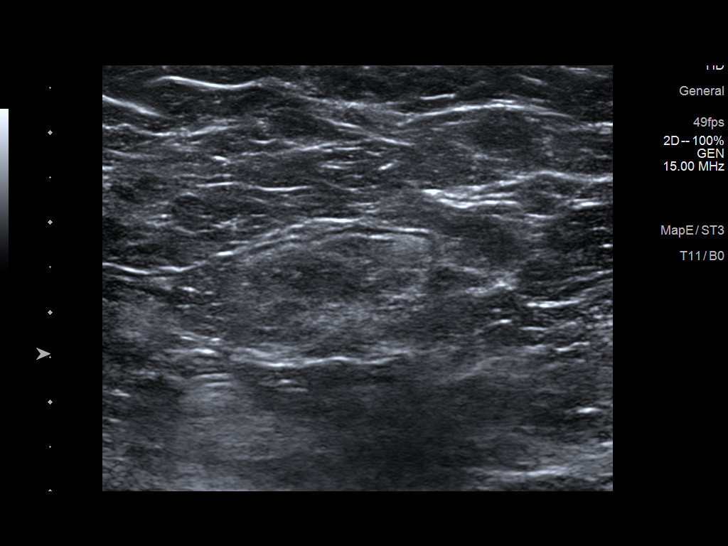
[im 11/11]
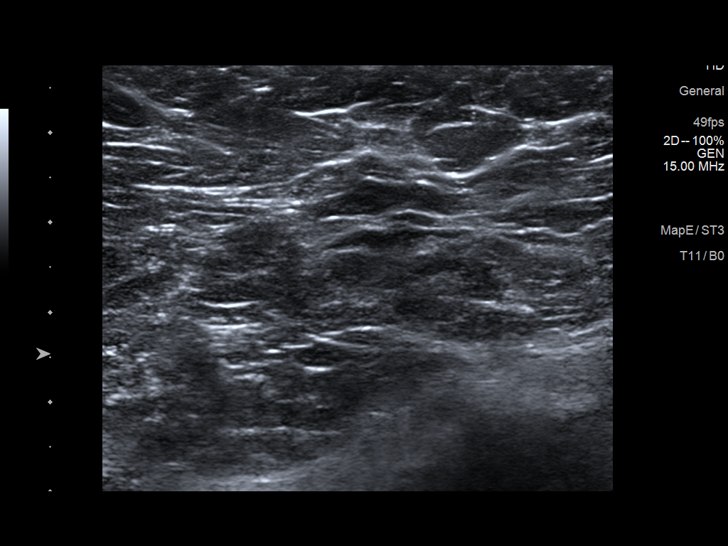

[11 of 11 positions shown; findings below may reference images not displayed]

ACR Breast Density Category c: The breast tissue is heterogeneously
dense, which may obscure small masses.
FINDINGS: Additional imaging of the right breast was performed showing
persistence of a spiculated 5 mm mass in the upper inner quadrant of
the right breast, middle depth. There are no malignant type
microcalcifications.

On physical exam, no mass is palpated in the upper-inner quadrant of
the right breast.

Targeted ultrasound is performed, showing a spiculated hypoechoic
mass in the right breast at 1 o'clock 7 cm from the nipple measuring
5 x 5 x 6 mm. Sonographic evaluation the right axilla does not show
any enlarged adenopathy.
IMPRESSION: Suspicious right breast mass.

RECOMMENDATION:
Ultrasound-guided core biopsy of the right breast mass is
recommended and will be scheduled at the patient's convenience.

I have discussed the findings and recommendations with the patient.
Results were also provided in writing at the conclusion of the
visit. If applicable, a reminder letter will be sent to the patient
regarding the next appointment.

BI-RADS CATEGORY  5: Highly suggestive of malignancy.

## 2020-04-21 IMAGING — MG DIGITAL DIAGNOSTIC UNILATERAL RIGHT MAMMOGRAM WITH TOMO AND CAD
4 series · 4 of 12 positions shown · non-contrast
Comparison: Previous exam(s).

CLINICAL DATA: Patient was called back from screening mammogram for
a possible mass in the right breast.

EXAM:
DIGITAL DIAGNOSTIC RIGHT MAMMOGRAM WITH TOMO
ULTRASOUND RIGHT BREAST

[R CC synth-2D]
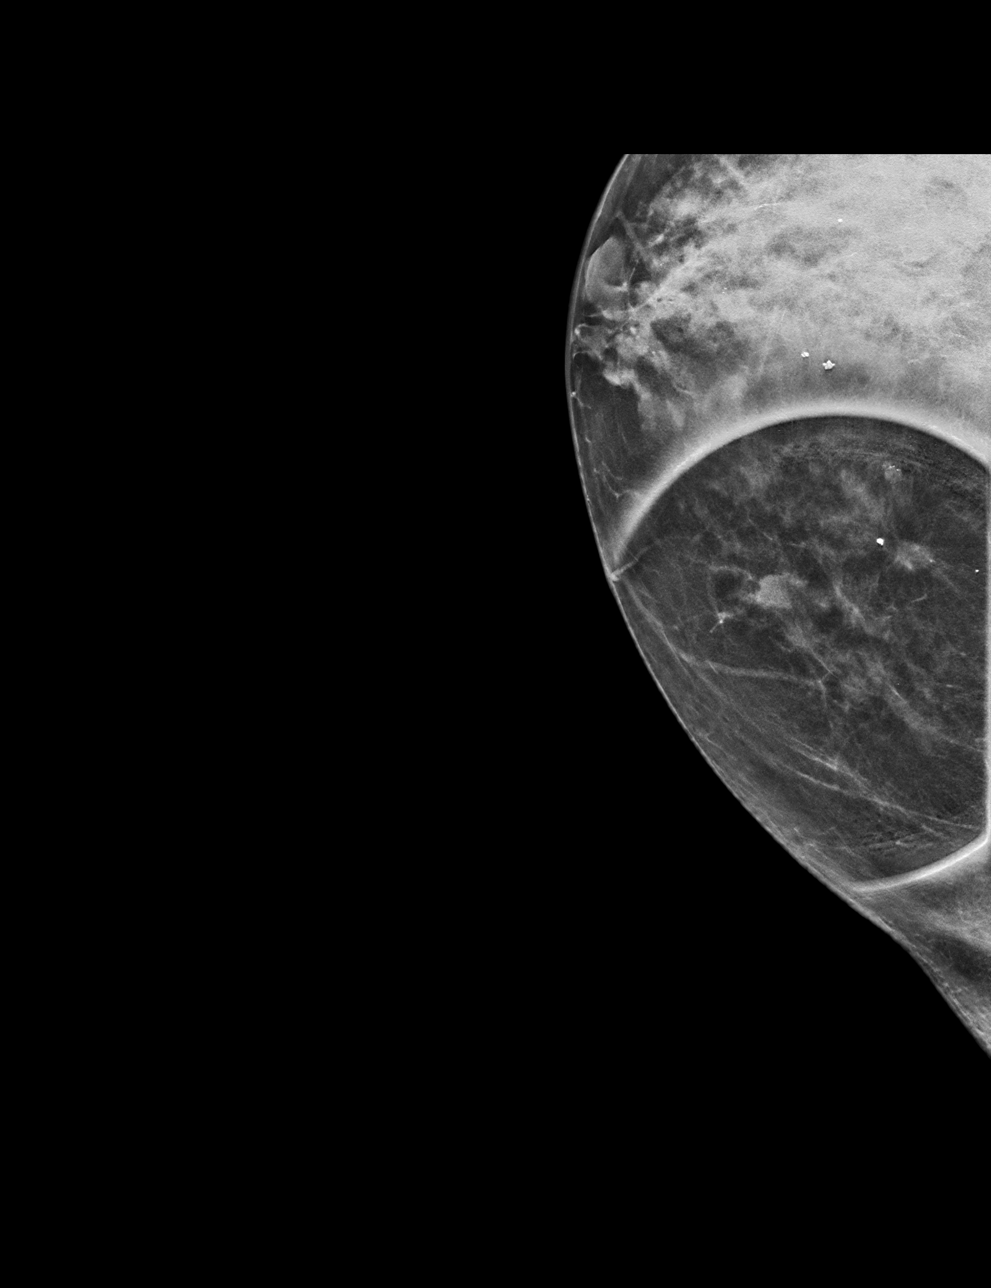

[R MLO synth-2D]
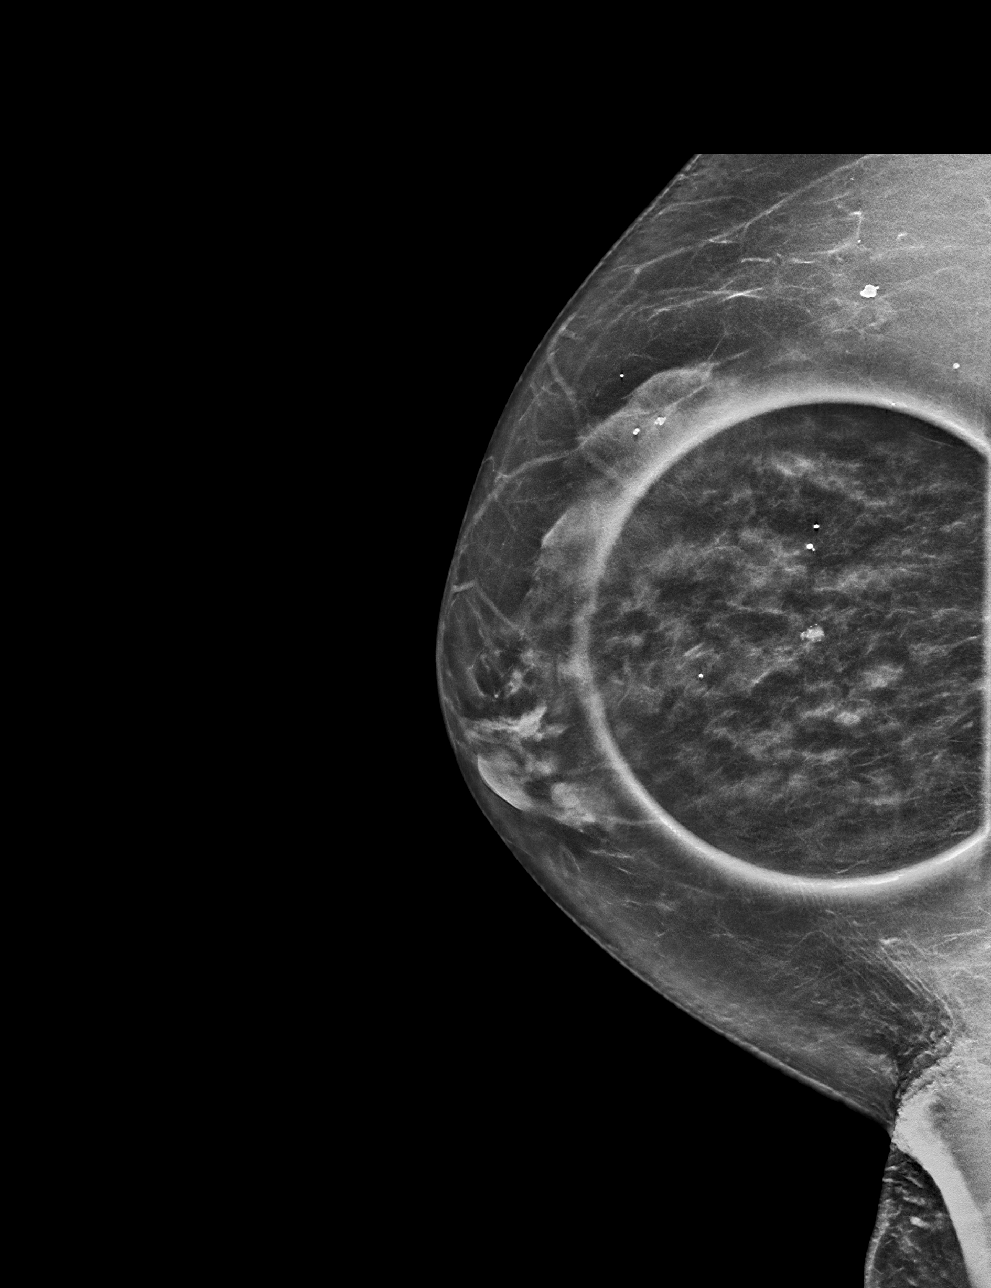

[R CC tomo · tomo slice 26/51.0]
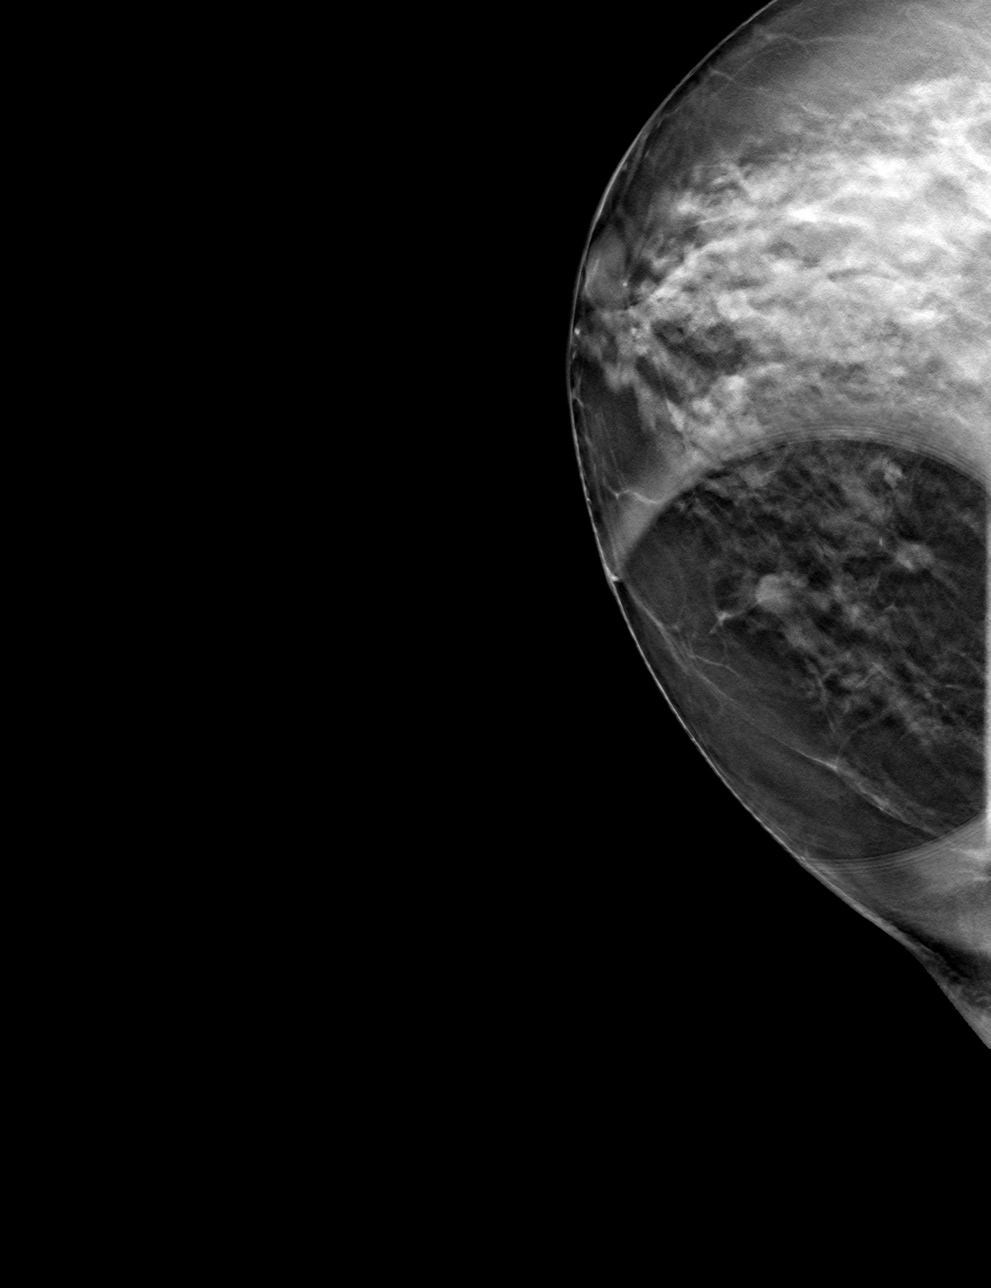

[R MLO tomo · tomo slice 31/62.0]
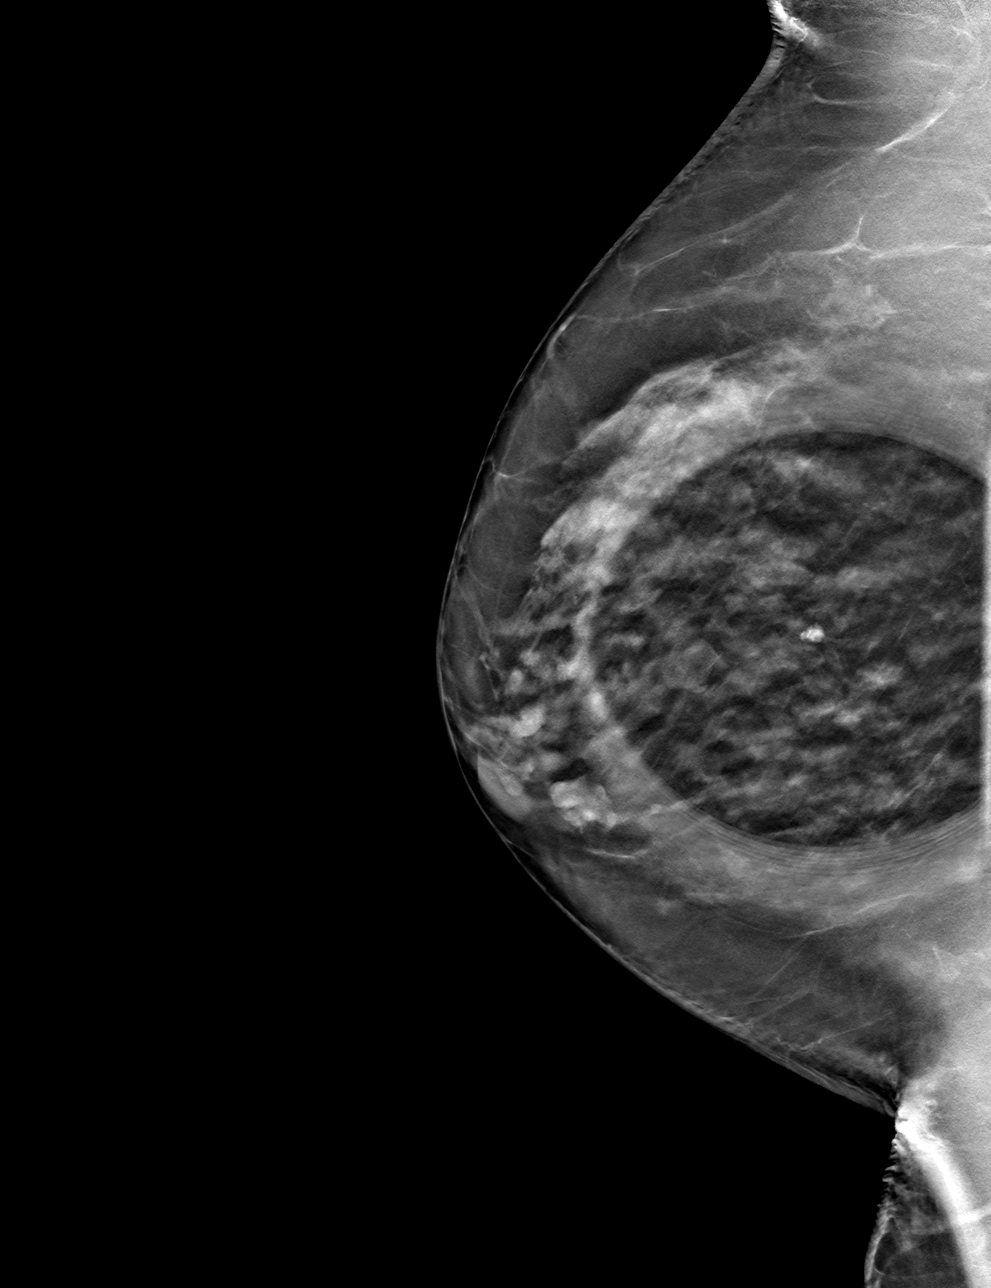

[4 of 12 positions shown; findings below may reference images not displayed]

ACR Breast Density Category c: The breast tissue is heterogeneously
dense, which may obscure small masses.
FINDINGS: Additional imaging of the right breast was performed showing
persistence of a spiculated 5 mm mass in the upper inner quadrant of
the right breast, middle depth. There are no malignant type
microcalcifications.

On physical exam, no mass is palpated in the upper-inner quadrant of
the right breast.

Targeted ultrasound is performed, showing a spiculated hypoechoic
mass in the right breast at 1 o'clock 7 cm from the nipple measuring
5 x 5 x 6 mm. Sonographic evaluation the right axilla does not show
any enlarged adenopathy.
IMPRESSION: Suspicious right breast mass.

RECOMMENDATION:
Ultrasound-guided core biopsy of the right breast mass is
recommended and will be scheduled at the patient's convenience.

I have discussed the findings and recommendations with the patient.
Results were also provided in writing at the conclusion of the
visit. If applicable, a reminder letter will be sent to the patient
regarding the next appointment.

BI-RADS CATEGORY  5: Highly suggestive of malignancy.

## 2020-04-22 IMAGING — US US BREAST BX W LOC DEV 1ST LESION IMG BX SPEC US GUIDE*R*
1 series · 7 of 7 positions shown · non-contrast
Comparison: Previous exam(s).

ADDENDUM:
Pathology revealed GRADE I INVASIVE DUCTAL CARCINOMA, DUCTAL
CARCINOMA IN SITU of the Right breast, 1 o'clock. This was found to
be concordant by Dr. Nazareth Jumper.

Pathology results were discussed with the patient by telephone. The
patient reported doing well after the biopsy with tenderness at the
site. Post biopsy instructions and care were reviewed and questions
were answered. The patient was encouraged to call The [REDACTED]
The patient was referred to [REDACTED]
[REDACTED] at [REDACTED] on
April 25, 2018.
Pathology results reported by Razmuhmmad Mogal, RN on 04/19/2018.
CLINICAL DATA: Patient with indeterminate right breast mass.
EXAM:
ULTRASOUND GUIDED RIGHT BREAST CORE NEEDLE BIOPSY

[Series 1: us breast bx w loc dev 1st lesion img bx spec us g · 0.07mm/px · 7 of 7 slices shown]
[im 1/7]
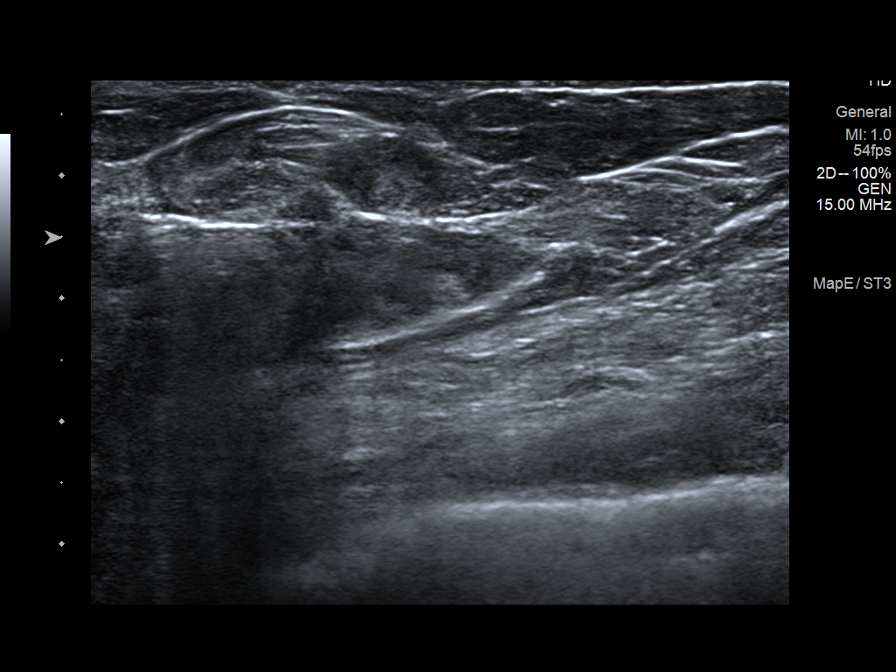
[im 2/7]
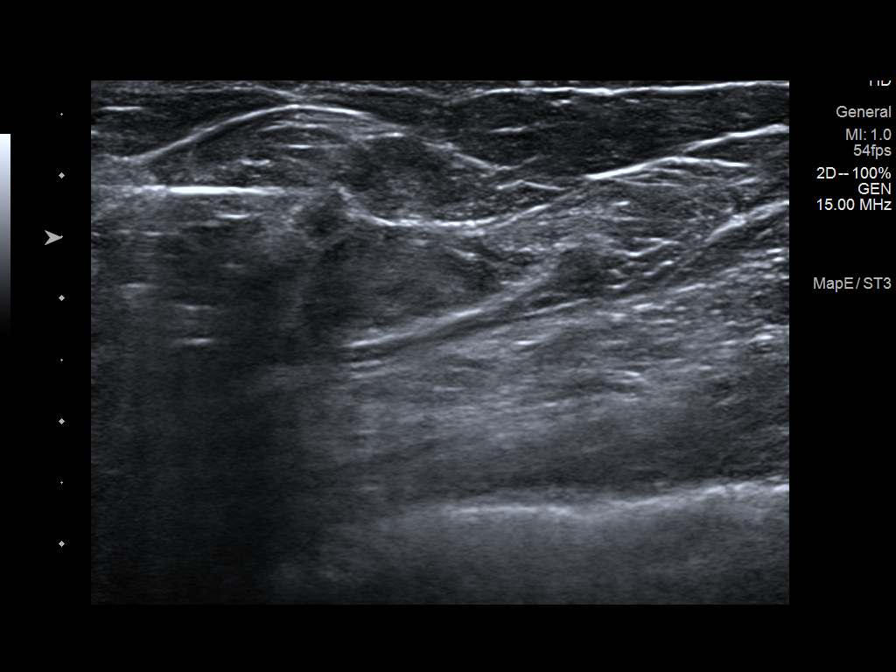
[im 3/7]
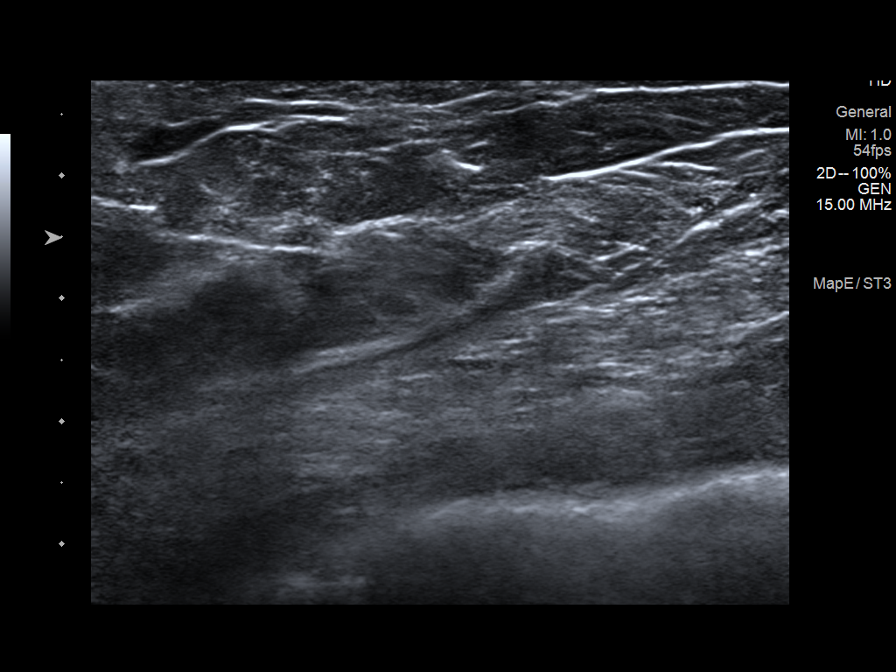
[im 4/7]
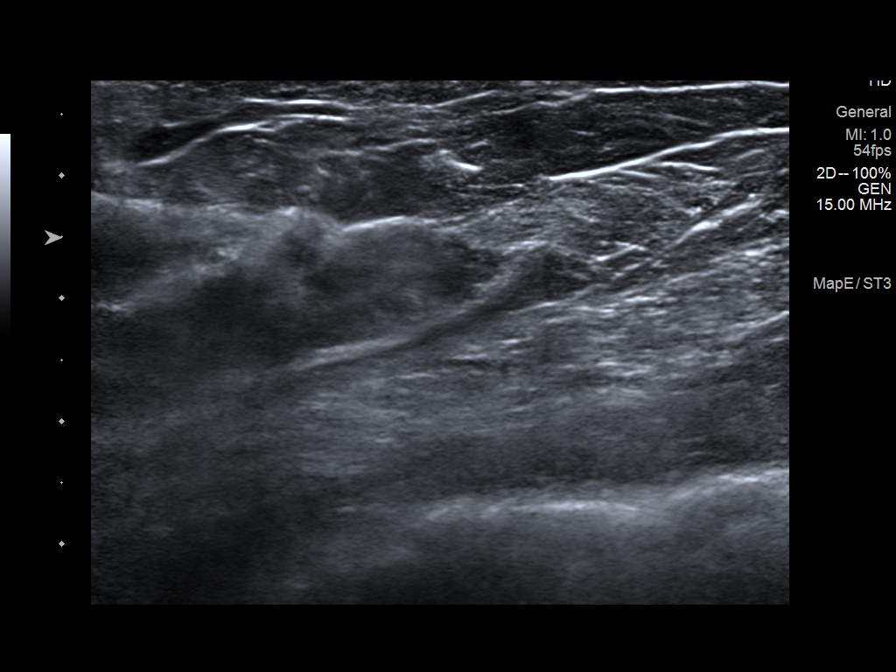
[im 5/7]
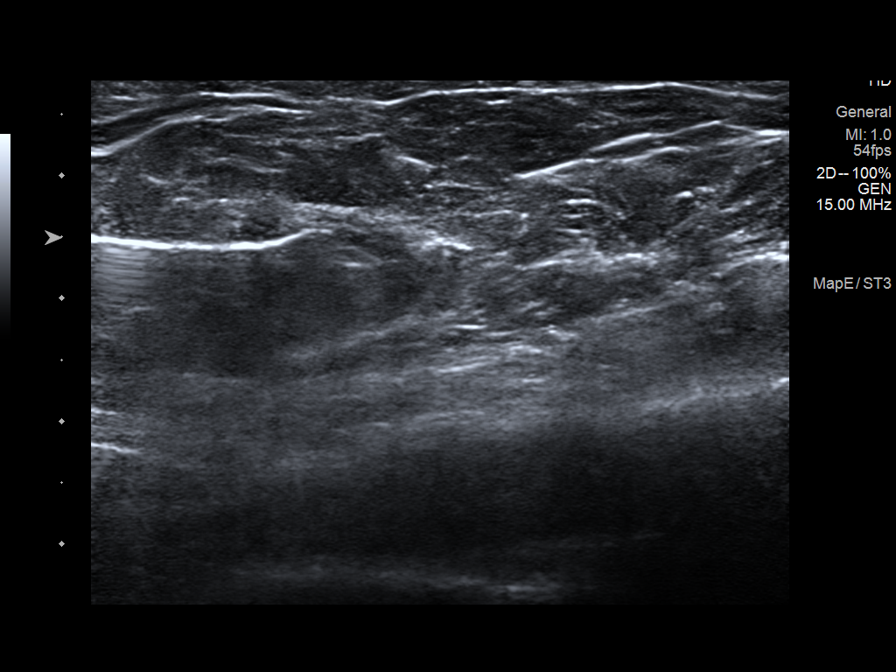
[im 6/7]
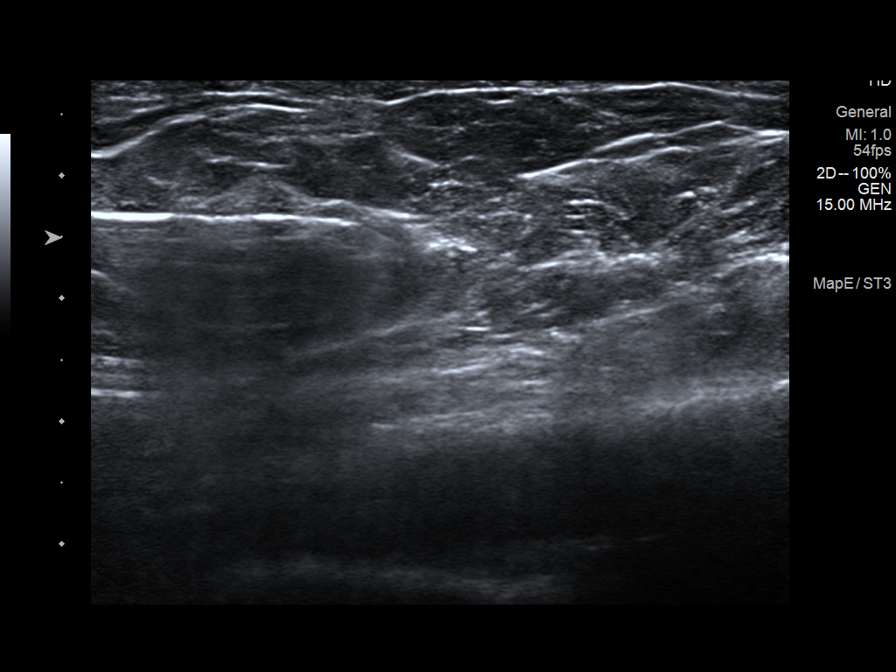
[im 7/7]
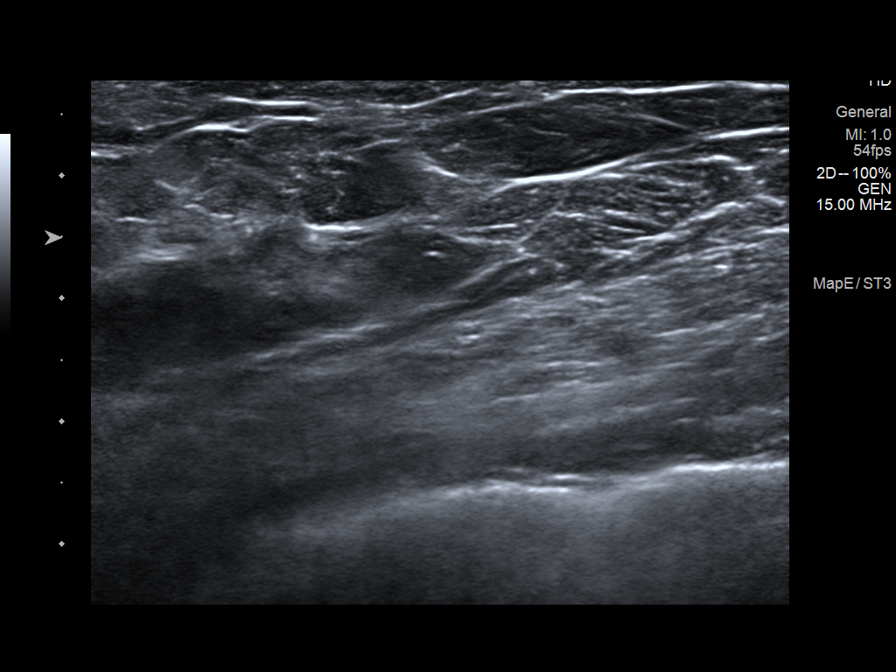

[7 of 7 positions shown; findings below may reference images not displayed]



Lesion quadrant: Upper inner quadrant

Using sterile technique and 1% Lidocaine as local anesthetic, under
direct ultrasound visualization, a 12 gauge [place] device was
used to perform biopsy of right breast mass 1 o'clock position using
a lateral approach. At the conclusion of the procedure a ribbon
shaped tissue marker clip was deployed into the biopsy cavity.
Follow up 2 view mammogram was performed and dictated separately.
IMPRESSION: Ultrasound guided biopsy of right breast mass 1 o'clock position. No
apparent complications.

## 2020-04-24 DIAGNOSIS — Z961 Presence of intraocular lens: Secondary | ICD-10-CM | POA: Diagnosis not present

## 2020-04-24 DIAGNOSIS — H353131 Nonexudative age-related macular degeneration, bilateral, early dry stage: Secondary | ICD-10-CM | POA: Diagnosis not present

## 2020-04-27 DIAGNOSIS — N8110 Cystocele, unspecified: Secondary | ICD-10-CM | POA: Diagnosis not present

## 2020-05-04 ENCOUNTER — Encounter: Payer: Medicare Other | Admitting: Family

## 2020-05-07 ENCOUNTER — Ambulatory Visit (INDEPENDENT_AMBULATORY_CARE_PROVIDER_SITE_OTHER): Payer: Medicare PPO | Admitting: Internal Medicine

## 2020-05-07 ENCOUNTER — Encounter: Payer: Self-pay | Admitting: Internal Medicine

## 2020-05-07 ENCOUNTER — Other Ambulatory Visit: Payer: Self-pay

## 2020-05-07 VITALS — BP 138/72 | HR 72 | Temp 97.5°F | Ht 65.0 in | Wt 171.2 lb

## 2020-05-07 DIAGNOSIS — M5136 Other intervertebral disc degeneration, lumbar region: Secondary | ICD-10-CM

## 2020-05-07 DIAGNOSIS — Z0000 Encounter for general adult medical examination without abnormal findings: Secondary | ICD-10-CM

## 2020-05-07 DIAGNOSIS — Z23 Encounter for immunization: Secondary | ICD-10-CM

## 2020-05-07 DIAGNOSIS — E7800 Pure hypercholesterolemia, unspecified: Secondary | ICD-10-CM | POA: Diagnosis not present

## 2020-05-07 DIAGNOSIS — M810 Age-related osteoporosis without current pathological fracture: Secondary | ICD-10-CM | POA: Diagnosis not present

## 2020-05-07 DIAGNOSIS — R739 Hyperglycemia, unspecified: Secondary | ICD-10-CM | POA: Diagnosis not present

## 2020-05-07 NOTE — Progress Notes (Signed)
Provider:  Rexene Edison. Mariea Clonts, D.O., C.M.D. Location:      Place of Service:     Previous PCP: [name], DO Patient Care Team: [name], DO as PCP - General (Geriatric Medicine) [name], MD as PCP - Cardiology (Cardiology) [name], MD as Consulting Physician (General Surgery) [name], MD as Consulting Physician (Hematology and Oncology) [name], MD as Consulting Physician (Radiation Oncology) Delice Bison, Charlestine Massed, NP as Nurse Practitioner (Hematology and Oncology)  Extended Emergency Contact Information Primary Emergency Contact: [place] Address: [address]          [place], [place] [address] [name] of [place] Phone: [phone] Mobile Phone: [phone] Relation: Daughter Secondary Emergency Contact: Sadler,[name] Address: [address]           [place], [place] [address] [place] Phone: [phone] Mobile Phone: [phone] Relation: Daughter  Code Status: DNR Goals of Care: Advanced Directive information Advanced Directives 05/07/2020  Does Patient Have a Medical Advance Directive? Yes  Type of Advance Directive Out of facility DNR (pink MOST or yellow form)  Does patient want to make changes to medical advance directive? No - Patient declined  Copy of [place] in Chart? -  Would patient like information on creating a medical advance directive? -  Pre-existing out of facility DNR order (yellow form or pink MOST form) -   Chief Complaint  Patient presents with  . Annual Exam    Annual physical exam   . Health Maintenance    Influenza     HPI: Patient is a 86 y.o. female seen today for an annual physical exam.  Had her mammogram 9/1.  Sees Dr. Lindi Adie in January.   Says she could feel a whole lot better energy wise.  Thinks it's probably the tamoxifen.  Does not have the energy she needs to do what she wants to do daily. Feels the worst first  thing.  As gets her shower, gradually feels better.  She did not have trouble when she took tamoxifen 30 yrs ago.    She has not had labs before this time.  She is taking preservision areds2.    She got her flu shot this am.  Past Medical History:  Diagnosis Date  . Asthma   . BPPV (benign paroxysmal positional vertigo)   . Compression fracture of L3 lumbar vertebra 04/06/2015  . Family history of breast cancer   . Family history of colon cancer   . Family history of pancreatic cancer   . GERD (gastroesophageal reflux disease)   . Hiatal hernia    pt states she doesnt have this  . History of adenomatous polyp of colon    tubular adenoma 2005 and  2014 tubular adenoma and  hyperplastic polyp  . History of breast cancer per pt no recurrence   dx 1992 --  s/p  left breast mastectomy and chemotherapy  . Hyperlipidemia LDL goal <100   . Osteoarthritis of right knee   . Personal history of chemotherapy 1992  . PONV (postoperative nausea and vomiting)    and "gets weak and light-leaded"  . Sensation of pressure in bladder area   . Vaginal vault prolapse    anterior  . Wears glasses   . Wears hearing aid    left only   Past Surgical History:  Procedure Laterality Date  . BREAST BIOPSY Right 04/19/2018  . BREAST  LUMPECTOMY Right 05/17/2018  . BREAST LUMPECTOMY WITH RADIOACTIVE SEED LOCALIZATION Right 05/17/2018   Procedure: RIGHT BREAST LUMPECTOMY WITH RADIOACTIVE SEED LOCALIZATION;  Surgeon: [name], MD;  Location: [facility];  Service: General;  Laterality: Right;  . CATARACT EXTRACTION W/ INTRAOCULAR LENS  IMPLANT, BILATERAL  2014  . CHOLECYSTECTOMY  1985  . COLONOSCOPY  last one 06-04-2013  . CYSTOCELE REPAIR N/A 02/29/2016   Procedure: ANTERIOR VAULT PROLAPSE REPAIR COLOPLAST [place] FIXATION AUGMENTED AXIS DERMIS REPAIR  ;  Surgeon: Carolan Clines, MD;  Location: [name];  Service: Urology;  Laterality: N/A;  . ESOPHAGOGASTRODUODENOSCOPY   08-31-2009  . LAPAROSCOPIC ASSISTED VAGINAL HYSTERECTOMY  01-10-2000   w/ Bilateral Salpingoophorectomy /  Anterior & Posterior repair/  Pubovaginal Sling  . MASTECTOMY Left 1992    reports that she quit smoking about 36 years ago. Her smoking use included cigarettes. She quit after 13.00 years of use. She has never used smokeless tobacco. She reports that she does not drink alcohol and does not use drugs.  Functional Status Survey:    Family History  Problem Relation Age of Onset  . Heart attack Mother   . Colon cancer Mother 25  . Hypertension Mother   . Heart disease Father   . Breast cancer Sister        pat half sister dx in her 50s  . Pancreatic cancer Other 43    Health Maintenance  Topic Date Due  . INFLUENZA VACCINE  03/08/2020  . TETANUS/TDAP  01/25/2025  . DEXA SCAN  Completed  . COVID-19 Vaccine  Completed  . PNA vac Low Risk Adult  Completed    Allergies  Allergen Reactions  . Codeine Nausea And Vomiting  . Morphine And Related Other (See Comments)    Nausea, and drop in blood pressure    Outpatient Encounter Medications as of 05/07/2020  Medication Sig  . acetaminophen (TYLENOL) 500 MG tablet Take 1,000 mg by mouth every morning.   . Calcium Carbonate-Vit D-Min (CALCIUM 600+D3 PLUS MINERALS) 600-800 MG-UNIT TABS Take 2 tablets by mouth daily.  . fexofenadine (ALLEGRA) 180 MG tablet Take 180 mg by mouth daily.  . metoprolol tartrate (LOPRESSOR) 25 MG tablet TAKE 1 TABLET(25 MG) BY MOUTH TWICE DAILY  . Multiple Vitamin (MULTIVITAMIN) tablet Take 1 tablet by mouth daily.  . Multiple Vitamins-Minerals (PRESERVISION AREDS 2 PO) Take by mouth.  . Omega-3 Fatty Acids (FISH OIL) 1000 MG CAPS Take 1 capsule (1,000 mg total) by mouth daily.  Marland Kitchen omeprazole (PRILOSEC) 40 MG capsule Take 1 capsule (40 mg total) by mouth 2 (two) times daily.  . Probiotic Product (PROBIOTIC FORMULA PO) Take 1 tablet by mouth every evening.   . rosuvastatin (CRESTOR) 5 MG tablet Take 1  tablet (5 mg total) by mouth 4 (four) times a week.  . tamoxifen (NOLVADEX) 20 MG tablet Take 1 tablet (20 mg total) by mouth daily.   No facility-administered encounter medications on file as of 05/07/2020.    Review of Systems  Constitutional: Positive for malaise/fatigue. Negative for chills and fever.  HENT: Positive for hearing loss. Negative for congestion and sore throat.        Bilateral hearing aids  Eyes: Negative for blurred vision.       Glasses  Respiratory: Negative for cough and shortness of breath.   Cardiovascular: Negative for chest pain, palpitations and leg swelling.  Gastrointestinal: Negative for abdominal pain, blood in stool, constipation, diarrhea and melena.  Genitourinary: Negative for dysuria.  Some leakage, but better with pessary  Musculoskeletal: Positive for back pain. Negative for falls.  Skin: Negative for itching and rash.  Neurological: Negative for dizziness and loss of consciousness.  Endo/Heme/Allergies: Bruises/bleeds easily.  Psychiatric/Behavioral: Negative for depression and memory loss. The patient is not nervous/anxious and does not have insomnia.     Vitals:   05/07/20 0807  BP: 138/72  Pulse: 72  Temp: (!) 97.5 F (36.4 C)  TempSrc: Temporal  SpO2: 98%  Weight: 171 lb 3.2 oz (77.7 kg)  Height: 5\' 5"  (1.651 m)   Body mass index is 28.49 kg/m. Physical Exam Vitals reviewed.  Constitutional:      General: She is not in acute distress.    Appearance: Normal appearance. She is not toxic-appearing.  HENT:     Head: Normocephalic and atraumatic.     Right Ear: Tympanic membrane, ear canal and external ear normal.     Left Ear: Tympanic membrane, ear canal and external ear normal.     Nose:     Comments: Nose and mouth deferred with no concerns and covid masking in place Eyes:     Extraocular Movements: Extraocular movements intact.     Conjunctiva/sclera: Conjunctivae normal.     Pupils: Pupils are equal, round, and  reactive to light.     Comments: Mild erythema medial to right eye  Cardiovascular:     Rate and Rhythm: Normal rate and regular rhythm.     Pulses: Normal pulses.     Heart sounds: Murmur heard.   Pulmonary:     Effort: Pulmonary effort is normal.     Breath sounds: Normal breath sounds. No wheezing, rhonchi or rales.  Abdominal:     General: Bowel sounds are normal. There is no distension.     Palpations: Abdomen is soft.     Tenderness: There is no abdominal tenderness. There is no guarding or rebound.  Musculoskeletal:        General: Normal range of motion.     Cervical back: Neck supple.     Right lower leg: No edema.     Left lower leg: No edema.  Skin:    General: Skin is warm and dry.     Capillary Refill: Capillary refill takes less than 2 seconds.     Comments: 1/2cm flesh-colored papule on right posterior neck  Neurological:     General: No focal deficit present.     Mental Status: She is alert and oriented to person, place, and time.     Cranial Nerves: No cranial nerve deficit.     Sensory: No sensory deficit.     Motor: No weakness.     Coordination: Coordination normal.     Gait: Gait normal.     Deep Tendon Reflexes: Reflexes abnormal.  Psychiatric:        Mood and Affect: Mood normal.        Behavior: Behavior normal.     Labs reviewed: Basic Metabolic Panel: No results for input(s): NA, K, CL, CO2, GLUCOSE, BUN, CREATININE, CALCIUM, MG, PHOS in the last 8760 hours. Liver Function Tests: No results for input(s): AST, ALT, ALKPHOS, BILITOT, PROT, ALBUMIN in the last 8760 hours. No results for input(s): LIPASE, AMYLASE in the last 8760 hours. No results for input(s): AMMONIA in the last 8760 hours. CBC: No results for input(s): WBC, NEUTROABS, HGB, HCT, MCV, PLT in the last 8760 hours. Cardiac Enzymes: No results for input(s): CKTOTAL, CKMB, CKMBINDEX, TROPONINI in the last 8760 hours.  BNP: Invalid input(s): POCBNP Lab Results  Component Value Date    HGBA1C 5.5 04/22/2019   Lab Results  Component Value Date   TSH 2.820 10/31/2016   Lab Results  Component Value Date   OMBTDHRC16 384 10/19/2017   Lab Results  Component Value Date   FOLATE >19.9 01/15/2014    Assessment/Plan 1. Annual physical exam - performed today - CBC with Differential/Platelet; Future - Basic metabolic panel; Future - Hepatic function panel; Future - Hemoglobin A1c; Future - Lipid panel; Future  2. Need for influenza vaccination - Flu Vaccine QUAD High Dose(Fluad)  3. Hyperglycemia - f/u labs - Hemoglobin A1c; Future  4. Senile osteoporosis -Continue multivitamin and her preservision, additional calcium with D and weightbearing exercise - CBC with Differential/Platelet; Future - Basic metabolic panel; Future  5. Pure hypercholesterolemia -need f/u labs in am - Lipid panel; Future  6. DDD (degenerative disc disease), lumbar -unchanged, managing ok    Labs/tests ordered:  Rebecca Guzman L. Galan Ghee, D.O. [facility] [address], [place] [address] Cell Phone (Mon-Fri 8am-5pm):  [phone] On Call:  [phone] & follow prompts after 5pm & weekends Office Phone:  [phone] Office Fax:  [phone]

## 2020-05-08 ENCOUNTER — Ambulatory Visit (INDEPENDENT_AMBULATORY_CARE_PROVIDER_SITE_OTHER): Payer: Medicare PPO | Admitting: Family

## 2020-05-08 ENCOUNTER — Encounter: Payer: Self-pay | Admitting: Family

## 2020-05-08 ENCOUNTER — Other Ambulatory Visit: Payer: Medicare PPO

## 2020-05-08 VITALS — BP 164/96 | HR 77 | Temp 96.6°F | Resp 16 | Ht 65.0 in | Wt 170.6 lb

## 2020-05-08 DIAGNOSIS — Z0000 Encounter for general adult medical examination without abnormal findings: Secondary | ICD-10-CM

## 2020-05-08 DIAGNOSIS — M810 Age-related osteoporosis without current pathological fracture: Secondary | ICD-10-CM

## 2020-05-08 DIAGNOSIS — R739 Hyperglycemia, unspecified: Secondary | ICD-10-CM

## 2020-05-08 DIAGNOSIS — E7800 Pure hypercholesterolemia, unspecified: Secondary | ICD-10-CM | POA: Diagnosis not present

## 2020-05-08 NOTE — Patient Instructions (Signed)
Rebecca Guzman , Thank you for taking time to come for your Medicare Wellness Visit. I appreciate your ongoing commitment to your health goals. Please review the following plan we discussed and let me know if I can assist you in the future.   Screening recommendations/referrals: Colonoscopy: N/A  Mammogram: up date  Bone Density: up date  Recommended yearly ophthalmology/optometry visit for glaucoma screening and checkup Recommended yearly dental visit for hygiene and checkup  Vaccinations: Influenza vaccine: up date  Pneumococcal vaccine : up date  Tdap vaccine : up date  Shingles vaccine  : up date  Advanced directives:Yes  Conditions/risks identified: Advance age female > 1 yrs,Hx of premature cardiovascular disease,Hx of smoking     Next appointment: 1 year    Preventive Care 84 Years and Older, Female Preventive care refers to lifestyle choices and visits with your health care provider that can promote health and wellness. What does preventive care include?  A yearly physical exam. This is also called an annual well check.  Dental exams once or twice a year.  Routine eye exams. Ask your health care provider how often you should have your eyes checked.  Personal lifestyle choices, including:  Daily care of your teeth and gums.  Regular physical activity.  Eating a healthy diet.  Avoiding tobacco and drug use.  Limiting alcohol use.  Practicing safe sex.  Taking low-dose aspirin every day.  Taking vitamin and mineral supplements as recommended by your health care provider. What happens during an annual well check? The services and screenings done by your health care provider during your annual well check will depend on your age, overall health, lifestyle risk factors, and family history of disease. Counseling  Your health care provider may ask you questions about your:  Alcohol use.  Tobacco use.  Drug use.  Emotional well-being.  Home and relationship  well-being.  Sexual activity.  Eating habits.  History of falls.  Memory and ability to understand (cognition).  Work and work Statistician.  Reproductive health. Screening  You may have the following tests or measurements:  Height, weight, and BMI.  Blood pressure.  Lipid and cholesterol levels. These may be checked every 5 years, or more frequently if you are over 30 years old.  Skin check.  Lung cancer screening. You may have this screening every year starting at age 84 if you have a 30-pack-year history of smoking and currently smoke or have quit within the past 15 years.  Fecal occult blood test (FOBT) of the stool. You may have this test every year starting at age 84.  Flexible sigmoidoscopy or colonoscopy. You may have a sigmoidoscopy every 5 years or a colonoscopy every 10 years starting at age 84.  Hepatitis C blood test.  Hepatitis B blood test.  Sexually transmitted disease (STD) testing.  Diabetes screening. This is done by checking your blood sugar (glucose) after you have not eaten for a while (fasting). You may have this done every 1-3 years.  Bone density scan. This is done to screen for osteoporosis. You may have this done starting at age 84.  Mammogram. This may be done every 1-2 years. Talk to your health care provider about how often you should have regular mammograms. Talk with your health care provider about your test results, treatment options, and if necessary, the need for more tests. Vaccines  Your health care provider may recommend certain vaccines, such as:  Influenza vaccine. This is recommended every year.  Tetanus, diphtheria, and acellular pertussis (  Tdap, Td) vaccine. You may need a Td booster every 10 years.  Zoster vaccine. You may need this after age 84.  Pneumococcal 13-valent conjugate (PCV13) vaccine. One dose is recommended after age 22.  Pneumococcal polysaccharide (PPSV23) vaccine. One dose is recommended after age  51. Talk to your health care provider about which screenings and vaccines you need and how often you need them. This information is not intended to replace advice given to you by your health care provider. Make sure you discuss any questions you have with your health care provider. Document Released: 08/21/2015 Document Revised: 04/13/2016 Document Reviewed: 05/26/2015 Elsevier Interactive Patient Education  2017 [place] Prevention in the Home Falls can cause injuries. They can happen to people of all ages. There are many things you can do to make your home safe and to help prevent falls. What can I do on the outside of my home?  Regularly fix the edges of walkways and driveways and fix any cracks.  Remove anything that might make you trip as you walk through a door, such as a raised step or threshold.  Trim any bushes or trees on the path to your home.  Use bright outdoor lighting.  Clear any walking paths of anything that might make someone trip, such as rocks or tools.  Regularly check to see if handrails are loose or broken. Make sure that both sides of any steps have handrails.  Any raised decks and porches should have guardrails on the edges.  Have any leaves, snow, or ice cleared regularly.  Use sand or salt on walking paths during winter.  Clean up any spills in your garage right away. This includes oil or grease spills. What can I do in the bathroom?  Use night lights.  Install grab bars by the toilet and in the tub and shower. Do not use towel bars as grab bars.  Use non-skid mats or decals in the tub or shower.  If you need to sit down in the shower, use a plastic, non-slip stool.  Keep the floor dry. Clean up any water that spills on the floor as soon as it happens.  Remove soap buildup in the tub or shower regularly.  Attach bath mats securely with double-sided non-slip rug tape.  Do not have throw rugs and other things on the floor that can make  you trip. What can I do in the bedroom?  Use night lights.  Make sure that you have a light by your bed that is easy to reach.  Do not use any sheets or blankets that are too big for your bed. They should not hang down onto the floor.  Have a firm chair that has side arms. You can use this for support while you get dressed.  Do not have throw rugs and other things on the floor that can make you trip. What can I do in the kitchen?  Clean up any spills right away.  Avoid walking on wet floors.  Keep items that you use a lot in easy-to-reach places.  If you need to reach something above you, use a strong step stool that has a grab bar.  Keep electrical cords out of the way.  Do not use floor polish or wax that makes floors slippery. If you must use wax, use non-skid floor wax.  Do not have throw rugs and other things on the floor that can make you trip. What can I do with my stairs?  Do  not leave any items on the stairs.  Make sure that there are handrails on both sides of the stairs and use them. Fix handrails that are broken or loose. Make sure that handrails are as long as the stairways.  Check any carpeting to make sure that it is firmly attached to the stairs. Fix any carpet that is loose or worn.  Avoid having throw rugs at the top or bottom of the stairs. If you do have throw rugs, attach them to the floor with carpet tape.  Make sure that you have a light switch at the top of the stairs and the bottom of the stairs. If you do not have them, ask someone to add them for you. What else can I do to help prevent falls?  Wear shoes that:  Do not have high heels.  Have rubber bottoms.  Are comfortable and fit you well.  Are closed at the toe. Do not wear sandals.  If you use a stepladder:  Make sure that it is fully opened. Do not climb a closed stepladder.  Make sure that both sides of the stepladder are locked into place.  Ask someone to hold it for you, if  possible.  Clearly mark and make sure that you can see:  Any grab bars or handrails.  First and last steps.  Where the edge of each step is.  Use tools that help you move around (mobility aids) if they are needed. These include:  Canes.  Walkers.  Scooters.  Crutches.  Turn on the lights when you go into a dark area. Replace any light bulbs as soon as they burn out.  Set up your furniture so you have a clear path. Avoid moving your furniture around.  If any of your floors are uneven, fix them.  If there are any pets around you, be aware of where they are.  Review your medicines with your doctor. Some medicines can make you feel dizzy. This can increase your chance of falling. Ask your doctor what other things that you can do to help prevent falls. This information is not intended to replace advice given to you by your health care provider. Make sure you discuss any questions you have with your health care provider. Document Released: 05/21/2009 Document Revised: 12/31/2015 Document Reviewed: 08/29/2014 Elsevier Interactive Patient Education  2017 [employer].

## 2020-05-08 NOTE — Progress Notes (Signed)
Subjective:   Rebecca Guzman is a 86 y.o. female who presents for Medicare Annual (Subsequent) preventive examination.  Review of Systems     Cardiac Risk Factors include: advanced age (>31men, >70 women);family history of premature cardiovascular disease;smoking/ tobacco exposure     Objective:    Today's Vitals   05/08/20 0839  BP: (!) 164/96  Pulse: 77  Resp: 16  Temp: (!) 96.6 F (35.9 C)  SpO2: 98%  Weight: 170 lb 9.6 oz (77.4 kg)  Height: 5\' 5"  (1.651 m)   Body mass index is 28.39 kg/m.  Advanced Directives 05/07/2020 10/31/2019 05/03/2019 05/03/2019 05/10/2018 04/30/2018 10/23/2017  Does Patient Have a Medical Advance Directive? Yes Yes Yes Yes Yes Yes Yes  Type of Advance Directive Out of facility DNR (pink MOST or yellow form) [place];Living will;Out of facility DNR (pink MOST or yellow form) Out of facility DNR (pink MOST or yellow form);Healthcare Power of [employer] of facility DNR (pink MOST or yellow form) - Press photographer;Living will;Out of facility DNR (pink MOST or yellow form) [place];Out of facility DNR (pink MOST or yellow form)  Does patient want to make changes to medical advance directive? No - Patient declined No - Patient declined No - Patient declined No - Patient declined - No - Patient declined No - Patient declined  Copy of [place] in Chart? - Yes - validated most recent copy scanned in chart (See row information) Yes - validated most recent copy scanned in chart (See row information) - - No - copy requested Yes  Would patient like information on creating a medical advance directive? - - - - - - -  Pre-existing out of facility DNR order (yellow form or pink MOST form) - Pink MOST/Yellow Form most recent copy in chart - Physician notified to receive inpatient order - Pink MOST form placed in chart (order not valid for inpatient use) - Yellow form placed in chart (order not valid for  inpatient use) Yellow form placed in chart (order not valid for inpatient use)    Current Medications (verified) Outpatient Encounter Medications as of 05/08/2020  Medication Sig   acetaminophen (TYLENOL) 500 MG tablet Take 1,000 mg by mouth every morning.    Calcium Carbonate-Vit D-Min (CALCIUM 600+D3 PLUS MINERALS) 600-800 MG-UNIT TABS Take 2 tablets by mouth daily.   fexofenadine (ALLEGRA) 180 MG tablet Take 180 mg by mouth daily.   metoprolol tartrate (LOPRESSOR) 25 MG tablet TAKE 1 TABLET(25 MG) BY MOUTH TWICE DAILY   Multiple Vitamin (MULTIVITAMIN) tablet Take 1 tablet by mouth daily.   Multiple Vitamins-Minerals (PRESERVISION AREDS 2 PO) Take by mouth.   Omega-3 Fatty Acids (FISH OIL) 1000 MG CAPS Take 1 capsule (1,000 mg total) by mouth daily.   omeprazole (PRILOSEC) 40 MG capsule Take 1 capsule (40 mg total) by mouth 2 (two) times daily.   Probiotic Product (PROBIOTIC FORMULA PO) Take 1 tablet by mouth every evening.    rosuvastatin (CRESTOR) 5 MG tablet Take 1 tablet (5 mg total) by mouth 4 (four) times a week.   tamoxifen (NOLVADEX) 20 MG tablet Take 1 tablet (20 mg total) by mouth daily.   No facility-administered encounter medications on file as of 05/08/2020.    Allergies (verified) Codeine and Morphine and related   History: Past Medical History:  Diagnosis Date   Asthma    BPPV (benign paroxysmal positional vertigo)    Compression fracture of L3 lumbar vertebra 04/06/2015  Family history of breast cancer    Family history of colon cancer    Family history of pancreatic cancer    GERD (gastroesophageal reflux disease)    Hiatal hernia    pt states she doesnt have this   History of adenomatous polyp of colon    tubular adenoma 2005 and  2014 tubular adenoma and  hyperplastic polyp   History of breast cancer per pt no recurrence   dx 1992 --  s/p  left breast mastectomy and chemotherapy   Hyperlipidemia LDL goal <100    Osteoarthritis of  right knee    Personal history of chemotherapy 1992   PONV (postoperative nausea and vomiting)    and "gets weak and light-leaded"   Sensation of pressure in bladder area    Vaginal vault prolapse    anterior   Wears glasses    Wears hearing aid    left only   Past Surgical History:  Procedure Laterality Date   BREAST BIOPSY Right 04/19/2018   BREAST LUMPECTOMY Right 05/17/2018   BREAST LUMPECTOMY WITH RADIOACTIVE SEED LOCALIZATION Right 05/17/2018   Procedure: RIGHT BREAST LUMPECTOMY WITH RADIOACTIVE SEED LOCALIZATION;  Surgeon: [name], MD;  Location: [facility];  Service: General;  Laterality: Right;   CATARACT EXTRACTION W/ INTRAOCULAR LENS  IMPLANT, BILATERAL  2014   CHOLECYSTECTOMY  1985   COLONOSCOPY  last one 06-04-2013   CYSTOCELE REPAIR N/A 02/29/2016   Procedure: ANTERIOR VAULT PROLAPSE REPAIR COLOPLAST [place]  ;  Surgeon: Carolan Clines, MD;  Location: [facility];  Service: Urology;  Laterality: N/A;   ESOPHAGOGASTRODUODENOSCOPY  08-31-2009   LAPAROSCOPIC ASSISTED VAGINAL HYSTERECTOMY  01-10-2000   w/ Bilateral Salpingoophorectomy /  Anterior & Posterior repair/  Pubovaginal Sling   MASTECTOMY Left 1992   Family History  Problem Relation Age of Onset   Heart attack Mother    Colon cancer Mother 40   Hypertension Mother    Heart disease Father    Breast cancer Sister        pat half sister dx in her 13s   Pancreatic cancer Other 56   Social History   Socioeconomic History   Marital status: Widowed    Spouse name: Not on file   Number of children: 4   Years of education: Not on file   Highest education level: Not on file  Occupational History   Occupation: Retired Product manager: RETIRED  Tobacco Use   Smoking status: Former Smoker    Years: 13.00    Types: Cigarettes    Quit date: 10/12/1983    Years since quitting: 36.5   Smokeless tobacco: Never Used    Vaping Use   Vaping Use: Never used  Substance and Sexual Activity   Alcohol use: No   Drug use: No   Sexual activity: Not on file  Other Topics Concern   Not on file  Social History Narrative   Not on file   Social Determinants of Health   Financial Resource Strain:    Difficulty of Paying Living Expenses: Not on file  Food Insecurity:    Worried About Charity fundraiser in the Last Year: Not on file   [place] in the Last Year: Not on file  Transportation Needs:    Lack of Transportation (Medical): Not on file   Lack of Transportation (Non-Medical): Not on file  Physical Activity:    Days of Exercise per Week: Not  on file   Minutes of Exercise per Session: Not on file  Stress:    Feeling of Stress : Not on file  Social Connections:    Frequency of Communication with Friends and Family: Not on file   Frequency of Social Gatherings with Friends and Family: Not on file   Attends Religious Services: Not on file   Active Member of Clubs or Organizations: Not on file   Attends Archivist Meetings: Not on file   Marital Status: Not on file    Tobacco Counseling Counseling given: Not Answered   Clinical Intake:  Pre-visit preparation completed: No  Pain : 0-10 Pain Score:  (10 sometimes when running the vaccum) Pain Type: Chronic pain Pain Location: Back (right knee) Pain Orientation: Right, Lower (scoliosis) Pain Radiating Towards: No Pain Descriptors / Indicators: Aching Pain Onset:  (several years) Pain Frequency: Constant Pain Relieving Factors: Heating pad,Tylenol Effect of Pain on Daily Activities: yes working  Pain Relieving Factors: Heating pad,Tylenol  BMI - recorded: 28.76 Nutritional Status: BMI 25 -29 Overweight Nutritional Risks: None Diabetes: No  How often do you need to have someone help you when you read instructions, pamphlets, or other written materials from your doctor or pharmacy?: 1 - Never What is  the last grade level you completed in school?: 1 year college  Diabetic?No   Interpreter Needed?: No  Information entered by :: Arley Salamone FNP-C   Activities of Daily Living In your present state of health, do you have any difficulty performing the following activities: 05/08/2020  Hearing? Y  Comment wears hearing aids  Vision? N  Comment Follows up with Opthalmologist  Difficulty concentrating or making decisions? Y  Comment remembering  Walking or climbing stairs? N  Dressing or bathing? N  Doing errands, shopping? N  Preparing Food and eating ? N  Using the Toilet? N  In the past six months, have you accidently leaked urine? Y  Comment uses a pasry changed every 3 months  Do you have problems with loss of bowel control? N  Managing your Medications? N  Managing your Finances? N  Housekeeping or managing your Housekeeping? N  Some recent data might be hidden    Patient Care Team: [name], DO as PCP - General (Geriatric Medicine) [name], MD as PCP - Cardiology (Cardiology) [name], MD as Consulting Physician (General Surgery) [name], MD as Consulting Physician (Hematology and Oncology) [name], MD as Consulting Physician (Radiation Oncology) Delice Bison Charlestine Massed, NP as Nurse Practitioner (Hematology and Oncology)  Indicate any recent Medical Services you may have received from other than [facility] providers in the past year (date may be approximate).     Assessment:   This is a routine wellness examination for Abigal.  Hearing/Vision screen  Hearing Screening   125Hz  250Hz  500Hz  1000Hz  2000Hz  3000Hz  4000Hz  6000Hz  8000Hz   Right ear:           Left ear:           Comments: No Hearing Concerns. Patient wears a hearing aid in both ears.   Vision Screening Comments: No Vision Concerns. Patient wears prescription glasses. Patient last eye exam was September 2021.  Dietary issues and exercise activities discussed: Current  Exercise Habits: Home exercise routine, Type of exercise: walking, Time (Minutes): 25, Frequency (Times/Week): 3, Weekly Exercise (Minutes/Week): 75, Intensity: Mild, Exercise limited by: None identified  Goals     Increase water intake     Patient will increase intake.  Patient Stated     I would like to walk daily for 25 minutes       Depression Screen PHQ 2/9 Scores 05/08/2020 10/31/2019 05/03/2019 04/30/2018 10/23/2017 04/03/2017 09/29/2016  PHQ - 2 Score 0 0 0 0 0 0 0    Fall Risk Fall Risk  05/08/2020 05/07/2020 10/31/2019 05/03/2019 04/30/2018  Falls in the past year? 0 0 0 0 No  Number falls in past yr: 0 0 0 0 -  Injury with Fall? 0 - 0 0 -    Any stairs in or around the home? Yes  If so, are there any without handrails? Yes  Home free of loose throw rugs in walkways, pet beds, electrical cords, etc? No  Adequate lighting in your home to reduce risk of falls? Yes   ASSISTIVE DEVICES UTILIZED TO PREVENT FALLS:  Life alert? No  Use of a cane, walker or w/c? No  Grab bars in the bathroom? Yes  Shower chair or bench in shower? Yes  Elevated toilet seat or a handicapped toilet? No   TIMED UP AND GO:  Was the test performed? Yes .  Length of time to ambulate 10 feet: 15 sec.   Gait steady and fast without use of assistive device  Cognitive Function: MMSE - Mini Mental State Exam 05/08/2020 04/30/2018 04/03/2017 01/28/2016  Orientation to time 5 5 5 5   Orientation to Place 5 5 5 5   Registration 3 3 3 3   Attention/ Calculation 5 5 5 4   Recall 3 2 3 3   Language- name 2 objects 2 2 2 2   Language- repeat 1 1 1 1   Language- follow 3 step command 3 3 3 3   Language- read & follow direction 0 1 1 1   Language-read & follow direction-comments Patient read "Close your eyes" inside her head. But didn't read out loud. - - -  Write a sentence 1 1 1 1   Copy design 0 1 1 1   Copy design-comments Object drawn by patient has more than 10+ sides. (18 sides ) - - -  Total score 28 29 30 29       6CIT Screen 05/03/2019  What Year? 0 points  What month? 0 points  What time? 0 points  Count back from 20 0 points  Months in reverse 0 points  Repeat phrase 0 points  Total Score 0    Immunizations Immunization History  Administered Date(s) Administered   Fluad Quad(high Dose 65+) 05/02/2019, 05/07/2020   Influenza, High Dose Seasonal PF 04/03/2017, 04/30/2018   Influenza, Quadrivalent, Recombinant, Inj, Pf 03/25/2016   Influenza,inj,Quad PF,6+ Mos 04/11/2013   Influenza-Unspecified 08/09/2011, 05/30/2014   PFIZER SARS-COV-2 Vaccination 08/22/2019, 09/12/2019   Pneumococcal Conjugate-13 07/11/2013   Pneumococcal Polysaccharide-23 07/25/2014   Tdap 01/26/2015   Zoster 08/08/2010   Zoster Recombinat (Shingrix) 04/28/2017, 10/20/2017    TDAP status: Up to date Flu Vaccine status: Up to date Pneumococcal vaccine status: Up to date Covid-19 vaccine status: Completed vaccines  Qualifies for Shingles Vaccine? Yes   Zostavax completed Yes   Shingrix Completed?: Yes  Screening Tests Health Maintenance  Topic Date Due   TETANUS/TDAP  01/25/2025   INFLUENZA VACCINE  Completed   DEXA SCAN  Completed   COVID-19 Vaccine  Completed   PNA vac Low Risk Adult  Completed    Health Maintenance  There are no preventive care reminders to display for this patient.  Colorectal cancer screening: No longer required.  Mammogram status: Completed 04/08/2020 . Repeat every year Bone Density  status: Completed 06/07/2018 . Results reflect: Bone density results: OSTEOPENIA. Repeat every 2 years.  Lung Cancer Screening: (Low Dose CT Chest recommended if Age 48-80 years, 30 pack-year currently smoking OR have quit w/in 15years.) does not qualify.   Lung Cancer Screening Referral: No   Additional Screening:  Hepatitis C Screening: does not qualify; Completed No   Vision Screening: Recommended annual ophthalmology exams for early detection of glaucoma and other  disorders of the eye. Is the patient up to date with their annual eye exam?  Yes  Who is the provider or what is the name of the office in which the patient attends annual eye exams? [facility]  If pt is not established with a provider, would they like to be referred to a provider to establish care? No .   Dental Screening: Recommended annual dental exams for proper oral hygiene  Community Resource Referral / Chronic Care Management: CRR required this visit?  No   CCM required this visit?  No      Plan:     I have personally reviewed and noted the following in the patients chart:    Medical and social history  Use of alcohol, tobacco or illicit drugs   Current medications and supplements  Functional ability and status  Nutritional status  Physical activity  Advanced directives  List of other physicians  Hospitalizations, surgeries, and ER visits in previous 12 months  Vitals  Screenings to include cognitive, depression, and falls  Referrals and appointments  In addition, I have reviewed and discussed with patient certain preventive protocols, quality metrics, and best practice recommendations. A written personalized care plan for preventive services as well as general preventive health recommendations were provided to patient.     Sandrea Hughs, NP   05/08/2020   Nurse Notes:Has coming appointment for Dexa scan 06/08/2020

## 2020-05-09 LAB — HEPATIC FUNCTION PANEL
AG Ratio: 1.8 (calc) (ref 1.0–2.5)
ALT: 12 U/L (ref 6–29)
AST: 15 U/L (ref 10–35)
Albumin: 4.2 g/dL (ref 3.6–5.1)
Alkaline phosphatase (APISO): 43 U/L (ref 37–153)
Bilirubin, Direct: 0.1 mg/dL (ref 0.0–0.2)
Globulin: 2.3 g/dL (calc) (ref 1.9–3.7)
Indirect Bilirubin: 0.3 mg/dL (calc) (ref 0.2–1.2)
Total Bilirubin: 0.4 mg/dL (ref 0.2–1.2)
Total Protein: 6.5 g/dL (ref 6.1–8.1)

## 2020-05-09 LAB — BASIC METABOLIC PANEL
BUN: 17 mg/dL (ref 7–25)
CO2: 27 mmol/L (ref 20–32)
Calcium: 9.4 mg/dL (ref 8.6–10.4)
Chloride: 105 mmol/L (ref 98–110)
Creat: 0.77 mg/dL (ref 0.60–0.88)
Glucose, Bld: 94 mg/dL (ref 65–99)
Potassium: 4.2 mmol/L (ref 3.5–5.3)
Sodium: 140 mmol/L (ref 135–146)

## 2020-05-09 LAB — LIPID PANEL
Cholesterol: 162 mg/dL (ref <200)
HDL: 63 mg/dL (ref >=50)
LDL Cholesterol (Calc): 76 mg/dL (calc)
Non-HDL Cholesterol (Calc): 99 mg/dL (calc) (ref <130)
Total CHOL/HDL Ratio: 2.6 (calc) (ref <5.0)
Triglycerides: 145 mg/dL (ref <150)

## 2020-05-09 LAB — CBC WITH DIFFERENTIAL/PLATELET
Absolute Monocytes: 436 cells/uL (ref 200–950)
Basophils Absolute: 20 cells/uL (ref 0–200)
Basophils Relative: 0.3 %
Eosinophils Absolute: 181 cells/uL (ref 15–500)
Eosinophils Relative: 2.7 %
HCT: 39.1 % (ref 35.0–45.0)
Hemoglobin: 13 g/dL (ref 11.7–15.5)
Lymphs Abs: 1615 cells/uL (ref 850–3900)
MCH: 31.3 pg (ref 27.0–33.0)
MCHC: 33.2 g/dL (ref 32.0–36.0)
MCV: 94.2 fL (ref 80.0–100.0)
MPV: 11.4 fL (ref 7.5–12.5)
Monocytes Relative: 6.5 %
Neutro Abs: 4449 cells/uL (ref 1500–7800)
Neutrophils Relative %: 66.4 %
Platelets: 195 10*3/uL (ref 140–400)
RBC: 4.15 10*6/uL (ref 3.80–5.10)
RDW: 12.5 % (ref 11.0–15.0)
Total Lymphocyte: 24.1 %
WBC: 6.7 10*3/uL (ref 3.8–10.8)

## 2020-05-09 LAB — HEMOGLOBIN A1C
Hgb A1c MFr Bld: 5.6 % of total Hgb (ref <5.7)
Mean Plasma Glucose: 114 (calc)
eAG (mmol/L): 6.3 (calc)

## 2020-05-14 ENCOUNTER — Other Ambulatory Visit: Payer: Self-pay | Admitting: Internal Medicine

## 2020-05-14 DIAGNOSIS — R1013 Epigastric pain: Secondary | ICD-10-CM

## 2020-05-14 DIAGNOSIS — R079 Chest pain, unspecified: Secondary | ICD-10-CM

## 2020-05-14 DIAGNOSIS — K219 Gastro-esophageal reflux disease without esophagitis: Secondary | ICD-10-CM

## 2020-05-14 NOTE — Telephone Encounter (Signed)
Last ordered 05/2019 however patient was just seen on 05/07/20. rx sent to pharmacy by e-script

## 2020-06-05 ENCOUNTER — Encounter: Payer: Self-pay | Admitting: Orthopaedic Surgery

## 2020-06-05 ENCOUNTER — Ambulatory Visit (INDEPENDENT_AMBULATORY_CARE_PROVIDER_SITE_OTHER): Payer: Medicare PPO

## 2020-06-05 ENCOUNTER — Ambulatory Visit: Payer: Medicare PPO | Admitting: Orthopaedic Surgery

## 2020-06-05 VITALS — BP 133/74 | HR 78 | Ht 65.0 in | Wt 165.0 lb

## 2020-06-05 DIAGNOSIS — M545 Low back pain, unspecified: Secondary | ICD-10-CM

## 2020-06-05 DIAGNOSIS — G8929 Other chronic pain: Secondary | ICD-10-CM

## 2020-06-05 DIAGNOSIS — M25561 Pain in right knee: Secondary | ICD-10-CM

## 2020-06-05 DIAGNOSIS — M1711 Unilateral primary osteoarthritis, right knee: Secondary | ICD-10-CM | POA: Diagnosis not present

## 2020-06-05 NOTE — Progress Notes (Signed)
Office Visit Note   Patient: Rebecca Guzman           Date of Birth: 1934/05/06           MRN: [id number] Visit Date: 06/05/2020              Requested by: Rebecca Curry, DO [place],  [place] [address] PCP: Rebecca Curry, DO   Assessment & Plan: Visit Diagnoses:  1. Chronic right-sided low back pain, unspecified whether sciatica present   2. Chronic pain of right knee   3. Primary osteoarthritis of right knee     Plan: Patient has known osteoporosis.  She has been on biphosphonate treatment for many years.  Currently she is on Prolia.  I discussed with the recommendations for cycling biphosphonate's taking in 18 months and skipping 6 months to allow time for bone remodeling to improve bone architecture.  Currently she is on tamoxifen and will we again discussed problems with that treatment with bone loss problems etc.  I discussed that there improved bone architecture if she will skip 1 cycle of biphosphonate treatment for 6 months and then repeat her bone density test.  Intra-articular injection performed in her knee today with improvement in her pain in her knee.  She can follow-up with me she has persistent problems.  We discussed the arthritis in her knee severe enough that knee arthroplasty would be the surgical treatment but hopefully should get some relief with the injection.  Follow-Up Instructions: Return if symptoms worsen or fail to improve.   Orders:  Orders Placed This Encounter  Procedures  . Large Joint Inj: R knee  . XR Knee 1-2 Views Right  . XR Lumbar Spine 2-3 Views   No orders of the defined types were placed in this encounter.     Procedures: Large Joint Inj: R knee on 06/05/2020 3:54 PM Indications: pain and joint swelling Details: 22 G 1.5 in needle, anterolateral approach  Arthrogram: No  Medications: 40 mg methylPREDNISolone acetate 40 MG/ML; 0.5 mL lidocaine 1 %; 4 mL bupivacaine 0.25 % Outcome: tolerated well, no immediate  complications Procedure, treatment alternatives, risks and benefits explained, specific risks discussed. Consent was given by the patient. Immediately prior to procedure a time out was called to verify the correct patient, procedure, equipment, support staff and site/side marked as required. Patient was prepped and draped in the usual sterile fashion.       Clinical Data: No additional findings.   Subjective: Chief Complaint  Patient presents with  . Right Knee - Pain  . Lower Back - Pain    HPI 86 year old female returns with continued problems with back symptoms primarily on the right side.  She is also having problems with her right knee with chronic knee pain which is gradually getting worse.  Knee wakes her up at night she has trouble walking at times.  When she gets it moving she states it feels somewhat better.  Gives way on her and associated with swelling with pain medially and posterior medial.  She is used a knee brace which tends to help.  To try doing a little bit of walking daily to help her knee and states it actually made it worse.  Patient's another bout of breast cancer had at biopsy done and currently is on tamoxifen.  She is used Tylenol extra strength with some relief.  Review of Systems   Objective: Vital Signs: BP 133/74   Pulse 78  Ht 5\' 5"  (1.651 m)   Wt 165 lb (74.8 kg)   LMP 11/21/1985   BMI 27.46 kg/m   Physical Exam  Ortho Exam  Specialty Comments:  No specialty comments available.  Imaging: DG Bone Density  Result Date: 06/08/2020 EXAM: DUAL X-RAY ABSORPTIOMETRY (DXA) FOR BONE MINERAL DENSITY IMPRESSION: Referring Physician:  Nicholas Guzman Your patient completed a BMD test using Lunar IDXA DXA system ( analysis version: 16 ) manufactured by [employer]. Technologist: AW PATIENT: Name: Rebecca Guzman Patient ID: [id number] Birth Date: 1934-07-22 Height: 64.0 in. Sex: Female Measured: 06/08/2020 Weight: 171.2 lbs. Indications: Advanced Age,  Bilateral Ovariectomy (65.51), Breast Cancer History, Caucasian, Estrogen Deficient, History of Osteopenia, Hysterectomy, Omeprazole, Postmenopausal, Tamoxifen Fractures: vertebrae Treatments: Calcium (E943.0), Hormone Therapy For Cancer, Prolia, Vitamin D (E933.5) ASSESSMENT: The BMD measured at Forearm Radius 33% is 0.717 g/cm2 with a T-score of -1.9. This patient is considered OSTEOPENIC according to [facility]) criteria. The scan quality is good. Lumbar spine was not utilized due to advanced degenerative changes. Site Region Measured Date Measured Age YA T-score BMD Significant CHANGE Left Forearm Radius 33% 06/08/[age] 84.7 -1.11 0.717 g/cm2 Left Forearm Radius 33% 06/07/2018 82.7 -2.1 0.705 g/cm2 DualFemur Neck Right 06/08/2020 84.7 -1.5 0.853 g/cm2 DualFemur Neck Right 06/07/2018 82.7 -1.3 0.858 g/cm2 DualFemur Total Mean 06/08/2020 84.7 -0.8 0.906 g/cm2 DualFemur Total Mean 06/07/2018 82.7 -0.9 0.889 g/cm2 World Health Organization [facility]) criteria for post-menopausal, Caucasian Women: Normal       T-score at or above -1 SD Osteopenia   T-score between -1 and -2.5 SD Osteoporosis T-score at or below -2.5 SD RECOMMENDATION: 1. All patients should optimize calcium and vitamin D intake. 2. Consider FDA approved medical therapies in postmenopausal women and men aged 72 years and older, based on the following: a. A hip or vertebral (clinical or morphometric) fracture b. T-score < or = -2.5 at the femoral neck or spine after appropriate evaluation to exclude secondary causes c. Low bone mass (T-score between -1.0 and -2.5 at the femoral neck or spine) and a 10 year probability of a hip fracture > or = 3% or a 10 year probability of a major osteoporosis-related fracture > or = 20% based on the [place]-adapted WHO algorithm d. Clinician judgment and/or patient preferences may indicate treatment for people with 10-year fracture probabilities above or below these levels FOLLOW-UP: People with diagnosed  cases of osteoporosis or at high risk for fracture should have regular bone mineral density tests. For patients eligible for Medicare, routine testing is allowed once every 2 years. The testing frequency can be increased to one year for patients who have rapidly progressing disease, those who are receiving or discontinuing medical therapy to restore bone mass, or have additional risk factors. I have reviewed this report and agree with the above findings. Sherea Liptak A. Thornton Papas, M.D. [facility] Radiology Electronically Signed   By: Lavonia Dana M.D.   On: 06/08/2020 12:54     PMFS History: Patient Active Problem List   Diagnosis Date Noted  . Pure hypercholesterolemia 05/02/2019  . Genetic testing 06/07/2018  . Family history of breast cancer   . Family history of colon cancer   . Family history of pancreatic cancer   . Malignant neoplasm of upper-inner quadrant of right breast in female, estrogen receptor positive ([place]) 04/19/2018  . Wears hearing aid   . Wears glasses   . Vaginal vault prolapse   . Sensation of pressure in bladder area   . PONV (postoperative nausea  and vomiting)   . History of breast cancer   . History of adenomatous polyp of colon   . Hiatal hernia   . BPPV (benign paroxysmal positional vertigo)   . Palpitations 10/31/2016  . Prolapse of female pelvic organs 02/29/2016  . Radicular pain of right lower back 04/06/2015  . Compression fracture of L3 lumbar vertebra 04/06/2015  . Senile osteoporosis 04/06/2015  . DDD (degenerative disc disease), lumbar 04/06/2015  . Facet arthropathy, lumbar 04/06/2015  . Other malaise and fatigue 01/17/2014  . Hx of adenomatous colonic polyps 07/11/2013  . Osteoarthritis of right knee   . Hyperlipidemia LDL goal <100   . GERD (gastroesophageal reflux disease)   . Bladder prolapse, female, acquired   . Allergic sinusitis   . Esophageal reflux 10/12/2011  . Personal history of chemotherapy 08/08/1990   Past Medical History:  Diagnosis  Date  . Asthma   . BPPV (benign paroxysmal positional vertigo)   . Compression fracture of L3 lumbar vertebra 04/06/2015  . Family history of breast cancer   . Family history of colon cancer   . Family history of pancreatic cancer   . GERD (gastroesophageal reflux disease)   . Hiatal hernia    pt states she doesnt have this  . History of adenomatous polyp of colon    tubular adenoma 2005 and  2014 tubular adenoma and  hyperplastic polyp  . History of breast cancer per pt no recurrence   dx 1992 --  s/p  left breast mastectomy and chemotherapy  . Hyperlipidemia LDL goal <100   . Osteoarthritis of right knee   . Personal history of chemotherapy 1992  . PONV (postoperative nausea and vomiting)    and "gets weak and light-leaded"  . Sensation of pressure in bladder area   . Vaginal vault prolapse    anterior  . Wears glasses   . Wears hearing aid    left only    Family History  Problem Relation Age of Onset  . Heart attack Mother   . Colon cancer Mother 23  . Hypertension Mother   . Heart disease Father   . Breast cancer Sister        pat half sister dx in her 25s  . Pancreatic cancer Other 19    Past Surgical History:  Procedure Laterality Date  . BREAST BIOPSY Right 04/19/2018  . BREAST LUMPECTOMY Right 05/17/2018  . BREAST LUMPECTOMY WITH RADIOACTIVE SEED LOCALIZATION Right 05/17/2018   Procedure: RIGHT BREAST LUMPECTOMY WITH RADIOACTIVE SEED LOCALIZATION;  Surgeon: [name], MD;  Location: [place];  Service: General;  Laterality: Right;  . CATARACT EXTRACTION W/ INTRAOCULAR LENS  IMPLANT, BILATERAL  2014  . CHOLECYSTECTOMY  1985  . COLONOSCOPY  last one 06-04-2013  . CYSTOCELE REPAIR N/A 02/29/2016   Procedure: ANTERIOR VAULT PROLAPSE REPAIR COLOPLAST [place] FIXATION AUGMENTED AXIS DERMIS REPAIR  ;  Surgeon: Carolan Clines, MD;  Location: [place];  Service: Urology;  Laterality: N/A;  . ESOPHAGOGASTRODUODENOSCOPY  08-31-2009  . LAPAROSCOPIC  ASSISTED VAGINAL HYSTERECTOMY  01-10-2000   w/ Bilateral Salpingoophorectomy /  Anterior & Posterior repair/  Pubovaginal Sling  . MASTECTOMY Left 1992   Social History   Occupational History  . Occupation: Retired Product manager: RETIRED  Tobacco Use  . Smoking status: Former Smoker    Years: 13.00    Types: Cigarettes    Quit date: 10/12/1983    Years since quitting: 36.6  . Smokeless tobacco: Never Used  Vaping  Use  . Vaping Use: Never used  Substance and Sexual Activity  . Alcohol use: No  . Drug use: No  . Sexual activity: Not on file

## 2020-06-08 ENCOUNTER — Ambulatory Visit
Admission: RE | Admit: 2020-06-08 | Discharge: 2020-06-08 | Disposition: A | Discharge status: Home / Self Care | Payer: Medicare PPO | Source: Ambulatory Visit | Attending: Hematology and Oncology | Admitting: Hematology and Oncology

## 2020-06-08 ENCOUNTER — Other Ambulatory Visit: Payer: Self-pay

## 2020-06-08 DIAGNOSIS — Z780 Asymptomatic menopausal state: Secondary | ICD-10-CM | POA: Diagnosis not present

## 2020-06-08 DIAGNOSIS — M85852 Other specified disorders of bone density and structure, left thigh: Secondary | ICD-10-CM | POA: Diagnosis not present

## 2020-06-08 DIAGNOSIS — M8580 Other specified disorders of bone density and structure, unspecified site: Secondary | ICD-10-CM

## 2020-06-08 NOTE — Progress Notes (Signed)
She continues to have osteopenia.  Thinner than normal bones but not as bad as osteoporosis.  There have been slight improvements since last check so I recommend she continues calcium, vitamin D and prolia.

## 2020-06-08 NOTE — Progress Notes (Signed)
Looks like the note is not done yet.

## 2020-06-09 MED ORDER — METHYLPREDNISOLONE ACETATE 40 MG/ML IJ SUSP
40.0000 mg | INTRAMUSCULAR | Status: AC | PRN
  Administered 2020-06-05: 40 mg via INTRA_ARTICULAR

## 2020-06-09 MED ORDER — LIDOCAINE HCL 1 % IJ SOLN
0.5000 mL | INTRAMUSCULAR | Status: AC | PRN
  Administered 2020-06-05: .5 mL

## 2020-06-09 MED ORDER — BUPIVACAINE HCL 0.25 % IJ SOLN
4.0000 mL | INTRAMUSCULAR | Status: AC | PRN
  Administered 2020-06-05: 4 mL via INTRA_ARTICULAR

## 2020-06-11 ENCOUNTER — Telehealth: Payer: Self-pay

## 2020-06-11 NOTE — Telephone Encounter (Addendum)
The patient is scheduled for a Prolia injection on 06/29/20. She stated her orthopedist recommended for her to not get the injection. She stated she had discuss this with you in the past. She would like to discuss this with you again before she cancels her appointment.  I have attached a portion of the last orthopedic note from 10//29/21 with his explanation.    "Plan: Patient has known osteoporosis.  She has been on biphosphonate treatment for many years.  Currently she is on Prolia.  I discussed with the recommendations for cycling biphosphonate's taking in 18 months and skipping 6 months to allow time for bone remodeling to improve bone architecture.  Currently she is on tamoxifen and will we again discussed problems with that treatment with bone loss problems etc.  I discussed that there improved bone architecture if she will skip 1 cycle of biphosphonate treatment for 6 months and then repeat her bone density test.  Intra-articular injection performed in her knee today with improvement in her pain in her knee.  She can follow-up with me she has persistent problems.  We discussed the arthritis in her knee severe enough that knee arthroplasty would be the surgical treatment but hopefully should get some relief with the injection."

## 2020-06-11 NOTE — Telephone Encounter (Signed)
I'm not aware of any recommendations about holding prolia due to increasing bone accumulation and typically bone density declines when it is held.  Bisphosphonates (a different class of medications than prolia) are usually held for a year after 5 yrs of therapy to prevent femur fractures when bone becomes too dense.

## 2020-06-12 NOTE — Telephone Encounter (Signed)
Called patient with the information provided by Dr. Mariea Clonts. Patient stated she knew that was what she would say. She is not 100% comfortable taking the injection, but also has read that you should not stop the injection "cold [place]."  I offered to make her an appointment to discuss this in detail again so that at least she might feel better about her decision and she stated she "didn't feel like it would change her (Dr. Cyndi Lennert) mind." She stated that she would keep her appointment for 11/22. She stated she has an appointment with Dr. Mariea Clonts in March and she would discuss it with her at that time.

## 2020-06-22 NOTE — Telephone Encounter (Signed)
Patient called and rescheduled her Prolia for 6 months out, skipping one cycle. She stated she had talked with her daughter and that it was hard to know what to do with different doctors providing different information, but that they decided to skip the cycle due for November.

## 2020-06-29 ENCOUNTER — Ambulatory Visit: Payer: Medicare PPO

## 2020-07-20 DIAGNOSIS — N8110 Cystocele, unspecified: Secondary | ICD-10-CM | POA: Diagnosis not present

## 2020-07-28 ENCOUNTER — Telehealth: Payer: Self-pay | Admitting: Hematology and Oncology

## 2020-07-28 NOTE — Telephone Encounter (Signed)
Rescheduled appt due to provider PAL. Patient is aware of changes. 

## 2020-07-29 ENCOUNTER — Other Ambulatory Visit: Payer: Self-pay | Admitting: Cardiology

## 2020-07-29 DIAGNOSIS — E782 Mixed hyperlipidemia: Secondary | ICD-10-CM

## 2020-07-29 DIAGNOSIS — I493 Ventricular premature depolarization: Secondary | ICD-10-CM

## 2020-07-29 DIAGNOSIS — E785 Hyperlipidemia, unspecified: Secondary | ICD-10-CM

## 2020-07-29 DIAGNOSIS — R002 Palpitations: Secondary | ICD-10-CM

## 2020-09-04 ENCOUNTER — Ambulatory Visit: Payer: Medicare PPO | Admitting: Hematology and Oncology

## 2020-09-14 NOTE — Progress Notes (Signed)
Patient Care Team: [name], DO as PCP - General (Geriatric Medicine) [name], MD as PCP - Cardiology (Cardiology) [name], MD as Consulting Physician (General Surgery) [name], MD as Consulting Physician (Hematology and Oncology) [name], MD as Consulting Physician (Radiation Oncology) Delice Bison Charlestine Massed, NP as Nurse Practitioner (Hematology and Oncology)  DIAGNOSIS:    ICD-10-CM   1. Malignant neoplasm of upper-inner quadrant of right breast in female, estrogen receptor positive ([place])  C50.211    Z17.0     SUMMARY OF ONCOLOGIC HISTORY: Oncology History Overview Note  PMS2 VUS on Multi-cancer panel.   Malignant neoplasm of upper-inner quadrant of right breast in female, estrogen receptor positive ([place])  1992 Miscellaneous   Left breast cancer treated with mastectomy followed by adjuvant chemotherapy and 5 years of tamoxifen   04/17/2018 Initial Diagnosis   Screening detected right breast mass, by ultrasound measured 5 mm UIQ middle depth, biopsy revealed grade 1 IDC with DCIS, ER 100%, PR 100%, HER-2 -1+ by IHC, T1 a N0 stage I a clinical stage   05/17/2018 Surgery   Right lumpectomy: IDC, 5.5 mm, grade 1, margins negative, ER 100%, PR 100%, HER-2 1+ by IHC, Ki-67 less than 1%, T1BNX stage Ia   05/28/2018 -  Anti-estrogen oral therapy   Letrozole daily discontinued 03/04/19, switched to anastrozole 03/23/2019, switched to tamoxifen 06/04/19   05/30/2018 Cancer Staging   Staging form: Breast, AJCC 8th Edition - Pathologic: Stage IA (pT1b, pN0, cM0, G1, ER+, PR+, HER2-) - Signed by Gardenia Phlegm, NP on 05/30/2018   06/05/2018 Genetic Testing   PMS2 c.2559C>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  The Multi-Gene Panel offered by [facility] includes sequencing and/or deletion duplication testing of the following 84 genes: AIP, ALK, APC, ATM, AXIN2,BAP1,  BARD1, BLM, BMPR1A, BRCA1, BRCA2, BRIP1, CASR, CDC73, CDH1,  CDK4, CDKN1B, CDKN1C, CDKN2A (p14ARF), CDKN2A (p16INK4a), CEBPA, CHEK2, CTNNA1, DICER1, DIS3L2, EGFR (c.2369C>T, p.Thr790Met variant only), EPCAM (Deletion/duplication testing only), FH, FLCN, GATA2, GPC3, GREM1 (Promoter region deletion/duplication testing only), HOXB13 (c.251G>A, p.Gly84Glu), HRAS, KIT, MAX, MEN1, MET, MITF (c.952G>A, p.Glu318Lys variant only), MLH1, MSH2, MSH3, MSH6, MUTYH, NBN, NF1, NF2, NTHL1, PALB2, PDGFRA, PHOX2B, PMS2, POLD1, POLE, POT1, PRKAR1A, PTCH1, PTEN, RAD50, RAD51C, RAD51D, RB1, RECQL4, RET, RUNX1, SDHAF2, SDHA (sequence changes only), SDHB, SDHC, SDHD, SMAD4, SMARCA4, SMARCB1, SMARCE1, STK11, SUFU, TERC, TERT, TMEM127, TP53, TSC1, TSC2, VHL, WRN and WT1.  The report date is 06/05/2018.     CHIEF COMPLIANT: Follow-up of right breast cancer on tamoxifen  INTERVAL HISTORY: Rebecca Guzman is a 87 y.o. with above-mentioned history of right breast cancer who underwent a lumpectomy and is currently on anti-estrogen therapy withtamoxifenafter she could not tolerate letrozole or anastrozole.Mammogram on 04/09/20 showed no evidence of malignancy bilaterally. She presentsto the clinictodayfor follow-up   ALLERGIES:  is allergic to codeine and morphine and related.  MEDICATIONS:  Current Outpatient Medications  Medication Sig Dispense Refill   acetaminophen (TYLENOL) 500 MG tablet Take 1,000 mg by mouth every morning.      Calcium Carbonate-Vit D-Min (CALCIUM 600+D3 PLUS MINERALS) 600-800 MG-UNIT TABS Take 2 tablets by mouth daily. 180 tablet 3   fexofenadine (ALLEGRA) 180 MG tablet Take 180 mg by mouth daily.     metoprolol tartrate (LOPRESSOR) 25 MG tablet TAKE 1 TABLET(25 MG) BY MOUTH TWICE DAILY 180 tablet 3   Multiple Vitamin (MULTIVITAMIN) tablet Take 1 tablet by mouth daily.     Multiple Vitamins-Minerals (PRESERVISION AREDS 2 PO) Take by mouth.  Omega-3 Fatty Acids (FISH OIL) 1000 MG CAPS Take 1 capsule (1,000 mg total) by mouth daily.  0   omeprazole  (PRILOSEC) 40 MG capsule TAKE 1 CAPSULE(40 MG) BY MOUTH TWICE DAILY 180 capsule 1   Probiotic Product (PROBIOTIC FORMULA PO) Take 1 tablet by mouth every evening.      rosuvastatin (CRESTOR) 5 MG tablet Take 1 tablet (5 mg total) by mouth 4 (four) times a week. 48 tablet 3   No current facility-administered medications for this visit.    PHYSICAL EXAMINATION: ECOG PERFORMANCE STATUS: 1 - Symptomatic but completely ambulatory  Vitals:   09/15/20 0929  BP: 121/73  Pulse: 74  Resp: 18  Temp: (!) 97.5 F (36.4 C)  SpO2: 98%   Filed Weights   09/15/20 0929  Weight: 173 lb 1.6 oz (78.5 kg)    LABORATORY DATA:  I have reviewed the data as listed CMP Latest Ref Rng & Units 05/08/2020 04/22/2019 05/10/2018  Glucose 65 - 99 mg/dL 94 107(H) 105(H)  BUN 7 - 25 mg/dL '17 16 15  ' Creatinine 0.60 - 0.88 mg/dL 0.77 0.78 0.90  Sodium 135 - 146 mmol/L 140 142 139  Potassium 3.5 - 5.3 mmol/L 4.2 4.3 4.2  Chloride 98 - 110 mmol/L 105 108 102  CO2 20 - 32 mmol/L '27 27 27  ' Calcium 8.6 - 10.4 mg/dL 9.4 9.5 9.7  Total Protein 6.1 - 8.1 g/dL 6.5 6.4 -  Total Bilirubin 0.2 - 1.2 mg/dL 0.4 0.5 -  Alkaline Phos 38 - 126 U/L - - -  AST 10 - 35 U/L 15 17 -  ALT 6 - 29 U/L 12 14 -    Lab Results  Component Value Date   WBC 6.7 05/08/2020   HGB 13.0 05/08/2020   HCT 39.1 05/08/2020   MCV 94.2 05/08/2020   PLT 195 05/08/2020   NEUTROABS 4,449 05/08/2020    ASSESSMENT & PLAN:  Malignant neoplasm of upper-inner quadrant of right breast in female, estrogen receptor positive ([place]) 05/17/2018:Right lumpectomy: IDC, 5.5 mm, grade 1, margins negative, ER 100%, PR 100%, HER-2 1+ by IHC, Ki-67 less than 1%, T1BNX stage Ia We did not recommend adjuvant radiation therapy because of her favorable profile and her age.  Current treatment: Letrozole 2.5 mg daily started 05/28/2018, switched to anastrozole 03/23/2019(due to arthralgias and fatigue), switched to tamoxifen 06/04/2019  Tamoxifen  toxicities: Profound fatigue which got better when she stopped Tam We decided to discontinue Tamoxifen for QOL concerns  Breast cancer surveillance:  Mammogram 04/09/2020: No evidence of malignancy breast density category B   Bone density in Nov 2021: T score -1.9 Osteopenia: rec Cal + Vit D and walking for exercises Return to clinic in 1 year for follow-up.     No orders of the defined types were placed in this encounter.  The patient has a good understanding of the overall plan. she agrees with it. she will call with any problems that may develop before the next visit here.  Total time spent: 20 mins including face to face time and time spent for planning, charting and coordination of care  Rulon Eisenmenger, MD, MPH 09/15/2020  I, Cloyde Reams Dorshimer, am acting as scribe for Dr. [name].  I have reviewed the above documentation for accuracy and completeness, and I agree with the above.

## 2020-09-15 ENCOUNTER — Other Ambulatory Visit: Payer: Self-pay

## 2020-09-15 ENCOUNTER — Inpatient Hospital Stay: Payer: Medicare PPO | Attending: Hematology and Oncology | Admitting: Hematology and Oncology

## 2020-09-15 DIAGNOSIS — C50211 Malignant neoplasm of upper-inner quadrant of right female breast: Secondary | ICD-10-CM | POA: Diagnosis not present

## 2020-09-15 DIAGNOSIS — Z9221 Personal history of antineoplastic chemotherapy: Secondary | ICD-10-CM | POA: Diagnosis not present

## 2020-09-15 DIAGNOSIS — M8580 Other specified disorders of bone density and structure, unspecified site: Secondary | ICD-10-CM | POA: Insufficient documentation

## 2020-09-15 DIAGNOSIS — Z853 Personal history of malignant neoplasm of breast: Secondary | ICD-10-CM | POA: Insufficient documentation

## 2020-09-15 DIAGNOSIS — Z9011 Acquired absence of right breast and nipple: Secondary | ICD-10-CM | POA: Diagnosis not present

## 2020-09-15 DIAGNOSIS — Z170 Estrogen receptor positive status [ER+]: Secondary | ICD-10-CM

## 2020-09-15 DIAGNOSIS — Z79810 Long term (current) use of selective estrogen receptor modulators (SERMs): Secondary | ICD-10-CM | POA: Insufficient documentation

## 2020-09-15 DIAGNOSIS — Z79899 Other long term (current) drug therapy: Secondary | ICD-10-CM | POA: Diagnosis not present

## 2020-09-15 NOTE — Assessment & Plan Note (Addendum)
05/17/2018:Right lumpectomy: IDC, 5.5 mm, grade 1, margins negative, ER 100%, PR 100%, HER-2 1+ by IHC, Ki-67 less than 1%, T1BNX stage Ia We did not recommend adjuvant radiation therapy because of her favorable profile and her age.  Current treatment: Letrozole 2.5 mg daily started 05/28/2018, switched to anastrozole 03/23/2019(due to arthralgias and fatigue), switched to tamoxifen 06/04/2019  Tamoxifen toxicities: Profound fatigue which got better when she stopped Tam We decided to discontinue Tamoxifen for QOL concerns  Breast cancer surveillance:  Mammogram 04/09/2020: No evidence of malignancy breast density category B   Bone density in Nov 2021: T score -1.9 Osteopenia: rec Cal + Vit D and walking for exercises Return to clinic in 1 year for follow-up.

## 2020-09-28 ENCOUNTER — Encounter: Payer: Self-pay | Admitting: Internal Medicine

## 2020-10-16 ENCOUNTER — Other Ambulatory Visit: Payer: Self-pay

## 2020-10-16 ENCOUNTER — Ambulatory Visit: Payer: Medicare PPO | Admitting: *Deleted

## 2020-10-16 DIAGNOSIS — M810 Age-related osteoporosis without current pathological fracture: Secondary | ICD-10-CM

## 2020-10-16 MED ORDER — DENOSUMAB 60 MG/ML ~~LOC~~ SOSY
60.0000 mg | PREFILLED_SYRINGE | Freq: Once | SUBCUTANEOUS | Status: AC
  Administered 2020-10-16: 60 mg via SUBCUTANEOUS

## 2020-10-20 DIAGNOSIS — N8110 Cystocele, unspecified: Secondary | ICD-10-CM | POA: Diagnosis not present

## 2020-10-28 ENCOUNTER — Other Ambulatory Visit: Payer: Self-pay | Admitting: *Deleted

## 2020-10-28 DIAGNOSIS — C50211 Malignant neoplasm of upper-inner quadrant of right female breast: Secondary | ICD-10-CM

## 2020-11-04 ENCOUNTER — Ambulatory Visit: Payer: Medicare PPO | Admitting: Orthopaedic Surgery

## 2020-11-04 ENCOUNTER — Other Ambulatory Visit: Payer: Self-pay

## 2020-11-04 ENCOUNTER — Encounter: Payer: Self-pay | Admitting: Orthopaedic Surgery

## 2020-11-04 VITALS — BP 150/77 | HR 87 | Ht 65.0 in | Wt 165.0 lb

## 2020-11-04 DIAGNOSIS — M1711 Unilateral primary osteoarthritis, right knee: Secondary | ICD-10-CM | POA: Diagnosis not present

## 2020-11-04 NOTE — Progress Notes (Signed)
Office Visit Note   Patient: Rebecca Guzman           Date of Birth: 1934/02/08           MRN: [id number] Visit Date: 11/04/2020              Requested by: [name], DO [place],  [place] [address] PCP: No primary care provider on file.   Assessment & Plan: Visit Diagnoses:  1. Unilateral primary osteoarthritis, right knee   2. Primary osteoarthritis of right knee     Plan: Intra-articular injection performed right knee which she tolerated well.  She can return in 2 months and wants to discuss scheduling total knee arthroplasty at that time.  She can continue the [place] that she is taking use intermittent ice.  Follow-Up Instructions: Return in about 2 months (around 01/04/2021).   Orders:  Orders Placed This Encounter  Procedures  . Large Joint Inj: R knee   No orders of the defined types were placed in this encounter.     Procedures: Large Joint Inj: R knee on 11/04/2020 8:42 AM Indications: pain and joint swelling Details: 22 G 1.5 in needle, anterolateral approach  Arthrogram: No  Medications: 40 mg methylPREDNISolone acetate 40 MG/ML; 0.5 mL lidocaine 1 %; 4 mL bupivacaine 0.25 % Outcome: tolerated well, no immediate complications Procedure, treatment alternatives, risks and benefits explained, specific risks discussed. Consent was given by the patient. Immediately prior to procedure a time out was called to verify the correct patient, procedure, equipment, support staff and site/side marked as required. Patient was prepped and draped in the usual sterile fashion.       Clinical Data: No additional findings.   Subjective: Chief Complaint  Patient presents with  . Right Knee - Pain    HPI 87 year old female returns with persistent problems with her right knee.  She got an injection in October did well for many months and states she like a repeat injection wants to discuss total knee arthroplasty.  She is thinking about setting this up in a  few months but feels like 1 more injection will give her some time to make arrangements for postop care.  Today's visit we discussed total knee arthroplasty with postop therapy, operative technique, home health, outpatient therapy, spinal anesthesia etc.  Review of Systems Updated unchanged from last office visit.  Objective: Vital Signs: BP (!) 150/77   Pulse 87   Ht 5\' 5"  (1.651 m)   Wt 165 lb (74.8 kg)   LMP 11/21/1985   BMI 27.46 kg/m   Physical Exam Constitutional:      Appearance: She is well-developed.  HENT:     Head: Normocephalic.     Right Ear: External ear normal.     Left Ear: External ear normal.  Eyes:     Pupils: Pupils are equal, round, and reactive to light.  Neck:     Thyroid: No thyromegaly.     Trachea: No tracheal deviation.  Cardiovascular:     Rate and Rhythm: Normal rate.  Pulmonary:     Effort: Pulmonary effort is normal.  Abdominal:     Palpations: Abdomen is soft.  Skin:    General: Skin is warm and dry.  Neurological:     Mental Status: She is alert and oriented to person, place, and time.  Psychiatric:        Behavior: Behavior normal.     Ortho Exam  Specialty Comments:  No specialty comments  available.  Imaging: XR Knee 1-2 Views Right (Accession [id number]) (Order [id number]) Imaging Date: 06/05/2020 Department: [facility] Ordering/Authorizing: Marybelle Killings, MD    Exam Status  Status  Final [99]   PACS Intelerad Image Link  Show images for XR Knee 1-2 Views Right  Result Narrative  Standing AP both knees and lateral right knee obtained and reviewed. This  shows bilateral knee osteoarthritis with worse changes on the right than  left knee. There is no right knee medial joint space bone-on-bone changes  marginal osteophyte subchondral sclerosis.   Impression: Bilateral knee osteoarthritis worse on the right than left  knee, medial compartment bone-on-bone.     PMFS History: Patient Active  Problem List   Diagnosis Date Noted  . Pure hypercholesterolemia 05/02/2019  . Genetic testing 06/07/2018  . Family history of breast cancer   . Family history of colon cancer   . Family history of pancreatic cancer   . Malignant neoplasm of upper-inner quadrant of right breast in female, estrogen receptor positive ([place]) 04/19/2018  . Wears hearing aid   . Wears glasses   . Vaginal vault prolapse   . Sensation of pressure in bladder area   . PONV (postoperative nausea and vomiting)   . History of breast cancer   . History of adenomatous polyp of colon   . Hiatal hernia   . BPPV (benign paroxysmal positional vertigo)   . Palpitations 10/31/2016  . Prolapse of female pelvic organs 02/29/2016  . Radicular pain of right lower back 04/06/2015  . Compression fracture of L3 lumbar vertebra 04/06/2015  . Senile osteoporosis 04/06/2015  . DDD (degenerative disc disease), lumbar 04/06/2015  . Facet arthropathy, lumbar 04/06/2015  . Other malaise and fatigue 01/17/2014  . Hx of adenomatous colonic polyps 07/11/2013  . Osteoarthritis of right knee   . Hyperlipidemia LDL goal <100   . GERD (gastroesophageal reflux disease)   . Bladder prolapse, female, acquired   . Allergic sinusitis   . Esophageal reflux 10/12/2011  . Personal history of chemotherapy 08/08/1990   Past Medical History:  Diagnosis Date  . Asthma   . BPPV (benign paroxysmal positional vertigo)   . Compression fracture of L3 lumbar vertebra 04/06/2015  . Family history of breast cancer   . Family history of colon cancer   . Family history of pancreatic cancer   . GERD (gastroesophageal reflux disease)   . Hiatal hernia    pt states she doesnt have this  . History of adenomatous polyp of colon    tubular adenoma 2005 and  2014 tubular adenoma and  hyperplastic polyp  . History of breast cancer per pt no recurrence   dx 1992 --  s/p  left breast mastectomy and chemotherapy  . Hyperlipidemia LDL goal <100   .  Osteoarthritis of right knee   . Personal history of chemotherapy 1992  . PONV (postoperative nausea and vomiting)    and "gets weak and light-leaded"  . Sensation of pressure in bladder area   . Vaginal vault prolapse    anterior  . Wears glasses   . Wears hearing aid    left only    Family History  Problem Relation Age of Onset  . Heart attack Mother   . Colon cancer Mother 55  . Hypertension Mother   . Heart disease Father   . Breast cancer Sister        pat half sister dx in her 28s  . Pancreatic cancer Other 50  Past Surgical History:  Procedure Laterality Date  . BREAST BIOPSY Right 04/19/2018  . BREAST LUMPECTOMY Right 05/17/2018  . BREAST LUMPECTOMY WITH RADIOACTIVE SEED LOCALIZATION Right 05/17/2018   Procedure: RIGHT BREAST LUMPECTOMY WITH RADIOACTIVE SEED LOCALIZATION;  Surgeon: [name], MD;  Location: [facility];  Service: General;  Laterality: Right;  . CATARACT EXTRACTION W/ INTRAOCULAR LENS  IMPLANT, BILATERAL  2014  . CHOLECYSTECTOMY  1985  . COLONOSCOPY  last one 06-04-2013  . CYSTOCELE REPAIR N/A 02/29/2016   Procedure: ANTERIOR VAULT PROLAPSE REPAIR COLOPLAST [place] FIXATION AUGMENTED AXIS DERMIS REPAIR  ;  Surgeon: Carolan Clines, MD;  Location: [facility];  Service: Urology;  Laterality: N/A;  . ESOPHAGOGASTRODUODENOSCOPY  08-31-2009  . LAPAROSCOPIC ASSISTED VAGINAL HYSTERECTOMY  01-10-2000   w/ Bilateral Salpingoophorectomy /  Anterior & Posterior repair/  Pubovaginal Sling  . MASTECTOMY Left 1992   Social History   Occupational History  . Occupation: Retired Product manager: RETIRED  Tobacco Use  . Smoking status: Former Smoker    Years: 13.00    Types: Cigarettes    Quit date: 10/12/1983    Years since quitting: 37.1  . Smokeless tobacco: Never Used  Vaping Use  . Vaping Use: Never used  Substance and Sexual Activity  . Alcohol use: No  . Drug use: No  . Sexual activity: Not on file

## 2020-11-05 ENCOUNTER — Ambulatory Visit: Payer: Medicare PPO | Admitting: Internal Medicine

## 2020-11-09 MED ORDER — BUPIVACAINE HCL 0.25 % IJ SOLN
4.0000 mL | INTRAMUSCULAR | Status: AC | PRN
  Administered 2020-11-04: 4 mL via INTRA_ARTICULAR

## 2020-11-09 MED ORDER — LIDOCAINE HCL 1 % IJ SOLN
0.5000 mL | INTRAMUSCULAR | Status: AC | PRN
  Administered 2020-11-04: .5 mL

## 2020-11-09 MED ORDER — METHYLPREDNISOLONE ACETATE 40 MG/ML IJ SUSP
40.0000 mg | INTRAMUSCULAR | Status: AC | PRN
  Administered 2020-11-04: 40 mg via INTRA_ARTICULAR

## 2020-12-21 ENCOUNTER — Ambulatory Visit: Payer: Medicare PPO

## 2020-12-22 NOTE — Progress Notes (Signed)
Cardiology Office Note:    Date:  12/25/2020   ID:  Rebecca Guzman, DOB 1934/07/17, MRN [id number]  PCP:  No primary care provider on file.   [facility] Providers Cardiologist:  Ena Dawley, MD (Inactive) {   Referring MD: [name], DO     History of Present Illness:    Rebecca Guzman is a 87 y.o. female with a hx of asthma, GERD, HLD and palpitations who was previously followed by Dr. Meda Coffee who now presents to clinic for follow-up of palpitations.  Per review of the record, the patient has been followed by Dr. Meda Coffee for palpitations. In 2018 she underwent Holter monitoring that showed frequent PACs (700 in 34 hrs) and few very short runs of atrial tachycardia with the longest lasting 4 beats. She was started on metoprolol 12.5 mg by mouth twice a day with significant improvement of symptoms. She also had a TTE that showed LVEF 60-65% and grade 1 diastolic dysfunction, trivial MR, normal left atrial size and mild TR. her metoprolol was later increased to 25 mg p.o. twice daily with improvement of her symptoms.  The patient also had breast cancer 1992 s/p chemo and mastectomy but no radiation at that time. She was diagnosed with recurrent breast cancer 10/2017 which was very localized; she is in remission and is off tamoxifen.  No chemo or radiation at that time.  Last saw Dr. Meda Coffee on 12/19/19 where she was doing well from a CV standpoint.  Today, the patient states that she overall feels well. Continues to have intermittent palpitations but that she notices mainly at night. Not overly bothersome and do not sustained >56mn. Has also been suffering from seasonal allergies with some congestion in the AM. No chest pain, shortness of breath, lightheadedness, dizziness or syncope. Has right knee arthritis which is limiting her mobility. Currently walking 10-140mutes daily. She is contemplating right TKA.  Past Medical History:  Diagnosis Date  . Asthma   . BPPV (benign  paroxysmal positional vertigo)   . Compression fracture of L3 lumbar vertebra 04/06/2015  . Family history of breast cancer   . Family history of colon cancer   . Family history of pancreatic cancer   . GERD (gastroesophageal reflux disease)   . Hiatal hernia    pt states she doesnt have this  . History of adenomatous polyp of colon    tubular adenoma 2005 and  2014 tubular adenoma and  hyperplastic polyp  . History of breast cancer per pt no recurrence   dx 1992 --  s/p  left breast mastectomy and chemotherapy  . Hyperlipidemia LDL goal <100   . Osteoarthritis of right knee   . Personal history of chemotherapy 1992  . PONV (postoperative nausea and vomiting)    and "gets weak and light-leaded"  . Sensation of pressure in bladder area   . Vaginal vault prolapse    anterior  . Wears glasses   . Wears hearing aid    left only    Past Surgical History:  Procedure Laterality Date  . BREAST BIOPSY Right 04/19/2018  . BREAST LUMPECTOMY Right 05/17/2018  . BREAST LUMPECTOMY WITH RADIOACTIVE SEED LOCALIZATION Right 05/17/2018   Procedure: RIGHT BREAST LUMPECTOMY WITH RADIOACTIVE SEED LOCALIZATION;  Surgeon: ByStark KleinMD;  [facility]: [facility];  Laterality: Right;  . CATARACT EXTRACTION W/ INTRAOCULAR LENS  IMPLANT, BILATERAL  2014  . CHOLECYSTECTOMY  1985  . COLONOSCOPY  last one 06-04-2013  . CYSTOCELE REPAIR N/A  02/29/2016   Procedure: ANTERIOR VAULT PROLAPSE REPAIR COLOPLAST [place] FIXATION AUGMENTED AXIS DERMIS REPAIR  ;  Surgeon: Carolan Clines, MD;  Location: [facility];  Service: Urology;  Laterality: N/A;  . ESOPHAGOGASTRODUODENOSCOPY  08-31-2009  . LAPAROSCOPIC ASSISTED VAGINAL HYSTERECTOMY  01-10-2000   w/ Bilateral Salpingoophorectomy /  Anterior & Posterior repair/  Pubovaginal Sling  . MASTECTOMY Left 1992    Current Medications: Current Meds  Medication Sig  . acetaminophen (TYLENOL) 500 MG tablet Take 1,000 mg by mouth  every morning.   . Calcium Carbonate-Vit D-Min (CALCIUM 600+D3 PLUS MINERALS) 600-800 MG-UNIT TABS Take 2 tablets by mouth daily.  . fexofenadine (ALLEGRA) 180 MG tablet Take 180 mg by mouth daily.  . metoprolol tartrate (LOPRESSOR) 25 MG tablet TAKE 1 TABLET(25 MG) BY MOUTH TWICE DAILY  . Multiple Vitamin (MULTIVITAMIN) tablet Take 1 tablet by mouth daily.  . Multiple Vitamins-Minerals (PRESERVISION AREDS 2 PO) Take by mouth.  . Omega-3 Fatty Acids (FISH OIL) 1000 MG CAPS Take 1 capsule (1,000 mg total) by mouth daily.  Marland Kitchen omeprazole (PRILOSEC) 40 MG capsule TAKE 1 CAPSULE(40 MG) BY MOUTH TWICE DAILY  . Probiotic Product (PROBIOTIC FORMULA PO) Take 1 tablet by mouth every evening.   . [DISCONTINUED] rosuvastatin (CRESTOR) 5 MG tablet Take 1 tablet (5 mg total) by mouth 4 (four) times a week.     Allergies:   Codeine and Morphine and related   Social History   Socioeconomic History  . Marital status: Widowed    Spouse name: Not on file  . Number of children: 4  . Years of education: Not on file  . Highest education level: Not on file  Occupational History  . Occupation: Retired Product manager: RETIRED  Tobacco Use  . Smoking status: Former Smoker    Years: 13.00    Types: Cigarettes    Quit date: 10/12/1983    Years since quitting: 37.2  . Smokeless tobacco: Never Used  Vaping Use  . Vaping Use: Never used  Substance and Sexual Activity  . Alcohol use: No  . Drug use: No  . Sexual activity: Not on file  Other Topics Concern  . Not on file  Social History Narrative  . Not on file   Social Determinants of Health   Financial Resource Strain: Not on file  Food Insecurity: Not on file  Transportation Needs: Not on file  Physical Activity: Not on file  Stress: Not on file  Social Connections: Not on file     Family History: The patient's family history includes Breast cancer in her sister; Colon cancer (age of onset: 38) in her mother; Heart attack in her mother;  Heart disease in her father; Hypertension in her mother; Pancreatic cancer (age of onset: 59) in an other family member.  ROS:   Please see the history of present illness.    Review of Systems  Constitutional: Negative for chills and fever.  HENT: Positive for congestion.   Eyes: Negative for blurred vision.  Respiratory: Negative for cough.   Cardiovascular: Positive for palpitations. Negative for chest pain, orthopnea, claudication, leg swelling and PND.  Gastrointestinal: Negative for nausea and vomiting.  Genitourinary: Negative for dysuria.  Musculoskeletal: Positive for joint pain. Negative for falls.  Neurological: Negative for dizziness and loss of consciousness.  Endo/Heme/Allergies: Positive for environmental allergies.  Psychiatric/Behavioral: Negative for substance abuse.    EKGs/Labs/Other Studies Reviewed:    The following studies were reviewed today: TTE 2016-09-27: Study Conclusions  -  Left ventricle: The cavity size was normal. Wall thickness was  increased in a pattern of mild LVH. Systolic function was normal.  The estimated ejection fraction was in the range of 60% to 65%.  Wall motion was normal; there were no regional wall motion  abnormalities. Doppler parameters are consistent with abnormal  left ventricular relaxation (grade 1 diastolic dysfunction). The  E/e&' ratio is >15, suggesting elevated LV filling pressure.  - Mitral valve: Mildly thickened and sclerotic anterior leaflet.  There was trivial regurgitation.  - Left atrium: The atrium was normal in size.  - Tricuspid valve: There was mild regurgitation.  - Pulmonary arteries: PA peak pressure: 37 mm Hg (S).  - Inferior vena cava: The vessel was normal in size. The  respirophasic diameter changes were in the normal range (>= 50%),  consistent with normal central venous pressure.   Impressions:   - LVEF 60-65%, mild LVH, normal wall motion, grade 1 DD with  elevated LV filling  pressure, trivial MR, normal LA size, mild  TR, RVSP 37 mmHg, normal IVC.   EKG:  EKG is  ordered today.  The ekg ordered today demonstrates NSR with HR 60  Recent Labs: 05/08/2020: ALT 12; BUN 17; Creat 0.77; Hemoglobin 13.0; Platelets 195; Potassium 4.2; Sodium 140  Recent Lipid Panel    Component Value Date/Time   CHOL 162 05/08/2020 0830   CHOL 187 01/26/2016 0802   TRIG 145 05/08/2020 0830   HDL 63 05/08/2020 0830   HDL 59 01/26/2016 0802   CHOLHDL 2.6 05/08/2020 0830   VLDL 33 (H) 02/13/2017 0832   LDLCALC 76 05/08/2020 0830     Physical Exam:    VS:  BP 126/76   Pulse 80   Ht _0  (1.651 m)   Wt 171 lb 12.8 oz (77.9 kg)   LMP 11/21/1985   SpO2 98%   BMI 28.59 kg/m     Wt Readings from Last 3 Encounters:  12/25/20 171 lb 12.8 oz (77.9 kg)  11/04/20 165 lb (74.8 kg)  09/15/20 173 lb 1.6 oz (78.5 kg)     GEN:  Well nourished, well developed in no acute distress HEENT: Normal NECK: No JVD; No carotid bruits CARDIAC: RRR, 1/6 systolic murmur. No rubs, gallops RESPIRATORY:  Clear to auscultation without rales, wheezing or rhonchi  ABDOMEN: Soft, non-tender, non-distended MUSCULOSKELETAL:  No edema; No deformity  SKIN: Warm and dry NEUROLOGIC:  Alert and oriented x 3 PSYCHIATRIC:  Normal affect   ASSESSMENT:    1. Palpitations   2. Mixed hyperlipidemia   3. Essential hypertension   4. PAC (premature atrial contraction)   5. History of chemotherapy   6. Personal history of chemotherapy   7. Malignant neoplasm of upper-inner quadrant of right breast in female, estrogen receptor positive ([place])    PLAN:    In order of problems listed above:  #Palpitations: #PACs: Holter monitoring that showed frequent PACs (700 in 34 hrs) and few very short runs of atrial tachycardia with the longest lasting 4 beats. TTE with LVEF 16-94%, grade 1 diastolic dysfunction, trivial MR, normal left atrial size and mild TR. Improved on metop. -Continue metop 11m  BID  #HTN: Well controlled.  -Continue metop 233mBID -Goal <120s/80s  #HLD: LDL 76, HDL 63, TG 145, TC 162 (05/2020). -Tolerating crestor 50m54mx/week; renew as needed  #Recurrent breast cancer: Had initial breast cancer in 1990s s/p mastectomy and chemo. No XRT. Had recurrent breast cancer in 2019. No s/p Right lumpectomy: IDC,  5.5 mm, grade 1, margins negative, ER 100%, PR 100%, HER-2 1+ by IHC, Ki-67 less than 1%, T1BNX stage Ia. Previously on tamoxifen, now off and in remission. -Management per Oncology -Will check TTE with strain given history of chemo       Medication Adjustments/Labs and Tests Ordered: Current medicines are reviewed at length with the patient today.  Concerns regarding medicines are outlined above.  Orders Placed This Encounter  Procedures  . EKG 12-Lead  . ECHOCARDIOGRAM COMPLETE   Meds ordered this encounter  Medications  . rosuvastatin (CRESTOR) 5 MG tablet    Sig: Take 1 tablet (5 mg total) by mouth 4 (four) times a week.    Dispense:  48 tablet    Refill:  3    Patient Instructions  Medication Instructions:   Your physician recommends that you continue on your current medications as directed. Please refer to the Current Medication list given to you today.  *If you need a refill on your cardiac medications before your next appointment, please call your pharmacy*  Testing/Procedures:  Your physician has requested that you have an echocardiogram. Echocardiography is a painless test that uses sound waves to create images of your heart. It provides your doctor with information about the size and shape of your heart and how well your heart's chambers and valves are working. This procedure takes approximately one hour. There are no restrictions for this procedure.  DO ECHO WITH STRAIN PER DR. Johney Frame   Follow-Up: At [facility], you and your health needs are our priority.  As part of our continuing mission to provide you with exceptional heart  care, we have created designated Provider Care Teams.  These Care Teams include your primary Cardiologist (physician) and Advanced Practice Providers (APPs -  Physician Assistants and Nurse Practitioners) who all work together to provide you with the care you need, when you need it.  We recommend signing up for the patient portal called "MyChart".  Sign up information is provided on this After Visit Summary.  MyChart is used to connect with patients for Virtual Visits (Telemedicine).  Patients are able to view lab/test results, encounter notes, upcoming appointments, etc.  Non-urgent messages can be sent to your provider as well.   To learn more about what you can do with MyChart, go to [email or web address].    Your next appointment:   1 year(s)  The format for your next appointment:   In Person  Provider:   Gwyndolyn Kaufman, MD       Signed, Freada Bergeron, MD  12/25/2020 10:32 AM    [place]

## 2020-12-25 ENCOUNTER — Other Ambulatory Visit: Payer: Self-pay

## 2020-12-25 ENCOUNTER — Ambulatory Visit: Payer: Medicare PPO | Admitting: Cardiology

## 2020-12-25 ENCOUNTER — Encounter: Payer: Self-pay | Admitting: Cardiology

## 2020-12-25 VITALS — BP 126/76 | HR 80 | Ht 65.0 in | Wt 171.8 lb

## 2020-12-25 DIAGNOSIS — Z9221 Personal history of antineoplastic chemotherapy: Secondary | ICD-10-CM

## 2020-12-25 DIAGNOSIS — I491 Atrial premature depolarization: Secondary | ICD-10-CM | POA: Diagnosis not present

## 2020-12-25 DIAGNOSIS — E782 Mixed hyperlipidemia: Secondary | ICD-10-CM | POA: Diagnosis not present

## 2020-12-25 DIAGNOSIS — R002 Palpitations: Secondary | ICD-10-CM

## 2020-12-25 DIAGNOSIS — I10 Essential (primary) hypertension: Secondary | ICD-10-CM | POA: Diagnosis not present

## 2020-12-25 DIAGNOSIS — Z170 Estrogen receptor positive status [ER+]: Secondary | ICD-10-CM

## 2020-12-25 DIAGNOSIS — C50211 Malignant neoplasm of upper-inner quadrant of right female breast: Secondary | ICD-10-CM

## 2020-12-25 MED ORDER — ROSUVASTATIN CALCIUM 5 MG PO TABS: 5.0000 mg | ORAL_TABLET | ORAL | 3 refills | Status: DC

## 2020-12-25 NOTE — Patient Instructions (Signed)
Medication Instructions:   Your physician recommends that you continue on your current medications as directed. Please refer to the Current Medication list given to you today.  *If you need a refill on your cardiac medications before your next appointment, please call your pharmacy*  Testing/Procedures:  Your physician has requested that you have an echocardiogram. Echocardiography is a painless test that uses sound waves to create images of your heart. It provides your doctor with information about the size and shape of your heart and how well your heart's chambers and valves are working. This procedure takes approximately one hour. There are no restrictions for this procedure.  DO ECHO WITH STRAIN PER DR. Johney Frame   Follow-Up: At [facility], you and your health needs are our priority.  As part of our continuing mission to provide you with exceptional heart care, we have created designated Provider Care Teams.  These Care Teams include your primary Cardiologist (physician) and Advanced Practice Providers (APPs -  Physician Assistants and Nurse Practitioners) who all work together to provide you with the care you need, when you need it.  We recommend signing up for the patient portal called "MyChart".  Sign up information is provided on this After Visit Summary.  MyChart is used to connect with patients for Virtual Visits (Telemedicine).  Patients are able to view lab/test results, encounter notes, upcoming appointments, etc.  Non-urgent messages can be sent to your provider as well.   To learn more about what you can do with MyChart, go to [email or web address].    Your next appointment:   1 year(s)  The format for your next appointment:   In Person  Provider:   Gwyndolyn Kaufman, MD

## 2021-01-20 ENCOUNTER — Ambulatory Visit (HOSPITAL_COMMUNITY): Payer: Medicare PPO | Attending: Internal Medicine

## 2021-01-20 ENCOUNTER — Other Ambulatory Visit: Payer: Self-pay

## 2021-01-20 DIAGNOSIS — Z9221 Personal history of antineoplastic chemotherapy: Secondary | ICD-10-CM

## 2021-01-20 DIAGNOSIS — I491 Atrial premature depolarization: Secondary | ICD-10-CM | POA: Insufficient documentation

## 2021-01-20 DIAGNOSIS — E782 Mixed hyperlipidemia: Secondary | ICD-10-CM | POA: Diagnosis present

## 2021-01-20 DIAGNOSIS — I10 Essential (primary) hypertension: Secondary | ICD-10-CM | POA: Insufficient documentation

## 2021-01-20 DIAGNOSIS — R002 Palpitations: Secondary | ICD-10-CM | POA: Diagnosis present

## 2021-01-20 LAB — ECHOCARDIOGRAM COMPLETE
Area-P 1/2: 4.06 cm2
S' Lateral: 2.9 cm

## 2021-03-29 ENCOUNTER — Other Ambulatory Visit: Payer: Self-pay

## 2021-03-29 ENCOUNTER — Ambulatory Visit: Payer: Medicare PPO | Admitting: Internal Medicine

## 2021-03-29 ENCOUNTER — Encounter: Payer: Self-pay | Admitting: Internal Medicine

## 2021-03-29 VITALS — BP 126/84 | HR 76 | Temp 98.5°F | Resp 18 | Ht 65.0 in | Wt 171.4 lb

## 2021-03-29 DIAGNOSIS — M1711 Unilateral primary osteoarthritis, right knee: Secondary | ICD-10-CM | POA: Diagnosis not present

## 2021-03-29 DIAGNOSIS — R1013 Epigastric pain: Secondary | ICD-10-CM

## 2021-03-29 DIAGNOSIS — Z853 Personal history of malignant neoplasm of breast: Secondary | ICD-10-CM

## 2021-03-29 DIAGNOSIS — E785 Hyperlipidemia, unspecified: Secondary | ICD-10-CM

## 2021-03-29 DIAGNOSIS — M810 Age-related osteoporosis without current pathological fracture: Secondary | ICD-10-CM

## 2021-03-29 DIAGNOSIS — Z86010 Personal history of colonic polyps: Secondary | ICD-10-CM

## 2021-03-29 DIAGNOSIS — K219 Gastro-esophageal reflux disease without esophagitis: Secondary | ICD-10-CM | POA: Diagnosis not present

## 2021-03-29 DIAGNOSIS — R002 Palpitations: Secondary | ICD-10-CM

## 2021-03-29 DIAGNOSIS — R079 Chest pain, unspecified: Secondary | ICD-10-CM

## 2021-03-29 LAB — COMPREHENSIVE METABOLIC PANEL
ALT: 15 U/L (ref 0–35)
AST: 17 U/L (ref 0–37)
Albumin: 4.3 g/dL (ref 3.5–5.2)
Alkaline Phosphatase: 57 U/L (ref 39–117)
BUN: 15 mg/dL (ref 6–23)
CO2: 27 mEq/L (ref 19–32)
Calcium: 9.8 mg/dL (ref 8.4–10.5)
Chloride: 103 mEq/L (ref 96–112)
Creatinine, Ser: 0.8 mg/dL (ref 0.40–1.20)
GFR: 67.11 mL/min (ref >60.00)
Glucose, Bld: 86 mg/dL (ref 70–99)
Potassium: 4.4 mEq/L (ref 3.5–5.1)
Sodium: 137 mEq/L (ref 135–145)
Total Bilirubin: 0.4 mg/dL (ref 0.2–1.2)
Total Protein: 7.1 g/dL (ref 6.0–8.3)

## 2021-03-29 LAB — CBC
HCT: 38.1 % (ref 36.0–46.0)
Hemoglobin: 12.9 g/dL (ref 12.0–15.0)
MCHC: 33.7 g/dL (ref 30.0–36.0)
MCV: 90.6 fl (ref 78.0–100.0)
Platelets: 197 10*3/uL (ref 150.0–400.0)
RBC: 4.21 Mil/uL (ref 3.87–5.11)
RDW: 14 % (ref 11.5–15.5)
WBC: 7.9 10*3/uL (ref 4.0–10.5)

## 2021-03-29 LAB — LDL CHOLESTEROL, DIRECT: Direct LDL: 78 mg/dL

## 2021-03-29 LAB — LIPID PANEL
Cholesterol: 157 mg/dL (ref 0–200)
HDL: 54.9 mg/dL (ref >39.00)
NonHDL: 102.19
Total CHOL/HDL Ratio: 3
Triglycerides: 247 mg/dL — ABNORMAL HIGH (ref 0.0–149.0)
VLDL: 49.4 mg/dL — ABNORMAL HIGH (ref 0.0–40.0)

## 2021-03-29 LAB — HEMOGLOBIN A1C: Hgb A1c MFr Bld: 5.8 % (ref 4.6–6.5)

## 2021-03-29 MED ORDER — OMEPRAZOLE 40 MG PO CPDR: 40.0000 mg | DELAYED_RELEASE_CAPSULE | Freq: Every day | ORAL | 3 refills | Status: DC

## 2021-03-29 NOTE — Progress Notes (Signed)
   Subjective:   Patient ID: Rebecca Guzman, female    DOB: [birth year], 87 y.o.   MRN: [id number]  HPI The patient is an 87 YO female coming in for concerns about ongoing medical care.   PMH, [facility], social history reviewed and updated  Review of Systems  Constitutional: Negative.   HENT: Negative.    Eyes: Negative.   Respiratory:  Negative for cough, chest tightness and shortness of breath.   Cardiovascular:  Negative for chest pain, palpitations and leg swelling.  Gastrointestinal:  Negative for abdominal distention, abdominal pain, constipation, diarrhea, nausea and vomiting.  Musculoskeletal:  Positive for arthralgias and back pain.  Skin: Negative.   Neurological: Negative.   Psychiatric/Behavioral: Negative.     Objective:  Physical Exam Constitutional:      Appearance: She is well-developed.  HENT:     Head: Normocephalic and atraumatic.  Cardiovascular:     Rate and Rhythm: Normal rate and regular rhythm.  Pulmonary:     Effort: Pulmonary effort is normal. No respiratory distress.     Breath sounds: Normal breath sounds. No wheezing or rales.  Abdominal:     General: Bowel sounds are normal. There is no distension.     Palpations: Abdomen is soft.     Tenderness: There is no abdominal tenderness. There is no rebound.  Musculoskeletal:     Cervical back: Normal range of motion.  Skin:    General: Skin is warm and dry.  Neurological:     Mental Status: She is alert and oriented to person, place, and time.     Coordination: Coordination normal.    Vitals:   03/29/21 1401  BP: 126/84  Pulse: 76  Resp: 18  Temp: 98.5 F (36.9 C)  TempSrc: Oral  SpO2: 99%  Weight: 171 lb 6.4 oz (77.7 kg)  Height: '5\' 5"'$  (1.651 m)    This visit occurred during the SARS-CoV-2 public health emergency.  Safety protocols were in place, including screening questions prior to the visit, additional usage of staff PPE, and extensive cleaning of exam room while observing appropriate  contact time as indicated for disinfecting solutions.   Assessment & Plan:

## 2021-03-29 NOTE — Patient Instructions (Signed)
We will get the prolia set up for September and then do your physical when due.

## 2021-04-01 NOTE — Assessment & Plan Note (Signed)
Taking prilosec 40 mg daily which is controlling symptoms well. Will continue.

## 2021-04-01 NOTE — Assessment & Plan Note (Signed)
Checking lipid panel today and adjust crestor 5 mg 4 times a week as needed.

## 2021-04-01 NOTE — Assessment & Plan Note (Signed)
Taking metoprolol 25 mg BID and doing wlel.

## 2021-04-01 NOTE — Assessment & Plan Note (Signed)
She had questions regarding family history of cancer and her personal history of polyps and given current recommendations to stop screening around age 87 that she would not need ongoing screening but to monitor and report change in bowels, blood in stool as we would still do colonoscopy for diagnostic purposes if needed.

## 2021-04-01 NOTE — Assessment & Plan Note (Signed)
Currently off medications and doing mammograms for monitoring.

## 2021-04-01 NOTE — Assessment & Plan Note (Signed)
In the setting of several past compression fractures in the spine I would recommend to continue with prolia every 6 months lifelong. She has had conflicting recommendations with her orthopedic telling her to skip years at a time. This is not in line with recommendations on prolia dosing and discussed with patient that stopping prolia causes bone loss especially in the spine in as little as 2 months without medication. She will continue without interruptions with next dose due mid September.

## 2021-04-01 NOTE — Assessment & Plan Note (Signed)
Seeing orthopedic for management.

## 2021-04-06 ENCOUNTER — Telehealth: Payer: Self-pay | Admitting: Orthopaedic Surgery

## 2021-04-06 NOTE — Telephone Encounter (Signed)
Pt called asking for a sooner appt then 9/7. Pt states she is in sever pain and if she can be put on a cancellation list or see another dr sooner then 9/7. Please call pt about this matter at [phone].

## 2021-04-07 ENCOUNTER — Telehealth: Payer: Self-pay | Admitting: Orthopaedic Surgery

## 2021-04-07 ENCOUNTER — Other Ambulatory Visit: Payer: Self-pay

## 2021-04-07 ENCOUNTER — Other Ambulatory Visit: Payer: Self-pay | Admitting: Orthopaedic Surgery

## 2021-04-07 MED ORDER — PREDNISONE 10 MG (21) PO TBPK: ORAL_TABLET | ORAL | 0 refills | Status: DC

## 2021-04-07 NOTE — Progress Notes (Signed)
re

## 2021-04-07 NOTE — Telephone Encounter (Signed)
Pt called requesting a call back from [place]. Pt states she need advice what to do about her pains until upcoming appt. Please call pt at [phone]

## 2021-04-14 ENCOUNTER — Ambulatory Visit: Payer: Medicare PPO | Admitting: Surgery

## 2021-04-14 ENCOUNTER — Ambulatory Visit: Payer: Self-pay

## 2021-04-14 ENCOUNTER — Encounter: Payer: Self-pay | Admitting: Surgery

## 2021-04-14 VITALS — BP 162/81 | HR 96 | Ht 65.0 in | Wt 171.4 lb

## 2021-04-14 DIAGNOSIS — M25552 Pain in left hip: Secondary | ICD-10-CM

## 2021-04-14 DIAGNOSIS — M25559 Pain in unspecified hip: Secondary | ICD-10-CM

## 2021-04-14 DIAGNOSIS — M1612 Unilateral primary osteoarthritis, left hip: Secondary | ICD-10-CM

## 2021-04-14 MED ORDER — TRIAMCINOLONE ACETONIDE 40 MG/ML IJ SUSP
60.0000 mg | INTRAMUSCULAR | Status: AC | PRN
  Administered 2021-04-14: 60 mg via INTRA_ARTICULAR

## 2021-04-14 MED ORDER — BUPIVACAINE HCL 0.25 % IJ SOLN
4.0000 mL | INTRAMUSCULAR | Status: AC | PRN
  Administered 2021-04-14: 4 mL via INTRA_ARTICULAR

## 2021-04-14 NOTE — Progress Notes (Signed)
Rebecca Guzman - 87 y.o. female MRN [id number]  Date of birth: 01/23/1934  Office Visit Note: Visit Date: 04/14/2021 PCP: Hoyt Koch, MD Referred by: Hoyt Koch, *  Subjective: Chief Complaint  Patient presents with   Left Hip - Pain   HPI:  Rebecca Guzman is a 87 y.o. female who comes in today at the request of Benjiman Core, PA-C for planned Left anesthetic hip arthrogram with fluoroscopic guidance.  The patient has failed conservative care including home exercise, medications, time and activity modification.  This injection will be diagnostic and hopefully therapeutic.  Please see requesting physician notes for further details and justification.   ROS Otherwise per HPI.  Assessment & Plan: Visit Diagnoses:    ICD-10-CM   1. Hip pain  M25.559 XR HIP UNILAT W OR W/O PELVIS 2-3 VIEWS LEFT    2. Arthritis of left hip  M16.12       Plan: No additional findings.   Meds & Orders: No orders of the defined types were placed in this encounter.   Orders Placed This Encounter  Procedures   XR HIP UNILAT W OR W/O PELVIS 2-3 VIEWS LEFT    Follow-up: Return for as scheduled with dr Lorin Mercy to discuss right total knee replacement.   Procedures: Large Joint Inj: L hip joint on 04/14/2021 10:29 AM Indications: diagnostic evaluation and pain Details: 22 G 3.5 in needle, fluoroscopy-guided anterior approach  Arthrogram: No  Medications: 4 mL bupivacaine 0.25 %; 60 mg triamcinolone acetonide 40 MG/ML Outcome: tolerated well, no immediate complications  There was excellent flow of contrast producing a partial arthrogram of the hip. The patient did have relief of symptoms during the anesthetic phase of the injection. Procedure, treatment alternatives, risks and benefits explained, specific risks discussed. Consent was given by the patient. Immediately prior to procedure a time out was called to verify the correct patient, procedure, equipment, support staff and site/side  marked as required. Patient was prepped and draped in the usual sterile fashion.         Clinical History: MRI LUMBAR SPINE WITHOUT CONTRAST   TECHNIQUE: Multiplanar, multisequence MR imaging of the lumbar spine was performed. No intravenous contrast was administered.   COMPARISON:  Lumbar spine radiographs 04/06/2015   FINDINGS: Moderate lumbar levoscoliosis is again seen with apex at L2-3. There is trace retrolisthesis of L1 on L2 and L2 on L3 and trace anterolisthesis of L4 on L5. There is at most minimal right-sided vertebral body height loss at L3 which is likely related to the scoliosis. No frank compression fracture or marrow edema suggestive of recent osseous injury is identified.   There is minimal right-sided degenerative endplate edema at [id number] and L3-4 associated with asymmetric right-sided disc space height loss at these levels. There is also mild-to-moderate disc space narrowing at L1-2 and L5-S1. Disc desiccation is present throughout the lumbar spine.   The conus medullaris is normal in signal and terminates at L1. There is slight fullness of the renal collecting systems and ureters bilaterally, incompletely evaluated but may be physiologic given partially visualized mild bladder distention.   L1-2: Mild disc bulging and endplate spurring result in minimal right lateral recess narrowing without spinal canal or neural foraminal stenosis.   L2-3: Mild disc bulging, endplate spurring, and asymmetric right facet arthrosis result in minimal right lateral recess and minimal right neural foraminal stenosis without spinal stenosis.   L3-4: Disc bulging asymmetric to the right and moderate right and mild left facet  arthrosis result in moderate right and mild left lateral recess stenosis and moderate to severe right neural foraminal stenosis without spinal stenosis. There is the potential for the right-sided L3 and/or L4 nerve root impingement.   L4-5: Listhesis  with disc uncovering and advanced left greater than right facet arthrosis result in moderate spinal stenosis, mild right and moderate left lateral recess stenosis, and at most minimal bilateral neural foraminal stenosis.   L5-S1: Disc bulging, small left paracentral disc extrusion with mild inferior migration in the lateral recess, and mild right and severe left facet arthrosis result in mild right and moderate to severe left lateral recess stenosis and mild left neural foraminal stenosis without spinal stenosis.   IMPRESSION: 1. Advanced L4-5 facet arthrosis with grade 1 anterolisthesis, moderate spinal stenosis, and left greater than right lateral recess stenosis. 2. Moderate right lateral recess stenosis and moderate to severe right neural foraminal stenosis at L3-4. 3. Moderate lumbar levoscoliosis.     Electronically Signed   By: Logan Bores M.D.   On: 10/15/2015 08:42     Objective:  VS:  HT:'5\' 5"'$  (165.1 cm)   WT:171 lb 6.4 oz (77.7 kg)  BMI:28.52    BP:(!) 162/81  HR:96bpm  TEMP: ( )  RESP:  Physical Exam   Imaging: No results found.

## 2021-04-14 NOTE — Progress Notes (Signed)
Office Visit Note   Patient: Rebecca Guzman           Date of Birth: 09/27/1933           MRN: [id number] Visit Date: 04/14/2021              Requested by: Hoyt Koch, MD [address] [place],  [place] [address] PCP: Hoyt Koch, MD   Assessment & Plan: Visit Diagnoses:  1. Hip pain   2. Arthritis of left hip     Plan: Since patient localizes her pain to the left groin I asked Dr. Ernestina Patches to do a diagnostic/therapeutic left hip intra-articular injection.  He was able to work patient in this morning.  Patient will pay close attention how she feels.  Keep follow-up appoint with Dr. Lorin Mercy as scheduled to discuss right total knee replacement and he can also see how her left hip is doing at that time.  Follow-Up Instructions: Return for as scheduled with dr Lorin Mercy to discuss right total knee replacement.   Orders:  Orders Placed This Encounter  Procedures   Large Joint Inj: L hip joint   XR HIP UNILAT W OR W/O PELVIS 2-3 VIEWS LEFT   XR C-ARM NO REPORT   No orders of the defined types were placed in this encounter.     Procedures: No procedures performed   Clinical Data: No additional findings.   Subjective: Chief Complaint  Patient presents with   Left Hip - Pain    HPI 87 year old white female comes in today with complaints of left hip pain.  Patient localizes pain to the groin and this has been ongoing about 2-1/2 weeks.  No injury.  Patient has known history of end-stage DJD right knee and has been followed by Dr. Lorin Mercy for this previously.  States that she has an upcoming appointment scheduled with him to discuss scheduling right total knee replacement.  No problems with left groin pain before onset.  Denies injury.  No lumbar spine or radicular component.  Groin pain when she is ambulating.  Patient called the office March 28, 2021 stating that she was having this problem with Dr. Lorin Mercy called in a prednisone taper.  States that this gave  minimal relief for a couple days but then pain started back up again. Review of Systems No current cardiac pulmonary GI GU issues  Objective: Vital Signs: BP (!) 162/81   Pulse 96   Ht '5\' 5"'$  (1.651 m)   Wt 171 lb 6.4 oz (77.7 kg)   LMP 11/21/1985   BMI 28.52 kg/m   Physical Exam HENT:     Head: Normocephalic.  Eyes:     Extraocular Movements: Extraocular movements intact.  Musculoskeletal:     Comments: Gait is antalgic.  She is nontender over the left hip greater trochanter bursa.  Left groin pain with about 10 to 15 degrees internal/external rotation.  Right hip unremarkable.  Negative straight leg raise.  Neurological:     Mental Status: She is alert.  Psychiatric:        Mood and Affect: Mood normal.    Ortho Exam  Specialty Comments:  No specialty comments available.  Imaging: XR C-ARM NO REPORT  Result Date: 04/14/2021 Please see Notes tab for imaging impression.    PMFS History: Patient Active Problem List   Diagnosis Date Noted   Genetic testing 06/07/2018   History of breast cancer 04/19/2018   Wears hearing aid    Wears glasses  Vaginal vault prolapse    Hiatal hernia    BPPV (benign paroxysmal positional vertigo)    Palpitations 10/31/2016   Radicular pain of right lower back 04/06/2015   Compression fracture of L3 lumbar vertebra 04/06/2015   Senile osteoporosis 04/06/2015   DDD (degenerative disc disease), lumbar 04/06/2015   Other malaise and fatigue 01/17/2014   Hx of adenomatous colonic polyps 07/11/2013   Osteoarthritis of right knee    Hyperlipidemia LDL goal <100    Bladder prolapse, female, acquired    Esophageal reflux 10/12/2011   Past Medical History:  Diagnosis Date   Asthma    BPPV (benign paroxysmal positional vertigo)    Compression fracture of L3 lumbar vertebra 04/06/2015   Family history of breast cancer    Family history of colon cancer    Family history of pancreatic cancer    GERD (gastroesophageal reflux disease)     Hiatal hernia    pt states she doesnt have this   History of adenomatous polyp of colon    tubular adenoma 2005 and  2014 tubular adenoma and  hyperplastic polyp   History of breast cancer per pt no recurrence   dx 1992 --  s/p  left breast mastectomy and chemotherapy   Hyperlipidemia LDL goal <100    Osteoarthritis of right knee    Personal history of chemotherapy 1992   PONV (postoperative nausea and vomiting)    and "gets weak and light-leaded"   Sensation of pressure in bladder area    Vaginal vault prolapse    anterior   Wears glasses    Wears hearing aid    left only    Family History  Problem Relation Age of Onset   Heart attack Mother    Colon cancer Mother 74   Hypertension Mother    Heart disease Father    Breast cancer Sister        pat half sister dx in her 37s   Pancreatic cancer Other 85    Past Surgical History:  Procedure Laterality Date   BREAST BIOPSY Right 04/19/2018   BREAST LUMPECTOMY Right 05/17/2018   BREAST LUMPECTOMY WITH RADIOACTIVE SEED LOCALIZATION Right 05/17/2018   Procedure: RIGHT BREAST LUMPECTOMY WITH RADIOACTIVE SEED LOCALIZATION;  Surgeon: [name], MD;  Location: [facility];  Service: General;  Laterality: Right;   CATARACT EXTRACTION W/ INTRAOCULAR LENS  IMPLANT, BILATERAL  2014   CHOLECYSTECTOMY  1985   COLONOSCOPY  last one 06-04-2013   CYSTOCELE REPAIR N/A 02/29/2016   Procedure: ANTERIOR VAULT PROLAPSE REPAIR COLOPLAST SACROSPINUS FIXATION AUGMENTED AXIS DERMIS REPAIR  ;  Surgeon: Carolan Clines, MD;  Location: [facility];  Service: Urology;  Laterality: N/A;   ESOPHAGOGASTRODUODENOSCOPY  08-31-2009   LAPAROSCOPIC ASSISTED VAGINAL HYSTERECTOMY  01-10-2000   w/ Bilateral Salpingoophorectomy /  Anterior & Posterior repair/  Pubovaginal Sling   MASTECTOMY Left 1992   Social History   Occupational History   Occupation: Retired Product manager: [employer]  Tobacco Use   Smoking status: Former    Years: 13.00     Types: Cigarettes    Quit date: 10/12/1983    Years since quitting: 37.5   Smokeless tobacco: Never  Vaping Use   Vaping Use: Never used  Substance and Sexual Activity   Alcohol use: No   Drug use: No   Sexual activity: Not on file

## 2021-04-19 ENCOUNTER — Other Ambulatory Visit: Payer: Self-pay

## 2021-04-19 ENCOUNTER — Ambulatory Visit (INDEPENDENT_AMBULATORY_CARE_PROVIDER_SITE_OTHER): Payer: Medicare PPO

## 2021-04-19 DIAGNOSIS — M810 Age-related osteoporosis without current pathological fracture: Secondary | ICD-10-CM

## 2021-04-19 DIAGNOSIS — Z23 Encounter for immunization: Secondary | ICD-10-CM | POA: Diagnosis not present

## 2021-04-19 MED ORDER — DENOSUMAB 60 MG/ML ~~LOC~~ SOSY
60.0000 mg | PREFILLED_SYRINGE | Freq: Once | SUBCUTANEOUS | Status: AC
  Administered 2021-04-19: 60 mg via SUBCUTANEOUS

## 2021-04-19 NOTE — Progress Notes (Signed)
Pt came into the office to receive her Prolia injection. She tolerated the injection well. She also received her flu shot as well.

## 2021-04-22 ENCOUNTER — Other Ambulatory Visit: Payer: Self-pay

## 2021-04-22 ENCOUNTER — Ambulatory Visit
Admission: RE | Admit: 2021-04-22 | Discharge: 2021-04-22 | Disposition: A | Discharge status: Home / Self Care | Payer: Medicare PPO | Source: Ambulatory Visit | Attending: Hematology and Oncology | Admitting: Hematology and Oncology

## 2021-04-22 DIAGNOSIS — C50211 Malignant neoplasm of upper-inner quadrant of right female breast: Secondary | ICD-10-CM

## 2021-04-26 ENCOUNTER — Other Ambulatory Visit: Payer: Self-pay | Admitting: *Deleted

## 2021-04-26 DIAGNOSIS — E782 Mixed hyperlipidemia: Secondary | ICD-10-CM

## 2021-04-26 DIAGNOSIS — I493 Ventricular premature depolarization: Secondary | ICD-10-CM

## 2021-04-26 DIAGNOSIS — E785 Hyperlipidemia, unspecified: Secondary | ICD-10-CM

## 2021-04-26 DIAGNOSIS — R002 Palpitations: Secondary | ICD-10-CM

## 2021-04-26 MED ORDER — METOPROLOL TARTRATE 25 MG PO TABS: ORAL_TABLET | ORAL | 3 refills | Status: DC

## 2021-04-30 ENCOUNTER — Ambulatory Visit: Payer: Medicare PPO | Admitting: Orthopaedic Surgery

## 2021-04-30 ENCOUNTER — Encounter: Payer: Self-pay | Admitting: Orthopaedic Surgery

## 2021-04-30 VITALS — BP 122/72 | HR 93 | Ht 65.0 in | Wt 163.0 lb

## 2021-04-30 DIAGNOSIS — M1711 Unilateral primary osteoarthritis, right knee: Secondary | ICD-10-CM | POA: Diagnosis not present

## 2021-04-30 NOTE — Progress Notes (Signed)
Office Visit Note   Patient: Rebecca Guzman           Date of Birth: 08-Sep-1933           MRN: [id number] Visit Date: 04/30/2021              Requested by: Hoyt Koch, MD [address] [place],  [place] [address] PCP: Hoyt Koch, MD   Assessment & Plan: Visit Diagnoses:  1. Unilateral primary osteoarthritis, right knee     Plan: Patient like proceed with total knee arthroplasty of the right knee.  We discussed procedure including spinal anesthesia, abductor block, Exparel Marcaine postoperatively.  Overnight stay versus 1 extra night if she was having problems with progression with safe ambulation.  Operative technique discussed risk discussed.  Questions were elicited and answered she understands request to proceed.  Follow-Up Instructions: No follow-ups on file.   Orders:  No orders of the defined types were placed in this encounter.  No orders of the defined types were placed in this encounter.     Procedures: No procedures performed   Clinical Data: No additional findings.   Subjective: Chief Complaint  Patient presents with   Right Knee - Pain   Left Hip - Follow-up    HPI 87 year old female returns with progressive right knee osteoarthritis with marginal osteophyte subchondral sclerosis bone-on-bone changes medial compartment.  She states with the right knee limping she had increased pain in her left hip left hip injection helped somewhat.  Started feeling better a few days later.  She has lumbar scoliosis with some foraminal narrowing multilevels.  Patient states right knee is progressed and she wants to proceed with right total knee arthroplasty. Previous knee injections last year and earlier this year with temporary relief.  Last injection did not give her relief as the previous ones have.  She relates problems with narcotics with nausea problems with morphine, allergy and stomach problems with nausea and vomiting with codeine.  No  current pulmonary GI or GU issues.  Review of Systems previous history of osteopenia, L3 compression fracture.  Upper hyperlipidemia.  Breast cancer 2019.  All other systems noncontributory to HPI.   Objective: Vital Signs: BP 122/72   Pulse 93   Ht 5\' 5"  (1.651 m)   Wt 163 lb (73.9 kg)   LMP 11/21/1985   BMI 27.12 kg/m   Physical Exam Constitutional:      Appearance: She is well-developed.  HENT:     Head: Normocephalic.     Right Ear: External ear normal.     Left Ear: External ear normal. There is no impacted cerumen.  Eyes:     Pupils: Pupils are equal, round, and reactive to light.  Neck:     Thyroid: No thyromegaly.     Trachea: No tracheal deviation.  Cardiovascular:     Rate and Rhythm: Normal rate.  Pulmonary:     Effort: Pulmonary effort is normal.  Abdominal:     Palpations: Abdomen is soft.  Musculoskeletal:     Cervical back: No rigidity.  Skin:    General: Skin is warm and dry.  Neurological:     Mental Status: She is alert and oriented to person, place, and time.  Psychiatric:        Behavior: Behavior normal.    Ortho Exam patient is amatory for left knee limp.  No pain with internal or external rotation left hip.  Medial joint line tenderness 2+ knee effusion crepitus with  knee range of motion.  Some difficulty getting from sitting to standing.  Specialty Comments:  No specialty comments available.  Imaging: Previous radiographs 06/05/2020 showed right knee medial joint line narrowing 2 mm or less joint space with marginal osteophyte subchondral sclerosis.  Patellofemoral degenerative changes.   PMFS History: Patient Active Problem List   Diagnosis Date Noted   Genetic testing 06/07/2018   History of breast cancer 04/19/2018   Wears hearing aid    Wears glasses    Vaginal vault prolapse    Hiatal hernia    BPPV (benign paroxysmal positional vertigo)    Palpitations 10/31/2016   Radicular pain of right lower back 04/06/2015   Compression  fracture of L3 lumbar vertebra 04/06/2015   Senile osteoporosis 04/06/2015   DDD (degenerative disc disease), lumbar 04/06/2015   Other malaise and fatigue 01/17/2014   Hx of adenomatous colonic polyps 07/11/2013   Unilateral primary osteoarthritis, right knee    Hyperlipidemia LDL goal <100    Bladder prolapse, female, acquired    Esophageal reflux 10/12/2011   Past Medical History:  Diagnosis Date   Asthma    BPPV (benign paroxysmal positional vertigo)    Compression fracture of L3 lumbar vertebra 04/06/2015   Family history of breast cancer    Family history of colon cancer    Family history of pancreatic cancer    GERD (gastroesophageal reflux disease)    Hiatal hernia    pt states she doesnt have this   History of adenomatous polyp of colon    tubular adenoma 2005 and  2014 tubular adenoma and  hyperplastic polyp   History of breast cancer per pt no recurrence   dx 1992 --  s/p  left breast mastectomy and chemotherapy   Hyperlipidemia LDL goal <100    Osteoarthritis of right knee    Personal history of chemotherapy 1992   PONV (postoperative nausea and vomiting)    and "gets weak and light-leaded"   Sensation of pressure in bladder area    Vaginal vault prolapse    anterior   Wears glasses    Wears hearing aid    left only    Family History  Problem Relation Age of Onset   Heart attack Mother    Colon cancer Mother 82   Hypertension Mother    Heart disease Father    Breast cancer Sister        pat half sister dx in her 78s   Pancreatic cancer Other 44    Past Surgical History:  Procedure Laterality Date   BREAST BIOPSY Right 04/19/2018   BREAST LUMPECTOMY Right 05/17/2018   BREAST LUMPECTOMY WITH RADIOACTIVE SEED LOCALIZATION Right 05/17/2018   Procedure: RIGHT BREAST LUMPECTOMY WITH RADIOACTIVE SEED LOCALIZATION;  Surgeon: [name], MD;  Location: [facility];  Service: General;  Laterality: Right;   CATARACT EXTRACTION W/ INTRAOCULAR LENS  IMPLANT, BILATERAL   2014   CHOLECYSTECTOMY  1985   COLONOSCOPY  last one 06-04-2013   CYSTOCELE REPAIR N/A 02/29/2016   Procedure: ANTERIOR VAULT PROLAPSE REPAIR COLOPLAST [place]  ;  Surgeon: Carolan Clines, MD;  Location: [facility];  Service: Urology;  Laterality: N/A;   ESOPHAGOGASTRODUODENOSCOPY  08-31-2009   LAPAROSCOPIC ASSISTED VAGINAL HYSTERECTOMY  01-10-2000   w/ Bilateral Salpingoophorectomy /  Anterior & Posterior repair/  Pubovaginal Sling   MASTECTOMY Left 1992   Social History   Occupational History   Occupation: Retired Product manager: [employer]  Use   Smoking status: Former    Years: 13.00    Types: Cigarettes    Quit date: 10/12/1983    Years since quitting: 37.5   Smokeless tobacco: Never  Vaping Use   Vaping Use: Never used  Substance and Sexual Activity   Alcohol use: No   Drug use: No   Sexual activity: Not on file

## 2021-05-04 ENCOUNTER — Other Ambulatory Visit: Payer: Self-pay

## 2021-05-12 ENCOUNTER — Ambulatory Visit (INDEPENDENT_AMBULATORY_CARE_PROVIDER_SITE_OTHER): Payer: Medicare PPO | Admitting: Surgery

## 2021-05-12 ENCOUNTER — Other Ambulatory Visit: Payer: Self-pay

## 2021-05-12 ENCOUNTER — Encounter: Payer: Self-pay | Admitting: Surgery

## 2021-05-12 VITALS — BP 155/77 | HR 89 | Temp 98.9°F | Resp 17 | Ht 64.5 in | Wt 170.2 lb

## 2021-05-12 DIAGNOSIS — M1711 Unilateral primary osteoarthritis, right knee: Secondary | ICD-10-CM

## 2021-05-12 NOTE — Progress Notes (Signed)
87 year old white female with history of end-stage DJD right knee and pain comes in for preop evaluation.  States that knee symptoms unchanged from previous visit.  She is wanting to proceed with right total knee replacement as scheduled.  Today history and physical performed.  Review of systems negative.  Preop medical and cardiac clearance not requested by Dr. Lorin Mercy.  Dr. Gwyndolyn Kaufman is patient's cardiologist and was last seen Dec 25, 2020.  Had echocardiogram and EKG.  That is documented in patient's chart.  Today review of systems negative.  Surgical procedure discussed along with potential rehab/recovery time.  All questions answered.

## 2021-05-13 DIAGNOSIS — N8182 Incompetence or weakening of pubocervical tissue: Secondary | ICD-10-CM | POA: Diagnosis not present

## 2021-05-17 NOTE — Pre-Procedure Instructions (Signed)
Surgical Instructions    Your procedure is scheduled on Friday, October 14th, 2022.  Report to [facility] Main Entrance "A" at 05:30 A.M., then check in with the Admitting office.  Call this number if you have problems the morning of surgery:  [phone]   If you have any questions prior to your surgery date call [phone]: Open Monday-Friday 8am-4pm    Remember:  Do not eat after midnight the night before your surgery  You may drink clear liquids until 04:30 A.M. the morning of your surgery.   Clear liquids allowed are: Water, Non-Citrus Juices (without pulp), Carbonated Beverages, Clear Tea, Black Coffee ONLY (NO MILK, CREAM OR POWDERED CREAMER of any kind), and Gatorade    Enhanced Recovery after Surgery for Orthopedics Enhanced Recovery after Surgery is a protocol used to improve the stress on your body and your recovery after surgery.  Patient Instructions  The day of surgery (if you do NOT have diabetes):  Drink ONE (1) Pre-Surgery Clear Ensure by 04:30 am the morning of surgery   This drink was given to you during your hospital  pre-op appointment visit. Nothing else to drink after completing the  Pre-Surgery Clear Ensure.         If you have questions, please contact your surgeon's office.     Take these medicines the morning of surgery with A SIP OF WATER: acetaminophen (TYLENOL)  fexofenadine (ALLEGRA)  metoprolol tartrate (LOPRESSOR) omeprazole (PRILOSEC)  rosuvastatin (CRESTOR)   As of today, STOP taking any Aspirin (unless otherwise instructed by your surgeon) Aleve, Naproxen, Ibuprofen, Motrin, Advil, Goody's, BC's, all herbal medications, fish oil, and all vitamins.    After your COVID test   You are not required to quarantine however you are required to wear a well-fitting mask when you are out and around people not in your household.  If your mask becomes wet or soiled, replace with a new one.  Wash your hands often with soap and water for 20  seconds or clean your hands with an alcohol-based hand sanitizer that contains at least 60% alcohol.  Do not share personal items.  Notify your provider: if you are in close contact with someone who has COVID  or if you develop a fever of 100.4 or greater, sneezing, cough, sore throat, shortness of breath or body aches.           Do not wear jewelry or makeup Do not wear lotions, powders, perfumes, or deodorant. Do not shave 48 hours prior to surgery.   Do not bring valuables to the hospital. DO Not wear nail polish, gel polish, artificial nails, or any other type of covering on natural nails including finger and toenails. If patients have artificial nails, gel coating, etc. that need to be removed by a nail salon, please have this removed prior to surgery or surgery may need to be canceled/delayed if the surgeon/ anesthesia feels like the patient is unable to be adequately monitored.             [facility] is not responsible for any belongings or valuables.  Do NOT Smoke (Tobacco/Vaping)  24 hours prior to your procedure  If you use a CPAP at night, you may bring your mask for your overnight stay.   Contacts, glasses, hearing aids, dentures or partials may not be worn into surgery, please bring cases for these belongings   For patients admitted to the hospital, discharge time will be determined by your treatment team.   Patients discharged  the day of surgery will not be allowed to drive home, and someone needs to stay with them for 24 hours.  NO VISITORS WILL BE ALLOWED IN PRE-OP WHERE PATIENTS ARE PREPPED FOR SURGERY.  ONLY 1 SUPPORT PERSON MAY BE PRESENT IN THE WAITING ROOM WHILE YOU ARE IN SURGERY.  IF YOU ARE TO BE ADMITTED, ONCE YOU ARE IN YOUR ROOM YOU WILL BE ALLOWED TWO (2) VISITORS. 1 (ONE) VISITOR MAY STAY OVERNIGHT BUT MUST ARRIVE TO THE ROOM BY 8pm.  Minor children may have two parents present. Special consideration for safety and communication needs will be reviewed on a  case by case basis.  Special instructions:    Oral Hygiene is also important to reduce your risk of infection.  Remember - BRUSH YOUR TEETH THE MORNING OF SURGERY WITH YOUR REGULAR TOOTHPASTE   [facility]- Preparing For Surgery  Before surgery, you can play an important role. Because skin is not sterile, your skin needs to be as free of germs as possible. You can reduce the number of germs on your skin by washing with CHG (chlorahexidine gluconate) Soap before surgery.  CHG is an antiseptic cleaner which kills germs and bonds with the skin to continue killing germs even after washing.     Please do not use if you have an allergy to CHG or antibacterial soaps. If your skin becomes reddened/irritated stop using the CHG.  Do not shave (including legs and underarms) for at least 48 hours prior to first CHG shower. It is OK to shave your face.  Please follow these instructions carefully.     Shower the NIGHT BEFORE SURGERY and the MORNING OF SURGERY with CHG Soap.   If you chose to wash your hair, wash your hair first as usual with your normal shampoo. After you shampoo, rinse your hair and body thoroughly to remove the shampoo.  Then [employer] and genitals (private parts) with your normal soap and rinse thoroughly to remove soap.  After that Use CHG Soap as you would any other liquid soap. You can apply CHG directly to the skin and wash gently with a scrungie or a clean washcloth.   Apply the CHG Soap to your body ONLY FROM THE NECK DOWN.  Do not use on open wounds or open sores. Avoid contact with your eyes, ears, mouth and genitals (private parts). Wash Face and genitals (private parts)  with your normal soap.   Wash thoroughly, paying special attention to the area where your surgery will be performed.  Thoroughly rinse your body with warm water from the neck down.  DO NOT shower/wash with your normal soap after using and rinsing off the CHG Soap.  Pat yourself dry with a CLEAN  TOWEL.  Wear CLEAN PAJAMAS to bed the night before surgery  Place CLEAN SHEETS on your bed the night before your surgery  DO NOT SLEEP WITH PETS.   Day of Surgery:  Take a shower with CHG soap. Wear Clean/Comfortable clothing the morning of surgery Do not apply any deodorants/lotions.   Remember to brush your teeth WITH YOUR REGULAR TOOTHPASTE.   Please read over the following fact sheets that you were given.

## 2021-05-18 ENCOUNTER — Encounter (HOSPITAL_COMMUNITY)
Admission: RE | Admit: 2021-05-18 | Discharge: 2021-05-18 | Disposition: A | Discharge status: Home / Self Care | Payer: Medicare PPO | Source: Ambulatory Visit | Attending: Orthopaedic Surgery | Admitting: Orthopaedic Surgery

## 2021-05-18 ENCOUNTER — Other Ambulatory Visit: Payer: Self-pay

## 2021-05-18 ENCOUNTER — Encounter (HOSPITAL_COMMUNITY): Payer: Self-pay

## 2021-05-18 DIAGNOSIS — Z01818 Encounter for other preprocedural examination: Secondary | ICD-10-CM | POA: Diagnosis not present

## 2021-05-18 DIAGNOSIS — R002 Palpitations: Secondary | ICD-10-CM | POA: Insufficient documentation

## 2021-05-18 DIAGNOSIS — Z853 Personal history of malignant neoplasm of breast: Secondary | ICD-10-CM | POA: Diagnosis not present

## 2021-05-18 DIAGNOSIS — J45909 Unspecified asthma, uncomplicated: Secondary | ICD-10-CM | POA: Insufficient documentation

## 2021-05-18 DIAGNOSIS — Z79899 Other long term (current) drug therapy: Secondary | ICD-10-CM | POA: Insufficient documentation

## 2021-05-18 DIAGNOSIS — M1711 Unilateral primary osteoarthritis, right knee: Secondary | ICD-10-CM | POA: Insufficient documentation

## 2021-05-18 HISTORY — DX: Malignant (primary) neoplasm, unspecified: C80.1

## 2021-05-18 LAB — CBC
HCT: 39.3 % (ref 36.0–46.0)
Hemoglobin: 13.2 g/dL (ref 12.0–15.0)
MCH: 31.1 pg (ref 26.0–34.0)
MCHC: 33.6 g/dL (ref 30.0–36.0)
MCV: 92.7 fL (ref 80.0–100.0)
Platelets: 245 10*3/uL (ref 150–400)
RBC: 4.24 MIL/uL (ref 3.87–5.11)
RDW: 13.7 % (ref 11.5–15.5)
WBC: 7.2 10*3/uL (ref 4.0–10.5)
nRBC: 0 % (ref 0.0–0.2)

## 2021-05-18 LAB — COMPREHENSIVE METABOLIC PANEL
ALT: 18 U/L (ref 0–44)
AST: 21 U/L (ref 15–41)
Albumin: 3.7 g/dL (ref 3.5–5.0)
Alkaline Phosphatase: 57 U/L (ref 38–126)
Anion gap: 5 (ref 5–15)
BUN: 14 mg/dL (ref 8–23)
CO2: 28 mmol/L (ref 22–32)
Calcium: 9.1 mg/dL (ref 8.9–10.3)
Chloride: 106 mmol/L (ref 98–111)
Creatinine, Ser: 0.68 mg/dL (ref 0.44–1.00)
GFR, Estimated: >60 mL/min (ref >60)
Glucose, Bld: 88 mg/dL (ref 70–99)
Potassium: 3.9 mmol/L (ref 3.5–5.1)
Sodium: 139 mmol/L (ref 135–145)
Total Bilirubin: 0.7 mg/dL (ref 0.3–1.2)
Total Protein: 6.6 g/dL (ref 6.5–8.1)

## 2021-05-18 LAB — SURGICAL PCR SCREEN
MRSA, PCR: NEGATIVE
Staphylococcus aureus: NEGATIVE

## 2021-05-18 NOTE — Progress Notes (Signed)
PCP - Dr. Pricilla Holm Cardiologist - Dr. Gwyndolyn Kaufman  PPM/ICD - denies  Chest x-ray - 11/23/2001 EKG - 05/18/21 at PAT appt Stress Test - "years ago" per pt ECHO - 01/20/21 Cardiac Cath - denies  Sleep Study - denies   DM- denies  Blood Thinner Instructions: n/a Aspirin Instructions: n/a  ERAS Protcol - yes PRE-SURGERY Ensure given  COVID TEST- 05/18/21 at PAT appt, results pending   Anesthesia review: yes, cardiac clearance needed?  Patient denies shortness of breath, fever, cough and chest pain at PAT appointment   All instructions explained to the patient, with a verbal understanding of the material. Patient agrees to go over the instructions while at home for a better understanding. Patient also instructed to wear a mask in public after being tested for COVID-19. The opportunity to ask questions was provided.

## 2021-05-19 NOTE — Anesthesia Preprocedure Evaluation (Addendum)
Anesthesia Evaluation  Patient identified by MRN, date of birth, ID band Patient awake    Reviewed: Allergy & Precautions, NPO status , Patient's Chart, lab work & pertinent test results, reviewed documented beta blocker date and time   History of Anesthesia Complications (+) PONV and history of anesthetic complications  Airway Mallampati: I  TM Distance: >3 FB Neck ROM: Full    Dental  (+) Teeth Intact, Dental Advisory Given   Pulmonary asthma , former smoker,    breath sounds clear to auscultation       Cardiovascular + dysrhythmias  Rhythm:Regular Rate:Normal  Echo:  1. Left ventricular ejection fraction, by estimation, is 55 to 60%. Left  ventricular ejection fraction by 3D volume is 58 %. The left ventricle has  normal function. The left ventricle has no regional wall motion  abnormalities. Left ventricular diastolic  parameters are consistent with Grade I diastolic dysfunction (impaired  relaxation). Elevated left ventricular end-diastolic pressure. The E/e' is  58. The average left ventricular global longitudinal strain is -20.5 %.  The global longitudinal strain is  normal.  2. Right ventricular systolic function is normal. The right ventricular  size is normal. There is normal pulmonary artery systolic pressure. The  estimated right ventricular systolic pressure is 50.0 mmHg.  3. The mitral valve is degenerative. Trivial mitral valve regurgitation.  4. The aortic valve is tricuspid. Aortic valve regurgitation is not  visualized. Mild aortic valve sclerosis is present, with no evidence of  aortic valve stenosis.  5. The inferior vena cava is normal in size with greater than 50%  respiratory variability, suggesting right atrial pressure of 3 mmHg.    Neuro/Psych negative neurological ROS  negative psych ROS   GI/Hepatic Neg liver ROS, hiatal hernia, GERD  Medicated,  Endo/Other  negative endocrine ROS   Renal/GU negative Renal ROS     Musculoskeletal  (+) Arthritis ,   Abdominal Normal abdominal exam  (+)   Peds  Hematology negative hematology ROS (+)   Anesthesia Other Findings   Reproductive/Obstetrics                           Anesthesia Physical Anesthesia Plan  ASA: 3  Anesthesia Plan: Spinal   Post-op Pain Management:  Regional for Post-op pain   Induction: Intravenous  PONV Risk Score and Plan: 4 or greater and Ondansetron, Dexamethasone and Treatment may vary due to age or medical condition  Airway Management Planned: Natural Airway and Simple Face Mask  Additional Equipment: None  Intra-op Plan:   Post-operative Plan:   Informed Consent: I have reviewed the patients History and Physical, chart, labs and discussed the procedure including the risks, benefits and alternatives for the proposed anesthesia with the patient or authorized representative who has indicated his/her understanding and acceptance.     Dental advisory given  Plan Discussed with: CRNA  Anesthesia Plan Comments: (See APP note by Durel Salts, FNP )      Anesthesia Quick Evaluation

## 2021-05-19 NOTE — Progress Notes (Signed)
Anesthesia Chart Review:   Case: [id number] Date/Time: 05/21/21 0715   Procedure: RIGHT TOTAL KNEE ARTHROPLASTY (Right: Knee)   Anesthesia type: Spinal   Pre-op diagnosis: right knee osteoarthritis   Location: [facility] OR ROOM 04 / [place] OR   Surgeons: Marybelle Killings, MD       DISCUSSION: Pt is 87 years old with hx palpitations (frequent PACs, on metoprolol), asthma, breast cancer  VS: BP (!) 165/95   Pulse 70   Temp 36.6 C (Oral)   Resp 18   Ht 5\' 5"  (1.651 m)   Wt 77.2 kg   LMP 11/21/1985   SpO2 96%   BMI 28.31 kg/m   PROVIDERS: - PCP is Hoyt Koch, MD - Cardiologist is Gwyndolyn Kaufman, MD. Last office visit 12/25/20   LABS: Labs reviewed: Acceptable for surgery. (all labs ordered are listed, but only abnormal results are displayed)  Labs Reviewed  SURGICAL PCR SCREEN  SARS CORONAVIRUS 2 (TAT 6-24 HRS)  CBC  COMPREHENSIVE METABOLIC PANEL    EKG 11/29/51: NSR   CV: Echo 01/20/21:  1. Left ventricular ejection fraction, by estimation, is 55 to 60%. Left ventricular ejection fraction by 3D volume is 58 %. The left ventricle has normal function. The left ventricle has no regional wall motion abnormalities. Left ventricular diastolic parameters are consistent with Grade I diastolic dysfunction (impaired relaxation). Elevated left ventricular end-diastolic pressure. The E/e' is 76. The average left ventricular global longitudinal strain is -20.5 %. The global longitudinal strain is normal.   2. Right ventricular systolic function is normal. The right ventricular size is normal. There is normal pulmonary artery systolic pressure. The estimated right ventricular systolic pressure is 61.4 mmHg.   3. The mitral valve is degenerative. Trivial mitral valve regurgitation.   4. The aortic valve is tricuspid. Aortic valve regurgitation is not visualized. Mild aortic valve sclerosis is present, with no evidence of aortic valve stenosis.   5. The inferior vena cava is normal in size  with greater than 50% respiratory variability, suggesting right atrial pressure of 3 mmHg.   Holter monitor 11/23/16:   frequent PACs (700 in 34 hrs).  Few very short runs of atrial tachycardia with the longest lasting 4 beats    Past Medical History:  Diagnosis Date   Asthma    BPPV (benign paroxysmal positional vertigo)    Breast cancer ([place])    left mastectomy 1992, right lumpectomy 2019   Cancer ([place])    Compression fracture of L3 lumbar vertebra 04/06/2015   Dysrhythmia 2019   pt states Cardiologist told her it is "pre A-fib"   Family history of breast cancer    Family history of colon cancer    Family history of pancreatic cancer    GERD (gastroesophageal reflux disease)    Hiatal hernia    pt states she doesnt have this   History of adenomatous polyp of colon    tubular adenoma 2005 and  2014 tubular adenoma and  hyperplastic polyp   History of breast cancer per pt no recurrence   dx 1992 --  s/p  left breast mastectomy and chemotherapy   Hyperlipidemia LDL goal <100    Osteoarthritis of right knee    Personal history of chemotherapy 1992   PONV (postoperative nausea and vomiting)    and "gets weak and light-leaded"   Sensation of pressure in bladder area    Vaginal vault prolapse    anterior   Wears glasses    Wears hearing aid  left only    Past Surgical History:  Procedure Laterality Date   BREAST BIOPSY Right 04/19/2018   BREAST LUMPECTOMY Right 05/17/2018   BREAST LUMPECTOMY WITH RADIOACTIVE SEED LOCALIZATION Right 05/17/2018   Procedure: RIGHT BREAST LUMPECTOMY WITH RADIOACTIVE SEED LOCALIZATION;  Surgeon: [name], MD;  Location: [employer];  Service: General;  Laterality: Right;   CATARACT EXTRACTION W/ INTRAOCULAR LENS  IMPLANT, BILATERAL  2014   CHOLECYSTECTOMY  1985   COLONOSCOPY  last one 06-04-2013   CYSTOCELE REPAIR N/A 02/29/2016   Procedure: ANTERIOR VAULT PROLAPSE REPAIR COLOPLAST [place] FIXATION AUGMENTED AXIS DERMIS REPAIR  ;   Surgeon: Carolan Clines, MD;  Location: [place];  Service: Urology;  Laterality: N/A;   ESOPHAGOGASTRODUODENOSCOPY  08-31-2009   LAPAROSCOPIC ASSISTED VAGINAL HYSTERECTOMY  01-10-2000   w/ Bilateral Salpingoophorectomy /  Anterior & Posterior repair/  Pubovaginal Sling   MASTECTOMY Left 1992    MEDICATIONS:  acetaminophen (TYLENOL) 500 MG tablet   Calcium Carbonate-Vit D-Min (CALCIUM 600+D3 PLUS MINERALS) 600-800 MG-UNIT TABS   denosumab (PROLIA) 60 MG/ML SOSY injection   fexofenadine (ALLEGRA) 180 MG tablet   metoprolol tartrate (LOPRESSOR) 25 MG tablet   Multiple Vitamin (MULTIVITAMIN) tablet   Multiple Vitamins-Minerals (PRESERVISION AREDS 2 PO)   Omega-3 Fatty Acids (FISH OIL) 1000 MG CAPS   omeprazole (PRILOSEC) 40 MG capsule   Probiotic Product (PROBIOTIC FORMULA PO)   rosuvastatin (CRESTOR) 5 MG tablet   No current facility-administered medications for this encounter.    If no changes, I anticipate pt can proceed with surgery as scheduled.   Willeen Cass, PhD, FNP-BC [facility]/Anesthesiology Phone: [phone] 05/19/2021 12:02 PM

## 2021-05-21 ENCOUNTER — Encounter (HOSPITAL_COMMUNITY)
Admission: RE | Disposition: A | Discharge status: Home Health | Payer: Self-pay | Source: Home / Self Care | Attending: Orthopaedic Surgery

## 2021-05-21 ENCOUNTER — Encounter (HOSPITAL_COMMUNITY): Payer: Self-pay | Admitting: Orthopaedic Surgery

## 2021-05-21 ENCOUNTER — Inpatient Hospital Stay (HOSPITAL_COMMUNITY)
Admission: RE | Admit: 2021-05-21 | Discharge: 2021-05-24 | DRG: 470 | Disposition: A | Discharge status: Home Health | Payer: Medicare PPO | Attending: Orthopaedic Surgery | Admitting: Orthopaedic Surgery

## 2021-05-21 ENCOUNTER — Ambulatory Visit (HOSPITAL_COMMUNITY): Payer: Medicare PPO | Admitting: Emergency Medicine

## 2021-05-21 ENCOUNTER — Ambulatory Visit (HOSPITAL_COMMUNITY): Payer: Medicare PPO | Admitting: Certified Registered Nurse Anesthetist

## 2021-05-21 ENCOUNTER — Other Ambulatory Visit: Payer: Self-pay

## 2021-05-21 DIAGNOSIS — D62 Acute posthemorrhagic anemia: Secondary | ICD-10-CM | POA: Diagnosis not present

## 2021-05-21 DIAGNOSIS — H8110 Benign paroxysmal vertigo, unspecified ear: Secondary | ICD-10-CM | POA: Diagnosis present

## 2021-05-21 DIAGNOSIS — Z9221 Personal history of antineoplastic chemotherapy: Secondary | ICD-10-CM

## 2021-05-21 DIAGNOSIS — Z9012 Acquired absence of left breast and nipple: Secondary | ICD-10-CM | POA: Diagnosis not present

## 2021-05-21 DIAGNOSIS — Z803 Family history of malignant neoplasm of breast: Secondary | ICD-10-CM | POA: Diagnosis not present

## 2021-05-21 DIAGNOSIS — Z96651 Presence of right artificial knee joint: Secondary | ICD-10-CM

## 2021-05-21 DIAGNOSIS — Z20822 Contact with and (suspected) exposure to covid-19: Secondary | ICD-10-CM | POA: Diagnosis present

## 2021-05-21 DIAGNOSIS — M1711 Unilateral primary osteoarthritis, right knee: Secondary | ICD-10-CM | POA: Diagnosis present

## 2021-05-21 DIAGNOSIS — Z974 Presence of external hearing-aid: Secondary | ICD-10-CM | POA: Diagnosis not present

## 2021-05-21 DIAGNOSIS — Z87891 Personal history of nicotine dependence: Secondary | ICD-10-CM | POA: Diagnosis not present

## 2021-05-21 DIAGNOSIS — Z96659 Presence of unspecified artificial knee joint: Secondary | ICD-10-CM

## 2021-05-21 DIAGNOSIS — I4891 Unspecified atrial fibrillation: Secondary | ICD-10-CM | POA: Diagnosis present

## 2021-05-21 DIAGNOSIS — Z853 Personal history of malignant neoplasm of breast: Secondary | ICD-10-CM

## 2021-05-21 DIAGNOSIS — E119 Type 2 diabetes mellitus without complications: Secondary | ICD-10-CM | POA: Diagnosis present

## 2021-05-21 DIAGNOSIS — Z86010 Personal history of colonic polyps: Secondary | ICD-10-CM | POA: Diagnosis not present

## 2021-05-21 DIAGNOSIS — Z885 Allergy status to narcotic agent status: Secondary | ICD-10-CM

## 2021-05-21 DIAGNOSIS — E785 Hyperlipidemia, unspecified: Secondary | ICD-10-CM | POA: Diagnosis present

## 2021-05-21 DIAGNOSIS — M810 Age-related osteoporosis without current pathological fracture: Secondary | ICD-10-CM | POA: Diagnosis present

## 2021-05-21 DIAGNOSIS — Z01818 Encounter for other preprocedural examination: Secondary | ICD-10-CM

## 2021-05-21 DIAGNOSIS — K219 Gastro-esophageal reflux disease without esophagitis: Secondary | ICD-10-CM | POA: Diagnosis present

## 2021-05-21 DIAGNOSIS — Z800 Family history of malignant neoplasm of digestive organs: Secondary | ICD-10-CM

## 2021-05-21 DIAGNOSIS — Z8249 Family history of ischemic heart disease and other diseases of the circulatory system: Secondary | ICD-10-CM | POA: Diagnosis not present

## 2021-05-21 DIAGNOSIS — G8918 Other acute postprocedural pain: Secondary | ICD-10-CM | POA: Diagnosis not present

## 2021-05-21 DIAGNOSIS — I951 Orthostatic hypotension: Secondary | ICD-10-CM | POA: Diagnosis not present

## 2021-05-21 HISTORY — PX: TOTAL KNEE ARTHROPLASTY: SHX125

## 2021-05-21 LAB — URINALYSIS, ROUTINE W REFLEX MICROSCOPIC
Bilirubin Urine: NEGATIVE
Glucose, UA: NEGATIVE mg/dL
Ketones, ur: NEGATIVE mg/dL
Nitrite: POSITIVE — AB
Protein, ur: NEGATIVE mg/dL
Specific Gravity, Urine: 1.01 (ref 1.005–1.030)
pH: 6 (ref 5.0–8.0)

## 2021-05-21 LAB — SARS CORONAVIRUS 2 BY RT PCR (HOSPITAL ORDER, PERFORMED IN ~~LOC~~ HOSPITAL LAB): SARS Coronavirus 2: NEGATIVE

## 2021-05-21 SURGERY — ARTHROPLASTY, KNEE, TOTAL
Anesthesia: Spinal | Site: Knee | Laterality: Right

## 2021-05-21 MED ORDER — METHOCARBAMOL 1000 MG/10ML IJ SOLN
500.0000 mg | Freq: Four times a day (QID) | INTRAVENOUS | Status: DC | PRN
  Filled 2021-05-21: qty 5

## 2021-05-21 MED ORDER — PROPOFOL 10 MG/ML IV BOLUS
INTRAVENOUS | Status: AC
  Filled 2021-05-21: qty 20

## 2021-05-21 MED ORDER — OXYCODONE HCL 5 MG PO TABS: 10.0000 mg | ORAL_TABLET | ORAL | Status: DC | PRN

## 2021-05-21 MED ORDER — BUPIVACAINE HCL (PF) 0.25 % IJ SOLN
INTRAMUSCULAR | Status: AC
  Filled 2021-05-21: qty 30

## 2021-05-21 MED ORDER — ONDANSETRON HCL 4 MG/2ML IJ SOLN
4.0000 mg | Freq: Four times a day (QID) | INTRAMUSCULAR | Status: DC | PRN
  Administered 2021-05-21 – 2021-05-22 (×2): 4 mg via INTRAVENOUS
  Filled 2021-05-21 (×2): qty 2

## 2021-05-21 MED ORDER — PHENOL 1.4 % MT LIQD: 1.0000 | OROMUCOSAL | Status: DC | PRN

## 2021-05-21 MED ORDER — DEXAMETHASONE SODIUM PHOSPHATE 10 MG/ML IJ SOLN
INTRAMUSCULAR | Status: DC | PRN
  Administered 2021-05-21: 4 mg via INTRAVENOUS

## 2021-05-21 MED ORDER — ADULT MULTIVITAMIN W/MINERALS CH
1.0000 | ORAL_TABLET | Freq: Every day | ORAL | Status: DC
  Administered 2021-05-21 – 2021-05-24 (×4): 1 via ORAL
  Filled 2021-05-21 (×4): qty 1

## 2021-05-21 MED ORDER — SODIUM CHLORIDE 0.9 % IR SOLN
Status: DC | PRN
  Administered 2021-05-21: 3000 mL

## 2021-05-21 MED ORDER — MENTHOL 3 MG MT LOZG: 1.0000 | LOZENGE | OROMUCOSAL | Status: DC | PRN

## 2021-05-21 MED ORDER — PROPOFOL 10 MG/ML IV BOLUS
INTRAVENOUS | Status: DC | PRN
  Administered 2021-05-21: 20 mg via INTRAVENOUS

## 2021-05-21 MED ORDER — HYDROMORPHONE HCL 1 MG/ML IJ SOLN: 0.5000 mg | INTRAMUSCULAR | Status: DC | PRN

## 2021-05-21 MED ORDER — METHOCARBAMOL 500 MG PO TABS: 500.0000 mg | ORAL_TABLET | Freq: Three times a day (TID) | ORAL | 0 refills | Status: DC | PRN

## 2021-05-21 MED ORDER — OXYCODONE HCL 5 MG PO TABS
5.0000 mg | ORAL_TABLET | ORAL | Status: DC | PRN
  Administered 2021-05-22 – 2021-05-23 (×3): 5 mg via ORAL
  Filled 2021-05-21 (×3): qty 1

## 2021-05-21 MED ORDER — ONDANSETRON HCL 4 MG PO TABS: 4.0000 mg | ORAL_TABLET | Freq: Four times a day (QID) | ORAL | Status: DC | PRN

## 2021-05-21 MED ORDER — BUPIVACAINE HCL (PF) 0.25 % IJ SOLN
INTRAMUSCULAR | Status: DC | PRN
  Administered 2021-05-21: 20 mL

## 2021-05-21 MED ORDER — CEFAZOLIN SODIUM-DEXTROSE 2-4 GM/100ML-% IV SOLN
INTRAVENOUS | Status: AC
  Filled 2021-05-21: qty 100

## 2021-05-21 MED ORDER — DIPHENHYDRAMINE HCL 12.5 MG/5ML PO ELIX: 12.5000 mg | ORAL_SOLUTION | ORAL | Status: DC | PRN

## 2021-05-21 MED ORDER — 0.9 % SODIUM CHLORIDE (POUR BTL) OPTIME
TOPICAL | Status: DC | PRN
  Administered 2021-05-21: 1000 mL

## 2021-05-21 MED ORDER — MIDAZOLAM HCL 5 MG/5ML IJ SOLN
INTRAMUSCULAR | Status: DC | PRN
Start: 2021-05-21 — End: 2021-05-21
  Administered 2021-05-21: 1 mg via INTRAVENOUS

## 2021-05-21 MED ORDER — TRANEXAMIC ACID-NACL 1000-0.7 MG/100ML-% IV SOLN
INTRAVENOUS | Status: DC | PRN
  Administered 2021-05-21: 1000 mg via INTRAVENOUS

## 2021-05-21 MED ORDER — METHOCARBAMOL 500 MG PO TABS
ORAL_TABLET | ORAL | Status: AC
  Filled 2021-05-21: qty 1

## 2021-05-21 MED ORDER — TRANEXAMIC ACID-NACL 1000-0.7 MG/100ML-% IV SOLN
INTRAVENOUS | Status: AC
  Filled 2021-05-21: qty 200

## 2021-05-21 MED ORDER — FENTANYL CITRATE (PF) 100 MCG/2ML IJ SOLN
INTRAMUSCULAR | Status: AC
  Filled 2021-05-21: qty 2

## 2021-05-21 MED ORDER — FENTANYL CITRATE (PF) 250 MCG/5ML IJ SOLN
INTRAMUSCULAR | Status: AC
  Filled 2021-05-21: qty 5

## 2021-05-21 MED ORDER — ROSUVASTATIN CALCIUM 5 MG PO TABS
5.0000 mg | ORAL_TABLET | ORAL | Status: DC
  Administered 2021-05-22 – 2021-05-23 (×2): 5 mg via ORAL
  Filled 2021-05-21 (×2): qty 1

## 2021-05-21 MED ORDER — CALCIUM 600+D3 PLUS MINERALS 600-800 MG-UNIT PO TABS: 2.0000 | ORAL_TABLET | Freq: Every day | ORAL | Status: DC

## 2021-05-21 MED ORDER — PHENYLEPHRINE HCL-NACL 20-0.9 MG/250ML-% IV SOLN
INTRAVENOUS | Status: DC | PRN
  Administered 2021-05-21: 25 ug/min via INTRAVENOUS

## 2021-05-21 MED ORDER — LORATADINE 10 MG PO TABS
10.0000 mg | ORAL_TABLET | Freq: Every day | ORAL | Status: DC
  Administered 2021-05-21 – 2021-05-24 (×4): 10 mg via ORAL
  Filled 2021-05-21 (×4): qty 1

## 2021-05-21 MED ORDER — METOCLOPRAMIDE HCL 5 MG/ML IJ SOLN
5.0000 mg | Freq: Three times a day (TID) | INTRAMUSCULAR | Status: DC | PRN
  Administered 2021-05-23: 10 mg via INTRAVENOUS
  Filled 2021-05-21 (×3): qty 2

## 2021-05-21 MED ORDER — BUPIVACAINE LIPOSOME 1.3 % IJ SUSP
INTRAMUSCULAR | Status: DC | PRN
  Administered 2021-05-21: 20 mL

## 2021-05-21 MED ORDER — BUPIVACAINE IN DEXTROSE 0.75-8.25 % IT SOLN
INTRATHECAL | Status: DC | PRN
  Administered 2021-05-21: 1.8 mL via INTRATHECAL

## 2021-05-21 MED ORDER — BUPIVACAINE LIPOSOME 1.3 % IJ SUSP
INTRAMUSCULAR | Status: AC
  Filled 2021-05-21: qty 20

## 2021-05-21 MED ORDER — PANTOPRAZOLE SODIUM 40 MG PO TBEC
80.0000 mg | DELAYED_RELEASE_TABLET | Freq: Every day | ORAL | Status: DC
Start: 2021-05-21 — End: 2021-05-24
  Administered 2021-05-21 – 2021-05-24 (×4): 80 mg via ORAL
  Filled 2021-05-21 (×4): qty 2

## 2021-05-21 MED ORDER — FERROUS SULFATE 325 (65 FE) MG PO TABS
325.0000 mg | ORAL_TABLET | Freq: Three times a day (TID) | ORAL | Status: DC
  Administered 2021-05-21 – 2021-05-24 (×9): 325 mg via ORAL
  Filled 2021-05-21 (×9): qty 1

## 2021-05-21 MED ORDER — OXYCODONE-ACETAMINOPHEN 5-325 MG PO TABS: 1.0000 | ORAL_TABLET | Freq: Four times a day (QID) | ORAL | 0 refills | Status: DC | PRN

## 2021-05-21 MED ORDER — ROPIVACAINE HCL 5 MG/ML IJ SOLN
INTRAMUSCULAR | Status: DC | PRN
  Administered 2021-05-21: 30 mL via PERINEURAL

## 2021-05-21 MED ORDER — METOCLOPRAMIDE HCL 5 MG PO TABS
5.0000 mg | ORAL_TABLET | Freq: Three times a day (TID) | ORAL | Status: DC | PRN
  Administered 2021-05-22 (×2): 5 mg via ORAL
  Filled 2021-05-21 (×2): qty 1

## 2021-05-21 MED ORDER — ACETAMINOPHEN 325 MG PO TABS
325.0000 mg | ORAL_TABLET | Freq: Four times a day (QID) | ORAL | Status: DC | PRN
  Administered 2021-05-22: 650 mg via ORAL
  Filled 2021-05-21: qty 2

## 2021-05-21 MED ORDER — CEFAZOLIN SODIUM-DEXTROSE 2-4 GM/100ML-% IV SOLN
2.0000 g | INTRAVENOUS | Status: AC
  Administered 2021-05-21: 2 g via INTRAVENOUS

## 2021-05-21 MED ORDER — MIDAZOLAM HCL 2 MG/2ML IJ SOLN
INTRAMUSCULAR | Status: AC
  Filled 2021-05-21: qty 2

## 2021-05-21 MED ORDER — ORAL CARE MOUTH RINSE: 15.0000 mL | Freq: Once | OROMUCOSAL | Status: AC

## 2021-05-21 MED ORDER — METHOCARBAMOL 500 MG PO TABS
500.0000 mg | ORAL_TABLET | Freq: Four times a day (QID) | ORAL | Status: DC | PRN
  Administered 2021-05-21 – 2021-05-22 (×4): 500 mg via ORAL
  Filled 2021-05-21 (×3): qty 1

## 2021-05-21 MED ORDER — SODIUM CHLORIDE 0.9 % IV SOLN: INTRAVENOUS | Status: DC

## 2021-05-21 MED ORDER — LACTATED RINGERS IV SOLN: INTRAVENOUS | Status: DC

## 2021-05-21 MED ORDER — TRAMADOL HCL 50 MG PO TABS
50.0000 mg | ORAL_TABLET | Freq: Four times a day (QID) | ORAL | Status: DC
  Administered 2021-05-21 – 2021-05-24 (×11): 50 mg via ORAL
  Filled 2021-05-21 (×12): qty 1

## 2021-05-21 MED ORDER — CHLORHEXIDINE GLUCONATE 0.12 % MT SOLN: 15.0000 mL | Freq: Once | OROMUCOSAL | Status: AC

## 2021-05-21 MED ORDER — ASPIRIN EC 325 MG PO TBEC
325.0000 mg | DELAYED_RELEASE_TABLET | Freq: Every day | ORAL | Status: DC
  Administered 2021-05-24: 325 mg via ORAL
  Filled 2021-05-21 (×3): qty 1

## 2021-05-21 MED ORDER — DOCUSATE SODIUM 100 MG PO CAPS
100.0000 mg | ORAL_CAPSULE | Freq: Two times a day (BID) | ORAL | Status: DC
  Administered 2021-05-21 – 2021-05-24 (×5): 100 mg via ORAL
  Filled 2021-05-21 (×5): qty 1

## 2021-05-21 MED ORDER — FENTANYL CITRATE (PF) 250 MCG/5ML IJ SOLN
INTRAMUSCULAR | Status: DC | PRN
  Administered 2021-05-21: 50 ug via INTRAVENOUS

## 2021-05-21 MED ORDER — PROPOFOL 500 MG/50ML IV EMUL
INTRAVENOUS | Status: DC | PRN
  Administered 2021-05-21: 50 ug/kg/min via INTRAVENOUS

## 2021-05-21 MED ORDER — CALCIUM CARBONATE-VITAMIN D 500-200 MG-UNIT PO TABS
1.0000 | ORAL_TABLET | Freq: Every day | ORAL | Status: DC
  Administered 2021-05-22 – 2021-05-24 (×3): 1 via ORAL
  Filled 2021-05-21 (×3): qty 1

## 2021-05-21 MED ORDER — METOPROLOL TARTRATE 25 MG PO TABS
25.0000 mg | ORAL_TABLET | Freq: Every day | ORAL | Status: DC
  Administered 2021-05-22 – 2021-05-23 (×2): 25 mg via ORAL
  Filled 2021-05-21 (×2): qty 1

## 2021-05-21 MED ORDER — CHLORHEXIDINE GLUCONATE 0.12 % MT SOLN
OROMUCOSAL | Status: AC
  Administered 2021-05-21: 15 mL via OROMUCOSAL
  Filled 2021-05-21: qty 15

## 2021-05-21 MED ORDER — ONDANSETRON HCL 4 MG/2ML IJ SOLN
INTRAMUSCULAR | Status: DC | PRN
  Administered 2021-05-21: 4 mg via INTRAVENOUS

## 2021-05-21 MED ORDER — METOPROLOL TARTRATE 25 MG PO TABS: 25.0000 mg | ORAL_TABLET | Freq: Every day | ORAL | Status: DC

## 2021-05-21 MED ORDER — FENTANYL CITRATE (PF) 100 MCG/2ML IJ SOLN
25.0000 ug | INTRAMUSCULAR | Status: DC | PRN
  Administered 2021-05-21 (×2): 25 ug via INTRAVENOUS

## 2021-05-21 MED ORDER — ASPIRIN 325 MG PO TABS: 325.0000 mg | ORAL_TABLET | Freq: Every day | ORAL | Status: DC

## 2021-05-21 SURGICAL SUPPLY — 64 items
ATTUNE PS FEM RT SZ 5 CEM KNEE (Femur) ×2 IMPLANT
ATTUNE PSRP INSR SZ5 5 KNEE (Insert) ×2 IMPLANT
BAG COUNTER SPONGE SURGICOUNT (BAG) ×2 IMPLANT
BANDAGE ESMARK 6X9 LF (GAUZE/BANDAGES/DRESSINGS) ×1 IMPLANT
BASE TIBIAL ROT PLAT SZ 5 KNEE (Knees) ×1 IMPLANT
BENZOIN TINCTURE PRP APPL 2/3 (GAUZE/BANDAGES/DRESSINGS) ×2 IMPLANT
BLADE SAGITTAL 25.0X1.19X90 (BLADE) ×2 IMPLANT
BLADE SAW SGTL 13X75X1.27 (BLADE) ×2 IMPLANT
BNDG ELASTIC 4X5.8 VLCR STR LF (GAUZE/BANDAGES/DRESSINGS) ×2 IMPLANT
BNDG ELASTIC 6X5.8 VLCR STR LF (GAUZE/BANDAGES/DRESSINGS) ×2 IMPLANT
BNDG ESMARK 6X9 LF (GAUZE/BANDAGES/DRESSINGS) ×2
BOWL SMART MIX CTS (DISPOSABLE) ×2 IMPLANT
CEMENT HV SMART SET (Cement) ×4 IMPLANT
COVER SURGICAL LIGHT HANDLE (MISCELLANEOUS) ×2 IMPLANT
CUFF TOURN SGL QUICK 34 (TOURNIQUET CUFF) ×2
CUFF TRNQT CYL 34X4.125X (TOURNIQUET CUFF) ×1 IMPLANT
DRAPE ORTHO SPLIT 77X108 STRL (DRAPES) ×4
DRAPE SURG ORHT 6 SPLT 77X108 (DRAPES) ×2 IMPLANT
DRAPE U-SHAPE 47X51 STRL (DRAPES) ×2 IMPLANT
DRSG PAD ABDOMINAL 8X10 ST (GAUZE/BANDAGES/DRESSINGS) ×2 IMPLANT
DURAPREP 26ML APPLICATOR (WOUND CARE) ×4 IMPLANT
ELECT REM PT RETURN 9FT ADLT (ELECTROSURGICAL) ×2
ELECTRODE REM PT RTRN 9FT ADLT (ELECTROSURGICAL) ×1 IMPLANT
EVACUATOR 1/8 PVC DRAIN (DRAIN) IMPLANT
FACESHIELD WRAPAROUND (MASK) ×4 IMPLANT
GAUZE SPONGE 4X4 12PLY STRL (GAUZE/BANDAGES/DRESSINGS) ×2 IMPLANT
GAUZE XEROFORM 5X9 LF (GAUZE/BANDAGES/DRESSINGS) IMPLANT
GLOVE SRG 8 PF TXTR STRL LF DI (GLOVE) ×2 IMPLANT
GLOVE SURG ORTHO LTX SZ7.5 (GLOVE) ×4 IMPLANT
GLOVE SURG UNDER POLY LF SZ8 (GLOVE) ×4
GOWN STRL REUS W/ TWL LRG LVL3 (GOWN DISPOSABLE) ×1 IMPLANT
GOWN STRL REUS W/ TWL XL LVL3 (GOWN DISPOSABLE) ×1 IMPLANT
GOWN STRL REUS W/TWL 2XL LVL3 (GOWN DISPOSABLE) ×2 IMPLANT
GOWN STRL REUS W/TWL LRG LVL3 (GOWN DISPOSABLE) ×2
GOWN STRL REUS W/TWL XL LVL3 (GOWN DISPOSABLE) ×2
HANDPIECE INTERPULSE COAX TIP (DISPOSABLE) ×2
IMMOBILIZER KNEE 22 UNIV (SOFTGOODS) ×2 IMPLANT
KIT BASIN OR (CUSTOM PROCEDURE TRAY) ×2 IMPLANT
KIT TURNOVER KIT B (KITS) ×2 IMPLANT
MANIFOLD NEPTUNE II (INSTRUMENTS) ×4 IMPLANT
MARKER SKIN DUAL TIP RULER LAB (MISCELLANEOUS) ×2 IMPLANT
NEEDLE 18GX1X1/2 (RX/OR ONLY) (NEEDLE) ×2 IMPLANT
NEEDLE HYPO 25GX1X1/2 BEV (NEEDLE) ×2 IMPLANT
NS IRRIG 1000ML POUR BTL (IV SOLUTION) ×2 IMPLANT
PACK TOTAL JOINT (CUSTOM PROCEDURE TRAY) ×2 IMPLANT
PAD ARMBOARD 7.5X6 YLW CONV (MISCELLANEOUS) ×2 IMPLANT
PATELLA MEDIAL ATTUN 35MM KNEE (Knees) ×2 IMPLANT
SET HNDPC FAN SPRY TIP SCT (DISPOSABLE) ×1 IMPLANT
STAPLER VISISTAT 35W (STAPLE) IMPLANT
STRIP CLOSURE SKIN 1/2X4 (GAUZE/BANDAGES/DRESSINGS) ×2 IMPLANT
SUCTION FRAZIER HANDLE 10FR (MISCELLANEOUS) ×2
SUCTION TUBE FRAZIER 10FR DISP (MISCELLANEOUS) ×1 IMPLANT
SUT VIC AB 0 CT1 27 (SUTURE) ×2
SUT VIC AB 0 CT1 27XBRD ANBCTR (SUTURE) ×1 IMPLANT
SUT VIC AB 1 CTX 36 (SUTURE) ×4
SUT VIC AB 1 CTX36XBRD ANBCTR (SUTURE) ×2 IMPLANT
SUT VIC AB 2-0 CT1 27 (SUTURE) ×4
SUT VIC AB 2-0 CT1 TAPERPNT 27 (SUTURE) ×2 IMPLANT
SUT VIC AB 3-0 X1 27 (SUTURE) ×2 IMPLANT
SYR 50ML LL SCALE MARK (SYRINGE) ×2 IMPLANT
SYR CONTROL 10ML LL (SYRINGE) ×2 IMPLANT
TIBIAL BASE ROT PLAT SZ 5 KNEE (Knees) ×2 IMPLANT
TOWEL GREEN STERILE (TOWEL DISPOSABLE) ×2 IMPLANT
TOWEL GREEN STERILE FF (TOWEL DISPOSABLE) ×2 IMPLANT

## 2021-05-21 NOTE — Anesthesia Procedure Notes (Signed)
Procedure Name: MAC Date/Time: 05/21/2021 7:49 AM Performed by: Carolan Clines, CRNA Pre-anesthesia Checklist: Patient identified, Emergency Drugs available, Suction available and Patient being monitored Patient Re-evaluated:Patient Re-evaluated prior to induction Oxygen Delivery Method: Simple face mask Dental Injury: Teeth and Oropharynx as per pre-operative assessment

## 2021-05-21 NOTE — Evaluation (Signed)
Physical Therapy Evaluation Patient Details Name: Rebecca Guzman MRN: [id number] DOB: 1934-02-10 Today's Date: 05/21/2021  History of Present Illness  Pt is an 87 y/o female s/p R TKA on 10/14. PMH includes breast cancer s/p lumpectomy and BPPV.  Clinical Impression  Pt s/p surgery above with deficits below. Pt requiring min A to stand and ambulate short distance. Pt began feeling very light headed and RN brought WC. Checked BP and was at 79/38. Performed squat pivot back to bed with mod A +2. Checked BP upon return to supine and BP at 120/69. Anticipate pt will progress well once symptoms improve. Will continue to follow acutely.        Recommendations for follow up therapy are one component of a multi-disciplinary discharge planning process, led by the attending physician.  Recommendations may be updated based on patient status, additional functional criteria and insurance authorization.  Follow Up Recommendations Follow surgeon's recommendation for DC plan and follow-up therapies    Equipment Recommendations  Rolling walker with 5" wheels    Recommendations for Other Services       Precautions / Restrictions Precautions Precautions: Knee Precaution Booklet Issued: No Precaution Comments: Verbally reviewed knee precautions.  watch BP. Restrictions Weight Bearing Restrictions: Yes RLE Weight Bearing: Weight bearing as tolerated      Mobility  Bed Mobility Overal bed mobility: Needs Assistance Bed Mobility: Supine to Sit;Sit to Supine     Supine to sit: Min assist Sit to supine: Max assist   General bed mobility comments: Min A for RLE assist to come to sitting. Required max A to return to supine secondary to dizziness, lightheadedness, orthostatics    Transfers Overall transfer level: Needs assistance Equipment used: Rolling walker (2 wheeled);2 person hand held assist Transfers: Sit to/from [employer] Sit to Stand: Min assist   Squat pivot  transfers: Mod assist;+2 physical assistance     General transfer comment: Min A for lift assist to stand using RW. Cues for hand placement. Required squat pivot transfer for return to bed from [facility] secondary to orthostatic hypotension.  Ambulation/Gait Ambulation/Gait assistance: Min assist Gait Distance (Feet): 3 Feet Assistive device: Rolling walker (2 wheeled) Gait Pattern/deviations: Step-to pattern;Decreased step length - right;Decreased step length - left;Decreased weight shift to right;Antalgic Gait velocity: Decreased   General Gait Details: Slow, antalgic gait. Pt reporting increased lightheadedness and dizziness and RN had to bring WC for pt. BP at 79/38. Returned to bed using squat pivot and returned to supine. BP at 120/69 upon return to supine.  Stairs            Wheelchair Mobility    Modified Rankin (Stroke Patients Only)       Balance Overall balance assessment: Needs assistance Sitting-balance support: No upper extremity supported;Feet supported Sitting balance-Leahy Scale: Fair     Standing balance support: Bilateral upper extremity supported Standing balance-Leahy Scale: Poor Standing balance comment: Reliant on BUE support                             Pertinent Vitals/Pain Pain Assessment: Faces Faces Pain Scale: Hurts even more Pain Location: R knee Pain Descriptors / Indicators: Grimacing;Guarding Pain Intervention(s): Limited activity within patient's tolerance;Monitored during session;Repositioned    Home Living Family/patient expects to be discharged to:: Private residence Living Arrangements: Children Available Help at Discharge: Family;Available 24 hours/day Type of Home: House Home Access: Stairs to enter Entrance Stairs-Rails: None Entrance Stairs-Number of Steps: 3 Home  Layout: Two level;Able to live on main level with bedroom/bathroom;1/2 bath on main level Home Equipment: Walker - 4 wheels;Bedside commode;Cane - single  point      Prior Function Level of Independence: Independent with assistive device(s)         Comments: Was using cane for ambulation     Hand Dominance        Extremity/Trunk Assessment   Upper Extremity Assessment Upper Extremity Assessment: Overall WFL for tasks assessed    Lower Extremity Assessment Lower Extremity Assessment: RLE deficits/detail RLE Deficits / Details: Deficits consistent with post op pain and weakness.    Cervical / Trunk Assessment Cervical / Trunk Assessment: Normal  Communication   Communication: No difficulties  Cognition Arousal/Alertness: Awake/alert Behavior During Therapy: WFL for tasks assessed/performed Overall Cognitive Status: Within Functional Limits for tasks assessed                                        General Comments      Exercises Total Joint Exercises Ankle Circles/Pumps: AROM;Both;5 reps   Assessment/Plan    PT Assessment Patient needs continued PT services  PT Problem List Decreased strength;Decreased range of motion;Decreased activity tolerance;Decreased balance;Decreased mobility;Decreased knowledge of use of DME;Decreased knowledge of precautions;Pain       PT Treatment Interventions DME instruction;Stair training;Gait training;Functional mobility training;Therapeutic activities;Therapeutic exercise;Balance training;Patient/family education    PT Goals (Current goals can be found in the Care Plan section)  Acute Rehab PT Goals Patient Stated Goal: to go home PT Goal Formulation: With patient Time For Goal Achievement: 06/04/21 Potential to Achieve Goals: Good    Frequency 7X/week   Barriers to discharge        Co-evaluation               AM-PAC PT "6 Clicks" Mobility  Outcome Measure Help needed turning from your back to your side while in a flat bed without using bedrails?: A Little Help needed moving from lying on your back to sitting on the side of a flat bed without using  bedrails?: A Little Help needed moving to and from a bed to a chair (including a wheelchair)?: A Little Help needed standing up from a chair using your arms (e.g., wheelchair or bedside chair)?: A Little Help needed to walk in hospital room?: A Little Help needed climbing 3-5 steps with a railing? : A Lot 6 Click Score: 17    End of Session Equipment Utilized During Treatment: Gait belt Activity Tolerance: Treatment limited secondary to medical complications (Comment) (orthostatics) Patient left: in bed;with nursing/sitter in room;with call bell/phone within reach (in bed in PACU) Nurse Communication: Mobility status PT Visit Diagnosis: Other abnormalities of gait and mobility (R26.89);Pain Pain - Right/Left: Right Pain - part of body: Knee    Time: 1505-1530 PT Time Calculation (min) (ACUTE ONLY): 25 min   Charges:   PT Evaluation $PT Eval Low Complexity: 1 Low PT Treatments $Therapeutic Activity: 8-22 mins        Lou Miner, DPT  Acute Rehabilitation Services  Pager: [phone] Office: [phone]   Rudean Hitt 05/21/2021, 4:20 PM

## 2021-05-21 NOTE — Interval H&P Note (Signed)
History and Physical Interval Note:  05/21/2021 7:26 AM  Rebecca Guzman  has presented today for surgery, with the diagnosis of right knee osteoarthritis.  The various methods of treatment have been discussed with the patient and family. After consideration of risks, benefits and other options for treatment, the patient has consented to  Procedure(s): RIGHT TOTAL KNEE ARTHROPLASTY (Right) as a surgical intervention.  The patient's history has been reviewed, patient examined, no change in status, stable for surgery.  I have reviewed the patient's chart and labs.  Questions were answered to the patient's satisfaction.     Marybelle Killings

## 2021-05-21 NOTE — H&P (Signed)
TOTAL KNEE ADMISSION H&P  Patient is being admitted for right total knee arthroplasty.  Subjective:  Chief Complaint:right knee pain.  HPI: Rebecca Guzman, 87 y.o. female, has a history of pain and functional disability in the right knee due to arthritis and has failed non-surgical conservative treatments for greater than 12 weeks to includeNSAID's and/or analgesics, corticosteriod injections, supervised PT with diminished ADL's post treatment, use of assistive devices, and activity modification.  Onset of symptoms was gradual, starting 10 years ago with gradually worsening course since that time. The patient noted no past surgery on the right knee(s).  Patient currently rates pain in the right knee(s) at 10 out of 10 with activity. Patient has night pain, worsening of pain with activity and weight bearing, pain that interferes with activities of daily living, crepitus, and joint swelling.  Patient has evidence of subchondral sclerosis, periarticular osteophytes, and joint space narrowing by imaging studies. . There is no active infection.  Patient Active Problem List   Diagnosis Date Noted   Genetic testing 06/07/2018   History of breast cancer 04/19/2018   Wears hearing aid    Wears glasses    Vaginal vault prolapse    Hiatal hernia    BPPV (benign paroxysmal positional vertigo)    Palpitations 10/31/2016   Radicular pain of right lower back 04/06/2015   Compression fracture of L3 lumbar vertebra 04/06/2015   Senile osteoporosis 04/06/2015   DDD (degenerative disc disease), lumbar 04/06/2015   Other malaise and fatigue 01/17/2014   Hx of adenomatous colonic polyps 07/11/2013   Unilateral primary osteoarthritis, right knee    Hyperlipidemia LDL goal <100    Bladder prolapse, female, acquired    Esophageal reflux 10/12/2011   Past Medical History:  Diagnosis Date   Asthma    BPPV (benign paroxysmal positional vertigo)    Breast cancer [place]    left mastectomy 1992, right  lumpectomy 2019   Cancer ([place])    Compression fracture of L3 lumbar vertebra 04/06/2015   Dysrhythmia 2019   pt states Cardiologist told her it is "pre A-fib"   Family history of breast cancer    Family history of colon cancer    Family history of pancreatic cancer    GERD (gastroesophageal reflux disease)    Hiatal hernia    pt states she doesnt have this   History of adenomatous polyp of colon    tubular adenoma 2005 and  2014 tubular adenoma and  hyperplastic polyp   History of breast cancer per pt no recurrence   dx 1992 --  s/p  left breast mastectomy and chemotherapy   Hyperlipidemia LDL goal <100    Osteoarthritis of right knee    Personal history of chemotherapy 1992   PONV (postoperative nausea and vomiting)    and "gets weak and light-leaded"   Sensation of pressure in bladder area    Vaginal vault prolapse    anterior   Wears glasses    Wears hearing aid    left only    Past Surgical History:  Procedure Laterality Date   BREAST BIOPSY Right 04/19/2018   BREAST LUMPECTOMY Right 05/17/2018   BREAST LUMPECTOMY WITH RADIOACTIVE SEED LOCALIZATION Right 05/17/2018   Procedure: RIGHT BREAST LUMPECTOMY WITH RADIOACTIVE SEED LOCALIZATION;  Surgeon: [name], MD;  Location: [facility];  Service: General;  Laterality: Right;   CATARACT EXTRACTION W/ INTRAOCULAR LENS  IMPLANT, BILATERAL  2014   CHOLECYSTECTOMY  1985   COLONOSCOPY  last one 06-04-2013   CYSTOCELE  REPAIR N/A 02/29/2016   Procedure: ANTERIOR VAULT PROLAPSE REPAIR COLOPLAST [place] FIXATION AUGMENTED AXIS DERMIS REPAIR  ;  Surgeon: Carolan Clines, MD;  Location: [facility];  Service: Urology;  Laterality: N/A;   ESOPHAGOGASTRODUODENOSCOPY  08-31-2009   LAPAROSCOPIC ASSISTED VAGINAL HYSTERECTOMY  01-10-2000   w/ Bilateral Salpingoophorectomy /  Anterior & Posterior repair/  Pubovaginal Sling   MASTECTOMY Left 1992    Current Facility-Administered Medications  Medication Dose Route  Frequency Provider Last Rate Last Admin   ceFAZolin (ANCEF) 2-4 GM/100ML-% IVPB            ceFAZolin (ANCEF) IVPB 2g/100 mL premix  2 g Intravenous On Call to OR Lanae Crumbly, PA-C       lactated ringers infusion   Intravenous Continuous Effie Berkshire, MD       Allergies  Allergen Reactions   Codeine Nausea And Vomiting   Morphine And Related Other (See Comments)    Nausea, and drop in blood pressure    Social History   Tobacco Use   Smoking status: Former    Years: 13.00    Types: Cigarettes    Quit date: 10/12/1983    Years since quitting: 37.6   Smokeless tobacco: Never  Substance Use Topics   Alcohol use: No    Family History  Problem Relation Age of Onset   Heart attack Mother    Colon cancer Mother 74   Hypertension Mother    Heart disease Father    Breast cancer Sister        pat half sister dx in her 23s   Pancreatic cancer Other 81     Review of Systems  Constitutional:  Positive for activity change.  HENT: Negative.    Respiratory: Negative.    Cardiovascular: Negative.   Gastrointestinal: Negative.   Genitourinary: Negative.   Musculoskeletal:  Positive for gait problem and joint swelling.  Skin: Negative.    Objective:  Physical Exam HENT:     Head: Normocephalic and atraumatic.     Nose: Nose normal.  Cardiovascular:     Rate and Rhythm: Regular rhythm.  Pulmonary:     Effort: Pulmonary effort is normal. No respiratory distress.     Breath sounds: No wheezing.  Musculoskeletal:        General: Tenderness present.  Neurological:     General: No focal deficit present.     Mental Status: She is alert.    Vital signs in last 24 hours: Temp:  [97.8 F (36.6 C)] 97.8 F (36.6 C) (10/14 0612) Pulse Rate:  [86] 86 (10/14 0612) Resp:  [18] 18 (10/14 0612) BP: (128)/(70) 128/70 (10/14 0614) SpO2:  [98 %] 98 % (10/14 0612) Weight:  [75.8 kg] 75.8 kg (10/14 0612)  Labs:   Estimated body mass index is 27.79 kg/m as calculated from the  following:   Height as of this encounter: 5\' 5"  (1.651 m).   Weight as of this encounter: 75.8 kg.   Imaging Review Plain radiographs demonstrate moderate degenerative joint disease of the right knee(s). The overall alignment ismild varus. The bone quality appears to be good for age and reported activity level.      Assessment/Plan:  End stage arthritis, right knee   The patient history, physical examination, clinical judgment of the provider and imaging studies are consistent with end stage degenerative joint disease of the right knee(s) and total knee arthroplasty is deemed medically necessary. The treatment options including medical management, injection therapy arthroscopy and  arthroplasty were discussed at length. The risks and benefits of total knee arthroplasty were presented and reviewed. The risks due to aseptic loosening, infection, stiffness, patella tracking problems, thromboembolic complications and other imponderables were discussed. The patient acknowledged the explanation, agreed to proceed with the plan and consent was signed. Patient is being admitted for inpatient treatment for surgery, pain control, PT, OT, prophylactic antibiotics, VTE prophylaxis, progressive ambulation and ADL's and discharge planning. The patient is planning to be discharged home with home health services     Patient's anticipated LOS is less than 2 midnights, meeting these requirements: - Younger than 24 - Lives within 1 hour of care - Has a competent adult at home to recover with post-op recover - NO history of  - Chronic pain requiring opiods  - Diabetes  - Coronary Artery Disease  - Heart failure  - Heart attack  - Stroke  - DVT/VTE  - Cardiac arrhythmia  - Respiratory Failure/COPD  - Renal failure  - Anemia  - Advanced Liver disease

## 2021-05-21 NOTE — Anesthesia Procedure Notes (Addendum)
Spinal  Start time: 05/21/2021 7:59 AM End time: 05/21/2021 8:02 AM Reason for block: surgical anesthesia Staffing Performed: anesthesiologist  Anesthesiologist: Effie Berkshire, MD Preanesthetic Checklist Completed: patient identified, IV checked, site marked, risks and benefits discussed, surgical consent, monitors and equipment checked, pre-op evaluation and timeout performed Spinal Block Patient position: sitting Prep: DuraPrep and site prepped and draped Location: L3-4 Injection technique: single-shot Needle Needle type: Pencan  Needle gauge: 24 G Needle length: 10 cm Needle insertion depth: 10 cm Additional Notes Patient tolerated well. No immediate complications.  Pt has significant scoliosis.

## 2021-05-21 NOTE — Discharge Instructions (Signed)

## 2021-05-21 NOTE — Anesthesia Postprocedure Evaluation (Signed)
Anesthesia Post Note  Patient: Rebecca Guzman  Procedure(s) Performed: RIGHT TOTAL KNEE ARTHROPLASTY (Right: Knee)     Patient location during evaluation: PACU Anesthesia Type: Spinal Level of consciousness: awake and alert Pain management: pain level controlled Vital Signs Assessment: post-procedure vital signs reviewed and stable Respiratory status: spontaneous breathing, nonlabored ventilation, respiratory function stable and patient connected to nasal cannula oxygen Cardiovascular status: blood pressure returned to baseline and stable Postop Assessment: no apparent nausea or vomiting Anesthetic complications: no   No notable events documented.  Last Vitals:  Vitals:   05/21/21 1347 05/21/21 1417  BP: 122/68 120/72  Pulse: 73 84  Resp: 18 18  Temp: 36.6 C   SpO2: 98% 98%    Last Pain:  Vitals:   05/21/21 1417  TempSrc:   PainSc: 0-No pain                 Effie Berkshire

## 2021-05-21 NOTE — Anesthesia Procedure Notes (Signed)
Anesthesia Regional Block: Adductor canal block   Pre-Anesthetic Checklist: , timeout performed,  Correct Patient, Correct Site, Correct Laterality,  Correct Procedure, Correct Position, site marked,  Risks and benefits discussed,  Surgical consent,  Pre-op evaluation,  At surgeon's request and post-op pain management  Laterality: Right  Prep: chloraprep       Needles:  Injection technique: Single-shot  Needle Type: Echogenic Stimulator Needle     Needle Length: 9cm  Needle Gauge: 21     Additional Needles:   Procedures:,,,, ultrasound used (permanent image in chart),,    Narrative:  Start time: 05/21/2021 7:30 AM End time: 05/21/2021 7:35 AM Injection made incrementally with aspirations every 5 mL.  Performed by: Personally  Anesthesiologist: Effie Berkshire, MD  Additional Notes: Patient tolerated the procedure well. Local anesthetic introduced in an incremental fashion under minimal resistance after negative aspirations. No paresthesias were elicited. After completion of the procedure, no acute issues were identified and patient continued to be monitored by RN.

## 2021-05-21 NOTE — Transfer of Care (Signed)
Immediate Anesthesia Transfer of Care Note  Patient: Rebecca Guzman  Procedure(s) Performed: RIGHT TOTAL KNEE ARTHROPLASTY (Right: Knee)  Patient Location: PACU  Anesthesia Type:Spinal and MAC combined with regional for post-op pain  Level of Consciousness: awake, alert  and oriented  Airway & Oxygen Therapy: Patient Spontanous Breathing  Post-op Assessment: Report given to RN and Post -op Vital signs reviewed and stable  Post vital signs: Reviewed and stable  Last Vitals:  Vitals Value Taken Time  BP 111/57 05/21/21 1002  Temp    Pulse 74 05/21/21 1003  Resp 20 05/21/21 1003  SpO2 99 % 05/21/21 1003  Vitals shown include unvalidated device data.  Last Pain:  Vitals:   05/21/21 0626  TempSrc:   PainSc: 0-No pain         Complications: No notable events documented.

## 2021-05-21 NOTE — Op Note (Signed)
Preop diagnosis: Right knee primary osteoarthritis  Postop diagnosis same  Procedure: Right total knee arthroplasty  Surgeon: Rodell Perna MD  Anesthesia: Preoperative abductor block plus spinal anesthesia plus Exparel and Marcaine 20+28 was 40.  Tourniquet: 300 x 50 minutes.  Implants:Implants  CEMENT HV SMART SET - [id number]  Inventory Item: CEMENT HV SMART SET Serial no.:  Model/Cat no.: [id number]  Implant name: CEMENT HV SMART SET - [id number] Laterality: Right Area: Knee  Manufacturer: [employer] Date of Manufacture:    Action: Implanted Number Used: 2   Device Identifier:  Device Identifier Type:     ATTUNE PSRP INSR SZ5 5MM KNEE - [id number]  Inventory Item: ATTUNE PSRP INSR SZ5 5MM KNEE Serial no.:  Model/Cat no.: [id number]  Implant name: ATTUNE PSRP INSR SZ5 5MM KNEE - [id number] Laterality: Right Area: Knee  Manufacturer: [place] Date of Manufacture:    Action: Implanted Number Used: 1   Device Identifier:  Device Identifier Type:     PATELLA MEDIAL ATTUN 35MM KNEE - [id number]  Inventory Item: PATELLA MEDIAL ATTUN 35MM KNEE Serial no.:  Model/Cat no.: [id number]  Implant name: PATELLA MEDIAL ATTUN 35MM KNEE - [id number] Laterality: Right Area: Knee  Manufacturer: [employer] Date of Manufacture:    Action: Implanted Number Used: 1   Device Identifier:  Device Identifier Type:     TIBIAL BASE ROT PLAT SZ 5 KNEE - [id number]  Inventory Item: TIBIAL BASE ROT PLAT SZ 5 KNEE Serial no.:  Model/Cat no.: [id number]  Implant name: TIBIAL BASE ROT PLAT SZ 5 KNEE - [id number] Laterality: Right Area: Knee  Manufacturer: [employer] Date of Manufacture:    Action: Implanted Number Used: 1   Device Identifier:  Device Identifier Type:     ATTUNE PS FEM RT SZ 5 CEM KNEE - [id number]  Inventory Item: ATTUNE PS FEM RT SZ 5 CEM KNEE Serial no.:  Model/Cat no.: [id number]  Implant name: ATTUNE PS FEM RT SZ 5 CEM KNEE - [id number] Laterality: Right Area: Knee   Manufacturer: [employer] Date of Manufacture:    Action: Implanted Number Used: 1   Device Identifier:  Device Identifier Type:     Procedure: After induction of spinal anesthesia with abductor block proximal thigh tourniquet lateral post heel bump standard prepping and draping double sheets for knee arthroplasty were added sterile skin marker Betadine Steri-Drape filling scan with impervious stockinette and Coban.  Timeout procedure completed Ancef prophylaxis IV TXA.  Leg was wrapped in Esmarch tourniquet inflated midline incision was made patella was everted with medial parapatellar incision 10 mm resected.  Sizing 4 #5 on the femur distal cut was made proximal cut made on the tibia resecting 9 mm and 5 mm spacer block gave full extension.  Meniscal remnants were resected there was tricompartmental degenerative arthritis present with eburnated bone in the medial compartment.  Chamfer cuts were made box cuts made keel preparation.  Some spurs removed off the posterior medial femoral condyle none were needed on the lateral and a three-quarter curved osteotome was used with bone hook.  All ACL PCL was resected pulse lavage back to mixing of the cement tibia followed by femur placement of the permanent 5 mm rotating platform Depuy Attune endoprosthesis with 35 mm 3 peg patella.  Femoral component had legs.  There is good position alignment good fit of the prosthesis.  All excessive cement had been removed.  Exparel Marcaine was injected while cement was setting up.  Once cement was hardened 15 minutes tourniquet deflated hemostasis  obtained standard layered closure #1 Vicryl in the quad tendon medial retinaculum 2-0 Vicryl subcutaneous tissue subicular 87-year-old Vicryl closure tincture benzoin Steri-Strips 4 x 4's ABD web roll Ace wrap and knee immobilizer.

## 2021-05-22 LAB — CBC
HCT: 32.6 % — ABNORMAL LOW (ref 36.0–46.0)
Hemoglobin: 11.1 g/dL — ABNORMAL LOW (ref 12.0–15.0)
MCH: 30.9 pg (ref 26.0–34.0)
MCHC: 34 g/dL (ref 30.0–36.0)
MCV: 90.8 fL (ref 80.0–100.0)
Platelets: 197 10*3/uL (ref 150–400)
RBC: 3.59 MIL/uL — ABNORMAL LOW (ref 3.87–5.11)
RDW: 13.4 % (ref 11.5–15.5)
WBC: 12 10*3/uL — ABNORMAL HIGH (ref 4.0–10.5)
nRBC: 0 % (ref 0.0–0.2)

## 2021-05-22 NOTE — Progress Notes (Signed)
Physical Therapy Treatment Patient Details Name: Rebecca Guzman MRN: [id number] DOB: 05-11-1934 Today's Date: 05/22/2021   History of Present Illness Pt is an 87 y/o female s/p R TKA on 10/14. post-op orthostasis PMH includes breast cancer s/p lumpectomy and BPPV.    PT Comments    Patient reports she has issues with her BP dropping whenever she has anesthesia.  Today her BP: Supine                       123/59 HR 82 Seated                       118/60 HR 90 Seated after standing 102/52 HR 76  Was able to walk 4 ft to chair today, however was feeling dizzy and BP taken once seated (see above). Anticipate slow progress towards discharge home as pt has 3 steps to enter (although may be able to walk through grass to back door where she has one step to enter).    Recommendations for follow up therapy are one component of a multi-disciplinary discharge planning process, led by the attending physician.  Recommendations may be updated based on patient status, additional functional criteria and insurance authorization.  Follow Up Recommendations  Follow surgeon's recommendation for DC plan and follow-up therapies     Equipment Recommendations  Rolling walker with 5" wheels    Recommendations for Other Services       Precautions / Restrictions Precautions Precautions: Knee Precaution Booklet Issued: No Precaution Comments: Verbally reviewed knee precautions.  watch BP. Restrictions RLE Weight Bearing: Weight bearing as tolerated     Mobility  Bed Mobility Overal bed mobility: Needs Assistance Bed Mobility: Supine to Sit;Sit to Supine     Supine to sit: Min assist     General bed mobility comments: Min A for RLE assist to come to sitting and for scooting pelvis to EOB    Transfers Overall transfer level: Needs assistance Equipment used: Rolling walker (2 wheeled);2 person hand held assist Transfers: Sit to/from Stand Sit to Stand: Min assist         General transfer  comment: Min A for lift assist to stand using RW. Cues for hand placement. .  Ambulation/Gait Ambulation/Gait assistance: Min assist Gait Distance (Feet): 4 Feet Assistive device: Rolling walker (2 wheeled) Gait Pattern/deviations: Step-to pattern;Decreased step length - right;Decreased step length - left;Decreased weight shift to right;Antalgic Gait velocity: Decreased   General Gait Details: Slow, antalgic gait. Mild incr dizzinesss   Stairs             Wheelchair Mobility    Modified Rankin (Stroke Patients Only)       Balance Overall balance assessment: Needs assistance Sitting-balance support: No upper extremity supported;Feet supported Sitting balance-Leahy Scale: Fair     Standing balance support: Bilateral upper extremity supported Standing balance-Leahy Scale: Poor Standing balance comment: Reliant on BUE support                            Cognition Arousal/Alertness: Awake/alert Behavior During Therapy: WFL for tasks assessed/performed Overall Cognitive Status: Within Functional Limits for tasks assessed                                        Exercises Total Joint Exercises Ankle Circles/Pumps: AROM;Both;10 reps Quad Sets: AROM;10  reps Heel Slides: 10 reps;AAROM Straight Leg Raises: 10 reps;AAROM Knee Flexion: AROM Goniometric ROM: ~45    General Comments        Pertinent Vitals/Pain Pain Assessment: 0-10 Faces Pain Scale: Hurts even more Pain Location: R knee Pain Descriptors / Indicators: Grimacing;Guarding Pain Intervention(s): Limited activity within patient's tolerance;Monitored during session;Premedicated before session    Home Living                      Prior Function            PT Goals (current goals can now be found in the care plan section) Acute Rehab PT Goals Patient Stated Goal: to go home Time For Goal Achievement: 06/04/21 Potential to Achieve Goals: Good Progress towards PT  goals: Progressing toward goals    Frequency    7X/week      PT Plan Current plan remains appropriate    Co-evaluation              AM-PAC PT "6 Clicks" Mobility   Outcome Measure  Help needed turning from your back to your side while in a flat bed without using bedrails?: A Little Help needed moving from lying on your back to sitting on the side of a flat bed without using bedrails?: A Little Help needed moving to and from a bed to a chair (including a wheelchair)?: A Little Help needed standing up from a chair using your arms (e.g., wheelchair or bedside chair)?: A Little Help needed to walk in hospital room?: A Little Help needed climbing 3-5 steps with a railing? : A Lot 6 Click Score: 17    End of Session Equipment Utilized During Treatment: Gait belt Activity Tolerance: Treatment limited secondary to medical complications (Comment) (continued orthostasis) Patient left: with call bell/phone within reach;in chair;with chair alarm set (in bed in PACU) Nurse Communication: Mobility status;Other (comment) (rec BSC instead of walking to bathroom) PT Visit Diagnosis: Other abnormalities of gait and mobility (R26.89);Pain Pain - Right/Left: Right Pain - part of body: Knee     Time: 1049-1140 PT Time Calculation (min) (ACUTE ONLY): 51 min  Charges:  $Gait Training: 8-22 mins $Therapeutic Exercise: 23-37 mins                      Arby Barrette, PT Acute Rehabilitation Services  Pager [phone] Office [phone]    Rebecca Guzman 05/22/2021, 11:50 AM

## 2021-05-22 NOTE — Plan of Care (Signed)

## 2021-05-22 NOTE — Progress Notes (Signed)
   Subjective:  No acute events overnight.  Resting comfortably.  Pain well controlled.  Denies fevers, chills, nausea, vomiting, chest pain, or shortness of breath.   Objective:   VITALS:   Vitals:   05/21/21 2347 05/22/21 0409 05/22/21 0410 05/22/21 0844  BP: 127/62  (!) 115/59 126/64  Pulse: 98  81 93  Resp:    18  Temp: 98.6 F (37 C) 98.4 F (36.9 C) 98.4 F (36.9 C) 98.6 F (37 C)  TempSrc: Oral Oral Oral Oral  SpO2: 98%  90% 92%  Weight:      Height:        Gen: Resting comfortably, NAD Pulm: Normal WOB on RA CV: BLE warm and well perfused, nl rate RLE: Dressing clean and dry, ADF/APF/EHL/FHL 5/5, SILT su/sa/sp/dp/t distributions    Lab Results  Component Value Date   WBC 12.0 (H) 05/22/2021   HGB 11.1 (L) 05/22/2021   HCT 32.6 (L) 05/22/2021   MCV 90.8 05/22/2021   PLT 197 05/22/2021     Assessment/Plan:  87 yo F 1 Day Post-Op s/p R TKA.  Doing well postoperatively.     - Expected postop acute blood loss anemia, Hgb 11.1 from 13.2, will monitor for symptoms - Continue PT, will work again this afternoon - Dressing changed - Likely DC this afternoon after clears PT   Rebecca Mirabal Colvin Guzman 05/22/2021, 11:26 AM [phone]

## 2021-05-23 LAB — CBC
HCT: 31 % — ABNORMAL LOW (ref 36.0–46.0)
Hemoglobin: 10.6 g/dL — ABNORMAL LOW (ref 12.0–15.0)
MCH: 31.2 pg (ref 26.0–34.0)
MCHC: 34.2 g/dL (ref 30.0–36.0)
MCV: 91.2 fL (ref 80.0–100.0)
Platelets: 180 10*3/uL (ref 150–400)
RBC: 3.4 MIL/uL — ABNORMAL LOW (ref 3.87–5.11)
RDW: 13.9 % (ref 11.5–15.5)
WBC: 11.5 10*3/uL — ABNORMAL HIGH (ref 4.0–10.5)
nRBC: 0 % (ref 0.0–0.2)

## 2021-05-23 MED ORDER — CHLORHEXIDINE GLUCONATE CLOTH 2 % EX PADS
6.0000 | MEDICATED_PAD | Freq: Every day | CUTANEOUS | Status: DC
  Administered 2021-05-24: 6 via TOPICAL

## 2021-05-23 NOTE — Progress Notes (Addendum)
Physical Therapy Treatment Patient Details Name: Rebecca Guzman MRN: [id number] DOB: 11/22/1933 Today's Date: 05/23/2021   History of Present Illness Pt is an 87 y/o female s/p R TKA on 10/14. post-op orthostasis PMH includes breast cancer s/p lumpectomy and BPPV.    PT Comments    Continuing work on functional mobility and activity tolerance;  Session focused on further assessment of serial BPs and activity tolerance, followed by focus on gait training; BPs checked out well this afternoon, and pt was able to walk in her room; Nice, stable knee in stance; on track for dc tomorrow; discussed car transfers with her son, as well as options for getting up the steps to enter her home;   Her son, Charlotte Crumb, indicated he can be here at the hospital to pick her up at 2pm-ish     05/23/21 1630  Orthostatic Lying   BP- Lying 133/66 (MAP 86)  Pulse- Lying 95  Orthostatic Sitting  BP- Sitting 142/72 (MAP 91)  Pulse- Sitting 96  Orthostatic Standing at 0 minutes  BP- Standing at 0 minutes 129/71 (MAP 90)  Pulse- Standing at 0 minutes 103  Orthostatic Standing at 3 minutes  BP- Standing at 3 minutes 119/65 (MAP 81)  Pulse- Standing at 3 minutes 102    Recommendations for follow up therapy are one component of a multi-disciplinary discharge planning process, led by the attending physician.  Recommendations may be updated based on patient status, additional functional criteria and insurance authorization.  Follow Up Recommendations  Follow surgeon's recommendation for DC plan and follow-up therapies;Other (comment) (Pt tells me the plan is for HHPT follow up, and has questions re: if/how HHPT is set up)     Equipment Recommendations  Rolling walker with 5" wheels;3in1 (PT)    Recommendations for Other Services       Precautions / Restrictions Precautions Precautions: Knee Precaution Comments: Verbally reviewed knee precautions. Restrictions RLE Weight Bearing: Weight bearing as  tolerated     Mobility  Bed Mobility Overal bed mobility: Needs Assistance Bed Mobility: Supine to Sit;Sit to Supine     Supine to sit: Min guard Sit to supine: Min assist   General bed mobility comments: Increased time to get up and OOB, but no physical assist needed; min assist to help RLE back into bed after walk    Transfers Overall transfer level: Needs assistance Equipment used: Rolling walker (2 wheeled);2 person hand held assist Transfers: Sit to/from Stand Sit to Stand: Min guard (with physical contact)         General transfer comment: Minguard for safety; cues for hand placement, and to self-monitor for activity tolerance  Ambulation/Gait Ambulation/Gait assistance: Min assist Gait Distance (Feet): 28 Feet Assistive device: Rolling walker (2 wheeled) Gait Pattern/deviations: Step-to pattern     General Gait Details: Cues for sequence and to self-monitor for activity tolerance; Nice, stable knee in stance   Stairs         General stair comments: Demonstrated backwards technique and how to properly assist with her son, Charlotte Crumb; He provided safe and correct assist   Wheelchair Mobility    Modified Rankin (Stroke Patients Only)       Balance     Sitting balance-Leahy Scale: Good       Standing balance-Leahy Scale: Fair                              Cognition Arousal/Alertness: Awake/alert Behavior During Therapy: WFL for  tasks assessed/performed Overall Cognitive Status: Within Functional Limits for tasks assessed                                        Exercises      General Comments        Pertinent Vitals/Pain Pain Assessment: 0-10 Pain Score: 5  Pain Location: R knee Pain Descriptors / Indicators: Grimacing;Guarding Pain Intervention(s): Monitored during session    Home Living                      Prior Function            PT Goals (current goals can now be found in the care plan  section) Acute Rehab PT Goals Patient Stated Goal: to feel confident going home PT Goal Formulation: With patient Time For Goal Achievement: 06/04/21 Potential to Achieve Goals: Good Progress towards PT goals: Progressing toward goals    Frequency    7X/week      PT Plan Current plan remains appropriate    Co-evaluation              AM-PAC PT "6 Clicks" Mobility   Outcome Measure  Help needed turning from your back to your side while in a flat bed without using bedrails?: None Help needed moving from lying on your back to sitting on the side of a flat bed without using bedrails?: A Little Help needed moving to and from a bed to a chair (including a wheelchair)?: A Little Help needed standing up from a chair using your arms (e.g., wheelchair or bedside chair)?: A Little Help needed to walk in hospital room?: A Little Help needed climbing 3-5 steps with a railing? : A Little 6 Click Score: 19    End of Session Equipment Utilized During Treatment: Gait belt Activity Tolerance: Patient tolerated treatment well Patient left: in bed;with call bell/phone within reach;with family/visitor present Nurse Communication: Mobility status (MUCH improved BPs with upright activity) PT Visit Diagnosis: Other abnormalities of gait and mobility (R26.89);Pain Pain - Right/Left: Right Pain - part of body: Knee     Time: 1626-1700 PT Time Calculation (min) (ACUTE ONLY): 34 min  Charges:  $Gait Training: 23-37 mins                     Roney Marion, [place] Pager [phone] Office [place] 05/23/2021, 6:18 PM

## 2021-05-23 NOTE — Progress Notes (Signed)
Physical Therapy Note  (Full session note to follow)  Obtained Orthostatic vitals, including serial BPs once OOB in recliner:    05/23/21 0915 05/23/21 0918 05/23/21 0920  Vital Signs  Patient Position (if appropriate) Orthostatic Vitals  --   --   Orthostatic Lying   BP- Lying 129/67 (MAP85)  --   --   Pulse- Lying 98  --   --   Orthostatic Sitting  BP- Sitting 101/55 (MAP 70) (!) 84/53 (MAP 64) (!) 83/45 (MAP 59)  Pulse- Sitting 86 ("woozy") 93 (Seated upright in recliner; feet up) 78 (seated upright in recliner, feet up)  Orthostatic Standing at 0 minutes  BP- Standing at 0 minutes (!) 69/45 (MAP 53)  --   --   Pulse- Standing at 0 minutes 90 ("woozy, lightheaded"; vision darkening)  --   --     05/23/21 0923 05/23/21 0935  Vital Signs  Patient Position (if appropriate)  --   --   Orthostatic Lying   BP- Lying  --   --   Pulse- Lying  --   --   Orthostatic Sitting  BP- Sitting 109/62 (MAP76) 122/69 (MAP 85)  Pulse- Sitting 81 (Reclined, near supine, feet up) 87 (Reclined, near supine, feet up)  Orthostatic Standing at 0 minutes  BP- Standing at 0 minutes  --   --   Pulse- Standing at 0 minutes  --   --     Will need to stabilize her postural hypotension before considering dc home;   Will return for PM session;   Roney Marion, [place] Pager [phone] Office [phone]

## 2021-05-23 NOTE — Progress Notes (Signed)
   Subjective:  No acute events overnight.  Resting comfortably this AM.  Pain well controlled.   Objective:   VITALS:   Vitals:   05/22/21 1637 05/22/21 2002 05/23/21 0420 05/23/21 0858  BP: (!) 164/76 133/66 125/63 (!) 123/58  Pulse: 98 95 93 (!) 101  Resp: 18 20 16 18   Temp: 97.9 F (36.6 C) 98.4 F (36.9 C) 98.2 F (36.8 C) 98.9 F (37.2 C)  TempSrc: Oral Oral Oral Oral  SpO2: 95% 96% 92% 90%  Weight:      Height:        Gen: Resting comfortably in bedside chair Pulm: Normal WOB on RA CV: BLE warm and well perfused RLE: Dressing clean and dry, ADF/APF/EHL/FHL 5/5, SILT throughout, foot warm and well perfused    Lab Results  Component Value Date   WBC 11.5 (H) 05/23/2021   HGB 10.6 (L) 05/23/2021   HCT 31.0 (L) 05/23/2021   MCV 91.2 05/23/2021   PLT 180 05/23/2021     Assessment/Plan:  2 Days Post-Op s/p R TKA  - Orthostatic hypotension today again w/ PT - Continue with PT this afternoon - Potential DC this afternoon or tomorrow   Natausha Jungwirth Ricardo Schubach 05/23/2021, 11:29 AM [phone]

## 2021-05-23 NOTE — Progress Notes (Signed)
Physical Therapy Treatment Patient Details Name: Rebecca Guzman MRN: [id number] DOB: 1933/10/19 Today's Date: 05/23/2021   History of Present Illness Pt is an 87 y/o female s/p R TKA on 10/14. post-op orthostasis PMH includes breast cancer s/p lumpectomy and BPPV.    PT Comments    Continuing work on functional mobility and activity tolerance;  R knee is still quite painful, but did not note any buckling with the standing and transfer activity she participated in (even if brief because of BP drop with upright standing activity);   My biggest concern at this point, and the holdup to safe dc home is her BP drops with upright activity as documented in previous PT note; Robin, RN, and Dr. Tempie Donning aware  Recommendations for follow up therapy are one component of a multi-disciplinary discharge planning process, led by the attending physician.  Recommendations may be updated based on patient status, additional functional criteria and insurance authorization.  Follow Up Recommendations  Follow surgeon's recommendation for DC plan and follow-up therapies     Equipment Recommendations  Rolling walker with 5" wheels    Recommendations for Other Services       Precautions / Restrictions Precautions Precautions: Knee Precaution Comments: Verbally reviewed knee precautions.  watch BP. Restrictions RLE Weight Bearing: Weight bearing as tolerated     Mobility  Bed Mobility Overal bed mobility: Needs Assistance Bed Mobility: Supine to Sit     Supine to sit: Min assist     General bed mobility comments: Min A for RLE assist to come to sitting and for scooting pelvis to EOB    Transfers Overall transfer level: Needs assistance Equipment used: Rolling walker (2 wheeled);2 person hand held assist Transfers: Sit to/from Stand Sit to Stand: Min assist         General transfer comment: Min A for lift assist to stand using RW. Cues for hand placement.  .  Ambulation/Gait Ambulation/Gait assistance: Mod assist Gait Distance (Feet):  (pivotal steps bed to chair) Assistive device: Rolling walker (2 wheeled)       General Gait Details: Small steps to turn and stand in front of recliner; reported wooziness and lightheadedness, and sat down to recliner as soon as we obtained the BP (see vitals flowsheets or other PT note from today for BP values)   Stairs             Wheelchair Mobility    Modified Rankin (Stroke Patients Only)       Balance     Sitting balance-Leahy Scale: Fair       Standing balance-Leahy Scale: Poor Standing balance comment: Reliant on BUE support                            Cognition Arousal/Alertness: Awake/alert Behavior During Therapy: WFL for tasks assessed/performed Overall Cognitive Status: Within Functional Limits for tasks assessed                                        Exercises Total Joint Exercises Quad Sets: AROM;10 reps Heel Slides: 5 reps;AAROM Goniometric ROM: Approx 8-55 degrees    General Comments        Pertinent Vitals/Pain Pain Assessment: 0-10 Pain Score: 6  Pain Location: R knee Pain Descriptors / Indicators: Grimacing;Guarding Pain Intervention(s): Limited activity within patient's tolerance    Home Living  Prior Function            PT Goals (current goals can now be found in the care plan section) Acute Rehab PT Goals Patient Stated Goal: to feel confident going home PT Goal Formulation: With patient Time For Goal Achievement: 06/04/21 Potential to Achieve Goals: Good Progress towards PT goals: Not progressing toward goals - comment (Limited activity tolerance due to orhtostatic hypotension)    Frequency    7X/week      PT Plan Current plan remains appropriate    Co-evaluation              AM-PAC PT "6 Clicks" Mobility   Outcome Measure  Help needed turning from your back to  your side while in a flat bed without using bedrails?: A Little Help needed moving from lying on your back to sitting on the side of a flat bed without using bedrails?: A Little Help needed moving to and from a bed to a chair (including a wheelchair)?: A Little Help needed standing up from a chair using your arms (e.g., wheelchair or bedside chair)?: A Little Help needed to walk in hospital room?: A Little Help needed climbing 3-5 steps with a railing? : A Lot 6 Click Score: 17    End of Session Equipment Utilized During Treatment: Gait belt Activity Tolerance: Treatment limited secondary to medical complications (Comment) (continued orthostasis) Patient left: in chair;with call bell/phone within reach;with chair alarm set Nurse Communication: Mobility status;Other (comment) (Orthostatic BP values) PT Visit Diagnosis: Other abnormalities of gait and mobility (R26.89);Pain Pain - Right/Left: Right Pain - part of body: Knee     Time: 8341-9622 PT Time Calculation (min) (ACUTE ONLY): 49 min  Charges:  $Therapeutic Exercise: 8-22 mins $Therapeutic Activity: 23-37 mins                     Roney Marion, PT  Acute Rehabilitation Services Pager [phone] Office [phone]    [name] 05/23/2021, 1:23 PM

## 2021-05-24 ENCOUNTER — Encounter (HOSPITAL_COMMUNITY): Payer: Self-pay | Admitting: Orthopaedic Surgery

## 2021-05-24 DIAGNOSIS — I4891 Unspecified atrial fibrillation: Secondary | ICD-10-CM | POA: Diagnosis present

## 2021-05-24 DIAGNOSIS — I951 Orthostatic hypotension: Secondary | ICD-10-CM | POA: Diagnosis not present

## 2021-05-24 DIAGNOSIS — M1711 Unilateral primary osteoarthritis, right knee: Secondary | ICD-10-CM | POA: Diagnosis present

## 2021-05-24 DIAGNOSIS — K219 Gastro-esophageal reflux disease without esophagitis: Secondary | ICD-10-CM | POA: Diagnosis present

## 2021-05-24 DIAGNOSIS — Z800 Family history of malignant neoplasm of digestive organs: Secondary | ICD-10-CM | POA: Diagnosis not present

## 2021-05-24 DIAGNOSIS — Z885 Allergy status to narcotic agent status: Secondary | ICD-10-CM | POA: Diagnosis not present

## 2021-05-24 DIAGNOSIS — D62 Acute posthemorrhagic anemia: Secondary | ICD-10-CM | POA: Diagnosis not present

## 2021-05-24 DIAGNOSIS — E785 Hyperlipidemia, unspecified: Secondary | ICD-10-CM | POA: Diagnosis present

## 2021-05-24 DIAGNOSIS — M810 Age-related osteoporosis without current pathological fracture: Secondary | ICD-10-CM | POA: Diagnosis present

## 2021-05-24 DIAGNOSIS — Z974 Presence of external hearing-aid: Secondary | ICD-10-CM | POA: Diagnosis not present

## 2021-05-24 DIAGNOSIS — H8110 Benign paroxysmal vertigo, unspecified ear: Secondary | ICD-10-CM | POA: Diagnosis present

## 2021-05-24 DIAGNOSIS — Z9221 Personal history of antineoplastic chemotherapy: Secondary | ICD-10-CM | POA: Diagnosis not present

## 2021-05-24 DIAGNOSIS — Z8249 Family history of ischemic heart disease and other diseases of the circulatory system: Secondary | ICD-10-CM | POA: Diagnosis not present

## 2021-05-24 DIAGNOSIS — Z9012 Acquired absence of left breast and nipple: Secondary | ICD-10-CM | POA: Diagnosis not present

## 2021-05-24 DIAGNOSIS — Z20822 Contact with and (suspected) exposure to covid-19: Secondary | ICD-10-CM | POA: Diagnosis present

## 2021-05-24 DIAGNOSIS — Z853 Personal history of malignant neoplasm of breast: Secondary | ICD-10-CM | POA: Diagnosis not present

## 2021-05-24 DIAGNOSIS — Z87891 Personal history of nicotine dependence: Secondary | ICD-10-CM | POA: Diagnosis not present

## 2021-05-24 DIAGNOSIS — E119 Type 2 diabetes mellitus without complications: Secondary | ICD-10-CM | POA: Diagnosis present

## 2021-05-24 DIAGNOSIS — Z86010 Personal history of colonic polyps: Secondary | ICD-10-CM | POA: Diagnosis not present

## 2021-05-24 DIAGNOSIS — Z803 Family history of malignant neoplasm of breast: Secondary | ICD-10-CM | POA: Diagnosis not present

## 2021-05-24 LAB — CBC
HCT: 29.2 % — ABNORMAL LOW (ref 36.0–46.0)
Hemoglobin: 9.9 g/dL — ABNORMAL LOW (ref 12.0–15.0)
MCH: 30.8 pg (ref 26.0–34.0)
MCHC: 33.9 g/dL (ref 30.0–36.0)
MCV: 91 fL (ref 80.0–100.0)
Platelets: 174 10*3/uL (ref 150–400)
RBC: 3.21 MIL/uL — ABNORMAL LOW (ref 3.87–5.11)
RDW: 13.7 % (ref 11.5–15.5)
WBC: 9.7 10*3/uL (ref 4.0–10.5)
nRBC: 0 % (ref 0.0–0.2)

## 2021-05-24 MED ORDER — ONDANSETRON HCL 4 MG PO TABS: 4.0000 mg | ORAL_TABLET | Freq: Four times a day (QID) | ORAL | 0 refills | Status: DC | PRN

## 2021-05-24 MED ORDER — ONDANSETRON HCL 4 MG PO TABS: 4.0000 mg | ORAL_TABLET | Freq: Every day | ORAL | 1 refills | Status: DC | PRN

## 2021-05-24 NOTE — Progress Notes (Signed)
Patient discharged to home with instructions. 

## 2021-05-24 NOTE — Progress Notes (Signed)
Patient ID: Rebecca Guzman, female   DOB: 1934-07-05, 87 y.o.   MRN: [id number] IV fluids for lower BP when up. Delayed progress with PT. Change to admit status.

## 2021-05-24 NOTE — TOC Initial Note (Signed)
Transition of Care [facility] - Initial/Assessment Note    Patient Details  Name: Rebecca Guzman MRN: [id number] Date of Birth: [birth year]  Transition of Care [facility]) CM/SW Contact:    Marilu Favre, RN Phone Number: 05/24/2021, 8:39 AM  Clinical Narrative:                  Patient from home. HAs walker. Confirmed face sheet   Patient has arranged for her daughter to be with her in the mornings and her son to be with her in the afternoons.   Referral for HHPT to [place] with [facility]. Expected Discharge Plan: [place] Barriers to Discharge: No Barriers Identified   Patient Goals and CMS Choice Patient states their goals for this hospitalization and ongoing recovery are:: to return to home CMS Medicare.gov Compare Post Acute Care list provided to:: Patient Choice offered to / list presented to : Patient  Expected Discharge Plan and Services Expected Discharge Plan: [place]   Discharge Planning Services: CM Consult Post Acute Care Choice: [place] arrangements for the past 2 months: Single Family Home                 DME Arranged: N/A         HH Arranged: PT HH Agency: [place] Date [place]: 05/24/21 Time [place]: [phone] Representative spoke with at [place]: [place] Arrangements/Services Living arrangements for the past 2 months: [place] with:: Adult Children Patient language and need for interpreter reviewed:: Yes Do you feel safe going back to the place where you live?: Yes      Need for Family Participation in Patient Care: Yes (Comment) Care giver support system in place?: Yes (comment) Current home services: DME Criminal Activity/Legal Involvement Pertinent to Current Situation/Hospitalization: No - Comment as needed  Activities of Daily Living Home Assistive Devices/Equipment: Cane (specify quad or straight), Eyeglasses, Walker (specify  type), Blood pressure cuff, Hearing aid ADL Screening (condition at time of admission) Patient's cognitive ability adequate to safely complete daily activities?: Yes Is the patient deaf or have difficulty hearing?: Yes Does the patient have difficulty seeing, even when wearing glasses/contacts?: No Does the patient have difficulty concentrating, remembering, or making decisions?: No Patient able to express need for assistance with ADLs?: Yes Does the patient have difficulty dressing or bathing?: No Independently performs ADLs?: Yes (appropriate for developmental age) Does the patient have difficulty walking or climbing stairs?: Yes Weakness of Legs: Both Weakness of Arms/Hands: None  Permission Sought/Granted   Permission granted to share information with : No              Emotional Assessment Appearance:: Appears stated age Attitude/Demeanor/Rapport: Engaged Affect (typically observed): Accepting Orientation: : Oriented to Self, Oriented to Place, Oriented to  Time, Oriented to Situation Alcohol / Substance Use: Not Applicable Psych Involvement: No (comment)  Admission diagnosis:  Total knee replacement status [Z96.659] Patient Active Problem List   Diagnosis Date Noted   Total knee replacement status 05/21/2021   Genetic testing 06/07/2018   History of breast cancer 04/19/2018   Wears hearing aid    Wears glasses    Vaginal vault prolapse    Hiatal hernia    BPPV (benign paroxysmal positional vertigo)    Palpitations 10/31/2016   Radicular pain of right lower back 04/06/2015   Compression fracture of L3 lumbar vertebra 04/06/2015   Senile osteoporosis 04/06/2015  DDD (degenerative disc disease), lumbar 04/06/2015   Other malaise and fatigue 01/17/2014   Hx of adenomatous colonic polyps 07/11/2013   Unilateral primary osteoarthritis, right knee    Hyperlipidemia LDL goal <100    Bladder prolapse, female, acquired    Esophageal reflux 10/12/2011   PCP:  Hoyt Koch, MD Pharmacy:   [place], [place] - [address] AT [address] [place] [address] Phone: [phone] Fax: [phone]     Social Determinants of Health (SDOH) Interventions    Readmission Risk Interventions No flowsheet data found.

## 2021-05-24 NOTE — Progress Notes (Signed)
Physical Therapy Treatment Patient Details Name: Rebecca Guzman MRN: [id number] DOB: 07-15-1934 Today's Date: 05/24/2021   History of Present Illness Pt is an 87 y/o female s/p R TKA on 10/14. post-op orthostasis PMH includes breast cancer s/p lumpectomy and BPPV.    PT Comments    Continuing work on functional mobility and activity tolerance;  Session focused on therex and answering questions in prep for dc; ROM  and strength of knee progressing nicely; OK for dc home from PT standpoint    Recommendations for follow up therapy are one component of a multi-disciplinary discharge planning process, led by the attending physician.  Recommendations may be updated based on patient status, additional functional criteria and insurance authorization.  Follow Up Recommendations  Follow surgeon's recommendation for DC plan and follow-up therapies     Equipment Recommendations  None recommended by PT    Recommendations for Other Services       Precautions / Restrictions Precautions Precautions: Knee Precaution Booklet Issued: Yes (comment) Precaution Comments: Verbally reviewed knee precautions. Restrictions RLE Weight Bearing: Weight bearing as tolerated     Mobility  Bed Mobility                    Transfers                    Ambulation/Gait                 Stairs             Wheelchair Mobility    Modified Rankin (Stroke Patients Only)       Balance                                            Cognition Arousal/Alertness: Awake/alert Behavior During Therapy: WFL for tasks assessed/performed Overall Cognitive Status: Within Functional Limits for tasks assessed                                        Exercises Total Joint Exercises Ankle Circles/Pumps: AROM;Both;10 reps Quad Sets: AROM;Right;10 reps Short Arc Quad: AAROM;AROM;Right;10 reps Heel Slides: AAROM;Right;10 reps Straight Leg Raises:  AAROM;Right;5 reps Long Arc Quad: AROM;Right;10 reps Knee Flexion: AROM;10 reps;Seated (including self AAROM with LLE for overpressure) Goniometric ROM: approx 0-75    General Comments        Pertinent Vitals/Pain Pain Assessment: Faces Faces Pain Scale: Hurts a little bit Pain Location: R knee at end of session Pain Descriptors / Indicators: Grimacing;Guarding Pain Intervention(s): Monitored during session    Home Living                      Prior Function            PT Goals (current goals can now be found in the care plan section) Acute Rehab PT Goals Patient Stated Goal: to feel confident going home PT Goal Formulation: With patient Time For Goal Achievement: 06/04/21 Potential to Achieve Goals: Good Progress towards PT goals: Progressing toward goals    Frequency    7X/week      PT Plan Current plan remains appropriate    Co-evaluation              AM-PAC PT "6 Clicks" Mobility   Outcome  Measure  Help needed turning from your back to your side while in a flat bed without using bedrails?: None Help needed moving from lying on your back to sitting on the side of a flat bed without using bedrails?: A Little Help needed moving to and from a bed to a chair (including a wheelchair)?: A Little Help needed standing up from a chair using your arms (e.g., wheelchair or bedside chair)?: A Little Help needed to walk in hospital room?: A Little Help needed climbing 3-5 steps with a railing? : A Little 6 Click Score: 19    End of Session Equipment Utilized During Treatment: Gait belt Activity Tolerance: Patient tolerated treatment well Patient left: in chair;with call bell/phone within reach;with family/visitor present Nurse Communication: Mobility status;Other (comment) (OK for dc) PT Visit Diagnosis: Other abnormalities of gait and mobility (R26.89);Pain Pain - Right/Left: Right Pain - part of body: Knee     Time: 1330-1350 PT Time Calculation  (min) (ACUTE ONLY): 20 min  Charges:  $Therapeutic Exercise: 8-22 mins                     Rebecca Guzman, PT  Acute Rehabilitation Services Pager [phone] Office [phone]    Rebecca Guzman 05/24/2021, 2:47 PM

## 2021-05-24 NOTE — Plan of Care (Signed)
  Problem: Acute Rehab PT Goals(only PT should resolve) Goal: Pt Will Go Supine/Side To Sit Outcome: Adequate for Discharge Goal: Pt Will Go Sit To Supine/Side Outcome: Adequate for Discharge Goal: Patient Will Transfer Sit To/From Stand Outcome: Adequate for Discharge Goal: Pt Will Ambulate Outcome: Adequate for Discharge Goal: Pt Will Go Up/Down Stairs Outcome: Adequate for Discharge

## 2021-05-24 NOTE — Progress Notes (Signed)
Discharge instructions (including medications) discussed with and copy provided to patient/caregiver 

## 2021-05-24 NOTE — Progress Notes (Signed)
Physical Therapy Treatment Patient Details Name: Rebecca Guzman MRN: [id number] DOB: Jan 05, 1934 Today's Date: 05/24/2021   History of Present Illness Pt is an 87 y/o female s/p R TKA on 10/14. post-op orthostasis PMH includes breast cancer s/p lumpectomy and BPPV.    PT Comments    Continuing work on functional mobility and activity tolerance;  This am session is altogether better than yesterday, and I believe she is turning the corner; Walked household distance with minguard, progressing to Supervision, and stair training complete; no pre-syncopal symptoms reported with at least 15 to 20 minutes of walking and upright activity; She is moving well enough to consider dc home today, if she checks out medically  Recommendations for follow up therapy are one component of a multi-disciplinary discharge planning process, led by the attending physician.  Recommendations may be updated based on patient status, additional functional criteria and insurance authorization.  Follow Up Recommendations  Follow surgeon's recommendation for DC plan and follow-up therapies     Equipment Recommendations  None recommended by PT (has equipmetn)    Recommendations for Other Services       Precautions / Restrictions Precautions Precautions: Knee Precaution Booklet Issued: Yes (comment) Precaution Comments: Verbally reviewed knee precautions. Restrictions RLE Weight Bearing: Weight bearing as tolerated     Mobility  Bed Mobility Overal bed mobility: Needs Assistance Bed Mobility: Supine to Sit     Supine to sit: Min assist;Supervision     General bed mobility comments: Min assist to decr friction of R foot on bolster and allow pt to move her RLE to EOB; Supervision for teh act of pushing up to sitting position and squaring off at EOB    Transfers Overall transfer level: Needs assistance Equipment used: Rolling walker (2 wheeled);2 person hand held assist Transfers: Sit to/from Stand Sit to  Stand: Min guard (without physical contact)         General transfer comment: Stood from low bed, slow rise, but not needing physical assist; Stood from recliner more smoothly, with good push up on armrests  Ambulation/Gait Ambulation/Gait assistance: Min Gaffer (Feet): 140 Feet Assistive device: Rolling walker (2 wheeled) Gait Pattern/deviations: Step-through pattern (emerging) Gait velocity: Decreased   General Gait Details: Adjusted RW height for optimal fit; initally minguard assist for safety-- with incr distance, her R knee stance stability proved consistent, and progressed to supervision   Stairs Stairs: Yes Stairs assistance: Min assist Stair Management: No rails;Step to pattern;Backwards;With walker Number of Stairs: 4 General stair comments: Performed backwards technqiue for ascending steps; good step-to pattern; Pt expressed confidence in her ability to negotiate the steps into her home   Wheelchair Mobility    Modified Rankin (Stroke Patients Only)       Balance     Sitting balance-Leahy Scale: Good       Standing balance-Leahy Scale: Fair                              Cognition Arousal/Alertness: Awake/alert Behavior During Therapy: WFL for tasks assessed/performed Overall Cognitive Status: Within Functional Limits for tasks assessed                                        Exercises      General Comments General comments (skin integrity, edema, etc.): Discussed car transfers and answered questions re: managing at  home; Pt reported she spoke with [name], TOC RN, and is gladd HHPT follow up is set up; Pt indicated she does have a lift chair at home -- I encouraged her NOT to use the lift function.      Pertinent Vitals/Pain Pain Assessment: Faces Faces Pain Scale: Hurts little more Pain Location: R knee at end of session Pain Descriptors / Indicators: Grimacing;Guarding Pain Intervention(s):  Monitored during session    Home Living                      Prior Function            PT Goals (current goals can now be found in the care plan section) Acute Rehab PT Goals Patient Stated Goal: to feel confident going home PT Goal Formulation: With patient Time For Goal Achievement: 06/04/21 Potential to Achieve Goals: Good Progress towards PT goals: Progressing toward goals    Frequency    7X/week      PT Plan Current plan remains appropriate    Co-evaluation              AM-PAC PT "6 Clicks" Mobility   Outcome Measure  Help needed turning from your back to your side while in a flat bed without using bedrails?: None Help needed moving from lying on your back to sitting on the side of a flat bed without using bedrails?: A Little Help needed moving to and from a bed to a chair (including a wheelchair)?: A Little Help needed standing up from a chair using your arms (e.g., wheelchair or bedside chair)?: A Little Help needed to walk in hospital room?: A Little Help needed climbing 3-5 steps with a railing? : A Little 6 Click Score: 19    End of Session Equipment Utilized During Treatment: Gait belt Activity Tolerance: Patient tolerated treatment well Patient left: with call bell/phone within reach;Other (comment) (sitting on commode in bathroom; instructed to use teh pull string when done) Nurse Communication: Mobility status;Other (comment) (in bathroom) PT Visit Diagnosis: Other abnormalities of gait and mobility (R26.89);Pain Pain - Right/Left: Right Pain - part of body: Knee     Time: 0922-1004 PT Time Calculation (min) (ACUTE ONLY): 42 min  Charges:  $Gait Training: 23-37 mins                     Roney Marion, [place]  [place] Pager [phone] Office [phone]    [name] 05/24/2021, 10:38 AM

## 2021-05-25 DIAGNOSIS — K219 Gastro-esophageal reflux disease without esophagitis: Secondary | ICD-10-CM | POA: Diagnosis not present

## 2021-05-25 DIAGNOSIS — K449 Diaphragmatic hernia without obstruction or gangrene: Secondary | ICD-10-CM | POA: Diagnosis not present

## 2021-05-25 DIAGNOSIS — I499 Cardiac arrhythmia, unspecified: Secondary | ICD-10-CM | POA: Diagnosis not present

## 2021-05-25 DIAGNOSIS — M5116 Intervertebral disc disorders with radiculopathy, lumbar region: Secondary | ICD-10-CM | POA: Diagnosis not present

## 2021-05-25 DIAGNOSIS — J45909 Unspecified asthma, uncomplicated: Secondary | ICD-10-CM | POA: Diagnosis not present

## 2021-05-25 DIAGNOSIS — H8110 Benign paroxysmal vertigo, unspecified ear: Secondary | ICD-10-CM | POA: Diagnosis not present

## 2021-05-25 DIAGNOSIS — E785 Hyperlipidemia, unspecified: Secondary | ICD-10-CM | POA: Diagnosis not present

## 2021-05-25 DIAGNOSIS — Z471 Aftercare following joint replacement surgery: Secondary | ICD-10-CM | POA: Diagnosis not present

## 2021-05-25 DIAGNOSIS — M810 Age-related osteoporosis without current pathological fracture: Secondary | ICD-10-CM | POA: Diagnosis not present

## 2021-05-26 ENCOUNTER — Telehealth: Payer: Self-pay | Admitting: Orthopaedic Surgery

## 2021-05-26 NOTE — Telephone Encounter (Signed)
Verbal order given to Sonia Side

## 2021-05-26 NOTE — Telephone Encounter (Signed)
Sonia Side from [facility] called requesting verbal orders to from PT eval for pt 3 times a week. Please call Sonia Side at [phone]. Leave detailed message on secure line if unable to answer.

## 2021-05-27 DIAGNOSIS — Z471 Aftercare following joint replacement surgery: Secondary | ICD-10-CM | POA: Diagnosis not present

## 2021-05-27 DIAGNOSIS — E785 Hyperlipidemia, unspecified: Secondary | ICD-10-CM | POA: Diagnosis not present

## 2021-05-27 DIAGNOSIS — M5116 Intervertebral disc disorders with radiculopathy, lumbar region: Secondary | ICD-10-CM | POA: Diagnosis not present

## 2021-05-27 DIAGNOSIS — K219 Gastro-esophageal reflux disease without esophagitis: Secondary | ICD-10-CM | POA: Diagnosis not present

## 2021-05-27 DIAGNOSIS — M810 Age-related osteoporosis without current pathological fracture: Secondary | ICD-10-CM | POA: Diagnosis not present

## 2021-05-27 DIAGNOSIS — J45909 Unspecified asthma, uncomplicated: Secondary | ICD-10-CM | POA: Diagnosis not present

## 2021-05-27 DIAGNOSIS — I499 Cardiac arrhythmia, unspecified: Secondary | ICD-10-CM | POA: Diagnosis not present

## 2021-05-27 DIAGNOSIS — K449 Diaphragmatic hernia without obstruction or gangrene: Secondary | ICD-10-CM | POA: Diagnosis not present

## 2021-05-27 DIAGNOSIS — H8110 Benign paroxysmal vertigo, unspecified ear: Secondary | ICD-10-CM | POA: Diagnosis not present

## 2021-05-28 ENCOUNTER — Telehealth: Payer: Self-pay

## 2021-05-28 ENCOUNTER — Ambulatory Visit: Payer: Self-pay

## 2021-05-28 ENCOUNTER — Other Ambulatory Visit: Payer: Self-pay

## 2021-05-28 ENCOUNTER — Ambulatory Visit (INDEPENDENT_AMBULATORY_CARE_PROVIDER_SITE_OTHER): Payer: Medicare PPO | Admitting: Orthopaedic Surgery

## 2021-05-28 DIAGNOSIS — M1711 Unilateral primary osteoarthritis, right knee: Secondary | ICD-10-CM | POA: Diagnosis not present

## 2021-05-28 DIAGNOSIS — Z96651 Presence of right artificial knee joint: Secondary | ICD-10-CM

## 2021-05-28 NOTE — Telephone Encounter (Signed)
Transition Care Management Unsuccessful Follow-up Telephone Call  Date of discharge and from where:  05/24/2021 [facility] Attempts:  1st Attempt  Reason for unsuccessful TCM follow-up call:  Left voice message   Tomasa Rand, RN, BSN, CEN [place] Coordinator [phone]

## 2021-05-28 NOTE — Telephone Encounter (Signed)
Transition Care Management Follow-up Telephone Call Date of discharge and from where: 05/24/2021  Rebecca Guzman How have you been since you were released from the hospital? "Doing very Well" Any questions or concerns? No  Items Reviewed: Did the pt receive and understand the discharge instructions provided? Yes  Medications obtained and verified? Yes  Other? No  Any new allergies since your discharge? No  Dietary orders reviewed? Yes Do you have support at home? Yes   Home Care and Equipment/Supplies: Were home health services ordered? yes If so, what is the name of the agency? [place]  Has the agency set up a time to come to the patient's home? yes Were any new equipment or medical supplies ordered?  No What is the name of the medical supply agency?  Were you able to get the supplies/equipment? not applicable Do you have any questions related to the use of the equipment or supplies? No  Functional Questionnaire: (I = Independent and D = Dependent) ADLs: I  Bathing/Dressing- I  Meal Prep- ASSISTANCE FROM FAMILY  Eating- I  Maintaining continence- I  Transferring/Ambulation- I  USING WALKER  Managing Meds- I  Follow up appointments reviewed:  PCP Hospital f/u appt confirmed? No   Specialist Hospital f/u appt confirmed?  Scheduled to see YATES on TODAY  Are transportation arrangements needed?  If their condition worsens, is the pt aware to call PCP or go to the Emergency Dept.? YES Was the patient provided with contact information for the PCP's office or ED? Yes Was to pt encouraged to call back with questions or concerns? Yes  Tomasa Rand, RN, BSN, CEN [facility] [employer] [phone]

## 2021-05-28 NOTE — Telephone Encounter (Signed)
Transition Care Management Unsuccessful Follow-up Telephone Call  Date of discharge and from where:  05/24/2021  [facility]  Attempts:  1st Attempt  Reason for unsuccessful TCM follow-up call:  Left voice message  Tomasa Rand, RN, BSN, CEN [place] Coordinator [phone]

## 2021-05-28 NOTE — Addendum Note (Signed)
Addended by: Precious Bard on: 05/28/2021 03:55 PM   Modules accepted: Orders

## 2021-05-28 NOTE — Progress Notes (Signed)
Post-Op Visit Note   Patient: Rebecca Guzman           Date of Birth: 1934/06/20           MRN: [id number] Visit Date: 05/28/2021 PCP: Hoyt Koch, MD   Assessment & Plan: Post right total knee arthroplasty.  Steri-Strips changed and Mepilex.  She can remove Mepilex in 1 week and then let Steri-Strips come off.  Continue home health PT for 1 more week then outpatient therapy 3 times a week.  Recheck 4 weeks.  Chief Complaint:  Chief Complaint  Patient presents with   Right Knee - Pain   Visit Diagnoses:  1. Unilateral primary osteoarthritis, right knee   2. S/P total knee arthroplasty, right     Plan: Return 4 weeks  Follow-Up Instructions: No follow-ups on file.   Orders:  Orders Placed This Encounter  Procedures   XR Knee 1-2 Views Right   No orders of the defined types were placed in this encounter.   Imaging: No results found.  PMFS History: Patient Active Problem List   Diagnosis Date Noted   S/P total knee arthroplasty, right 05/21/2021   Genetic testing 06/07/2018   History of breast cancer 04/19/2018   Wears hearing aid    Wears glasses    Vaginal vault prolapse    Hiatal hernia    BPPV (benign paroxysmal positional vertigo)    Palpitations 10/31/2016   Radicular pain of right lower back 04/06/2015   Compression fracture of L3 lumbar vertebra 04/06/2015   Senile osteoporosis 04/06/2015   DDD (degenerative disc disease), lumbar 04/06/2015   Other malaise and fatigue 01/17/2014   Hx of adenomatous colonic polyps 07/11/2013   Hyperlipidemia LDL goal <100    Bladder prolapse, female, acquired    Esophageal reflux 10/12/2011   Past Medical History:  Diagnosis Date   Asthma    BPPV (benign paroxysmal positional vertigo)    Breast cancer ([place])    left mastectomy 1992, right lumpectomy 2019   Cancer ([place])    Compression fracture of L3 lumbar vertebra 04/06/2015   Dysrhythmia 2019   pt states Cardiologist told her it is "pre A-fib"    Family history of breast cancer    Family history of colon cancer    Family history of pancreatic cancer    GERD (gastroesophageal reflux disease)    Hiatal hernia    pt states she doesnt have this   History of adenomatous polyp of colon    tubular adenoma 2005 and  2014 tubular adenoma and  hyperplastic polyp   History of breast cancer per pt no recurrence   dx 1992 --  s/p  left breast mastectomy and chemotherapy   Hyperlipidemia LDL goal <100    Osteoarthritis of right knee    Personal history of chemotherapy 1992   PONV (postoperative nausea and vomiting)    and "gets weak and light-leaded"   Sensation of pressure in bladder area    Vaginal vault prolapse    anterior   Wears glasses    Wears hearing aid    left only    Family History  Problem Relation Age of Onset   Heart attack Mother    Colon cancer Mother 48   Hypertension Mother    Heart disease Father    Breast cancer Sister        pat half sister dx in her 32s   Pancreatic cancer Other 21    Past Surgical History:  Procedure  Laterality Date   BREAST BIOPSY Right 04/19/2018   BREAST LUMPECTOMY Right 05/17/2018   BREAST LUMPECTOMY WITH RADIOACTIVE SEED LOCALIZATION Right 05/17/2018   Procedure: RIGHT BREAST LUMPECTOMY WITH RADIOACTIVE SEED LOCALIZATION;  Surgeon: [name], MD;  Location: [place];  Service: General;  Laterality: Right;   CATARACT EXTRACTION W/ INTRAOCULAR LENS  IMPLANT, BILATERAL  2014   CHOLECYSTECTOMY  1985   COLONOSCOPY  last one 06-04-2013   CYSTOCELE REPAIR N/A 02/29/2016   Procedure: ANTERIOR VAULT PROLAPSE REPAIR COLOPLAST Regino Ramirez FIXATION AUGMENTED AXIS DERMIS REPAIR  ;  Surgeon: Carolan Clines, MD;  Location: [place];  Service: Urology;  Laterality: N/A;   ESOPHAGOGASTRODUODENOSCOPY  08-31-2009   LAPAROSCOPIC ASSISTED VAGINAL HYSTERECTOMY  01-10-2000   w/ Bilateral Salpingoophorectomy /  Anterior & Posterior repair/  Pubovaginal Sling   MASTECTOMY Left 1992    TOTAL KNEE ARTHROPLASTY Right 05/21/2021   Procedure: RIGHT TOTAL KNEE ARTHROPLASTY;  Surgeon: Marybelle Killings, MD;  Location: [facility];  Service: Orthopedics;  Laterality: Right;   Social History   Occupational History   Occupation: Retired Product manager: RETIRED  Tobacco Use   Smoking status: Former    Years: 13.00    Types: Cigarettes    Quit date: 10/12/1983    Years since quitting: 37.6   Smokeless tobacco: Never  Vaping Use   Vaping Use: Never used  Substance and Sexual Activity   Alcohol use: No   Drug use: No   Sexual activity: Not on file

## 2021-05-29 DIAGNOSIS — H8110 Benign paroxysmal vertigo, unspecified ear: Secondary | ICD-10-CM | POA: Diagnosis not present

## 2021-05-29 DIAGNOSIS — J45909 Unspecified asthma, uncomplicated: Secondary | ICD-10-CM | POA: Diagnosis not present

## 2021-05-29 DIAGNOSIS — E785 Hyperlipidemia, unspecified: Secondary | ICD-10-CM | POA: Diagnosis not present

## 2021-05-29 DIAGNOSIS — Z471 Aftercare following joint replacement surgery: Secondary | ICD-10-CM | POA: Diagnosis not present

## 2021-05-29 DIAGNOSIS — I499 Cardiac arrhythmia, unspecified: Secondary | ICD-10-CM | POA: Diagnosis not present

## 2021-05-29 DIAGNOSIS — M810 Age-related osteoporosis without current pathological fracture: Secondary | ICD-10-CM | POA: Diagnosis not present

## 2021-05-29 DIAGNOSIS — K219 Gastro-esophageal reflux disease without esophagitis: Secondary | ICD-10-CM | POA: Diagnosis not present

## 2021-05-29 DIAGNOSIS — M5116 Intervertebral disc disorders with radiculopathy, lumbar region: Secondary | ICD-10-CM | POA: Diagnosis not present

## 2021-05-29 DIAGNOSIS — K449 Diaphragmatic hernia without obstruction or gangrene: Secondary | ICD-10-CM | POA: Diagnosis not present

## 2021-05-31 DIAGNOSIS — I499 Cardiac arrhythmia, unspecified: Secondary | ICD-10-CM | POA: Diagnosis not present

## 2021-05-31 DIAGNOSIS — Z471 Aftercare following joint replacement surgery: Secondary | ICD-10-CM | POA: Diagnosis not present

## 2021-05-31 DIAGNOSIS — M810 Age-related osteoporosis without current pathological fracture: Secondary | ICD-10-CM | POA: Diagnosis not present

## 2021-05-31 DIAGNOSIS — E785 Hyperlipidemia, unspecified: Secondary | ICD-10-CM | POA: Diagnosis not present

## 2021-05-31 DIAGNOSIS — H8110 Benign paroxysmal vertigo, unspecified ear: Secondary | ICD-10-CM | POA: Diagnosis not present

## 2021-05-31 DIAGNOSIS — J45909 Unspecified asthma, uncomplicated: Secondary | ICD-10-CM | POA: Diagnosis not present

## 2021-05-31 DIAGNOSIS — M5116 Intervertebral disc disorders with radiculopathy, lumbar region: Secondary | ICD-10-CM | POA: Diagnosis not present

## 2021-05-31 DIAGNOSIS — K449 Diaphragmatic hernia without obstruction or gangrene: Secondary | ICD-10-CM | POA: Diagnosis not present

## 2021-05-31 DIAGNOSIS — K219 Gastro-esophageal reflux disease without esophagitis: Secondary | ICD-10-CM | POA: Diagnosis not present

## 2021-06-02 ENCOUNTER — Telehealth: Payer: Self-pay | Admitting: Orthopaedic Surgery

## 2021-06-02 ENCOUNTER — Other Ambulatory Visit: Payer: Self-pay | Admitting: Orthopaedic Surgery

## 2021-06-02 DIAGNOSIS — H8110 Benign paroxysmal vertigo, unspecified ear: Secondary | ICD-10-CM | POA: Diagnosis not present

## 2021-06-02 DIAGNOSIS — M5116 Intervertebral disc disorders with radiculopathy, lumbar region: Secondary | ICD-10-CM | POA: Diagnosis not present

## 2021-06-02 DIAGNOSIS — M810 Age-related osteoporosis without current pathological fracture: Secondary | ICD-10-CM | POA: Diagnosis not present

## 2021-06-02 DIAGNOSIS — I499 Cardiac arrhythmia, unspecified: Secondary | ICD-10-CM | POA: Diagnosis not present

## 2021-06-02 DIAGNOSIS — Z471 Aftercare following joint replacement surgery: Secondary | ICD-10-CM | POA: Diagnosis not present

## 2021-06-02 DIAGNOSIS — J45909 Unspecified asthma, uncomplicated: Secondary | ICD-10-CM | POA: Diagnosis not present

## 2021-06-02 DIAGNOSIS — K449 Diaphragmatic hernia without obstruction or gangrene: Secondary | ICD-10-CM | POA: Diagnosis not present

## 2021-06-02 DIAGNOSIS — K219 Gastro-esophageal reflux disease without esophagitis: Secondary | ICD-10-CM | POA: Diagnosis not present

## 2021-06-02 DIAGNOSIS — E785 Hyperlipidemia, unspecified: Secondary | ICD-10-CM | POA: Diagnosis not present

## 2021-06-02 MED ORDER — OXYCODONE-ACETAMINOPHEN 5-325 MG PO TABS: 1.0000 | ORAL_TABLET | Freq: Four times a day (QID) | ORAL | 0 refills | Status: DC | PRN

## 2021-06-02 NOTE — Telephone Encounter (Signed)
Please advise 

## 2021-06-02 NOTE — Telephone Encounter (Signed)
Pt called stating she has some questions about her pain medication and would like a CB please.   [phone]

## 2021-06-04 DIAGNOSIS — I499 Cardiac arrhythmia, unspecified: Secondary | ICD-10-CM | POA: Diagnosis not present

## 2021-06-04 DIAGNOSIS — H8110 Benign paroxysmal vertigo, unspecified ear: Secondary | ICD-10-CM | POA: Diagnosis not present

## 2021-06-04 DIAGNOSIS — Z471 Aftercare following joint replacement surgery: Secondary | ICD-10-CM | POA: Diagnosis not present

## 2021-06-04 DIAGNOSIS — M5116 Intervertebral disc disorders with radiculopathy, lumbar region: Secondary | ICD-10-CM | POA: Diagnosis not present

## 2021-06-04 DIAGNOSIS — J45909 Unspecified asthma, uncomplicated: Secondary | ICD-10-CM | POA: Diagnosis not present

## 2021-06-04 DIAGNOSIS — M810 Age-related osteoporosis without current pathological fracture: Secondary | ICD-10-CM | POA: Diagnosis not present

## 2021-06-04 DIAGNOSIS — E785 Hyperlipidemia, unspecified: Secondary | ICD-10-CM | POA: Diagnosis not present

## 2021-06-04 DIAGNOSIS — K219 Gastro-esophageal reflux disease without esophagitis: Secondary | ICD-10-CM | POA: Diagnosis not present

## 2021-06-04 DIAGNOSIS — K449 Diaphragmatic hernia without obstruction or gangrene: Secondary | ICD-10-CM | POA: Diagnosis not present

## 2021-06-04 NOTE — Discharge Summary (Signed)
Patient ID: Rebecca Guzman MRN: [id number] DOB/AGE: 1933/11/25 87 y.o.  Admit date: 05/21/2021 Discharge date: 05/24/2021  Admission Diagnoses:  Active Problems:   S/P total knee arthroplasty, right   Discharge Diagnoses:  Active Problems:   S/P total knee arthroplasty, right  status post Procedure(s): RIGHT TOTAL KNEE ARTHROPLASTY  Past Medical History:  Diagnosis Date   Asthma    BPPV (benign paroxysmal positional vertigo)    Breast cancer ([place])    left mastectomy 1992, right lumpectomy 2019   Cancer ([place])    Compression fracture of L3 lumbar vertebra 04/06/2015   Dysrhythmia 2019   pt states Cardiologist told her it is "pre A-fib"   Family history of breast cancer    Family history of colon cancer    Family history of pancreatic cancer    GERD (gastroesophageal reflux disease)    Hiatal hernia    pt states she doesnt have this   History of adenomatous polyp of colon    tubular adenoma 2005 and  2014 tubular adenoma and  hyperplastic polyp   History of breast cancer per pt no recurrence   dx 1992 --  s/p  left breast mastectomy and chemotherapy   Hyperlipidemia LDL goal <100    Osteoarthritis of right knee    Personal history of chemotherapy 1992   PONV (postoperative nausea and vomiting)    and "gets weak and light-leaded"   Sensation of pressure in bladder area    Vaginal vault prolapse    anterior   Wears glasses    Wears hearing aid    left only    Surgeries: Procedure(s): RIGHT TOTAL KNEE ARTHROPLASTY on 05/21/2021   Consultants:   Discharged Condition: Improved  Hospital Course: Rebecca Guzman is an 87 y.o. female who was admitted 05/21/2021 for operative treatment of right knee djd. Patient failed conservative treatments (please see the history and physical for the specifics) and had severe unremitting pain that affects sleep, daily activities and work/hobbies. After pre-op clearance, the patient was taken to the operating room on 05/21/2021 and  underwent  Procedure(s): RIGHT TOTAL KNEE ARTHROPLASTY.    Patient was given perioperative antibiotics:  Anti-infectives (From admission, onward)    Start     Dose/Rate Route Frequency Ordered Stop   05/21/21 0615  ceFAZolin (ANCEF) IVPB 2g/100 mL premix        2 g 200 mL/hr over 30 Minutes Intravenous On call to O.R. 05/21/21 [id number] 05/21/21 0805   05/21/21 0615  ceFAZolin (ANCEF) 2-4 GM/100ML-% IVPB       Note to Pharmacy: Rocky Morel   : cabinet override      05/21/21 0615 05/21/21 0806        Patient was given sequential compression devices and early ambulation to prevent DVT.   Patient benefited maximally from hospital stay and there were no complications. At the time of discharge, the patient was urinating/moving their bowels without difficulty, tolerating a regular diet, pain is controlled with oral pain medications and they have been cleared by PT/OT.   Recent vital signs: No data found.   Recent laboratory studies: No results for input(s): WBC, HGB, HCT, PLT, NA, K, CL, CO2, BUN, CREATININE, GLUCOSE, INR, CALCIUM in the last 72 hours.  Invalid input(s): PT, 2   Discharge Medications:   Allergies as of 05/24/2021       Reactions   Codeine Nausea And Vomiting   Morphine And Related Other (See Comments)   Nausea, and drop in blood pressure  Medication List     STOP taking these medications    acetaminophen 500 MG tablet Commonly known as: TYLENOL       TAKE these medications    aspirin 325 MG tablet Commonly known as: Bayer Aspirin Take 1 tablet (325 mg total) by mouth daily. Take one regular aspirin tablet for one month to help prevent blood clots then you may go back on your baby aspirin  and stop the regular aspirin   Calcium 600+D3 Plus Minerals 600-800 MG-UNIT Tabs Take 2 tablets by mouth daily.   denosumab 60 MG/ML Sosy injection Commonly known as: PROLIA Inject 60 mg into the skin every 6 (six) months.   fexofenadine 180 MG  tablet Commonly known as: ALLEGRA Take 180 mg by mouth daily.   Fish Oil 1000 MG Caps Take 1 capsule (1,000 mg total) by mouth daily.   methocarbamol 500 MG tablet Commonly known as: Robaxin Take 1 tablet (500 mg total) by mouth every 8 (eight) hours as needed for muscle spasms.   metoprolol tartrate 25 MG tablet Commonly known as: LOPRESSOR TAKE 1 TABLET(25 MG) BY MOUTH TWICE DAILY   multivitamin tablet Take 1 tablet by mouth daily.   omeprazole 40 MG capsule Commonly known as: PRILOSEC Take 1 capsule (40 mg total) by mouth daily.   ondansetron 4 MG tablet Commonly known as: Zofran Take 1 tablet (4 mg total) by mouth daily as needed for nausea or vomiting.   PRESERVISION AREDS 2 PO Take 1 tablet by mouth daily.   PROBIOTIC FORMULA PO Take 1 tablet by mouth every evening.   rosuvastatin 5 MG tablet Commonly known as: CRESTOR Take 1 tablet (5 mg total) by mouth 4 (four) times a week.        Diagnostic Studies: XR Knee 1-2 Views Right  Result Date: 05/28/2021 AP lateral right knee x-rays are obtained and reviewed this shows total knee arthroplasty without loosening or subsidence.  Acceptable position. Impression: Satisfactory right total knee arthroplasty.      Follow-up Information     Marybelle Killings, MD Follow up in 1 week(s).   Specialty: Orthopedic Surgery Contact information: [place] [address] [place], [place] Follow up.   Specialty: [facility] Contact information: [address] [place] [address] [phone]                 Discharge Plan:  discharge to home  Disposition:     Signed: Benjiman Core  06/04/2021, 11:14 AM

## 2021-06-08 ENCOUNTER — Other Ambulatory Visit: Payer: Self-pay

## 2021-06-08 ENCOUNTER — Encounter: Payer: Self-pay | Admitting: Physical Therapy

## 2021-06-08 ENCOUNTER — Ambulatory Visit: Payer: Medicare PPO | Admitting: Physical Therapy

## 2021-06-08 DIAGNOSIS — R262 Difficulty in walking, not elsewhere classified: Secondary | ICD-10-CM | POA: Diagnosis not present

## 2021-06-08 DIAGNOSIS — R600 Localized edema: Secondary | ICD-10-CM | POA: Diagnosis not present

## 2021-06-08 DIAGNOSIS — M6281 Muscle weakness (generalized): Secondary | ICD-10-CM

## 2021-06-08 DIAGNOSIS — M25561 Pain in right knee: Secondary | ICD-10-CM

## 2021-06-08 DIAGNOSIS — M25661 Stiffness of right knee, not elsewhere classified: Secondary | ICD-10-CM | POA: Diagnosis not present

## 2021-06-08 NOTE — Therapy (Signed)
[facility] Physical Therapy [address] [place], [place], [address] Phone: [phone]   Fax:  [phone]  Physical Therapy Evaluation Referring diagnosis? M17.11  Treatment diagnosis? (if different than referring diagnosis) m25.561 What was this (referring dx) caused by? [x]  Surgery []  Fall []  Ongoing issue [x]  Arthritis []  Other: ____________  Laterality: [x]  Rt []  Lt []  Both  Check all possible CPT codes:      [x]  97110 (Therapeutic Exercise)  []  92507 (SLP Treatment)  [x]  97112 (Neuro Re-ed)   []  92526 (Swallowing Treatment)   [x]  97116 (Gait Training)   []  [id number] (Cognitive Training, 1st 15 minutes) [x]  97140 (Manual Therapy)   []  97130 (Cognitive Training, each add'l 15 minutes)  [x]  97530 (Therapeutic Activities)  []  Other, List CPT Code ____________    []  97535 (Self Care)       []  All codes above (97110 - 97535)  []  97012 (Mechanical Traction)  [x]  97014 (E-stim Unattended)  []  97032 (E-stim manual)  [x]  97033 (Ionto)  [x]  97035 (Ultrasound)  []  97760 (Orthotic Fit) []  97750 (Physical Performance Training) []  [id number] (Aquatic Therapy) []  97034 (Contrast Bath) []  [id number] (Paraffin) []  97597 (Wound Care 1st 20 sq cm) []  97598 (Wound Care each add'l 20 sq cm) [x]  97016 (Vasopneumatic Device) []  [id number] (Orthotic Training) []  [id number] (Prosthetic Training)   Patient Details  Name: Rebecca Guzman MRN: [id number] Date of Birth: 1934-03-31 Referring Provider (PT): Marybelle Killings, MD   Encounter Date: 06/08/2021   PT End of Session - 06/08/21 1605     Visit Number 1    Number of Visits 12    Date for PT Re-Evaluation 07/20/21    Authorization Type [employer]    PT Start Time 1696    PT Stop Time 1600    PT Time Calculation (min) 45 min    Activity Tolerance Patient tolerated treatment well    Behavior During Therapy [facility] for tasks assessed/performed             Past Medical History:  Diagnosis Date   Asthma    BPPV (benign paroxysmal  positional vertigo)    Breast cancer ([place])    left mastectomy 1992, right lumpectomy 2019   Cancer ([place])    Compression fracture of L3 lumbar vertebra 04/06/2015   Dysrhythmia 2019   pt states Cardiologist told her it is "pre A-fib"   Family history of breast cancer    Family history of colon cancer    Family history of pancreatic cancer    GERD (gastroesophageal reflux disease)    Hiatal hernia    pt states she doesnt have this   History of adenomatous polyp of colon    tubular adenoma 2005 and  2014 tubular adenoma and  hyperplastic polyp   History of breast cancer per pt no recurrence   dx 1992 --  s/p  left breast mastectomy and chemotherapy   Hyperlipidemia LDL goal <100    Osteoarthritis of right knee    Personal history of chemotherapy 1992   PONV (postoperative nausea and vomiting)    and "gets weak and light-leaded"   Sensation of pressure in bladder area    Vaginal vault prolapse    anterior   Wears glasses    Wears hearing aid    left only    Past Surgical History:  Procedure Laterality Date   BREAST BIOPSY Right 04/19/2018   BREAST LUMPECTOMY Right 05/17/2018   BREAST LUMPECTOMY WITH RADIOACTIVE SEED LOCALIZATION Right 05/17/2018   Procedure:  RIGHT BREAST LUMPECTOMY WITH RADIOACTIVE SEED LOCALIZATION;  Surgeon: [name], MD;  Location: [place];  Service: General;  Laterality: Right;   CATARACT EXTRACTION W/ INTRAOCULAR LENS  IMPLANT, BILATERAL  2014   CHOLECYSTECTOMY  1985   COLONOSCOPY  last one 06-04-2013   CYSTOCELE REPAIR N/A 02/29/2016   Procedure: ANTERIOR VAULT PROLAPSE REPAIR COLOPLAST [place] FIXATION AUGMENTED AXIS DERMIS REPAIR  ;  Surgeon: Carolan Clines, MD;  Location: [place];  Service: Urology;  Laterality: N/A;   ESOPHAGOGASTRODUODENOSCOPY  08-31-2009   LAPAROSCOPIC ASSISTED VAGINAL HYSTERECTOMY  01-10-2000   w/ Bilateral Salpingoophorectomy /  Anterior & Posterior repair/  Pubovaginal Sling   MASTECTOMY Left 1992    TOTAL KNEE ARTHROPLASTY Right 05/21/2021   Procedure: RIGHT TOTAL KNEE ARTHROPLASTY;  Surgeon: Marybelle Killings, MD;  Location: [place];  Service: Orthopedics;  Laterality: Right;    There were no vitals filed for this visit.    Subjective Assessment - 06/08/21 1522     Subjective She had Rt TKA 05/21/21, she is overall doing well she reports but also is having back pain resurfacing especially when she lays down.    Pertinent History PMH: vertigo,lumbar DDD,osteoporosis, breast ca treated with chemo and mastectomy    How long can you stand comfortably? 15 minutes    Patient Stated Goals walking in neighborhood, carrying in groceries, housekeeping    Currently in Pain? Yes    Pain Score 3     Pain Location Knee    Pain Orientation Right    Pain Descriptors / Indicators Aching    Pain Type Surgical pain    Pain Radiating Towards denies    Pain Onset More than a month ago    Pain Frequency Intermittent    Aggravating Factors  prolonged extending her knee or stretching her knee to far    Pain Relieving Factors ice                [facility] PT Assessment - 06/08/21 0001       Assessment   Medical Diagnosis Rt knee TKA    Referring Provider (PT) Marybelle Killings, MD    Onset Date/Surgical Date 05/21/21    Next MD Visit 06/25/21    Prior Therapy [facility]      Precautions   Precautions None      Restrictions   Weight Bearing Restrictions No      Balance Screen   Has the patient fallen in the past 6 months No    Has the patient had a decrease in activity level because of a fear of falling?  No    Is the patient reluctant to leave their home because of a fear of falling?  No      Home Environment   Living Environment Private residence    Additional Comments 14 steps to get up to bedroom, she has been sleeping downstairs for now      Prior Function   Level of [place] Retired    Leisure walks      Cognition   Overall Cognitive Status Within Functional  Limits for tasks assessed      Observation/Other Assessments   Observations moderate edema, no other signs of infection    Focus on Therapeutic Outcomes (FOTO)  35% functional intake      ROM / Strength   AROM / PROM / Strength AROM;PROM;Strength      AROM   AROM Assessment Site Knee  Right/Left Knee Right    Right Knee Extension -5    Right Knee Flexion 95      PROM   PROM Assessment Site Knee    Right/Left Knee Right    Right Knee Extension -3    Right Knee Flexion 105      Strength   Overall Strength Comments 4/5 MMT Rt hip/knee grossly      Standardized Balance Assessment   Standardized Balance Assessment Five Times Sit to Stand;Timed Up and Go Test    Five times sit to stand comments  20.6 using UE support      Timed Up and Go Test   Normal TUG (seconds) 17    TUG Comments no AD                        Objective measurements completed on examination: See above findings.       [facility] PT Treatment/Exercise - 06/08/21 0001       Exercises   Exercises Knee/Hip      Knee/Hip Exercises: Stretches   Active Hamstring Stretch Right;1 rep;30 seconds    Active Hamstring Stretch Limitations seated      Knee/Hip Exercises: Aerobic   Nustep L6 X6 min LE/LE4      Knee/Hip Exercises: Standing   Other Standing Knee Exercises mini squats with UE support X 5                     PT Education - 06/08/21 1604     Education Details HEP,POC    Person(s) Educated Patient    Methods Explanation;Demonstration;Verbal cues;Handout    Comprehension Verbalized understanding;Returned demonstration;Need further instruction              PT Short Term Goals - 06/08/21 1629       PT SHORT TERM GOAL #1   Title independent with HEP    Time 4    Period Weeks    Status New    Target Date 07/06/21      PT SHORT TERM GOAL #2   Title perform 5 times sit to stand less than 16 seconds    Time 4    Period Weeks    Status New    Target Date  07/06/21               PT Long Term Goals - 06/08/21 1630       PT LONG TERM GOAL #1   Title Pt will improve FOTO score to 52% functional    Time 6    Period Weeks    Status New    Target Date 07/20/21      PT LONG TERM GOAL #2   Title report pain < 3/10 with activity for improved mobility    Time 6    Period Weeks    Status New      PT LONG TERM GOAL #3   Title She will improve Rt knee PROM 0-120 deg to improve function    Time 6    Period Weeks    Status New      PT LONG TERM GOAL #4   Title Pt will improve Rt knee strength to 5/5 MMT to improve functional strength    Time 6    Period Weeks    Status New      PT LONG TERM GOAL #5   Title Pt will improve 5 times sit to stand test and TUG  test to 13 seconds or less without AD to show improved balance and gait speed    Time 6    Period Weeks    Status New                    Plan - 06/08/21 1606     Clinical Impression Statement Pt presents S/P Rt TKA 05/21/21. She is doing overall pretty well up to this point. She will benefit from skilled PT to address her functional deficits below with proposed PT interventions listed below.    Personal Factors and Comorbidities Comorbidity 3+    Comorbidities PMH: vertigo,lumbar DDD,osteoporosis, breast ca treated with chemo and mastectomy,    Examination-Activity Limitations Bend;Lift;Stand;Stairs;Squat;Sleep;Locomotion Level    Examination-Participation Restrictions Cleaning;Driving;Community Activity;Laundry;Shop    Stability/Clinical Decision Making Evolving/Moderate complexity    Clinical Decision Making Moderate    Rehab Potential Good    PT Frequency 2x / week    PT Duration 6 weeks    PT Treatment/Interventions Cryotherapy;Electrical Stimulation;Traction;Ultrasound;Iontophoresis 4mg /ml Dexamethasone;Moist Heat;Gait training;Therapeutic activities;Therapeutic exercise;Neuromuscular re-education;Stair training;Patient/family education;Manual techniques;Joint  Manipulations;Dry needling;Passive range of motion;Taping;Vasopneumatic Device    PT Next Visit Plan gait with SPC progressing to no AD, Rt knee ROM and strength    PT Home Exercise Plan Access Code: [email or web address]    Consulted and Agree with Plan of Care Patient             Patient will benefit from skilled therapeutic intervention in order to improve the following deficits and impairments:  Decreased activity tolerance, Decreased balance, Decreased endurance, Decreased mobility, Decreased range of motion, Decreased strength, Difficulty walking, Increased edema, Impaired flexibility, Pain  Visit Diagnosis: Acute pain of right knee  Stiffness of right knee, not elsewhere classified  Muscle weakness (generalized)  Difficulty in walking, not elsewhere classified  Localized edema     Problem List Patient Active Problem List   Diagnosis Date Noted   S/P total knee arthroplasty, right 05/21/2021   Genetic testing 06/07/2018   History of breast cancer 04/19/2018   Wears hearing aid    Wears glasses    Vaginal vault prolapse    Hiatal hernia    BPPV (benign paroxysmal positional vertigo)    Palpitations 10/31/2016   Radicular pain of right lower back 04/06/2015   Compression fracture of L3 lumbar vertebra 04/06/2015   Senile osteoporosis 04/06/2015   DDD (degenerative disc disease), lumbar 04/06/2015   Other malaise and fatigue 01/17/2014   Hx of adenomatous colonic polyps 07/11/2013   Hyperlipidemia LDL goal <100    Bladder prolapse, female, acquired    Esophageal reflux 10/12/2011    Debbe Odea, PT,DPT 06/08/2021, 4:37 PM  [facility] Physical Therapy [address] [place], [place], [address] Phone: [phone]   Fax:  [phone]  Name: Rebecca Guzman MRN: [id number] Date of Birth: Jun 30, 1934

## 2021-06-08 NOTE — Patient Instructions (Signed)
Access Code: [email or web address] URL: [email or web address] Date: 06/08/2021 Prepared by: Elsie Ra  Exercises Seated Hamstring Stretch - 2 x daily - 6 x weekly - 1 sets - 3 reps - 30 hold Seated Straight Leg Heel Taps - 2 x daily - 6 x weekly - 2-3 sets - 10 reps Supine Heel Slide with Strap - 2 x daily - 6 x weekly - 1-2 sets - 10 reps - 5 hold Mini Squat with Counter Support - 2 x daily - 6 x weekly - 1-2 sets - 10 reps

## 2021-06-15 ENCOUNTER — Ambulatory Visit: Payer: Medicare PPO | Admitting: Physical Therapy

## 2021-06-15 ENCOUNTER — Other Ambulatory Visit: Payer: Self-pay

## 2021-06-15 DIAGNOSIS — M6281 Muscle weakness (generalized): Secondary | ICD-10-CM | POA: Diagnosis not present

## 2021-06-15 DIAGNOSIS — R600 Localized edema: Secondary | ICD-10-CM

## 2021-06-15 DIAGNOSIS — M25661 Stiffness of right knee, not elsewhere classified: Secondary | ICD-10-CM

## 2021-06-15 DIAGNOSIS — M25561 Pain in right knee: Secondary | ICD-10-CM | POA: Diagnosis not present

## 2021-06-15 DIAGNOSIS — R262 Difficulty in walking, not elsewhere classified: Secondary | ICD-10-CM | POA: Diagnosis not present

## 2021-06-15 NOTE — Therapy (Signed)
[facility] Physical Therapy [address] [place], [place], [address] Phone: [phone]   Fax:  [phone]  Physical Therapy Treatment  Patient Details  Name: Rebecca Guzman MRN: [id number] Date of Birth: [birth year] Referring Provider (PT): Marybelle Killings, MD   Encounter Date: 06/15/2021   PT End of Session - 06/15/21 1517     Visit Number 2    Number of Visits 12    Date for PT Re-Evaluation 07/20/21    Authorization Type [employer]    PT Start Time 6222    PT Stop Time 1600    PT Time Calculation (min) 43 min    Activity Tolerance Patient tolerated treatment well    Behavior During Therapy [facility] for tasks assessed/performed             Past Medical History:  Diagnosis Date   Asthma    BPPV (benign paroxysmal positional vertigo)    Breast cancer ([place])    left mastectomy 1992, right lumpectomy 2019   Cancer ([place])    Compression fracture of L3 lumbar vertebra 04/06/2015   Dysrhythmia 2019   pt states Cardiologist told her it is "pre A-fib"   Family history of breast cancer    Family history of colon cancer    Family history of pancreatic cancer    GERD (gastroesophageal reflux disease)    Hiatal hernia    pt states she doesnt have this   History of adenomatous polyp of colon    tubular adenoma 2005 and  2014 tubular adenoma and  hyperplastic polyp   History of breast cancer per pt no recurrence   dx 1992 --  s/p  left breast mastectomy and chemotherapy   Hyperlipidemia LDL goal <100    Osteoarthritis of right knee    Personal history of chemotherapy 1992   PONV (postoperative nausea and vomiting)    and "gets weak and light-leaded"   Sensation of pressure in bladder area    Vaginal vault prolapse    anterior   Wears glasses    Wears hearing aid    left only    Past Surgical History:  Procedure Laterality Date   BREAST BIOPSY Right 04/19/2018   BREAST LUMPECTOMY Right 05/17/2018   BREAST LUMPECTOMY WITH RADIOACTIVE SEED LOCALIZATION  Right 05/17/2018   Procedure: RIGHT BREAST LUMPECTOMY WITH RADIOACTIVE SEED LOCALIZATION;  Surgeon: [name], MD;  Location: [place];  Service: General;  Laterality: Right;   CATARACT EXTRACTION W/ INTRAOCULAR LENS  IMPLANT, BILATERAL  2014   CHOLECYSTECTOMY  1985   COLONOSCOPY  last one 06-04-2013   CYSTOCELE REPAIR N/A 02/29/2016   Procedure: ANTERIOR VAULT PROLAPSE REPAIR COLOPLAST [place]  ;  Surgeon: Carolan Clines, MD;  Location: [place];  Service: Urology;  Laterality: N/A;   ESOPHAGOGASTRODUODENOSCOPY  08-31-2009   LAPAROSCOPIC ASSISTED VAGINAL HYSTERECTOMY  01-10-2000   w/ Bilateral Salpingoophorectomy /  Anterior & Posterior repair/  Pubovaginal Sling   MASTECTOMY Left 1992   TOTAL KNEE ARTHROPLASTY Right 05/21/2021   Procedure: RIGHT TOTAL KNEE ARTHROPLASTY;  Surgeon: Marybelle Killings, MD;  Location: [place];  Service: Orthopedics;  Laterality: Right;    There were no vitals filed for this visit.   Subjective Assessment - 06/15/21 1520     Subjective Pt states she's been doing her exercises twice a day. Pt notes increased soreness from doing them.    Pertinent History PMH: vertigo,lumbar DDD,osteoporosis, breast ca treated with chemo and mastectomy  How long can you stand comfortably? 15 minutes    Patient Stated Goals walking in neighborhood, carrying in groceries, housekeeping    Pain Onset More than a month ago                               [facility] Adult PT Treatment/Exercise - 06/15/21 0001       Ambulation/Gait   Ambulation Distance (Feet) 80 Feet    Assistive device None    Gait Pattern Step-through pattern    Gait Comments side stepping 2x10'; backwards walking 2x25' no a/d      Knee/Hip Exercises: Stretches   Sports administrator 2 reps;30 seconds;Right;Left    Press photographer Right;Left;30 seconds;2 reps    Other Knee/Hip Stretches adductor seated x30 sec    Other Knee/Hip Stretches  figure 4 stretch x30 sec each side      Knee/Hip Exercises: Aerobic   Nustep L4 x 6 min LE/UEs      Knee/Hip Exercises: Standing   Heel Raises 2 sets;10 reps    Other Standing Knee Exercises slow marching x10    Other Standing Knee Exercises feet together head turns x10, head nods x10. Standing on airex feet together head nods & head turns x10 each      Knee/Hip Exercises: Supine   Heel Slides AAROM;Right;10 reps                     PT Education - 06/15/21 1521     Education Details Discussed what to do if she's too sore. Went over exercises she could perform safely without exacerbating her soreness.    Person(s) Educated Patient    Methods Explanation;Demonstration;Tactile cues;Verbal cues;Handout    Comprehension Verbalized understanding;Returned demonstration;Verbal cues required              PT Short Term Goals - 06/08/21 1629       PT SHORT TERM GOAL #1   Title independent with HEP    Time 4    Period Weeks    Status New    Target Date 07/06/21      PT SHORT TERM GOAL #2   Title perform 5 times sit to stand less than 16 seconds    Time 4    Period Weeks    Status New    Target Date 07/06/21               PT Long Term Goals - 06/08/21 1630       PT LONG TERM GOAL #1   Title Pt will improve FOTO score to 52% functional    Time 6    Period Weeks    Status New    Target Date 07/20/21      PT LONG TERM GOAL #2   Title report pain < 3/10 with activity for improved mobility    Time 6    Period Weeks    Status New      PT LONG TERM GOAL #3   Title She will improve Rt knee PROM 0-120 deg to improve function    Time 6    Period Weeks    Status New      PT LONG TERM GOAL #4   Title Pt will improve Rt knee strength to 5/5 MMT to improve functional strength    Time 6    Period Weeks    Status New      PT LONG TERM  GOAL #5   Title Pt will improve 5 times sit to stand test and TUG test to 13 seconds or less without AD to show improved  balance and gait speed    Time 6    Period Weeks    Status New                   Plan - 06/15/21 1524     Clinical Impression Statement Pt with increased soreness -- focused on gentle stretching, AROM, gait and balance exercises. Provided pt HEP to perform for her soreness and discussed that she continues her strengthening exercises. Pt continues to have R LE edema into her ankles/calves    Personal Factors and Comorbidities Comorbidity 3+    Comorbidities PMH: vertigo,lumbar DDD,osteoporosis, breast ca treated with chemo and mastectomy,    Examination-Activity Limitations Bend;Lift;Stand;Stairs;Squat;Sleep;Locomotion Level    Examination-Participation Restrictions Cleaning;Driving;Community Activity;Laundry;Shop    Stability/Clinical Decision Making Evolving/Moderate complexity    Rehab Potential Good    PT Frequency 2x / week    PT Duration 6 weeks    PT Treatment/Interventions Cryotherapy;Electrical Stimulation;Traction;Ultrasound;Iontophoresis 4mg /ml Dexamethasone;Moist Heat;Gait training;Therapeutic activities;Therapeutic exercise;Neuromuscular re-education;Stair training;Patient/family education;Manual techniques;Joint Manipulations;Dry needling;Passive range of motion;Taping;Vasopneumatic Device    PT Next Visit Plan gait with SPC progressing to no AD, Rt knee ROM and strength    PT Home Exercise Plan Access Code: [email or web address]    Consulted and Agree with Plan of Care Patient             Patient will benefit from skilled therapeutic intervention in order to improve the following deficits and impairments:  Decreased activity tolerance, Decreased balance, Decreased endurance, Decreased mobility, Decreased range of motion, Decreased strength, Difficulty walking, Increased edema, Impaired flexibility, Pain  Visit Diagnosis: Acute pain of right knee  Stiffness of right knee, not elsewhere classified  Muscle weakness (generalized)  Difficulty in walking, not elsewhere  classified  Localized edema     Problem List Patient Active Problem List   Diagnosis Date Noted   S/P total knee arthroplasty, right 05/21/2021   Genetic testing 06/07/2018   History of breast cancer 04/19/2018   Wears hearing aid    Wears glasses    Vaginal vault prolapse    Hiatal hernia    BPPV (benign paroxysmal positional vertigo)    Palpitations 10/31/2016   Radicular pain of right lower back 04/06/2015   Compression fracture of L3 lumbar vertebra 04/06/2015   Senile osteoporosis 04/06/2015   DDD (degenerative disc disease), lumbar 04/06/2015   Other malaise and fatigue 01/17/2014   Hx of adenomatous colonic polyps 07/11/2013   Hyperlipidemia LDL goal <100    Bladder prolapse, female, acquired    Esophageal reflux 10/12/2011    [facility] April Gordy Levan, PT, DPT 06/15/2021, 4:08 PM  [facility] Physical Therapy [address] [place], [place], [address] Phone: [phone]   Fax:  [phone]  Name: Rebecca Guzman MRN: [id number] Date of Birth: May 19, 1934

## 2021-06-17 ENCOUNTER — Encounter: Payer: Self-pay | Admitting: Rehabilitative and Restorative Service Providers"

## 2021-06-17 ENCOUNTER — Other Ambulatory Visit: Payer: Self-pay

## 2021-06-17 ENCOUNTER — Ambulatory Visit: Payer: Medicare PPO | Admitting: Rehabilitative and Restorative Service Providers"

## 2021-06-17 DIAGNOSIS — M25661 Stiffness of right knee, not elsewhere classified: Secondary | ICD-10-CM

## 2021-06-17 DIAGNOSIS — R262 Difficulty in walking, not elsewhere classified: Secondary | ICD-10-CM

## 2021-06-17 DIAGNOSIS — M25561 Pain in right knee: Secondary | ICD-10-CM

## 2021-06-17 DIAGNOSIS — R600 Localized edema: Secondary | ICD-10-CM

## 2021-06-17 DIAGNOSIS — M6281 Muscle weakness (generalized): Secondary | ICD-10-CM

## 2021-06-17 NOTE — Therapy (Signed)
[facility] Physical Therapy [address] [place], [place], [address] Phone: [phone]   Fax:  [phone]  Physical Therapy Treatment  Patient Details  Name: Rebecca Guzman MRN: [id number] Date of Birth: [birth year] Referring Provider (PT): Marybelle Killings, MD   Encounter Date: 06/17/2021   PT End of Session - 06/17/21 1617     Visit Number 3    Number of Visits 12    Date for PT Re-Evaluation 07/20/21    Authorization Type [employer]    PT Start Time 7001    PT Stop Time 1610    PT Time Calculation (min) 58 min    Activity Tolerance Patient tolerated treatment well;No increased pain    Behavior During Therapy WFL for tasks assessed/performed             Past Medical History:  Diagnosis Date   Asthma    BPPV (benign paroxysmal positional vertigo)    Breast cancer ([place])    left mastectomy 1992, right lumpectomy 2019   Cancer ([place])    Compression fracture of L3 lumbar vertebra 04/06/2015   Dysrhythmia 2019   pt states Cardiologist told her it is "pre A-fib"   Family history of breast cancer    Family history of colon cancer    Family history of pancreatic cancer    GERD (gastroesophageal reflux disease)    Hiatal hernia    pt states she doesnt have this   History of adenomatous polyp of colon    tubular adenoma 2005 and  2014 tubular adenoma and  hyperplastic polyp   History of breast cancer per pt no recurrence   dx 1992 --  s/p  left breast mastectomy and chemotherapy   Hyperlipidemia LDL goal <100    Osteoarthritis of right knee    Personal history of chemotherapy 1992   PONV (postoperative nausea and vomiting)    and "gets weak and light-leaded"   Sensation of pressure in bladder area    Vaginal vault prolapse    anterior   Wears glasses    Wears hearing aid    left only    Past Surgical History:  Procedure Laterality Date   BREAST BIOPSY Right 04/19/2018   BREAST LUMPECTOMY Right 05/17/2018   BREAST LUMPECTOMY WITH RADIOACTIVE  SEED LOCALIZATION Right 05/17/2018   Procedure: RIGHT BREAST LUMPECTOMY WITH RADIOACTIVE SEED LOCALIZATION;  Surgeon: [name], MD;  Location: [place];  Service: General;  Laterality: Right;   CATARACT EXTRACTION W/ INTRAOCULAR LENS  IMPLANT, BILATERAL  2014   CHOLECYSTECTOMY  1985   COLONOSCOPY  last one 06-04-2013   CYSTOCELE REPAIR N/A 02/29/2016   Procedure: ANTERIOR VAULT PROLAPSE REPAIR COLOPLAST [place]  ;  Surgeon: Carolan Clines, MD;  Location: [place];  Service: Urology;  Laterality: N/A;   ESOPHAGOGASTRODUODENOSCOPY  08-31-2009   LAPAROSCOPIC ASSISTED VAGINAL HYSTERECTOMY  01-10-2000   w/ Bilateral Salpingoophorectomy /  Anterior & Posterior repair/  Pubovaginal Sling   MASTECTOMY Left 1992   TOTAL KNEE ARTHROPLASTY Right 05/21/2021   Procedure: RIGHT TOTAL KNEE ARTHROPLASTY;  Surgeon: Marybelle Killings, MD;  Location: [place];  Service: Orthopedics;  Laterality: Right;    There were no vitals filed for this visit.   Subjective Assessment - 06/17/21 1519     Subjective Rebecca Guzman is doing a great job with her HEP.  1/2 an oxy before bed and ice for pain management.    Pertinent History PMH: vertigo,lumbar DDD,osteoporosis, breast ca treated with chemo  and mastectomy    How long can you stand comfortably? 15 minutes    Patient Stated Goals walking in neighborhood, carrying in groceries, housekeeping    Currently in Pain? Yes    Pain Score 2     Pain Location Knee    Pain Orientation Right    Pain Descriptors / Indicators Aching    Pain Type Surgical pain    Pain Radiating Towards NA    Pain Onset More than a month ago    Pain Frequency Intermittent    Aggravating Factors  Prolonged postures (increased WB causes edema, prolonged sitting and lying down increases stiffness)    Pain Relieving Factors Ice and exercises    Effect of Pain on Daily Activities Uses cane with ambulation    Multiple Pain Sites No                 [facility] PT Assessment - 06/17/21 0001       PROM   PROM Assessment Site Knee    Right/Left Knee Right    Right Knee Extension -2    Right Knee Flexion 113                           [facility] Adult PT Treatment/Exercise - 06/17/21 0001       Therapeutic Activites    Therapeutic Activities ADL's    ADL's Step up and over 4 inch step (no hands and slow eccentrics)      Neuro Re-ed    Neuro Re-ed Details  Tandem balance 5X 20 seconds and single leg stance 3X 10 seconds each side      Exercises   Exercises Knee/Hip      Knee/Hip Exercises: Stretches   Other Knee/Hip Stretches Supine R knee flexion AAROM with belt (L side helps) 10X 5 seconds      Knee/Hip Exercises: Aerobic   Nustep Level 6 for 8 minutes (4 minutes extension emphasis and 4 minutes slid seat up for flexion emphasis)      Knee/Hip Exercises: Machines for Strengthening   Total Gym Leg Press 100# Double leg 10X slow eccentrics (full extension to full flexion) and 50# single leg 10X same parameters      Knee/Hip Exercises: Supine   Quad Sets Strengthening;Both;10 reps;Limitations    Quad Sets Limitations Toes back, press knees down and tighten thighs    Heel Slides AAROM;Right;10 reps      Modalities   Modalities Vasopneumatic      Vasopneumatic   Number Minutes Vasopneumatic  10 minutes    Vasopnuematic Location  Knee    Vasopneumatic Pressure Medium    Vasopneumatic Temperature  34*                     PT Education - 06/17/21 1616     Education Details Reviewed HEP with emphasis on 4 activities above.  Encouraged ice with elevation to combat edema.    Person(s) Educated Patient    Methods Explanation;Demonstration;Tactile cues;Verbal cues;Handout    Comprehension Verbalized understanding;Tactile cues required;Returned demonstration;Need further instruction;Verbal cues required              PT Short Term Goals - 06/17/21 1617       PT SHORT TERM GOAL #1   Title  independent with HEP    Time 4    Period Weeks    Status Achieved    Target Date 07/06/21      PT  SHORT TERM GOAL #2   Title perform 5 times sit to stand less than 16 seconds    Time 4    Period Weeks    Status On-going    Target Date 07/06/21               PT Long Term Goals - 06/08/21 1630       PT LONG TERM GOAL #1   Title Pt will improve FOTO score to 52% functional    Time 6    Period Weeks    Status New    Target Date 07/20/21      PT LONG TERM GOAL #2   Title report pain < 3/10 with activity for improved mobility    Time 6    Period Weeks    Status New      PT LONG TERM GOAL #3   Title She will improve Rt knee PROM 0-120 deg to improve function    Time 6    Period Weeks    Status New      PT LONG TERM GOAL #4   Title Pt will improve Rt knee strength to 5/5 MMT to improve functional strength    Time 6    Period Weeks    Status New      PT LONG TERM GOAL #5   Title Pt will improve 5 times sit to stand test and TUG test to 13 seconds or less without AD to show improved balance and gait speed    Time 6    Period Weeks    Status New                   Plan - 06/17/21 1618     Clinical Impression Statement Mitali is doing a great job with her HEP.  Only edema limits extension AROM and passive (with a belt and self-overpressure) flexion is 113 degrees.  Today's visit focused on quadriceps strength, balance and function.  Her prognosis to meet all long-term goals is good with continued work.    Personal Factors and Comorbidities Comorbidity 3+    Comorbidities PMH: vertigo,lumbar DDD,osteoporosis, breast ca treated with chemo and mastectomy,    Examination-Activity Limitations Bend;Lift;Stand;Stairs;Squat;Sleep;Locomotion Level    Examination-Participation Restrictions Cleaning;Driving;Community Activity;Laundry;Shop    Stability/Clinical Decision Making Evolving/Moderate complexity    Rehab Potential Good    PT Frequency 2x / week    PT  Duration 6 weeks    PT Treatment/Interventions Cryotherapy;Electrical Stimulation;Traction;Ultrasound;Iontophoresis 4mg /ml Dexamethasone;Moist Heat;Gait training;Therapeutic activities;Therapeutic exercise;Neuromuscular re-education;Stair training;Patient/family education;Manual techniques;Joint Manipulations;Dry needling;Passive range of motion;Taping;Vasopneumatic Device    PT Next Visit Plan Quad and hip abductors strength, balance, stairs, edema control    PT Home Exercise Plan Access Code: [email or web address]    Consulted and Agree with Plan of Care Patient             Patient will benefit from skilled therapeutic intervention in order to improve the following deficits and impairments:  Decreased activity tolerance, Decreased balance, Decreased endurance, Decreased mobility, Decreased range of motion, Decreased strength, Difficulty walking, Increased edema, Impaired flexibility, Pain  Visit Diagnosis: Muscle weakness (generalized)  Difficulty in walking, not elsewhere classified  Stiffness of right knee, not elsewhere classified  Acute pain of right knee  Localized edema     Problem List Patient Active Problem List   Diagnosis Date Noted   S/P total knee arthroplasty, right 05/21/2021   Genetic testing 06/07/2018   History of breast cancer 04/19/2018   Wears hearing aid  Wears glasses    Vaginal vault prolapse    Hiatal hernia    BPPV (benign paroxysmal positional vertigo)    Palpitations 10/31/2016   Radicular pain of right lower back 04/06/2015   Compression fracture of L3 lumbar vertebra 04/06/2015   Senile osteoporosis 04/06/2015   DDD (degenerative disc disease), lumbar 04/06/2015   Other malaise and fatigue 01/17/2014   Hx of adenomatous colonic polyps 07/11/2013   Hyperlipidemia LDL goal <100    Bladder prolapse, female, acquired    Esophageal reflux 10/12/2011    Farley Ly, PT, MPT 06/17/2021, 4:20 PM  [facility] Physical Therapy [address] [place], [place], [address] Phone: [phone]   Fax:  [phone]  Name: Rebecca Guzman MRN: [id number] Date of Birth: [birth year]

## 2021-06-17 NOTE — Patient Instructions (Signed)
Access Code: [email or web address] URL: [email or web address] Date: 06/17/2021 Prepared by: Vista Mink  Exercises  Supine Heel Slide with Strap - 2 x daily - 6 x weekly - 1-2 sets - 10 reps - 5 hold Supine Quadricep Sets - 3-5 x daily - 7 x weekly - 1-3 sets - 10 reps - 5 second hold Tandem Stance - 2 x daily - 7 x weekly - 1 sets - 5 reps - 20 second hold Standing Lumbar Extension at [place] - 5 x daily - 7 x weekly - 1 sets - 5 reps - 3 seconds hold

## 2021-06-21 ENCOUNTER — Ambulatory Visit: Payer: Medicare PPO | Admitting: Physical Therapy

## 2021-06-21 ENCOUNTER — Other Ambulatory Visit: Payer: Self-pay

## 2021-06-21 DIAGNOSIS — M6281 Muscle weakness (generalized): Secondary | ICD-10-CM | POA: Diagnosis not present

## 2021-06-21 DIAGNOSIS — R262 Difficulty in walking, not elsewhere classified: Secondary | ICD-10-CM

## 2021-06-21 DIAGNOSIS — M25661 Stiffness of right knee, not elsewhere classified: Secondary | ICD-10-CM | POA: Diagnosis not present

## 2021-06-21 DIAGNOSIS — R600 Localized edema: Secondary | ICD-10-CM

## 2021-06-21 DIAGNOSIS — M25561 Pain in right knee: Secondary | ICD-10-CM | POA: Diagnosis not present

## 2021-06-21 NOTE — Therapy (Signed)
[facility] Physical Therapy [address] [place], [place], [address] Phone: [phone]   Fax:  [phone]  Physical Therapy Treatment  Patient Details  Name: Rebecca Guzman MRN: [id number] Date of Birth: [birth year] Referring Provider (PT): Marybelle Killings, MD   Encounter Date: 06/21/2021   PT End of Session - 06/21/21 1510     Visit Number 4    Number of Visits 12    Date for PT Re-Evaluation 07/20/21    Authorization Type [employer]    PT Start Time 6433    PT Stop Time 1524    PT Time Calculation (min) 50 min    Activity Tolerance Patient tolerated treatment well;No increased pain    Behavior During Therapy WFL for tasks assessed/performed             Past Medical History:  Diagnosis Date   Asthma    BPPV (benign paroxysmal positional vertigo)    Breast cancer ([place])    left mastectomy 1992, right lumpectomy 2019   Cancer ([place])    Compression fracture of L3 lumbar vertebra 04/06/2015   Dysrhythmia 2019   pt states Cardiologist told her it is "pre A-fib"   Family history of breast cancer    Family history of colon cancer    Family history of pancreatic cancer    GERD (gastroesophageal reflux disease)    Hiatal hernia    pt states she doesnt have this   History of adenomatous polyp of colon    tubular adenoma 2005 and  2014 tubular adenoma and  hyperplastic polyp   History of breast cancer per pt no recurrence   dx 1992 --  s/p  left breast mastectomy and chemotherapy   Hyperlipidemia LDL goal <100    Osteoarthritis of right knee    Personal history of chemotherapy 1992   PONV (postoperative nausea and vomiting)    and "gets weak and light-leaded"   Sensation of pressure in bladder area    Vaginal vault prolapse    anterior   Wears glasses    Wears hearing aid    left only    Past Surgical History:  Procedure Laterality Date   BREAST BIOPSY Right 04/19/2018   BREAST LUMPECTOMY Right 05/17/2018   BREAST LUMPECTOMY WITH RADIOACTIVE  SEED LOCALIZATION Right 05/17/2018   Procedure: RIGHT BREAST LUMPECTOMY WITH RADIOACTIVE SEED LOCALIZATION;  Surgeon: [name], MD;  Location: [place];  Service: General;  Laterality: Right;   CATARACT EXTRACTION W/ INTRAOCULAR LENS  IMPLANT, BILATERAL  2014   CHOLECYSTECTOMY  1985   COLONOSCOPY  last one 06-04-2013   CYSTOCELE REPAIR N/A 02/29/2016   Procedure: ANTERIOR VAULT PROLAPSE REPAIR COLOPLAST [place]  ;  Surgeon: Carolan Clines, MD;  Location: [place];  Service: Urology;  Laterality: N/A;   ESOPHAGOGASTRODUODENOSCOPY  08-31-2009   LAPAROSCOPIC ASSISTED VAGINAL HYSTERECTOMY  01-10-2000   w/ Bilateral Salpingoophorectomy /  Anterior & Posterior repair/  Pubovaginal Sling   MASTECTOMY Left 1992   TOTAL KNEE ARTHROPLASTY Right 05/21/2021   Procedure: RIGHT TOTAL KNEE ARTHROPLASTY;  Surgeon: Marybelle Killings, MD;  Location: [place];  Service: Orthopedics;  Laterality: Right;    There were no vitals filed for this visit.   Subjective Assessment - 06/21/21 1450     Subjective She relays her knee is doing good, no pain upon arrival, she has been doing HEP.    Pertinent History PMH: vertigo,lumbar DDD,osteoporosis, breast ca treated with chemo and mastectomy  How long can you stand comfortably? 15 minutes    Patient Stated Goals walking in neighborhood, carrying in groceries, housekeeping    Pain Onset More than a month ago                [facility] Adult PT Treatment/Exercise - 06/21/21 0001       Knee/Hip Exercises: Stretches   Active Hamstring Stretch Right;3 reps;20 seconds    Active Hamstring Stretch Limitations supine with strap    Quad Stretch Right;3 reps;30 seconds    Gastroc Stretch Both;3 reps;30 seconds    Other Knee/Hip Stretches Supine R knee flexion AAROM with belt (L side helps) 10X 5 seconds      Knee/Hip Exercises: Aerobic   Nustep Level 5 for 10 minutes      Knee/Hip Exercises: Machines for  Strengthening   Total Gym Leg Press 106# Double leg 15X slow eccentrics (full extension to full flexion) and 50# single leg 15X      Knee/Hip Exercises: Standing   Heel Raises Both;20 reps    Lateral Step Up Both;10 reps;Hand Hold: 2;Step Height: 6"    Forward Step Up Right;10 reps;Hand Hold: 1;Step Height: 6"      Knee/Hip Exercises: Seated   Long Arc Quad Right;2 sets;15 reps    Long Arc Quad Weight 4 lbs.      Vasopneumatic   Number Minutes Vasopneumatic  10 minutes    Vasopnuematic Location  Knee    Vasopneumatic Pressure Medium    Vasopneumatic Temperature  34*                       PT Short Term Goals - 06/17/21 1617       PT SHORT TERM GOAL #1   Title independent with HEP    Time 4    Period Weeks    Status Achieved    Target Date 07/06/21      PT SHORT TERM GOAL #2   Title perform 5 times sit to stand less than 16 seconds    Time 4    Period Weeks    Status On-going    Target Date 07/06/21               PT Long Term Goals - 06/08/21 1630       PT LONG TERM GOAL #1   Title Pt will improve FOTO score to 52% functional    Time 6    Period Weeks    Status New    Target Date 07/20/21      PT LONG TERM GOAL #2   Title report pain < 3/10 with activity for improved mobility    Time 6    Period Weeks    Status New      PT LONG TERM GOAL #3   Title She will improve Rt knee PROM 0-120 deg to improve function    Time 6    Period Weeks    Status New      PT LONG TERM GOAL #4   Title Pt will improve Rt knee strength to 5/5 MMT to improve functional strength    Time 6    Period Weeks    Status New      PT LONG TERM GOAL #5   Title Pt will improve 5 times sit to stand test and TUG test to 13 seconds or less without AD to show improved balance and gait speed    Time 6  Period Weeks    Status New                   Plan - 06/21/21 1515     Clinical Impression Statement She is overall doing well with her Rt knee strength  and ROM progress. She does still have some swelling and requests vaso to help with this. Continue POC.    Personal Factors and Comorbidities Comorbidity 3+    Comorbidities PMH: vertigo,lumbar DDD,osteoporosis, breast ca treated with chemo and mastectomy,    Examination-Activity Limitations Bend;Lift;Stand;Stairs;Squat;Sleep;Locomotion Level    Examination-Participation Restrictions Cleaning;Driving;Community Activity;Laundry;Shop    Stability/Clinical Decision Making Evolving/Moderate complexity    Rehab Potential Good    PT Frequency 2x / week    PT Duration 6 weeks    PT Treatment/Interventions Cryotherapy;Electrical Stimulation;Traction;Ultrasound;Iontophoresis 4mg /ml Dexamethasone;Moist Heat;Gait training;Therapeutic activities;Therapeutic exercise;Neuromuscular re-education;Stair training;Patient/family education;Manual techniques;Joint Manipulations;Dry needling;Passive range of motion;Taping;Vasopneumatic Device    PT Next Visit Plan Quad and hip abductors strength, balance, stairs, edema control    PT Home Exercise Plan Access Code: [email or web address]    Consulted and Agree with Plan of Care Patient             Patient will benefit from skilled therapeutic intervention in order to improve the following deficits and impairments:  Decreased activity tolerance, Decreased balance, Decreased endurance, Decreased mobility, Decreased range of motion, Decreased strength, Difficulty walking, Increased edema, Impaired flexibility, Pain  Visit Diagnosis: Muscle weakness (generalized)  Difficulty in walking, not elsewhere classified  Stiffness of right knee, not elsewhere classified  Acute pain of right knee  Localized edema     Problem List Patient Active Problem List   Diagnosis Date Noted   S/P total knee arthroplasty, right 05/21/2021   Genetic testing 06/07/2018   History of breast cancer 04/19/2018   Wears hearing aid    Wears glasses    Vaginal vault prolapse    Hiatal hernia     BPPV (benign paroxysmal positional vertigo)    Palpitations 10/31/2016   Radicular pain of right lower back 04/06/2015   Compression fracture of L3 lumbar vertebra 04/06/2015   Senile osteoporosis 04/06/2015   DDD (degenerative disc disease), lumbar 04/06/2015   Other malaise and fatigue 01/17/2014   Hx of adenomatous colonic polyps 07/11/2013   Hyperlipidemia LDL goal <100    Bladder prolapse, female, acquired    Esophageal reflux 10/12/2011    Debbe Odea, PT,DPT 06/21/2021, 3:16 PM  [facility] Physical Therapy [address] [place], [place], [address] Phone: [phone]   Fax:  [phone]  Name: VIVIKA POYTHRESS MRN: [id number] Date of Birth: 1933-11-08

## 2021-06-23 ENCOUNTER — Ambulatory Visit: Payer: Medicare PPO | Admitting: Physical Therapy

## 2021-06-23 ENCOUNTER — Other Ambulatory Visit: Payer: Self-pay

## 2021-06-23 DIAGNOSIS — M25661 Stiffness of right knee, not elsewhere classified: Secondary | ICD-10-CM | POA: Diagnosis not present

## 2021-06-23 DIAGNOSIS — R600 Localized edema: Secondary | ICD-10-CM | POA: Diagnosis not present

## 2021-06-23 DIAGNOSIS — M25561 Pain in right knee: Secondary | ICD-10-CM

## 2021-06-23 DIAGNOSIS — M6281 Muscle weakness (generalized): Secondary | ICD-10-CM | POA: Diagnosis not present

## 2021-06-23 DIAGNOSIS — R262 Difficulty in walking, not elsewhere classified: Secondary | ICD-10-CM

## 2021-06-23 NOTE — Therapy (Signed)
[facility] [address] [place], [place], [address] Phone: [phone]   Fax:  [phone]  Physical Therapy Treatment  Patient Details  Name: Rebecca Guzman MRN: [id number] Date of Birth: 09-Feb-1934 Referring Provider (PT): Marybelle Killings, MD   Encounter Date: 06/23/2021   PT End of Session - 06/23/21 1522     Visit Number 5    Number of Visits 12    Date for PT Re-Evaluation 07/20/21    Authorization Type [employer]    PT Start Time 3810    PT Stop Time 1600    PT Time Calculation (min) 53 min    Activity Tolerance Patient tolerated treatment well;No increased pain    Behavior During Therapy WFL for tasks assessed/performed             Past Medical History:  Diagnosis Date   Asthma    BPPV (benign paroxysmal positional vertigo)    Breast cancer ([place])    left mastectomy 1992, right lumpectomy 2019   Cancer ([place])    Compression fracture of L3 lumbar vertebra 04/06/2015   Dysrhythmia 2019   pt states Cardiologist told her it is "pre A-fib"   Family history of breast cancer    Family history of colon cancer    Family history of pancreatic cancer    GERD (gastroesophageal reflux disease)    Hiatal hernia    pt states she doesnt have this   History of adenomatous polyp of colon    tubular adenoma 2005 and  2014 tubular adenoma and  hyperplastic polyp   History of breast cancer per pt no recurrence   dx 1992 --  s/p  left breast mastectomy and chemotherapy   Hyperlipidemia LDL goal <100    Osteoarthritis of right knee    Personal history of chemotherapy 1992   PONV (postoperative nausea and vomiting)    and "gets weak and light-leaded"   Sensation of pressure in bladder area    Vaginal vault prolapse    anterior   Wears glasses    Wears hearing aid    left only    Past Surgical History:  Procedure Laterality Date   BREAST BIOPSY Right 04/19/2018   BREAST LUMPECTOMY Right 05/17/2018   BREAST LUMPECTOMY WITH RADIOACTIVE  SEED LOCALIZATION Right 05/17/2018   Procedure: RIGHT BREAST LUMPECTOMY WITH RADIOACTIVE SEED LOCALIZATION;  Surgeon: [name], MD;  Location: [place];  Service: General;  Laterality: Right;   CATARACT EXTRACTION W/ INTRAOCULAR LENS  IMPLANT, BILATERAL  2014   CHOLECYSTECTOMY  1985   COLONOSCOPY  last one 06-04-2013   CYSTOCELE REPAIR N/A 02/29/2016   Procedure: ANTERIOR VAULT PROLAPSE REPAIR COLOPLAST [place]  ;  Surgeon: Carolan Clines, MD;  Location: [place];  Service: Urology;  Laterality: N/A;   ESOPHAGOGASTRODUODENOSCOPY  08-31-2009   LAPAROSCOPIC ASSISTED VAGINAL HYSTERECTOMY  01-10-2000   w/ Bilateral Salpingoophorectomy /  Anterior & Posterior repair/  Pubovaginal Sling   MASTECTOMY Left 1992   TOTAL KNEE ARTHROPLASTY Right 05/21/2021   Procedure: RIGHT TOTAL KNEE ARTHROPLASTY;  Surgeon: Marybelle Killings, MD;  Location: [place];  Service: Orthopedics;  Laterality: Right;    There were no vitals filed for this visit.   Subjective Assessment - 06/23/21 1513     Subjective She relays her knee is getting better each week, denies pain upon arrival.    Pertinent History PMH: vertigo,lumbar DDD,osteoporosis, breast ca treated with chemo and mastectomy    How long  can you stand comfortably? 15 minutes    Patient Stated Goals walking in neighborhood, carrying in groceries, housekeeping    Pain Onset More than a month ago                [facility] PT Assessment - 06/23/21 0001       Assessment   Medical Diagnosis Rt knee TKA    Referring Provider (PT) Marybelle Killings, MD    Onset Date/Surgical Date 05/21/21      AROM   Right Knee Extension -3    Right Knee Flexion 114      PROM   Right Knee Extension 0    Right Knee Flexion 120      Strength   Overall Strength Comments 4+ to 5 overall hip/knee strength on Rt              [facility] Adult PT Treatment/Exercise - 06/23/21 0001       Neuro Re-ed    Neuro Re-ed  Details  march walking, retro walking, walking with head turns and head nods      Knee/Hip Exercises: Stretches   Gastroc Stretch Both;3 reps;30 seconds    Gastroc Stretch Limitations slantboard      Knee/Hip Exercises: Aerobic   Recumbent Bike rocking first 3 min then full revolutions last 5 minutes      Knee/Hip Exercises: Standing   Lateral Step Up Right;15 reps;Hand Hold: 1;Step Height: 6"    Forward Step Up Right;15 reps;Hand Hold: 1;Step Height: 6"      Knee/Hip Exercises: Seated   Long Arc Quad Right;2 sets;15 reps    Long Arc Quad Weight 5 lbs.    Sit to Sand 2 sets;10 reps;without UE support                       PT Short Term Goals - 06/17/21 1617       PT SHORT TERM GOAL #1   Title independent with HEP    Time 4    Period Weeks    Status Achieved    Target Date 07/06/21      PT SHORT TERM GOAL #2   Title perform 5 times sit to stand less than 16 seconds    Time 4    Period Weeks    Status On-going    Target Date 07/06/21               PT Long Term Goals - 06/23/21 1555       PT LONG TERM GOAL #1   Title Pt will improve FOTO score to 52% functional    Time 6    Period Weeks    Status On-going      PT LONG TERM GOAL #2   Title report pain < 3/10 with activity for improved mobility    Time 6    Period Weeks    Status On-going      PT LONG TERM GOAL #3   Title She will improve Rt knee PROM 0-120 deg to improve function    Time 6    Period Weeks    Status New      PT LONG TERM GOAL #4   Title Pt will improve Rt knee strength to 5/5 MMT to improve functional strength    Time 6    Period Weeks    Status On-going      PT LONG TERM GOAL #5   Title Pt will improve 5 times  sit to stand test and TUG test to 13 seconds or less without AD to show improved balance and gait speed    Time 6    Period Weeks    Status New                   Plan - 06/23/21 1553     Clinical Impression Statement She has now met her Rt knee  flexion ROM goal and is very close to extension goal. Her strength has progressed very well and I had her perform more dyanamic gait challenges as I feel she can discontinue using her cane. She will follow up with MD at the end of the week.    Personal Factors and Comorbidities Comorbidity 3+    Comorbidities PMH: vertigo,lumbar DDD,osteoporosis, breast ca treated with chemo and mastectomy,    Examination-Activity Limitations Bend;Lift;Stand;Stairs;Squat;Sleep;Locomotion Level    Examination-Participation Restrictions Cleaning;Driving;Community Activity;Laundry;Shop    Stability/Clinical Decision Making Evolving/Moderate complexity    Rehab Potential Good    PT Frequency 2x / week    PT Duration 6 weeks    PT Treatment/Interventions Cryotherapy;Electrical Stimulation;Traction;Ultrasound;Iontophoresis 57m/ml Dexamethasone;Moist Heat;Gait training;Therapeutic activities;Therapeutic exercise;Neuromuscular re-education;Stair training;Patient/family education;Manual techniques;Joint Manipulations;Dry needling;Passive range of motion;Taping;Vasopneumatic Device    PT Next Visit Plan what did MD say? Quad and hip abductors strength, balance, stairs, edema control    PT Home Exercise Plan Access Code: [email or web address]    Consulted and Agree with Plan of Care Patient             Patient will benefit from skilled therapeutic intervention in order to improve the following deficits and impairments:  Decreased activity tolerance, Decreased balance, Decreased endurance, Decreased mobility, Decreased range of motion, Decreased strength, Difficulty walking, Increased edema, Impaired flexibility, Pain  Visit Diagnosis: Muscle weakness (generalized)  Difficulty in walking, not elsewhere classified  Stiffness of right knee, not elsewhere classified  Acute pain of right knee  Localized edema     Problem List Patient Active Problem List   Diagnosis Date Noted   S/P total knee arthroplasty, right 05/21/2021    Genetic testing 06/07/2018   History of breast cancer 04/19/2018   Wears hearing aid    Wears glasses    Vaginal vault prolapse    Hiatal hernia    BPPV (benign paroxysmal positional vertigo)    Palpitations 10/31/2016   Radicular pain of right lower back 04/06/2015   Compression fracture of L3 lumbar vertebra 04/06/2015   Senile osteoporosis 04/06/2015   DDD (degenerative disc disease), lumbar 04/06/2015   Other malaise and fatigue 01/17/2014   Hx of adenomatous colonic polyps 07/11/2013   Hyperlipidemia LDL goal <100    Bladder prolapse, female, acquired    Esophageal reflux 10/12/2011    BDebbe Odea PT,DPT 06/23/2021, 3:55 PM  [facility] [address] [address]Phone: [phone]  Fax:  [phone] Name: Rebecca VANLEEUWENMRN: [id number] of Birth: 08/20/1933-02-16

## 2021-06-25 ENCOUNTER — Encounter: Payer: Self-pay | Admitting: Orthopaedic Surgery

## 2021-06-25 ENCOUNTER — Ambulatory Visit (INDEPENDENT_AMBULATORY_CARE_PROVIDER_SITE_OTHER): Payer: Medicare PPO | Admitting: Orthopaedic Surgery

## 2021-06-25 ENCOUNTER — Other Ambulatory Visit: Payer: Self-pay

## 2021-06-25 VITALS — BP 130/71 | Ht 65.0 in | Wt 167.0 lb

## 2021-06-25 DIAGNOSIS — M5136 Other intervertebral disc degeneration, lumbar region: Secondary | ICD-10-CM

## 2021-06-25 DIAGNOSIS — M545 Low back pain, unspecified: Secondary | ICD-10-CM

## 2021-06-25 DIAGNOSIS — Z96651 Presence of right artificial knee joint: Secondary | ICD-10-CM

## 2021-06-25 DIAGNOSIS — G8929 Other chronic pain: Secondary | ICD-10-CM

## 2021-06-25 DIAGNOSIS — M51369 Other intervertebral disc degeneration, lumbar region without mention of lumbar back pain or lower extremity pain: Secondary | ICD-10-CM

## 2021-06-25 MED ORDER — GABAPENTIN 100 MG PO CAPS: ORAL_CAPSULE | ORAL | 1 refills | Status: DC

## 2021-06-25 NOTE — Progress Notes (Deleted)
Post-Op Visit Note   Patient: Rebecca Guzman           Date of Birth: 1933-09-27           MRN: [id number] Visit Date: 06/25/2021 PCP: Hoyt Koch, MD   Assessment & Plan:  Chief Complaint:  Chief Complaint  Patient presents with   Right Knee - Follow-up    05/21/2021 right TKA   Visit Diagnoses:  1. Chronic right-sided low back pain, unspecified whether sciatica present   2. DDD (degenerative disc disease), lumbar   3. Hx of total knee arthroplasty, right     Plan: ***  Follow-Up Instructions: Return in about 1 month (around 07/25/2021).   Orders:  Orders Placed This Encounter  Procedures   Ambulatory referral to Physical Medicine Rehab   Meds ordered this encounter  Medications   gabapentin (NEURONTIN) 100 MG capsule    Sig: Take one at night for one week then increase to 2 tablets at bedtime.    Dispense:  60 capsule    Refill:  1    Imaging: No results found.  PMFS History: Patient Active Problem List   Diagnosis Date Noted   Hx of total knee arthroplasty, right 05/21/2021   Genetic testing 06/07/2018   History of breast cancer 04/19/2018   Wears hearing aid    Wears glasses    Vaginal vault prolapse    Hiatal hernia    BPPV (benign paroxysmal positional vertigo)    Palpitations 10/31/2016   Radicular pain of right lower back 04/06/2015   Compression fracture of L3 lumbar vertebra 04/06/2015   Senile osteoporosis 04/06/2015   DDD (degenerative disc disease), lumbar 04/06/2015   Other malaise and fatigue 01/17/2014   Hx of adenomatous colonic polyps 07/11/2013   Hyperlipidemia LDL goal <100    Bladder prolapse, female, acquired    Esophageal reflux 10/12/2011   Past Medical History:  Diagnosis Date   Asthma    BPPV (benign paroxysmal positional vertigo)    Breast cancer ([place])    left mastectomy 1992, right lumpectomy 2019   Cancer ([place])    Compression fracture of L3 lumbar vertebra 04/06/2015   Dysrhythmia 2019   pt states  Cardiologist told her it is "pre A-fib"   Family history of breast cancer    Family history of colon cancer    Family history of pancreatic cancer    GERD (gastroesophageal reflux disease)    Hiatal hernia    pt states she doesnt have this   History of adenomatous polyp of colon    tubular adenoma 2005 and  2014 tubular adenoma and  hyperplastic polyp   History of breast cancer per pt no recurrence   dx 1992 --  s/p  left breast mastectomy and chemotherapy   Hyperlipidemia LDL goal <100    Osteoarthritis of right knee    Personal history of chemotherapy 1992   PONV (postoperative nausea and vomiting)    and "gets weak and light-leaded"   Sensation of pressure in bladder area    Vaginal vault prolapse    anterior   Wears glasses    Wears hearing aid    left only    Family History  Problem Relation Age of Onset   Heart attack Mother    Colon cancer Mother 73   Hypertension Mother    Heart disease Father    Breast cancer Sister        pat half sister dx in her 41s  Pancreatic cancer Other 59    Past Surgical History:  Procedure Laterality Date   BREAST BIOPSY Right 04/19/2018   BREAST LUMPECTOMY Right 05/17/2018   BREAST LUMPECTOMY WITH RADIOACTIVE SEED LOCALIZATION Right 05/17/2018   Procedure: RIGHT BREAST LUMPECTOMY WITH RADIOACTIVE SEED LOCALIZATION;  Surgeon: [name], MD;  Location: [place];  Service: General;  Laterality: Right;   CATARACT EXTRACTION W/ INTRAOCULAR LENS  IMPLANT, BILATERAL  2014   CHOLECYSTECTOMY  1985   COLONOSCOPY  last one 06-04-2013   CYSTOCELE REPAIR N/A 02/29/2016   Procedure: ANTERIOR VAULT PROLAPSE REPAIR COLOPLAST [place] FIXATION AUGMENTED AXIS DERMIS REPAIR  ;  Surgeon: Carolan Clines, MD;  Location: [place];  Service: Urology;  Laterality: N/A;   ESOPHAGOGASTRODUODENOSCOPY  08-31-2009   LAPAROSCOPIC ASSISTED VAGINAL HYSTERECTOMY  01-10-2000   w/ Bilateral Salpingoophorectomy /  Anterior & Posterior repair/   Pubovaginal Sling   MASTECTOMY Left 1992   TOTAL KNEE ARTHROPLASTY Right 05/21/2021   Procedure: RIGHT TOTAL KNEE ARTHROPLASTY;  Surgeon: Marybelle Killings, MD;  Location: [place];  Service: Orthopedics;  Laterality: Right;   Social History   Occupational History   Occupation: Retired Product manager: RETIRED  Tobacco Use   Smoking status: Former    Years: 13.00    Types: Cigarettes    Quit date: 10/12/1983    Years since quitting: 37.7   Smokeless tobacco: Never  Vaping Use   Vaping Use: Never used  Substance and Sexual Activity   Alcohol use: No   Drug use: No   Sexual activity: Not on file      Post-Op Visit Note   Patient: Rebecca Guzman           Date of Birth: June 03, 1934           MRN: [id number] Visit Date: 06/25/2021 PCP: Hoyt Koch, MD   Assessment & Plan: Postop right total knee arthroplasty.  Patient returns and she is actually walking without a cane.  She has flexion 110 full extension and only trace quad weakness which at 1 month postop for an 87 year old female is remarkable progress.  Unfortunate with the activity she is flared up her back symptoms and history of lumbar disc degeneration with scoliosis and has always got excellent response with the epidural.  She is requesting referral back to Dr. Ernestina Patches for single epidural.  Last lumbar injection was in 2020.  We will start patient on some low-dose Neurontin 100 mg 1 at night for a week and then increase to 2 tablets at night number 60 tablets 1 refill.  We will set her up for single epidural with Dr. Ernestina Patches since she has gotten great relief in the past.  She is continuing her gradual strengthening of her quad.  Recheck 1 month.  Chief Complaint:  Chief Complaint  Patient presents with   Right Knee - Follow-up    05/21/2021 right TKA   Visit Diagnoses:  1. Chronic right-sided low back pain, unspecified whether sciatica present   2. DDD (degenerative disc disease), lumbar   3. Hx of total knee  arthroplasty, right     Plan: Lumbar epidural ordered recheck 1 month.  Neurontin started.  Follow-Up Instructions: Return in about 1 month (around 07/25/2021).   Orders:  Orders Placed This Encounter  Procedures   Ambulatory referral to Physical Medicine Rehab   Meds ordered this encounter  Medications   gabapentin (NEURONTIN) 100 MG capsule    Sig: Take one at night for one  week then increase to 2 tablets at bedtime.    Dispense:  60 capsule    Refill:  1    Imaging: No results found.  PMFS History: Patient Active Problem List   Diagnosis Date Noted   Hx of total knee arthroplasty, right 05/21/2021   Genetic testing 06/07/2018   History of breast cancer 04/19/2018   Wears hearing aid    Wears glasses    Vaginal vault prolapse    Hiatal hernia    BPPV (benign paroxysmal positional vertigo)    Palpitations 10/31/2016   Radicular pain of right lower back 04/06/2015   Compression fracture of L3 lumbar vertebra 04/06/2015   Senile osteoporosis 04/06/2015   DDD (degenerative disc disease), lumbar 04/06/2015   Other malaise and fatigue 01/17/2014   Hx of adenomatous colonic polyps 07/11/2013   Hyperlipidemia LDL goal <100    Bladder prolapse, female, acquired    Esophageal reflux 10/12/2011   Past Medical History:  Diagnosis Date   Asthma    BPPV (benign paroxysmal positional vertigo)    Breast cancer ([place])    left mastectomy 1992, right lumpectomy 2019   Cancer ([place])    Compression fracture of L3 lumbar vertebra 04/06/2015   Dysrhythmia 2019   pt states Cardiologist told her it is "pre A-fib"   Family history of breast cancer    Family history of colon cancer    Family history of pancreatic cancer    GERD (gastroesophageal reflux disease)    Hiatal hernia    pt states she doesnt have this   History of adenomatous polyp of colon    tubular adenoma 2005 and  2014 tubular adenoma and  hyperplastic polyp   History of breast cancer per pt no recurrence   dx 1992  --  s/p  left breast mastectomy and chemotherapy   Hyperlipidemia LDL goal <100    Osteoarthritis of right knee    Personal history of chemotherapy 1992   PONV (postoperative nausea and vomiting)    and "gets weak and light-leaded"   Sensation of pressure in bladder area    Vaginal vault prolapse    anterior   Wears glasses    Wears hearing aid    left only    Family History  Problem Relation Age of Onset   Heart attack Mother    Colon cancer Mother 54   Hypertension Mother    Heart disease Father    Breast cancer Sister        pat half sister dx in her 63s   Pancreatic cancer Other 15    Past Surgical History:  Procedure Laterality Date   BREAST BIOPSY Right 04/19/2018   BREAST LUMPECTOMY Right 05/17/2018   BREAST LUMPECTOMY WITH RADIOACTIVE SEED LOCALIZATION Right 05/17/2018   Procedure: RIGHT BREAST LUMPECTOMY WITH RADIOACTIVE SEED LOCALIZATION;  Surgeon: [name], MD;  Location: [place];  Service: General;  Laterality: Right;   CATARACT EXTRACTION W/ INTRAOCULAR LENS  IMPLANT, BILATERAL  2014   CHOLECYSTECTOMY  1985   COLONOSCOPY  last one 06-04-2013   CYSTOCELE REPAIR N/A 02/29/2016   Procedure: ANTERIOR VAULT PROLAPSE REPAIR COLOPLAST [place] FIXATION AUGMENTED AXIS DERMIS REPAIR  ;  Surgeon: Carolan Clines, MD;  Location: [place];  Service: Urology;  Laterality: N/A;   ESOPHAGOGASTRODUODENOSCOPY  08-31-2009   LAPAROSCOPIC ASSISTED VAGINAL HYSTERECTOMY  01-10-2000   w/ Bilateral Salpingoophorectomy /  Anterior & Posterior repair/  Pubovaginal Sling   MASTECTOMY Left 1992   TOTAL KNEE ARTHROPLASTY Right  05/21/2021   Procedure: RIGHT TOTAL KNEE ARTHROPLASTY;  Surgeon: Marybelle Killings, MD;  Location: [place];  Service: Orthopedics;  Laterality: Right;   Social History   Occupational History   Occupation: Retired Product manager: RETIRED  Tobacco Use   Smoking status: Former    Years: 13.00    Types: Cigarettes    Quit date: 10/12/1983     Years since quitting: 37.7   Smokeless tobacco: Never  Vaping Use   Vaping Use: Never used  Substance and Sexual Activity   Alcohol use: No   Drug use: No   Sexual activity: Not on file

## 2021-06-28 ENCOUNTER — Other Ambulatory Visit: Payer: Self-pay

## 2021-06-28 ENCOUNTER — Encounter: Payer: Self-pay | Admitting: Rehabilitative and Restorative Service Providers"

## 2021-06-28 ENCOUNTER — Ambulatory Visit: Payer: Medicare PPO | Admitting: Rehabilitative and Restorative Service Providers"

## 2021-06-28 DIAGNOSIS — M25561 Pain in right knee: Secondary | ICD-10-CM

## 2021-06-28 DIAGNOSIS — M25661 Stiffness of right knee, not elsewhere classified: Secondary | ICD-10-CM

## 2021-06-28 DIAGNOSIS — R262 Difficulty in walking, not elsewhere classified: Secondary | ICD-10-CM | POA: Diagnosis not present

## 2021-06-28 DIAGNOSIS — M6281 Muscle weakness (generalized): Secondary | ICD-10-CM

## 2021-06-28 DIAGNOSIS — R600 Localized edema: Secondary | ICD-10-CM | POA: Diagnosis not present

## 2021-06-28 NOTE — Patient Instructions (Signed)
Discussed activities to avoid (flexion) with her back.  Reinforced gentle trunk extension and hip hiking to help prepare her back and legs for return to walking.

## 2021-06-28 NOTE — Therapy (Signed)
[facility] [address] [place], [place], [address] Phone: [phone]   Fax:  [phone]  Physical Therapy Treatment  Patient Details  Name: Rebecca Guzman MRN: [id number] Date of Birth: [birth year] Referring Provider (PT): Marybelle Killings, MD   Encounter Date: 06/28/2021   PT End of Session - 06/28/21 1602     Visit Number 6    Number of Visits 12    Date for PT Re-Evaluation 07/20/21    Authorization Type [employer]    Progress Note Due on Visit 10    PT Start Time 1509    PT Stop Time 6811    PT Time Calculation (min) 55 min    Activity Tolerance Patient tolerated treatment well;No increased pain    Behavior During Therapy WFL for tasks assessed/performed             Past Medical History:  Diagnosis Date   Asthma    BPPV (benign paroxysmal positional vertigo)    Breast cancer ([place])    left mastectomy 1992, right lumpectomy 2019   Cancer ([place])    Compression fracture of L3 lumbar vertebra 04/06/2015   Dysrhythmia 2019   pt states Cardiologist told her it is "pre A-fib"   Family history of breast cancer    Family history of colon cancer    Family history of pancreatic cancer    GERD (gastroesophageal reflux disease)    Hiatal hernia    pt states she doesnt have this   History of adenomatous polyp of colon    tubular adenoma 2005 and  2014 tubular adenoma and  hyperplastic polyp   History of breast cancer per pt no recurrence   dx 1992 --  s/p  left breast mastectomy and chemotherapy   Hyperlipidemia LDL goal <100    Osteoarthritis of right knee    Personal history of chemotherapy 1992   PONV (postoperative nausea and vomiting)    and "gets weak and light-leaded"   Sensation of pressure in bladder area    Vaginal vault prolapse    anterior   Wears glasses    Wears hearing aid    left only    Past Surgical History:  Procedure Laterality Date   BREAST BIOPSY Right 04/19/2018   BREAST LUMPECTOMY Right 05/17/2018    BREAST LUMPECTOMY WITH RADIOACTIVE SEED LOCALIZATION Right 05/17/2018   Procedure: RIGHT BREAST LUMPECTOMY WITH RADIOACTIVE SEED LOCALIZATION;  Surgeon: [name], MD;  Location: [place];  Service: General;  Laterality: Right;   CATARACT EXTRACTION W/ INTRAOCULAR LENS  IMPLANT, BILATERAL  2014   CHOLECYSTECTOMY  1985   COLONOSCOPY  last one 06-04-2013   CYSTOCELE REPAIR N/A 02/29/2016   Procedure: ANTERIOR VAULT PROLAPSE REPAIR COLOPLAST [place]  ;  Surgeon: Carolan Clines, MD;  Location: [place];  Service: Urology;  Laterality: N/A;   ESOPHAGOGASTRODUODENOSCOPY  08-31-2009   LAPAROSCOPIC ASSISTED VAGINAL HYSTERECTOMY  01-10-2000   w/ Bilateral Salpingoophorectomy /  Anterior & Posterior repair/  Pubovaginal Sling   MASTECTOMY Left 1992   TOTAL KNEE ARTHROPLASTY Right 05/21/2021   Procedure: RIGHT TOTAL KNEE ARTHROPLASTY;  Surgeon: Marybelle Killings, MD;  Location: [place];  Service: Orthopedics;  Laterality: Right;    There were no vitals filed for this visit.   Subjective Assessment - 06/28/21 1554     Subjective [name] reports more stiffness than pain.  She reports good HEP compliance.  She is taking tylenol 3X/day.    Pertinent History PMH:  vertigo,lumbar DDD,osteoporosis, breast ca treated with chemo and mastectomy    How long can you sit comfortably? She can sit as long as she needs to.    How long can you stand comfortably? 30-45 minutes    How long can you walk comfortably? 15 minutes    Patient Stated Goals walking in neighborhood, carrying in groceries, housekeeping    Currently in Pain? No/denies    Pain Score 0-No pain    Pain Location Knee    Pain Orientation Right    Pain Descriptors / Indicators Tightness    Pain Type Surgical pain    Pain Radiating Towards NA    Pain Onset More than a month ago    Pain Frequency Intermittent    Aggravating Factors  Too much WB and late day fatigue    Pain Relieving Factors  Ice, tylenol, exercise    Effect of Pain on Daily Activities Uses cane and limited to about 15 minutes of walking due to R knee pain edema    Multiple Pain Sites No                               [facility] Adult PT Treatment/Exercise - 06/28/21 0001       Therapeutic Activites    Therapeutic Activities ADL's    ADL's Step-up and over no hands 4 and 6 inch step slow eccentrics      Neuro Re-ed    Neuro Re-ed Details  Tandem balance: 1) head moving side to side and 2) eyes closed 3X each 20 seconds encourage step strategy      Exercises   Exercises Knee/Hip      Knee/Hip Exercises: Machines for Strengthening   Cybex Knee Extension 90-30 up both, down R only 2 sets of 10 10#    Total Gym Leg Press 106# double leg slow eccentrics and R only 50# slow eccentrics 15X each      Knee/Hip Exercises: Standing   Other Standing Knee Exercises Hip hike with shoulder against door frame 2 sets of 5 for 3 seconds with perfect posture    Other Standing Knee Exercises Standing trunk extension AROM 10X 3 seconds      Modalities   Modalities Vasopneumatic      Vasopneumatic   Number Minutes Vasopneumatic  10 minutes    Vasopnuematic Location  Knee    Vasopneumatic Pressure Medium    Vasopneumatic Temperature  34*                     PT Education - 06/28/21 1602     Education Details See patient instructions.    Person(s) Educated Patient    Methods Explanation;Demonstration;Verbal cues    Comprehension Verbalized understanding;Need further instruction;Returned demonstration;Verbal cues required              PT Short Term Goals - 06/17/21 1617       PT SHORT TERM GOAL #1   Title independent with HEP    Time 4    Period Weeks    Status Achieved    Target Date 07/06/21      PT SHORT TERM GOAL #2   Title perform 5 times sit to stand less than 16 seconds    Time 4    Period Weeks    Status On-going    Target Date 07/06/21               PT  Long Term Goals - 06/23/21 1555       PT LONG TERM GOAL #1   Title Pt will improve FOTO score to 52% functional    Time 6    Period Weeks    Status On-going      PT LONG TERM GOAL #2   Title report pain < 3/10 with activity for improved mobility    Time 6    Period Weeks    Status On-going      PT LONG TERM GOAL #3   Title She will improve Rt knee PROM 0-120 deg to improve function    Time 6    Period Weeks    Status New      PT LONG TERM GOAL #4   Title Pt will improve Rt knee strength to 5/5 MMT to improve functional strength    Time 6    Period Weeks    Status On-going      PT LONG TERM GOAL #5   Title Pt will improve 5 times sit to stand test and TUG test to 13 seconds or less without AD to show improved balance and gait speed    Time 6    Period Weeks    Status New                   Plan - 06/28/21 1614     Clinical Impression Statement Olivianna is doing very well for this point post-TKA.  Today focused on quadriceps strength, hip abductors strength, balance and function.  Continued work will do the same along with working on safe transition from cane to no assistive device.    Personal Factors and Comorbidities Comorbidity 3+    Comorbidities PMH: vertigo,lumbar DDD,osteoporosis, breast ca treated with chemo and mastectomy,    Examination-Activity Limitations Bend;Lift;Stand;Stairs;Squat;Sleep;Locomotion Level    Examination-Participation Restrictions Cleaning;Driving;Community Activity;Laundry;Shop    Stability/Clinical Decision Making Evolving/Moderate complexity    Rehab Potential Good    PT Frequency 2x / week    PT Duration 6 weeks    PT Treatment/Interventions Cryotherapy;Electrical Stimulation;Traction;Ultrasound;Iontophoresis 4mg /ml Dexamethasone;Moist Heat;Gait training;Therapeutic activities;Therapeutic exercise;Neuromuscular re-education;Stair training;Patient/family education;Manual techniques;Joint Manipulations;Dry needling;Passive range of  motion;Taping;Vasopneumatic Device    PT Next Visit Plan Quadriceps and hip abductors strength, balance, stairs, edema control.    PT Home Exercise Plan Access Code: [email or web address]    Consulted and Agree with Plan of Care Patient             Patient will benefit from skilled therapeutic intervention in order to improve the following deficits and impairments:  Decreased activity tolerance, Decreased balance, Decreased endurance, Decreased mobility, Decreased range of motion, Decreased strength, Difficulty walking, Increased edema, Impaired flexibility, Pain  Visit Diagnosis: Difficulty in walking, not elsewhere classified  Muscle weakness (generalized)  Localized edema  Stiffness of right knee, not elsewhere classified  Acute pain of right knee     Problem List Patient Active Problem List   Diagnosis Date Noted   Hx of total knee arthroplasty, right 05/21/2021   Genetic testing 06/07/2018   History of breast cancer 04/19/2018   Wears hearing aid    Wears glasses    Vaginal vault prolapse    Hiatal hernia    BPPV (benign paroxysmal positional vertigo)    Palpitations 10/31/2016   Radicular pain of right lower back 04/06/2015   Compression fracture of L3 lumbar vertebra 04/06/2015   Senile osteoporosis 04/06/2015   DDD (degenerative disc disease), lumbar 04/06/2015   Other malaise and fatigue 01/17/2014  Hx of adenomatous colonic polyps 07/11/2013   Hyperlipidemia LDL goal <100    Bladder prolapse, female, acquired    Esophageal reflux 10/12/2011    Farley Ly, PT, MPT 06/28/2021, 4:17 PM  [facility] [address] [place], [place], [address] Phone: [phone]   Fax:  [phone]  Name: MADELLINE ESHBACH MRN: [id number] Date of Birth: 1934/05/26

## 2021-06-30 ENCOUNTER — Encounter: Payer: Self-pay | Admitting: Rehabilitative and Restorative Service Providers"

## 2021-06-30 ENCOUNTER — Other Ambulatory Visit: Payer: Self-pay

## 2021-06-30 ENCOUNTER — Ambulatory Visit: Payer: Medicare PPO | Admitting: Rehabilitative and Restorative Service Providers"

## 2021-06-30 DIAGNOSIS — R262 Difficulty in walking, not elsewhere classified: Secondary | ICD-10-CM | POA: Diagnosis not present

## 2021-06-30 DIAGNOSIS — M25561 Pain in right knee: Secondary | ICD-10-CM | POA: Diagnosis not present

## 2021-06-30 DIAGNOSIS — R600 Localized edema: Secondary | ICD-10-CM

## 2021-06-30 DIAGNOSIS — M25661 Stiffness of right knee, not elsewhere classified: Secondary | ICD-10-CM

## 2021-06-30 DIAGNOSIS — M6281 Muscle weakness (generalized): Secondary | ICD-10-CM | POA: Diagnosis not present

## 2021-06-30 NOTE — Patient Instructions (Addendum)
Access Code: [email or web address] URL: [email or web address] Date: 06/30/2021 Prepared by: Vista Mink  Exercises  Supine Quadricep Sets - 3-5 x daily - 7 x weekly - 1-3 sets - 10 reps - 5 second hold Tandem Stance - 2 x daily - 7 x weekly - 1 sets - 5 reps - 20 second hold Standing Lumbar Extension at [place] 5 x daily - 7 x weekly - 1 sets - 5 reps - 3 seconds hold Yoga Bridge - 1-2 x daily - 7 x weekly - 1 sets - 10 reps - 5 seconds hold

## 2021-06-30 NOTE — Therapy (Signed)
[facility] [address] [place], [place], [address] Phone: [phone]   Fax:  [phone]  Physical Therapy Treatment  Patient Details  Name: Rebecca Guzman MRN: [id number] Date of Birth: Sep 18, 1933 Referring Provider (PT): Marybelle Killings, MD   Encounter Date: 06/30/2021   PT End of Session - 06/30/21 1629     Visit Number 7    Number of Visits 12    Date for PT Re-Evaluation 07/20/21    Authorization Type [employer]    Progress Note Due on Visit 10    PT Start Time 8338    PT Stop Time 1528    PT Time Calculation (min) 54 min    Activity Tolerance Patient tolerated treatment well;No increased pain    Behavior During Therapy WFL for tasks assessed/performed             Past Medical History:  Diagnosis Date   Asthma    BPPV (benign paroxysmal positional vertigo)    Breast cancer ([place])    left mastectomy 1992, right lumpectomy 2019   Cancer ([place])    Compression fracture of L3 lumbar vertebra 04/06/2015   Dysrhythmia 2019   pt states Cardiologist told her it is "pre A-fib"   Family history of breast cancer    Family history of colon cancer    Family history of pancreatic cancer    GERD (gastroesophageal reflux disease)    Hiatal hernia    pt states she doesnt have this   History of adenomatous polyp of colon    tubular adenoma 2005 and  2014 tubular adenoma and  hyperplastic polyp   History of breast cancer per pt no recurrence   dx 1992 --  s/p  left breast mastectomy and chemotherapy   Hyperlipidemia LDL goal <100    Osteoarthritis of right knee    Personal history of chemotherapy 1992   PONV (postoperative nausea and vomiting)    and "gets weak and light-leaded"   Sensation of pressure in bladder area    Vaginal vault prolapse    anterior   Wears glasses    Wears hearing aid    left only    Past Surgical History:  Procedure Laterality Date   BREAST BIOPSY Right 04/19/2018   BREAST LUMPECTOMY Right 05/17/2018    BREAST LUMPECTOMY WITH RADIOACTIVE SEED LOCALIZATION Right 05/17/2018   Procedure: RIGHT BREAST LUMPECTOMY WITH RADIOACTIVE SEED LOCALIZATION;  Surgeon: [name], MD;  Location: [place];  Service: General;  Laterality: Right;   CATARACT EXTRACTION W/ INTRAOCULAR LENS  IMPLANT, BILATERAL  2014   CHOLECYSTECTOMY  1985   COLONOSCOPY  last one 06-04-2013   CYSTOCELE REPAIR N/A 02/29/2016   Procedure: ANTERIOR VAULT PROLAPSE REPAIR COLOPLAST [place]  ;  Surgeon: Carolan Clines, MD;  Location: [place];  Service: Urology;  Laterality: N/A;   ESOPHAGOGASTRODUODENOSCOPY  08-31-2009   LAPAROSCOPIC ASSISTED VAGINAL HYSTERECTOMY  01-10-2000   w/ Bilateral Salpingoophorectomy /  Anterior & Posterior repair/  Pubovaginal Sling   MASTECTOMY Left 1992   TOTAL KNEE ARTHROPLASTY Right 05/21/2021   Procedure: RIGHT TOTAL KNEE ARTHROPLASTY;  Surgeon: Marybelle Killings, MD;  Location: [place];  Service: Orthopedics;  Laterality: Right;    There were no vitals filed for this visit.   Subjective Assessment - 06/30/21 1626     Subjective Genea reports she was very sore in her quadriceps after her last visit.  Good HEP compliance continues.    Pertinent History PMH:  vertigo,lumbar DDD,osteoporosis, breast ca treated with chemo and mastectomy    How long can you sit comfortably? She can sit as long as she needs to.    How long can you stand comfortably? 30-45 minutes    How long can you walk comfortably? 15 minutes    Patient Stated Goals walking in neighborhood, carrying in groceries, housekeeping    Currently in Pain? No/denies    Pain Score 0-No pain    Pain Location Knee    Pain Orientation Right    Pain Descriptors / Indicators Tightness    Pain Type Surgical pain    Pain Radiating Towards NA    Pain Onset More than a month ago    Pain Frequency Intermittent    Aggravating Factors  Standing and walking > 10-15 minutes    Pain Relieving  Factors Rest, exercises, ice    Effect of Pain on Daily Activities Although she is using the cane less often, late day edema and back pain limit function    Multiple Pain Sites No                [facility] PT Assessment - 06/30/21 0001       AROM   Right Knee Extension -2    Right Knee Flexion 121                           [facility] Adult PT Treatment/Exercise - 06/30/21 0001       Neuro Re-ed    Neuro Re-ed Details  Tandem balance: 1) eyes open static 3X 20 seconds and 2) eyes open dynamic forward and back 3 laps each.  Single leg stance 4X 10 seconds each leg.  Encourage slight knee flexion and step strategy.      Exercises   Exercises Knee/Hip      Knee/Hip Exercises: Machines for Strengthening   Cybex Knee Extension 90-30 up both, down R only 2 sets of 10 5#    Total Gym Leg Press 106# double leg slow eccentrics and R only 50# slow eccentrics 15X each      Knee/Hip Exercises: Standing   Other Standing Knee Exercises Hip hike with shoulder against door frame 3X each side for 3 seconds with perfect posture    Other Standing Knee Exercises Standing trunk extension AROM 10X 3 seconds      Knee/Hip Exercises: Supine   Other Supine Knee/Hip Exercises Bridging 10X 5 seconds      Modalities   Modalities Vasopneumatic      Vasopneumatic   Number Minutes Vasopneumatic  10 minutes    Vasopnuematic Location  Knee    Vasopneumatic Pressure Medium    Vasopneumatic Temperature  34*                     PT Education - 06/30/21 1628     Education Details Reviewed spine mechanics and exercises to reduce back pain.  Modified quadriceps strengthening to decrease soreness while still getting stronger.  Reviewed emphasis on 4 above listed exercises (see patient instructions).    Person(s) Educated Patient    Methods Explanation;Demonstration;Tactile cues;Verbal cues;Handout    Comprehension Verbalized understanding;Tactile cues required;Need further  instruction;Returned demonstration;Verbal cues required              PT Short Term Goals - 06/17/21 1617       PT SHORT TERM GOAL #1   Title independent with HEP    Time 4  Period Weeks    Status Achieved    Target Date 07/06/21      PT SHORT TERM GOAL #2   Title perform 5 times sit to stand less than 16 seconds    Time 4    Period Weeks    Status On-going    Target Date 07/06/21               PT Long Term Goals - 06/23/21 1555       PT LONG TERM GOAL #1   Title Pt will improve FOTO score to 52% functional    Time 6    Period Weeks    Status On-going      PT LONG TERM GOAL #2   Title report pain < 3/10 with activity for improved mobility    Time 6    Period Weeks    Status On-going      PT LONG TERM GOAL #3   Title She will improve Rt knee PROM 0-120 deg to improve function    Time 6    Period Weeks    Status New      PT LONG TERM GOAL #4   Title Pt will improve Rt knee strength to 5/5 MMT to improve functional strength    Time 6    Period Weeks    Status On-going      PT LONG TERM GOAL #5   Title Pt will improve 5 times sit to stand test and TUG test to 13 seconds or less without AD to show improved balance and gait speed    Time 6    Period Weeks    Status New                   Plan - 06/30/21 1631     Clinical Impression Statement Moranda has great AROM.  She was very sore in her quadriceps after her last visit (quad strength emphasis that visit).  We modified activities to continue strengthening with less soreness.  Also added bridging for quadriceps, hip extensors and lumbar extensor strengthening.  Continue strength and balance work to [employer] safe without a cane and improve standing/walking endurance.    Personal Factors and Comorbidities Comorbidity 3+    Comorbidities PMH: vertigo,lumbar DDD,osteoporosis, breast ca treated with chemo and mastectomy,    Examination-Activity Limitations  Bend;Lift;Stand;Stairs;Squat;Sleep;Locomotion Level    Examination-Participation Restrictions Cleaning;Driving;Community Activity;Laundry;Shop    Stability/Clinical Decision Making Evolving/Moderate complexity    Rehab Potential Good    PT Frequency 2x / week    PT Duration 6 weeks    PT Treatment/Interventions Cryotherapy;Electrical Stimulation;Traction;Ultrasound;Iontophoresis 4mg /ml Dexamethasone;Moist Heat;Gait training;Therapeutic activities;Therapeutic exercise;Neuromuscular re-education;Stair training;Patient/family education;Manual techniques;Joint Manipulations;Dry needling;Passive range of motion;Taping;Vasopneumatic Device    PT Next Visit Plan Quadriceps, low back and hip abductors strength, balance, edema control.    PT Home Exercise Plan Access Code: [email or web address]    Consulted and Agree with Plan of Care Patient             Patient will benefit from skilled therapeutic intervention in order to improve the following deficits and impairments:  Decreased activity tolerance, Decreased balance, Decreased endurance, Decreased mobility, Decreased range of motion, Decreased strength, Difficulty walking, Increased edema, Impaired flexibility, Pain  Visit Diagnosis: Difficulty in walking, not elsewhere classified  Muscle weakness (generalized)  Localized edema  Stiffness of right knee, not elsewhere classified  Acute pain of right knee     Problem List Patient Active Problem List   Diagnosis Date Noted  Hx of total knee arthroplasty, right 05/21/2021   Genetic testing 06/07/2018   History of breast cancer 04/19/2018   Wears hearing aid    Wears glasses    Vaginal vault prolapse    Hiatal hernia    BPPV (benign paroxysmal positional vertigo)    Palpitations 10/31/2016   Radicular pain of right lower back 04/06/2015   Compression fracture of L3 lumbar vertebra 04/06/2015   Senile osteoporosis 04/06/2015   DDD (degenerative disc disease), lumbar 04/06/2015   Other  malaise and fatigue 01/17/2014   Hx of adenomatous colonic polyps 07/11/2013   Hyperlipidemia LDL goal <100    Bladder prolapse, female, acquired    Esophageal reflux 10/12/2011    Farley Ly, PT, MPT 06/30/2021, 4:34 PM  [facility] [address] [place], [place], [address] Phone: [phone]   Fax:  [phone]  Name: CHERITY BLICKENSTAFF MRN: [id number] Date of Birth: Dec 24, 1933

## 2021-07-06 ENCOUNTER — Other Ambulatory Visit: Payer: Self-pay

## 2021-07-06 ENCOUNTER — Ambulatory Visit: Payer: Medicare PPO | Admitting: Physical Therapy

## 2021-07-06 DIAGNOSIS — M25661 Stiffness of right knee, not elsewhere classified: Secondary | ICD-10-CM

## 2021-07-06 DIAGNOSIS — M6281 Muscle weakness (generalized): Secondary | ICD-10-CM | POA: Diagnosis not present

## 2021-07-06 DIAGNOSIS — R262 Difficulty in walking, not elsewhere classified: Secondary | ICD-10-CM | POA: Diagnosis not present

## 2021-07-06 DIAGNOSIS — M25561 Pain in right knee: Secondary | ICD-10-CM

## 2021-07-06 DIAGNOSIS — R600 Localized edema: Secondary | ICD-10-CM

## 2021-07-06 NOTE — Therapy (Signed)
[facility] Physical Therapy [address] [place], [place], [address] Phone: [phone]   Fax:  [phone]  Physical Therapy Treatment  Patient Details  Name: Rebecca Guzman MRN: [id number] Date of Birth: 03-12-1934 Referring Provider (PT): Marybelle Killings, MD   Encounter Date: 07/06/2021   PT End of Session - 07/06/21 1637     Visit Number 8    Number of Visits 12    Date for PT Re-Evaluation 07/20/21    Authorization Type [employer]    Progress Note Due on Visit 10    PT Start Time 1600    PT Stop Time 0947    PT Time Calculation (min) 45 min    Activity Tolerance Patient tolerated treatment well;No increased pain    Behavior During Therapy WFL for tasks assessed/performed             Past Medical History:  Diagnosis Date   Asthma    BPPV (benign paroxysmal positional vertigo)    Breast cancer ([place])    left mastectomy 1992, right lumpectomy 2019   Cancer ([place])    Compression fracture of L3 lumbar vertebra 04/06/2015   Dysrhythmia 2019   pt states Cardiologist told her it is "pre A-fib"   Family history of breast cancer    Family history of colon cancer    Family history of pancreatic cancer    GERD (gastroesophageal reflux disease)    Hiatal hernia    pt states she doesnt have this   History of adenomatous polyp of colon    tubular adenoma 2005 and  2014 tubular adenoma and  hyperplastic polyp   History of breast cancer per pt no recurrence   dx 1992 --  s/p  left breast mastectomy and chemotherapy   Hyperlipidemia LDL goal <100    Osteoarthritis of right knee    Personal history of chemotherapy 1992   PONV (postoperative nausea and vomiting)    and "gets weak and light-leaded"   Sensation of pressure in bladder area    Vaginal vault prolapse    anterior   Wears glasses    Wears hearing aid    left only    Past Surgical History:  Procedure Laterality Date   BREAST BIOPSY Right 04/19/2018   BREAST LUMPECTOMY Right 05/17/2018    BREAST LUMPECTOMY WITH RADIOACTIVE SEED LOCALIZATION Right 05/17/2018   Procedure: RIGHT BREAST LUMPECTOMY WITH RADIOACTIVE SEED LOCALIZATION;  Surgeon: [name], MD;  Location: [place];  Service: General;  Laterality: Right;   CATARACT EXTRACTION W/ INTRAOCULAR LENS  IMPLANT, BILATERAL  2014   CHOLECYSTECTOMY  1985   COLONOSCOPY  last one 06-04-2013   CYSTOCELE REPAIR N/A 02/29/2016   Procedure: ANTERIOR VAULT PROLAPSE REPAIR COLOPLAST [place]  ;  Surgeon: Carolan Clines, MD;  Location: [place];  Service: Urology;  Laterality: N/A;   ESOPHAGOGASTRODUODENOSCOPY  08-31-2009   LAPAROSCOPIC ASSISTED VAGINAL HYSTERECTOMY  01-10-2000   w/ Bilateral Salpingoophorectomy /  Anterior & Posterior repair/  Pubovaginal Sling   MASTECTOMY Left 1992   TOTAL KNEE ARTHROPLASTY Right 05/21/2021   Procedure: RIGHT TOTAL KNEE ARTHROPLASTY;  Surgeon: Marybelle Killings, MD;  Location: [place];  Service: Orthopedics;  Laterality: Right;    There were no vitals filed for this visit.   Subjective Assessment - 07/06/21 1626     Subjective She relays no pain and stiffness is getting better, still lacks some balance.    Pertinent History PMH: vertigo,lumbar DDD,osteoporosis, breast ca treated  with chemo and mastectomy    How long can you sit comfortably? She can sit as long as she needs to.    How long can you stand comfortably? 30-45 minutes    How long can you walk comfortably? 15 minutes    Patient Stated Goals walking in neighborhood, carrying in groceries, housekeeping    Pain Onset More than a month ago                               [facility] Adult PT Treatment/Exercise - 07/06/21 0001       Neuro Re-ed    Neuro Re-ed Details  tandem walk 3 round trips, balance with feet together on foam 1 min, balance with feet apart and eyes closed on foam 20 sec X3      Knee/Hip Exercises: Stretches   Press photographer Both;3 reps;30 seconds     Gastroc Stretch Limitations slantboard      Knee/Hip Exercises: Aerobic   Nustep Level 5 for 10 minutes      Knee/Hip Exercises: Machines for Strengthening   Total Gym Leg Press 112# double leg slow eccentrics and R only 50# slow eccentrics 2X 10 each      Knee/Hip Exercises: Standing   Heel Raises Limitations 15 heel and toe raises bilat    Lateral Step Up Right;15 reps;Hand Hold: 1;Step Height: 6"    Forward Step Up Right;15 reps;Hand Hold: 1;Step Height: 6"      Vasopneumatic   Number Minutes Vasopneumatic  10 minutes    Vasopnuematic Location  Knee    Vasopneumatic Pressure Medium    Vasopneumatic Temperature  34*                       PT Short Term Goals - 06/17/21 1617       PT SHORT TERM GOAL #1   Title independent with HEP    Time 4    Period Weeks    Status Achieved    Target Date 07/06/21      PT SHORT TERM GOAL #2   Title perform 5 times sit to stand less than 16 seconds    Time 4    Period Weeks    Status On-going    Target Date 07/06/21               PT Long Term Goals - 06/23/21 1555       PT LONG TERM GOAL #1   Title Pt will improve FOTO score to 52% functional    Time 6    Period Weeks    Status On-going      PT LONG TERM GOAL #2   Title report pain < 3/10 with activity for improved mobility    Time 6    Period Weeks    Status On-going      PT LONG TERM GOAL #3   Title She will improve Rt knee PROM 0-120 deg to improve function    Time 6    Period Weeks    Status New      PT LONG TERM GOAL #4   Title Pt will improve Rt knee strength to 5/5 MMT to improve functional strength    Time 6    Period Weeks    Status On-going      PT LONG TERM GOAL #5   Title Pt will improve 5 times sit to stand test and TUG  test to 13 seconds or less without AD to show improved balance and gait speed    Time 6    Period Weeks    Status New                   Plan - 07/06/21 1638     Clinical Impression Statement She  has done very well with PT, she has one more visit scheduled and likely will be ready to discharge. We will assess her goals and measumrents to determine DC vs continuing PT next visit.    Personal Factors and Comorbidities Comorbidity 3+    Comorbidities PMH: vertigo,lumbar DDD,osteoporosis, breast ca treated with chemo and mastectomy,    Examination-Activity Limitations Bend;Lift;Stand;Stairs;Squat;Sleep;Locomotion Level    Examination-Participation Restrictions Cleaning;Driving;Community Activity;Laundry;Shop    Stability/Clinical Decision Making Evolving/Moderate complexity    Rehab Potential Good    PT Frequency 2x / week    PT Duration 6 weeks    PT Treatment/Interventions Cryotherapy;Electrical Stimulation;Traction;Ultrasound;Iontophoresis 4mg /ml Dexamethasone;Moist Heat;Gait training;Therapeutic activities;Therapeutic exercise;Neuromuscular re-education;Stair training;Patient/family education;Manual techniques;Joint Manipulations;Dry needling;Passive range of motion;Taping;Vasopneumatic Device    PT Next Visit Plan likely DC    PT Home Exercise Plan Access Code: [email or web address]    Consulted and Agree with Plan of Care Patient             Patient will benefit from skilled therapeutic intervention in order to improve the following deficits and impairments:  Decreased activity tolerance, Decreased balance, Decreased endurance, Decreased mobility, Decreased range of motion, Decreased strength, Difficulty walking, Increased edema, Impaired flexibility, Pain  Visit Diagnosis: Difficulty in walking, not elsewhere classified  Muscle weakness (generalized)  Localized edema  Stiffness of right knee, not elsewhere classified  Acute pain of right knee     Problem List Patient Active Problem List   Diagnosis Date Noted   Hx of total knee arthroplasty, right 05/21/2021   Genetic testing 06/07/2018   History of breast cancer 04/19/2018   Wears hearing aid    Wears glasses    Vaginal  vault prolapse    Hiatal hernia    BPPV (benign paroxysmal positional vertigo)    Palpitations 10/31/2016   Radicular pain of right lower back 04/06/2015   Compression fracture of L3 lumbar vertebra 04/06/2015   Senile osteoporosis 04/06/2015   DDD (degenerative disc disease), lumbar 04/06/2015   Other malaise and fatigue 01/17/2014   Hx of adenomatous colonic polyps 07/11/2013   Hyperlipidemia LDL goal <100    Bladder prolapse, female, acquired    Esophageal reflux 10/12/2011    Debbe Odea, PT,DPT 07/06/2021, 4:39 PM  [facility] Physical Therapy [address] [place], [place], [address] Phone: [phone]   Fax:  [phone]  Name: LEN KLUVER MRN: [id number] Date of Birth: 1934-01-29

## 2021-07-08 ENCOUNTER — Ambulatory Visit: Payer: Medicare PPO | Admitting: Physical Therapy

## 2021-07-08 ENCOUNTER — Other Ambulatory Visit: Payer: Self-pay

## 2021-07-08 DIAGNOSIS — R262 Difficulty in walking, not elsewhere classified: Secondary | ICD-10-CM | POA: Diagnosis not present

## 2021-07-08 DIAGNOSIS — M25561 Pain in right knee: Secondary | ICD-10-CM

## 2021-07-08 DIAGNOSIS — M25661 Stiffness of right knee, not elsewhere classified: Secondary | ICD-10-CM | POA: Diagnosis not present

## 2021-07-08 DIAGNOSIS — R600 Localized edema: Secondary | ICD-10-CM

## 2021-07-08 DIAGNOSIS — M6281 Muscle weakness (generalized): Secondary | ICD-10-CM

## 2021-07-08 NOTE — Therapy (Signed)
[facility] Physical Therapy [address] [place], [place], [address] Phone: [phone]   Fax:  [phone]  Physical Therapy Treatment/Discharge PHYSICAL THERAPY DISCHARGE SUMMARY  Visits from Start of Care: 9  Current functional level related to goals / functional outcomes: See below   Remaining deficits: none   Education / Equipment: HEP Plan: Patient agrees to discharge.  Patient goals were met. Patient is being discharged due to meeting the stated rehab goals and being pleased with her overall progress      Patient Details  Name: Rebecca Guzman MRN: [id number] Date of Birth: 1933/11/22 Referring Provider (PT): Marybelle Killings, MD   Encounter Date: 07/08/2021   PT End of Session - 07/08/21 1627     Visit Number 9    Number of Visits 12    Date for PT Re-Evaluation 07/20/21    Authorization Type [employer]    Progress Note Due on Visit 10    PT Start Time 1600    PT Stop Time 1638    PT Time Calculation (min) 38 min    Activity Tolerance Patient tolerated treatment well;No increased pain    Behavior During Therapy WFL for tasks assessed/performed             Past Medical History:  Diagnosis Date   Asthma    BPPV (benign paroxysmal positional vertigo)    Breast cancer ([place])    left mastectomy 1992, right lumpectomy 2019   Cancer ([place])    Compression fracture of L3 lumbar vertebra 04/06/2015   Dysrhythmia 2019   pt states Cardiologist told her it is "pre A-fib"   Family history of breast cancer    Family history of colon cancer    Family history of pancreatic cancer    GERD (gastroesophageal reflux disease)    Hiatal hernia    pt states she doesnt have this   History of adenomatous polyp of colon    tubular adenoma 2005 and  2014 tubular adenoma and  hyperplastic polyp   History of breast cancer per pt no recurrence   dx 1992 --  s/p  left breast mastectomy and chemotherapy   Hyperlipidemia LDL goal <100    Osteoarthritis of right  knee    Personal history of chemotherapy 1992   PONV (postoperative nausea and vomiting)    and "gets weak and light-leaded"   Sensation of pressure in bladder area    Vaginal vault prolapse    anterior   Wears glasses    Wears hearing aid    left only    Past Surgical History:  Procedure Laterality Date   BREAST BIOPSY Right 04/19/2018   BREAST LUMPECTOMY Right 05/17/2018   BREAST LUMPECTOMY WITH RADIOACTIVE SEED LOCALIZATION Right 05/17/2018   Procedure: RIGHT BREAST LUMPECTOMY WITH RADIOACTIVE SEED LOCALIZATION;  Surgeon: [name], MD;  Location: [place];  Service: General;  Laterality: Right;   CATARACT EXTRACTION W/ INTRAOCULAR LENS  IMPLANT, BILATERAL  2014   CHOLECYSTECTOMY  1985   COLONOSCOPY  last one 06-04-2013   CYSTOCELE REPAIR N/A 02/29/2016   Procedure: ANTERIOR VAULT PROLAPSE REPAIR COLOPLAST [place]  ;  Surgeon: Carolan Clines, MD;  Location: [place];  Service: Urology;  Laterality: N/A;   ESOPHAGOGASTRODUODENOSCOPY  08-31-2009   LAPAROSCOPIC ASSISTED VAGINAL HYSTERECTOMY  01-10-2000   w/ Bilateral Salpingoophorectomy /  Anterior & Posterior repair/  Pubovaginal Sling   MASTECTOMY Left 1992   TOTAL KNEE ARTHROPLASTY Right 05/21/2021   Procedure: RIGHT  TOTAL KNEE ARTHROPLASTY;  Surgeon: Marybelle Killings, MD;  Location: [place];  Service: Orthopedics;  Laterality: Right;    There were no vitals filed for this visit.   Subjective Assessment - 07/08/21 1625     Subjective she denies pain and feels ready to discharge today, she is very pleased    Pertinent History PMH: vertigo,lumbar DDD,osteoporosis, breast ca treated with chemo and mastectomy    How long can you sit comfortably? She can sit as long as she needs to.    How long can you stand comfortably? 30-45 minutes    How long can you walk comfortably? 15 minutes    Patient Stated Goals walking in neighborhood, carrying in groceries, housekeeping     Pain Onset More than a month ago                [facility] PT Assessment - 07/08/21 0001       Assessment   Medical Diagnosis Rt knee TKA    Referring Provider (PT) Marybelle Killings, MD    Onset Date/Surgical Date 05/21/21      AROM   Right Knee Extension -1    Right Knee Flexion 121      PROM   Right Knee Extension 0    Right Knee Flexion 125      Strength   Overall Strength Comments 5/5 knee strength      Standardized Balance Assessment   Five times sit to stand comments  13 seconds no UE support      Timed Up and Go Test   Normal TUG (seconds) 13                           [facility] Adult PT Treatment/Exercise - 07/08/21 0001       Knee/Hip Exercises: Stretches   Gastroc Stretch Both;3 reps;30 seconds    Gastroc Stretch Limitations slantboard      Knee/Hip Exercises: Aerobic   Nustep Level 5 for 10 minutes      Knee/Hip Exercises: Machines for Strengthening   Cybex Knee Extension 90-30 up both, down R only 2 sets of 10 5#    Total Gym Leg Press 112# double leg slow eccentrics 3X10 and R only 50# slow eccentrics 2X 10 each      Knee/Hip Exercises: Standing   Lateral Step Up Right;15 reps;Hand Hold: 1;Step Height: 6"    Forward Step Up Right;15 reps;Hand Hold: 1;Step Height: 6"                       PT Short Term Goals - 06/17/21 1617       PT SHORT TERM GOAL #1   Title independent with HEP    Time 4    Period Weeks    Status Achieved    Target Date 07/06/21      PT SHORT TERM GOAL #2   Title perform 5 times sit to stand less than 16 seconds    Time 4    Period Weeks    Status On-going    Target Date 07/06/21               PT Long Term Goals - 07/08/21 1633       PT LONG TERM GOAL #1   Title Pt will improve FOTO score to 52% functional    Baseline now 80%    Time 6    Period Weeks  Status Achieved      PT LONG TERM GOAL #2   Title report pain < 3/10 with activity for improved mobility    Baseline denies  pain    Time 6    Period Weeks    Status Achieved      PT LONG TERM GOAL #3   Title She will improve Rt knee PROM 0-120 deg to improve function    Baseline 0-125 PROM, 1-121 AROM    Time 6    Period Weeks    Status Achieved      PT LONG TERM GOAL #4   Title Pt will improve Rt knee strength to 5/5 MMT to improve functional strength    Baseline now 5/5    Time 6    Period Weeks    Status Achieved      PT LONG TERM GOAL #5   Title Pt will improve 5 times sit to stand test and TUG test to 13 seconds or less without AD to show improved balance and gait speed    Baseline now 13 sec for both    Time 6    Period Weeks    Status Achieved                   Plan - 07/08/21 1628     Clinical Impression Statement She has now met all PT goals and will be discharged. She is very pleased with her functional level and had no further quesitons or concerns.    Personal Factors and Comorbidities Comorbidity 3+    Comorbidities PMH: vertigo,lumbar DDD,osteoporosis, breast ca treated with chemo and mastectomy,    Examination-Activity Limitations Bend;Lift;Stand;Stairs;Squat;Sleep;Locomotion Level    Examination-Participation Restrictions Cleaning;Driving;Community Activity;Laundry;Shop    Stability/Clinical Decision Making Evolving/Moderate complexity    Rehab Potential Good    PT Frequency 2x / week    PT Duration 6 weeks    PT Treatment/Interventions Cryotherapy;Electrical Stimulation;Traction;Ultrasound;Iontophoresis 70m/ml Dexamethasone;Moist Heat;Gait training;Therapeutic activities;Therapeutic exercise;Neuromuscular re-education;Stair training;Patient/family education;Manual techniques;Joint Manipulations;Dry needling;Passive range of motion;Taping;Vasopneumatic Device    PT Next Visit Plan DC today    PT Home Exercise Plan Access Code: [email or web address]    Consulted and Agree with Plan of Care Patient             Patient will benefit from skilled therapeutic intervention in order  to improve the following deficits and impairments:  Decreased activity tolerance, Decreased balance, Decreased endurance, Decreased mobility, Decreased range of motion, Decreased strength, Difficulty walking, Increased edema, Impaired flexibility, Pain  Visit Diagnosis: Difficulty in walking, not elsewhere classified  Muscle weakness (generalized)  Localized edema  Stiffness of right knee, not elsewhere classified  Acute pain of right knee     Problem List Patient Active Problem List   Diagnosis Date Noted   Hx of total knee arthroplasty, right 05/21/2021   Genetic testing 06/07/2018   History of breast cancer 04/19/2018   Wears hearing aid    Wears glasses    Vaginal vault prolapse    Hiatal hernia    BPPV (benign paroxysmal positional vertigo)    Palpitations 10/31/2016   Radicular pain of right lower back 04/06/2015   Compression fracture of L3 lumbar vertebra 04/06/2015   Senile osteoporosis 04/06/2015   DDD (degenerative disc disease), lumbar 04/06/2015   Other malaise and fatigue 01/17/2014   Hx of adenomatous colonic polyps 07/11/2013   Hyperlipidemia LDL goal <100    Bladder prolapse, female, acquired    Esophageal reflux 10/12/2011    BDebbe Odea  PT,DPT 07/08/2021, 4:34 PM  [facility] Physical Therapy [address] [place], [place], [address] Phone: [phone]   Fax:  [phone]  Name: Rebecca Guzman MRN: [id number] Date of Birth: Jul 12, 1934

## 2021-07-20 ENCOUNTER — Encounter: Payer: Self-pay | Admitting: Physical Medicine and Rehabilitation

## 2021-07-20 ENCOUNTER — Ambulatory Visit: Payer: Self-pay

## 2021-07-20 ENCOUNTER — Other Ambulatory Visit: Payer: Self-pay

## 2021-07-20 ENCOUNTER — Ambulatory Visit: Payer: Medicare PPO | Admitting: Physical Medicine and Rehabilitation

## 2021-07-20 VITALS — BP 138/80 | HR 97

## 2021-07-20 DIAGNOSIS — M5416 Radiculopathy, lumbar region: Secondary | ICD-10-CM

## 2021-07-20 DIAGNOSIS — M48062 Spinal stenosis, lumbar region with neurogenic claudication: Secondary | ICD-10-CM

## 2021-07-20 MED ORDER — METHYLPREDNISOLONE ACETATE 80 MG/ML IJ SUSP
80.0000 mg | Freq: Once | INTRAMUSCULAR | Status: AC
  Administered 2021-07-20: 16:00:00 80 mg

## 2021-07-20 NOTE — Progress Notes (Signed)
Pt lower back pain. Pt state walking, bending and lifting makes the pain worse. Pt state she takes over the counter pan meds and uses heat to help ease her pain.  Numeric Pain Rating Scale and Functional Assessment Average Pain 8   In the last MONTH (on 0-10 scale) has pain interfered with the following?  1. General activity like being  able to carry out your everyday physical activities such as walking, climbing stairs, carrying groceries, or moving a chair?  Rating(10)   +Driver, -BT, -Dye Allergies.

## 2021-07-20 NOTE — Patient Instructions (Signed)

## 2021-07-21 ENCOUNTER — Telehealth: Payer: Self-pay | Admitting: Physical Medicine and Rehabilitation

## 2021-07-21 NOTE — Telephone Encounter (Signed)
Pt called stating she needs to r/s her 08/10/21 appt, she won't have a driver.   [phone]

## 2021-07-23 ENCOUNTER — Other Ambulatory Visit: Payer: Self-pay

## 2021-07-23 ENCOUNTER — Ambulatory Visit: Payer: Medicare PPO | Admitting: Orthopaedic Surgery

## 2021-07-23 ENCOUNTER — Ambulatory Visit (INDEPENDENT_AMBULATORY_CARE_PROVIDER_SITE_OTHER): Payer: Medicare PPO | Admitting: Orthopaedic Surgery

## 2021-07-23 VITALS — BP 151/84 | HR 84 | Wt 167.0 lb

## 2021-07-23 DIAGNOSIS — Z96651 Presence of right artificial knee joint: Secondary | ICD-10-CM

## 2021-07-25 NOTE — Progress Notes (Signed)
Post-Op Visit Note   Patient: Rebecca Guzman           Date of Birth: July 23, 1934           MRN: [id number] Visit Date: 07/23/2021 PCP: Hoyt Koch, MD   Assessment & Plan: 87 year old female post right total knee arthroplasty 2 months.  She is already flexion to 120 has lack of only 2 to 3 degrees full extension.  Good quad strength she is ambulatory without limping.  Chief Complaint:  Chief Complaint  Patient presents with   Right Knee - Follow-up    05/21/2021 Right TKA   Visit Diagnoses:  1. Hx of total knee arthroplasty, right     Plan: Post total knee arthroplasty right doing great with strength and flexion.  She will continue with strengthening exercises she is back to walking normally in the community is very happy the surgical result return as needed.  Follow-Up Instructions: No follow-ups on file.   Orders:  No orders of the defined types were placed in this encounter.  No orders of the defined types were placed in this encounter.   Imaging: No results found.  PMFS History: Patient Active Problem List   Diagnosis Date Noted   Hx of total knee arthroplasty, right 05/21/2021   Genetic testing 06/07/2018   History of breast cancer 04/19/2018   Wears hearing aid    Wears glasses    Vaginal vault prolapse    Hiatal hernia    BPPV (benign paroxysmal positional vertigo)    Palpitations 10/31/2016   Radicular pain of right lower back 04/06/2015   Compression fracture of L3 lumbar vertebra 04/06/2015   Senile osteoporosis 04/06/2015   DDD (degenerative disc disease), lumbar 04/06/2015   Other malaise and fatigue 01/17/2014   Hx of adenomatous colonic polyps 07/11/2013   Hyperlipidemia LDL goal <100    Bladder prolapse, female, acquired    Esophageal reflux 10/12/2011   Past Medical History:  Diagnosis Date   Asthma    BPPV (benign paroxysmal positional vertigo)    Breast cancer (Re[place] Gu[place])    left mastectomy 1992, right lumpectomy 2019   Cancer  ([place])    Compression fracture of L3 lumbar vertebra 04/06/2015   Dysrhythmia 2019   pt states Cardiologist told her it is "pre A-fib"   Family history of breast cancer    Family history of colon cancer    Family history of pancreatic cancer    GERD (gastroesophageal reflux disease)    Hiatal hernia    pt states she doesnt have this   History of adenomatous polyp of colon    tubular adenoma 2005 and  2014 tubular adenoma and  hyperplastic polyp   History of breast cancer per pt no recurrence   dx 1992 --  s/p  left breast mastectomy and chemotherapy   Hyperlipidemia LDL goal <100    Osteoarthritis of right knee    Personal history of chemotherapy 1992   PONV (postoperative nausea and vomiting)    and "gets weak and light-leaded"   Sensation of pressure in bladder area    Vaginal vault prolapse    anterior   Wears glasses    Wears hearing aid    left only    Family History  Problem Relation Age of Onset   Heart attack Mother    Colon cancer Mother 43   Hypertension Mother    Heart disease Father    Breast cancer Sister  pat half sister dx in her 64s   Pancreatic cancer Other 106    Past Surgical History:  Procedure Laterality Date   BREAST BIOPSY Right 04/19/2018   BREAST LUMPECTOMY Right 05/17/2018   BREAST LUMPECTOMY WITH RADIOACTIVE SEED LOCALIZATION Right 05/17/2018   Procedure: RIGHT BREAST LUMPECTOMY WITH RADIOACTIVE SEED LOCALIZATION;  Surgeon: [name], MD;  Location: [place];  Service: General;  Laterality: Right;   CATARACT EXTRACTION W/ INTRAOCULAR LENS  IMPLANT, BILATERAL  2014   CHOLECYSTECTOMY  1985   COLONOSCOPY  last one 06-04-2013   CYSTOCELE REPAIR N/A 02/29/2016   Procedure: ANTERIOR VAULT PROLAPSE REPAIR COLOPLAST [place] FIXATION AUGMENTED AXIS DERMIS REPAIR  ;  Surgeon: Carolan Clines, MD;  Location: [place];  Service: Urology;  Laterality: N/A;   ESOPHAGOGASTRODUODENOSCOPY  08-31-2009   LAPAROSCOPIC ASSISTED  VAGINAL HYSTERECTOMY  01-10-2000   w/ Bilateral Salpingoophorectomy /  Anterior & Posterior repair/  Pubovaginal Sling   MASTECTOMY Left 1992   TOTAL KNEE ARTHROPLASTY Right 05/21/2021   Procedure: RIGHT TOTAL KNEE ARTHROPLASTY;  Surgeon: Marybelle Killings, MD;  Location: [place];  Service: Orthopedics;  Laterality: Right;   Social History   Occupational History   Occupation: Retired Product manager: RETIRED  Tobacco Use   Smoking status: Former    Years: 13.00    Types: Cigarettes    Quit date: 10/12/1983    Years since quitting: 37.8   Smokeless tobacco: Never  Vaping Use   Vaping Use: Never used  Substance and Sexual Activity   Alcohol use: No   Drug use: No   Sexual activity: Not on file

## 2021-07-27 NOTE — Procedures (Signed)
Lumbar Epidural Steroid Injection - Interlaminar Approach with Fluoroscopic Guidance  Patient: Rebecca Guzman      Date of Birth: 01-Mar-1934 MRN: [id number] PCP: Hoyt Koch, MD      Visit Date: 07/20/2021   Universal Protocol:     Consent Given By: the patient  Position: PRONE  Additional Comments: Vital signs were monitored before and after the procedure. Patient was prepped and draped in the usual sterile fashion. The correct patient, procedure, and site was verified.   Injection Procedure Details:   Procedure diagnoses: Lumbar radiculopathy [M54.16]   Meds Administered:  Meds ordered this encounter  Medications   methylPREDNISolone acetate (DEPO-MEDROL) injection 80 mg     Laterality: Right  Location/Site:  L5-S1  Needle: 3.5 in., 20 ga. Tuohy  Needle Placement: Paramedian epidural  Findings:   -Comments: Excellent flow of contrast into the epidural space.  Procedure Details: Using a paramedian approach from the side mentioned above, the region overlying the inferior lamina was localized under fluoroscopic visualization and the soft tissues overlying this structure were infiltrated with 4 ml. of 1% Lidocaine without Epinephrine. The Tuohy needle was inserted into the epidural space using a paramedian approach.   The epidural space was localized using loss of resistance along with counter oblique bi-planar fluoroscopic views.  After negative aspirate for air, blood, and CSF, a 2 ml. volume of Isovue-250 was injected into the epidural space and the flow of contrast was observed. Radiographs were obtained for documentation purposes.    The injectate was administered into the level noted above.   Additional Comments:  The patient tolerated the procedure well Dressing: 2 x 2 sterile gauze and Band-Aid    Post-procedure details: Patient was observed during the procedure. Post-procedure instructions were reviewed.  Patient left the clinic in stable  condition.

## 2021-07-27 NOTE — Progress Notes (Signed)
Rebecca Guzman - 87 y.o. female MRN [id number]  Date of birth: 1933-10-27  Office Visit Note: Visit Date: 07/20/2021 PCP: Hoyt Koch, MD Referred by: Hoyt Koch, *  Subjective: Chief Complaint  Patient presents with   Lower Back - Pain   HPI:  Rebecca Guzman is a 87 y.o. female who comes in today at the request of [name] for planned Right L5-S1 Lumbar Interlaminar epidural steroid injection with fluoroscopic guidance.  The patient has failed conservative care including home exercise, medications, time and activity modification.  This injection will be diagnostic and hopefully therapeutic.  Please see requesting physician notes for further details and justification.  ROS Otherwise per HPI.  Assessment & Plan: Visit Diagnoses:    ICD-10-CM   1. Lumbar radiculopathy  M54.16 XR C-ARM NO REPORT    Epidural Steroid injection    methylPREDNISolone acetate (DEPO-MEDROL) injection 80 mg    2. Spinal stenosis of lumbar region with neurogenic claudication  M48.062       Plan: No additional findings.   Meds & Orders:  Meds ordered this encounter  Medications   methylPREDNISolone acetate (DEPO-MEDROL) injection 80 mg    Orders Placed This Encounter  Procedures   XR C-ARM NO REPORT   Epidural Steroid injection    Follow-up: Return for visit to requesting provider as needed.   Procedures: No procedures performed  Lumbar Epidural Steroid Injection - Interlaminar Approach with Fluoroscopic Guidance  Patient: Rebecca Guzman      Date of Birth: [birth year] MRN: [id number] PCP: Hoyt Koch, MD      Visit Date: 07/20/2021   Universal Protocol:     Consent Given By: the patient  Position: PRONE  Additional Comments: Vital signs were monitored before and after the procedure. Patient was prepped and draped in the usual sterile fashion. The correct patient, procedure, and site was verified.   Injection Procedure Details:   Procedure  diagnoses: Lumbar radiculopathy [M54.16]   Meds Administered:  Meds ordered this encounter  Medications   methylPREDNISolone acetate (DEPO-MEDROL) injection 80 mg     Laterality: Right  Location/Site:  L5-S1  Needle: 3.5 in., 20 ga. Tuohy  Needle Placement: Paramedian epidural  Findings:   -Comments: Excellent flow of contrast into the epidural space.  Procedure Details: Using a paramedian approach from the side mentioned above, the region overlying the inferior lamina was localized under fluoroscopic visualization and the soft tissues overlying this structure were infiltrated with 4 ml. of 1% Lidocaine without Epinephrine. The Tuohy needle was inserted into the epidural space using a paramedian approach.   The epidural space was localized using loss of resistance along with counter oblique bi-planar fluoroscopic views.  After negative aspirate for air, blood, and CSF, a 2 ml. volume of Isovue-250 was injected into the epidural space and the flow of contrast was observed. Radiographs were obtained for documentation purposes.    The injectate was administered into the level noted above.   Additional Comments:  The patient tolerated the procedure well Dressing: 2 x 2 sterile gauze and Band-Aid    Post-procedure details: Patient was observed during the procedure. Post-procedure instructions were reviewed.  Patient left the clinic in stable condition.   Clinical History: MRI LUMBAR SPINE WITHOUT CONTRAST   TECHNIQUE: Multiplanar, multisequence MR imaging of the lumbar spine was performed. No intravenous contrast was administered.   COMPARISON:  Lumbar spine radiographs 04/06/2015   FINDINGS: Moderate lumbar levoscoliosis is again seen with apex at L2-3.  There is trace retrolisthesis of L1 on L2 and L2 on L3 and trace anterolisthesis of L4 on L5. There is at most minimal right-sided vertebral body height loss at L3 which is likely related to the scoliosis. No frank  compression fracture or marrow edema suggestive of recent osseous injury is identified.   There is minimal right-sided degenerative endplate edema at Z6-1 and L3-4 associated with asymmetric right-sided disc space height loss at these levels. There is also mild-to-moderate disc space narrowing at L1-2 and L5-S1. Disc desiccation is present throughout the lumbar spine.   The conus medullaris is normal in signal and terminates at L1. There is slight fullness of the renal collecting systems and ureters bilaterally, incompletely evaluated but may be physiologic given partially visualized mild bladder distention.   L1-2: Mild disc bulging and endplate spurring result in minimal right lateral recess narrowing without spinal canal or neural foraminal stenosis.   L2-3: Mild disc bulging, endplate spurring, and asymmetric right facet arthrosis result in minimal right lateral recess and minimal right neural foraminal stenosis without spinal stenosis.   L3-4: Disc bulging asymmetric to the right and moderate right and mild left facet arthrosis result in moderate right and mild left lateral recess stenosis and moderate to severe right neural foraminal stenosis without spinal stenosis. There is the potential for the right-sided L3 and/or L4 nerve root impingement.   L4-5: Listhesis with disc uncovering and advanced left greater than right facet arthrosis result in moderate spinal stenosis, mild right and moderate left lateral recess stenosis, and at most minimal bilateral neural foraminal stenosis.   L5-S1: Disc bulging, small left paracentral disc extrusion with mild inferior migration in the lateral recess, and mild right and severe left facet arthrosis result in mild right and moderate to severe left lateral recess stenosis and mild left neural foraminal stenosis without spinal stenosis.   IMPRESSION: 1. Advanced L4-5 facet arthrosis with grade 1 anterolisthesis, moderate spinal  stenosis, and left greater than right lateral recess stenosis. 2. Moderate right lateral recess stenosis and moderate to severe right neural foraminal stenosis at L3-4. 3. Moderate lumbar levoscoliosis.     Electronically Signed   By: Logan Bores M.D.   On: 10/15/2015 08:42     Objective:  VS:  HT:     WT:    BMI:      BP:138/80   HR:97bpm   TEMP: ( )   RESP:  Physical Exam Vitals and nursing note reviewed.  Constitutional:      General: She is not in acute distress.    Appearance: Normal appearance. She is not ill-appearing.  HENT:     Head: Normocephalic and atraumatic.     Right Ear: External ear normal.     Left Ear: External ear normal.  Eyes:     Extraocular Movements: Extraocular movements intact.  Cardiovascular:     Rate and Rhythm: Normal rate.     Pulses: Normal pulses.  Pulmonary:     Effort: Pulmonary effort is normal. No respiratory distress.  Abdominal:     General: There is no distension.     Palpations: Abdomen is soft.  Musculoskeletal:        General: Tenderness present.     Cervical back: Neck supple.     Right lower leg: No edema.     Left lower leg: No edema.     Comments: Patient has good distal strength with no pain over the greater trochanters.  No clonus or focal weakness.  Skin:  Findings: No erythema, lesion or rash.  Neurological:     General: No focal deficit present.     Mental Status: She is alert and oriented to person, place, and time.     Sensory: No sensory deficit.     Motor: No weakness or abnormal muscle tone.     Coordination: Coordination normal.  Psychiatric:        Mood and Affect: Mood normal.        Behavior: Behavior normal.     Imaging: No results found.

## 2021-08-03 DIAGNOSIS — N390 Urinary tract infection, site not specified: Secondary | ICD-10-CM | POA: Diagnosis not present

## 2021-08-10 ENCOUNTER — Ambulatory Visit: Payer: Medicare PPO | Admitting: Physical Medicine and Rehabilitation

## 2021-08-11 DIAGNOSIS — N8182 Incompetence or weakening of pubocervical tissue: Secondary | ICD-10-CM | POA: Diagnosis not present

## 2021-08-11 DIAGNOSIS — N390 Urinary tract infection, site not specified: Secondary | ICD-10-CM | POA: Diagnosis not present

## 2021-08-16 ENCOUNTER — Ambulatory Visit: Payer: Medicare PPO | Admitting: Physical Medicine and Rehabilitation

## 2021-08-16 ENCOUNTER — Encounter: Payer: Self-pay | Admitting: Physical Medicine and Rehabilitation

## 2021-08-16 ENCOUNTER — Other Ambulatory Visit: Payer: Self-pay

## 2021-08-16 VITALS — BP 152/78 | HR 90

## 2021-08-16 DIAGNOSIS — M5416 Radiculopathy, lumbar region: Secondary | ICD-10-CM

## 2021-08-16 DIAGNOSIS — M48062 Spinal stenosis, lumbar region with neurogenic claudication: Secondary | ICD-10-CM | POA: Diagnosis not present

## 2021-08-16 DIAGNOSIS — M47816 Spondylosis without myelopathy or radiculopathy, lumbar region: Secondary | ICD-10-CM

## 2021-08-16 DIAGNOSIS — M5116 Intervertebral disc disorders with radiculopathy, lumbar region: Secondary | ICD-10-CM | POA: Diagnosis not present

## 2021-08-16 NOTE — Progress Notes (Signed)
Rebecca Guzman - 88 y.o. female MRN [id number]  Date of birth: 01-23-1934  Office Visit Note: Visit Date: 08/16/2021 PCP: Hoyt Koch, MD Referred by: Hoyt Koch, *  Subjective: Chief Complaint  Patient presents with   Lower Back - Follow-up   HPI: [name] is a 88 y.o. female who comes in today for evaluation of chronic, worsening and severe left sided lower back pain with intermittent radiation to left leg. Patient recently had right L5-S1 interlaminar epidural steroid injection on 07/20/2021 and reports significant and sustained relief, however left sided symptoms remain. Patient reports pain is exacerbated by walking, activity and prolonged standing. She describes pain as constant soreness sensation, currently rates as 8 out of 10. Patient reports some relief of pain with rest and Tylenol as needed. Patient recently completed regimen of formal physical therapy with our in house team and reports this gave her some short term relief of pain. Patient's lumbar MRI from 2017 exhibits moderate lumbar levoscoliosis, advanced facet arthrosis with grade 1 anterolisthesis and moderate spinal canal stenosis at L4-L5. There is also small left paracentral disc extrusion at L5-S1. No high grade spinal canal stenosis noted. Patient states her severe pain makes it difficult to complete daily tasks. Patient had right total knee arthroscopy in October 2022 by [name], states she is doing well and currently denies any knee problems. Patient denies focal weakness, numbness and tingling. Patient denies recent trauma or falls.   Review of Systems  Musculoskeletal:  Positive for back pain.  Neurological:  Negative for tingling, sensory change, focal weakness and weakness.  All other systems reviewed and are negative. Otherwise per HPI.  Assessment & Plan: Visit Diagnoses:    ICD-10-CM   1. Lumbar radiculopathy  M54.16 Ambulatory referral to Physical Medicine Rehab    2.  Intervertebral disc disorders with radiculopathy, lumbar region  M51.16     3. Spinal stenosis of lumbar region with neurogenic claudication  M48.062     4. Facet hypertrophy of lumbar region  M47.816        Plan: Findings:  Chronic, worsening and severe left sided lower back pain with intermittent radiation to leg. Patient continues to have excruciating and debilitating pain despite good conservative therapies such as formal physical therapy, rest and use of medications. Patient's clinical presentation and exam are consistent with left L5 nerve pattern. We believe the next step is to perform diagnostic and hopefully therapeutic left L5-S1 interlaminar epidural steroid injection under fluoroscopic guidance. Patient is not currently on long term anticoagulation therapy. If patient does not get good relief with epidural injection we would consider obtaining new lumbar MRI images. Patient encouraged to remain active and to take 500 mg Tylenol QID as needed for pain. We will have her follow up in 2 weeks to reassess pain and discuss further treatments. No red flag symptoms noted upon exam today.    Meds & Orders: No orders of the defined types were placed in this encounter.   Orders Placed This Encounter  Procedures   Ambulatory referral to Physical Medicine Rehab    Follow-up: Return for Left L5-S1 interlaminar epidural steroid injection.   Procedures: No procedures performed      Clinical History: MRI LUMBAR SPINE WITHOUT CONTRAST   TECHNIQUE: Multiplanar, multisequence MR imaging of the lumbar spine was performed. No intravenous contrast was administered.   COMPARISON:  Lumbar spine radiographs 04/06/2015   FINDINGS: Moderate lumbar levoscoliosis is again seen with apex at L2-3. There  is trace retrolisthesis of L1 on L2 and L2 on L3 and trace anterolisthesis of L4 on L5. There is at most minimal right-sided vertebral body height loss at L3 which is likely related to the scoliosis.  No frank compression fracture or marrow edema suggestive of recent osseous injury is identified.   There is minimal right-sided degenerative endplate edema at W2-5 and L3-4 associated with asymmetric right-sided disc space height loss at these levels. There is also mild-to-moderate disc space narrowing at L1-2 and L5-S1. Disc desiccation is present throughout the lumbar spine.   The conus medullaris is normal in signal and terminates at L1. There is slight fullness of the renal collecting systems and ureters bilaterally, incompletely evaluated but may be physiologic given partially visualized mild bladder distention.   L1-2: Mild disc bulging and endplate spurring result in minimal right lateral recess narrowing without spinal canal or neural foraminal stenosis.   L2-3: Mild disc bulging, endplate spurring, and asymmetric right facet arthrosis result in minimal right lateral recess and minimal right neural foraminal stenosis without spinal stenosis.   L3-4: Disc bulging asymmetric to the right and moderate right and mild left facet arthrosis result in moderate right and mild left lateral recess stenosis and moderate to severe right neural foraminal stenosis without spinal stenosis. There is the potential for the right-sided L3 and/or L4 nerve root impingement.   L4-5: Listhesis with disc uncovering and advanced left greater than right facet arthrosis result in moderate spinal stenosis, mild right and moderate left lateral recess stenosis, and at most minimal bilateral neural foraminal stenosis.   L5-S1: Disc bulging, small left paracentral disc extrusion with mild inferior migration in the lateral recess, and mild right and severe left facet arthrosis result in mild right and moderate to severe left lateral recess stenosis and mild left neural foraminal stenosis without spinal stenosis.   IMPRESSION: 1. Advanced L4-5 facet arthrosis with grade 1 anterolisthesis, moderate  spinal stenosis, and left greater than right lateral recess stenosis. 2. Moderate right lateral recess stenosis and moderate to severe right neural foraminal stenosis at L3-4. 3. Moderate lumbar levoscoliosis.     Electronically Signed   By: Logan Bores M.D.   On: 10/15/2015 08:42   She reports that she quit smoking about 37 years ago. Her smoking use included cigarettes. She has never used smokeless tobacco.  Recent Labs    03/29/21 1441  HGBA1C 5.8    Objective:  VS:  HT:     WT:    BMI:      BP: (!) 152/78   HR:90bpm   TEMP: ( )   RESP:  Physical Exam Vitals and nursing note reviewed.  HENT:     Head: Normocephalic and atraumatic.     Right Ear: External ear normal.     Left Ear: External ear normal.     Nose: Nose normal.     Mouth/Throat:     Mouth: Mucous membranes are moist.  Eyes:     Extraocular Movements: Extraocular movements intact.  Cardiovascular:     Rate and Rhythm: Normal rate.     Pulses: Normal pulses.  Pulmonary:     Effort: Pulmonary effort is normal.  Abdominal:     General: Abdomen is flat. There is no distension.  Musculoskeletal:        General: Tenderness present.     Cervical back: Normal range of motion.     Comments: Pt is slow to rise from seated position to standing. Good lumbar range  of motion. Strong distal strength without clonus, no pain upon palpation of greater trochanters. Sensation intact bilaterally. Walks independently, gait steady.   Skin:    General: Skin is warm and dry.     Capillary Refill: Capillary refill takes less than 2 seconds.  Neurological:     General: No focal deficit present.     Mental Status: She is alert and oriented to person, place, and time.  Psychiatric:        Mood and Affect: Mood normal.        Behavior: Behavior normal.    Ortho Exam  Imaging: No results found.  Past Medical/Family/Surgical/Social History: Medications & Allergies reviewed per EMR, new medications updated. Patient Active  Problem List   Diagnosis Date Noted   Hx of total knee arthroplasty, right 05/21/2021   Genetic testing 06/07/2018   History of breast cancer 04/19/2018   Wears hearing aid    Wears glasses    Vaginal vault prolapse    Hiatal hernia    BPPV (benign paroxysmal positional vertigo)    Palpitations 10/31/2016   Radicular pain of right lower back 04/06/2015   Compression fracture of L3 lumbar vertebra 04/06/2015   Senile osteoporosis 04/06/2015   DDD (degenerative disc disease), lumbar 04/06/2015   Other malaise and fatigue 01/17/2014   Hx of adenomatous colonic polyps 07/11/2013   Hyperlipidemia LDL goal <100    Bladder prolapse, female, acquired    Esophageal reflux 10/12/2011   Past Medical History:  Diagnosis Date   Asthma    BPPV (benign paroxysmal positional vertigo)    Breast cancer ([place])    left mastectomy 1992, right lumpectomy 2019   Cancer ([place])    Compression fracture of L3 lumbar vertebra 04/06/2015   Dysrhythmia 2019   pt states Cardiologist told her it is "pre A-fib"   Family history of breast cancer    Family history of colon cancer    Family history of pancreatic cancer    GERD (gastroesophageal reflux disease)    Hiatal hernia    pt states she doesnt have this   History of adenomatous polyp of colon    tubular adenoma 2005 and  2014 tubular adenoma and  hyperplastic polyp   History of breast cancer per pt no recurrence   dx 1992 --  s/p  left breast mastectomy and chemotherapy   Hyperlipidemia LDL goal <100    Osteoarthritis of right knee    Personal history of chemotherapy 1992   PONV (postoperative nausea and vomiting)    and "gets weak and light-leaded"   Sensation of pressure in bladder area    Vaginal vault prolapse    anterior   Wears glasses    Wears hearing aid    left only   Family History  Problem Relation Age of Onset   Heart attack Mother    Colon cancer Mother 35   Hypertension Mother    Heart disease Father    Breast cancer Sister         pat half sister dx in her 66s   Pancreatic cancer Other 16   Past Surgical History:  Procedure Laterality Date   BREAST BIOPSY Right 04/19/2018   BREAST LUMPECTOMY Right 05/17/2018   BREAST LUMPECTOMY WITH RADIOACTIVE SEED LOCALIZATION Right 05/17/2018   Procedure: RIGHT BREAST LUMPECTOMY WITH RADIOACTIVE SEED LOCALIZATION;  Surgeon: [name], MD;  Location: [facility];  Service: General;  Laterality: Right;   CATARACT EXTRACTION W/ INTRAOCULAR LENS  IMPLANT, BILATERAL  2014  CHOLECYSTECTOMY  1985   COLONOSCOPY  last one 06-04-2013   CYSTOCELE REPAIR N/A 02/29/2016   Procedure: ANTERIOR VAULT PROLAPSE REPAIR COLOPLAST SACROSPINUS FIXATION AUGMENTED AXIS DERMIS REPAIR  ;  Surgeon: Carolan Clines, MD;  Location: [facility];  Service: Urology;  Laterality: N/A;   ESOPHAGOGASTRODUODENOSCOPY  08-31-2009   LAPAROSCOPIC ASSISTED VAGINAL HYSTERECTOMY  01-10-2000   w/ Bilateral Salpingoophorectomy /  Anterior & Posterior repair/  Pubovaginal Sling   MASTECTOMY Left 1992   TOTAL KNEE ARTHROPLASTY Right 05/21/2021   Procedure: RIGHT TOTAL KNEE ARTHROPLASTY;  Surgeon: Marybelle Killings, MD;  Location: [facility];  Service: Orthopedics;  Laterality: Right;   Social History   Occupational History   Occupation: Retired Product manager: RETIRED  Tobacco Use   Smoking status: Former    Years: 13.00    Types: Cigarettes    Quit date: 10/12/1983    Years since quitting: 37.8   Smokeless tobacco: Never  Vaping Use   Vaping Use: Never used  Substance and Sexual Activity   Alcohol use: No   Drug use: No   Sexual activity: Not on file

## 2021-08-16 NOTE — Progress Notes (Signed)
Pt has hx of inj on 07/20/21 and pt state it has helped 90% relief.  Numeric Pain Rating Scale and Functional Assessment Average Pain 7   In the last MONTH (on 0-10 scale) has pain interfered with the following?  1. General activity like being  able to carry out your everyday physical activities such as walking, climbing stairs, carrying groceries, or moving a chair?  Rating(9)

## 2021-08-23 ENCOUNTER — Telehealth: Payer: Self-pay

## 2021-08-23 NOTE — Telephone Encounter (Signed)
Labs were just done in august 2022 so I would recommend to come to physical and if more labs are needed they can be done that day.

## 2021-08-23 NOTE — Telephone Encounter (Signed)
Pt is requesting to have blood work done before her physical on 09/20/21.   Please advise.

## 2021-08-23 NOTE — Telephone Encounter (Signed)
Spoke with the pt and she is ok with waiting til the day of her appt to have labs done. No other questions or concerns.

## 2021-09-02 ENCOUNTER — Telehealth: Payer: Self-pay | Admitting: Orthopaedic Surgery

## 2021-09-02 NOTE — Telephone Encounter (Signed)
Rebecca Guzman w/ Dr. Raelyn Ensign office called asking if pt needs any premeds prior to her upcoming dental appt? Pt is s/p knee replacement 05/2021. Please call to inform [name] at [phone]

## 2021-09-02 NOTE — Telephone Encounter (Signed)
Office closed when I tried to call. Could you please call and advise, no premed needed per Dr. Lorin Mercy protocol? Thanks.

## 2021-09-03 NOTE — Telephone Encounter (Signed)
Called and advised.

## 2021-09-06 ENCOUNTER — Telehealth: Payer: Self-pay | Admitting: Orthopaedic Surgery

## 2021-09-06 NOTE — Telephone Encounter (Signed)
Yes. Faxed to [phone].

## 2021-09-06 NOTE — Telephone Encounter (Signed)
I have entered letter and printed to our printer. Could you please fax for me?

## 2021-09-06 NOTE — Telephone Encounter (Signed)
Spoke with Ebony Hail with [facility] needing a note faxed to them stating patient does not need an  antibiotic prior to office visit.  The fax# is [phone]  Ph# is [phone]

## 2021-09-09 DIAGNOSIS — N390 Urinary tract infection, site not specified: Secondary | ICD-10-CM | POA: Diagnosis not present

## 2021-09-09 DIAGNOSIS — N8182 Incompetence or weakening of pubocervical tissue: Secondary | ICD-10-CM | POA: Diagnosis not present

## 2021-09-14 ENCOUNTER — Telehealth: Payer: Self-pay | Admitting: Hematology and Oncology

## 2021-09-14 NOTE — Telephone Encounter (Signed)
Sch per 2/7 inbasket,pt req to r/s, pt aware

## 2021-09-15 ENCOUNTER — Inpatient Hospital Stay: Payer: Medicare PPO | Admitting: Hematology and Oncology

## 2021-09-20 ENCOUNTER — Other Ambulatory Visit: Payer: Self-pay

## 2021-09-20 ENCOUNTER — Ambulatory Visit (INDEPENDENT_AMBULATORY_CARE_PROVIDER_SITE_OTHER): Payer: Medicare PPO | Admitting: Internal Medicine

## 2021-09-20 ENCOUNTER — Encounter: Payer: Self-pay | Admitting: Internal Medicine

## 2021-09-20 VITALS — BP 128/74 | HR 79 | Resp 18 | Ht 65.0 in | Wt 168.8 lb

## 2021-09-20 DIAGNOSIS — Z0000 Encounter for general adult medical examination without abnormal findings: Secondary | ICD-10-CM | POA: Diagnosis not present

## 2021-09-20 DIAGNOSIS — M810 Age-related osteoporosis without current pathological fracture: Secondary | ICD-10-CM

## 2021-09-20 DIAGNOSIS — R5383 Other fatigue: Secondary | ICD-10-CM | POA: Diagnosis not present

## 2021-09-20 DIAGNOSIS — E785 Hyperlipidemia, unspecified: Secondary | ICD-10-CM | POA: Diagnosis not present

## 2021-09-20 DIAGNOSIS — Z96651 Presence of right artificial knee joint: Secondary | ICD-10-CM | POA: Diagnosis not present

## 2021-09-20 DIAGNOSIS — D649 Anemia, unspecified: Secondary | ICD-10-CM | POA: Diagnosis not present

## 2021-09-20 LAB — CBC
HCT: 37.5 % (ref 36.0–46.0)
Hemoglobin: 12.5 g/dL (ref 12.0–15.0)
MCHC: 33.3 g/dL (ref 30.0–36.0)
MCV: 88.6 fl (ref 78.0–100.0)
Platelets: 205 10*3/uL (ref 150.0–400.0)
RBC: 4.23 Mil/uL (ref 3.87–5.11)
RDW: 14.9 % (ref 11.5–15.5)
WBC: 8.5 10*3/uL (ref 4.0–10.5)

## 2021-09-20 LAB — VITAMIN B12: Vitamin B-12: 520 pg/mL (ref 211–911)

## 2021-09-20 LAB — VITAMIN D 25 HYDROXY (VIT D DEFICIENCY, FRACTURES): VITD: 74.73 ng/mL (ref 30.00–100.00)

## 2021-09-20 NOTE — Progress Notes (Signed)
° °  Subjective:   Patient ID: Rebecca Guzman, female    DOB: 01-04-1934, 88 y.o.   MRN: [id number]  HPI The patient is an 88 YO female coming in for physical.  PMH, [place], social history reviewed and updated  Review of Systems  Constitutional: Negative.   HENT: Negative.    Eyes: Negative.   Respiratory:  Negative for cough, chest tightness and shortness of breath.   Cardiovascular:  Negative for chest pain, palpitations and leg swelling.  Gastrointestinal:  Negative for abdominal distention, abdominal pain, constipation, diarrhea, nausea and vomiting.  Musculoskeletal: Negative.   Skin: Negative.   Neurological: Negative.   Psychiatric/Behavioral: Negative.     Objective:  Physical Exam Constitutional:      Appearance: She is well-developed.  HENT:     Head: Normocephalic and atraumatic.  Cardiovascular:     Rate and Rhythm: Normal rate and regular rhythm.  Pulmonary:     Effort: Pulmonary effort is normal. No respiratory distress.     Breath sounds: Normal breath sounds. No wheezing or rales.  Abdominal:     General: Bowel sounds are normal. There is no distension.     Palpations: Abdomen is soft.     Tenderness: There is no abdominal tenderness. There is no rebound.  Musculoskeletal:        General: Tenderness present.     Cervical back: Normal range of motion.  Skin:    General: Skin is warm and dry.  Neurological:     Mental Status: She is alert and oriented to person, place, and time.     Coordination: Coordination normal.    Vitals:   09/20/21 1314  BP: 128/74  Pulse: 79  Resp: 18  SpO2: 96%  Weight: 168 lb 12.8 oz (76.6 kg)  Height: 5\' 5"  (1.651 m)    This visit occurred during the SARS-CoV-2 public health emergency.  Safety protocols were in place, including screening questions prior to the visit, additional usage of staff PPE, and extensive cleaning of exam room while observing appropriate contact time as indicated for disinfecting solutions.    Assessment & Plan:

## 2021-09-20 NOTE — Patient Instructions (Signed)
Think about the covid-19 booster shot.

## 2021-09-21 DIAGNOSIS — Z0000 Encounter for general adult medical examination without abnormal findings: Secondary | ICD-10-CM | POA: Insufficient documentation

## 2021-09-21 DIAGNOSIS — D649 Anemia, unspecified: Secondary | ICD-10-CM | POA: Insufficient documentation

## 2021-09-21 NOTE — Assessment & Plan Note (Signed)
Reviewed recent labs and at goal on crestor 5 mg 4 times a week.

## 2021-09-21 NOTE — Assessment & Plan Note (Signed)
Checking vitamin D and we will continue prolia every 6 months lifelong.

## 2021-09-21 NOTE — Assessment & Plan Note (Signed)
Flu shot up to date. Covid-19 counseled booster. Pneumonia complete. Shingrix complete. Tetanus due 2026. Colonoscopy aged out. Mammogram aged out, pap smear aged out and dexa due 2023-2025. Counseled about sun safety and mole surveillance. Counseled about the dangers of distracted driving. Given 10 year screening recommendations.

## 2021-09-21 NOTE — Assessment & Plan Note (Signed)
Hg down as expected after knee replacement. She is having some fatigue so needs follow up CBC and vitamin D and B12 checked today which are ordered. Adjust as needed.

## 2021-09-21 NOTE — Assessment & Plan Note (Signed)
Doing well overall and mobility is good.

## 2021-09-21 NOTE — Assessment & Plan Note (Signed)
Checking vitamin D and B12 today.

## 2021-10-08 DIAGNOSIS — H353131 Nonexudative age-related macular degeneration, bilateral, early dry stage: Secondary | ICD-10-CM | POA: Diagnosis not present

## 2021-10-08 DIAGNOSIS — H26493 Other secondary cataract, bilateral: Secondary | ICD-10-CM | POA: Diagnosis not present

## 2021-10-11 DIAGNOSIS — N390 Urinary tract infection, site not specified: Secondary | ICD-10-CM | POA: Diagnosis not present

## 2021-10-11 DIAGNOSIS — B9620 Unspecified Escherichia coli [E. coli] as the cause of diseases classified elsewhere: Secondary | ICD-10-CM | POA: Diagnosis not present

## 2021-10-14 NOTE — Progress Notes (Signed)
? ?Patient Care Team: ?Hoyt Koch, MD as PCP - General (Internal Medicine) ?[name], MD as PCP - Cardiology (Cardiology) ?[name], MD as Consulting Physician (General Surgery) ?[name], MD as Consulting Physician (Hematology and Oncology) ?[name], MD as Consulting Physician (Radiation Oncology) ?Gardenia Phlegm, NP as Nurse Practitioner (Hematology and Oncology) ? ?DIAGNOSIS:  ?Encounter Diagnosis  ?Name Primary?  ? History of breast cancer   ? ? 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 ?  ?History of breast cancer  ?1992 Miscellaneous  ? Left breast cancer treated with mastectomy followed by adjuvant chemotherapy and 5 years of tamoxifen ?  ?04/17/2018 Initial Diagnosis  ? Screening detected right breast mass, by ultrasound measured 5 mm UIQ middle depth, biopsy revealed grade 1 IDC with DCIS, ER 100%, PR 100%, HER-2 -1+ by IHC, T1 a N0 stage I a clinical stage ?  ?05/17/2018 Surgery  ? Right lumpectomy: IDC, 5.5 mm, grade 1, margins negative, ER 100%, PR 100%, HER-2 1+ by IHC, Ki-67 less than 1%, T1BNX stage Ia ?  ?05/28/2018 -  Anti-estrogen oral therapy  ? Letrozole daily discontinued 03/04/19, switched to anastrozole 03/23/2019, switched to tamoxifen 06/04/19 ?  ?05/30/2018 Cancer Staging  ? Staging form: Breast, AJCC 8th Edition ?- Pathologic: Stage IA (pT1b, pN0, cM0, G1, ER+, PR+, HER2-) - Signed by Gardenia Phlegm, NP on 05/30/2018 ? ?  ?06/05/2018 Genetic Testing  ? 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  The Multi-Gene Panel offered by [facility] includes sequencing and/or deletion duplication testing of the following 84 genes: AIP, ALK, APC, ATM, AXIN2,BAP1,  BARD1, BLM, BMPR1A, BRCA1, BRCA2, BRIP1, CASR, CDC73, CDH1, CDK4, CDKN1B, CDKN1C, CDKN2A (p14ARF), CDKN2A (p16INK4a), CEBPA, CHEK2, CTNNA1, DICER1, DIS3L2, EGFR (c.2369C>T, p.Thr790Met variant only), EPCAM  (Deletion/duplication testing only), FH, FLCN, GATA2, GPC3, GREM1 (Promoter region deletion/duplication testing only), HOXB13 (c.251G>A, p.Gly84Glu), HRAS, KIT, MAX, MEN1, MET, MITF (c.952G>A, p.Glu318Lys variant only), MLH1, MSH2, MSH3, MSH6, MUTYH, NBN, NF1, NF2, NTHL1, PALB2, PDGFRA, PHOX2B, PMS2, POLD1, POLE, POT1, PRKAR1A, PTCH1, PTEN, RAD50, RAD51C, RAD51D, RB1, RECQL4, RET, RUNX1, SDHAF2, SDHA (sequence changes only), SDHB, SDHC, SDHD, SMAD4, SMARCA4, SMARCB1, SMARCE1, STK11, SUFU, TERC, TERT, TMEM127, TP53, TSC1, TSC2, VHL, WRN and WT1.  The report date is 06/05/2018. ?  ? ? ?CHIEF COMPLIANT:  Follow-up of right breast cancer on tamoxifen ? ?INTERVAL HISTORY: Rebecca Guzman is a 88 y.o. with above-mentioned history of right breast cancer who underwent a lumpectomy and is currently on anti-estrogen therapy with tamoxifen after she could not tolerate letrozole or anastrozole. Mammogram on 04/09/20 showed no evidence of malignancy bilaterally. She presents to the clinic today for follow-up.  She reports no new problems or concerns.  Denies any pain lumps or nodules in the breast.  She had a knee replacement surgery and is recovering and doing her very well from that standpoint.  Since her tamoxifen stopped most of the side effects from tamoxifen have resolved. ? ? ?ALLERGIES:  is allergic to codeine and morphine and related. ? ?MEDICATIONS:  ?Current Outpatient Medications  ?Medication Sig Dispense Refill  ? amoxicillin-clavulanate (AUGMENTIN) 500-125 MG tablet Take 1 tablet by mouth in the morning, at noon, and at bedtime.    ? aspirin (BAYER ASPIRIN) 325 MG tablet Take 1 tablet (325 mg total) by mouth daily. Take one regular aspirin tablet for one month to help prevent blood clots then you may go back on your baby aspirin  and stop  the regular aspirin    ? Calcium Carbonate-Vit D-Min (CALCIUM 600+D3 PLUS MINERALS) 600-800 MG-UNIT TABS Take 2 tablets by mouth daily. 180 tablet 3  ? denosumab (PROLIA) 60 MG/ML  SOSY injection Inject 60 mg into the skin every 6 (six) months.    ? fexofenadine (ALLEGRA) 180 MG tablet Take 180 mg by mouth daily.    ? gabapentin (NEURONTIN) 100 MG capsule Take one at night for one week then increase to 2 tablets at bedtime. 60 capsule 1  ? methocarbamol (ROBAXIN) 500 MG tablet Take 1 tablet (500 mg total) by mouth every 8 (eight) hours as needed for muscle spasms. 40 tablet 0  ? metoprolol tartrate (LOPRESSOR) 25 MG tablet TAKE 1 TABLET(25 MG) BY MOUTH TWICE DAILY 180 tablet 3  ? Multiple Vitamin (MULTIVITAMIN) tablet Take 1 tablet by mouth daily.    ? Multiple Vitamins-Minerals (PRESERVISION AREDS 2 PO) Take 1 tablet by mouth daily.    ? Omega-3 Fatty Acids (FISH OIL) 1000 MG CAPS Take 1 capsule (1,000 mg total) by mouth daily.  0  ? omeprazole (PRILOSEC) 40 MG capsule Take 1 capsule (40 mg total) by mouth daily. 90 capsule 3  ? ondansetron (ZOFRAN) 4 MG tablet Take 1 tablet (4 mg total) by mouth daily as needed for nausea or vomiting. 30 tablet 1  ? oxyCODONE-acetaminophen (PERCOCET) 5-325 MG tablet Take 1-2 tablets by mouth every 6 (six) hours as needed for severe pain. 30 tablet 0  ? Probiotic Product (PROBIOTIC FORMULA PO) Take 1 tablet by mouth every evening.     ? rosuvastatin (CRESTOR) 5 MG tablet Take 1 tablet (5 mg total) by mouth 4 (four) times a week. 48 tablet 3  ? ?No current facility-administered medications for this visit.  ? ? ?PHYSICAL EXAMINATION: ?ECOG PERFORMANCE STATUS: 1 - Symptomatic but completely ambulatory ? ?Vitals:  ? 10/18/21 1126  ?BP: (!) 147/69  ?Pulse: 79  ?Resp: 18  ?Temp: (!) 97.3 ?F (36.3 ?C)  ?SpO2: 99%  ? ?Filed Weights  ? 10/18/21 1126  ?Weight: 169 lb 12.8 oz (77 kg)  ? ? ?BREAST: No palpable masses or nodules in either right or left breasts. No palpable axillary supraclavicular or infraclavicular adenopathy no breast tenderness or nipple discharge. (exam performed in the presence of a chaperone) ? ?LABORATORY DATA:  ?I have reviewed the data as  listed ?CMP Latest Ref Rng & Units 05/18/2021 03/29/2021 05/08/2020  ?Glucose 70 - 99 mg/dL 88 86 94  ?BUN 8 - 23 mg/dL _0 ?Creatinine 0.44 - 1.00 mg/dL 0.68 0.80 0.77  ?Sodium 135 - 145 mmol/L 139 137 140  ?Potassium 3.5 - 5.1 mmol/L 3.9 4.4 4.2  ?Chloride 98 - 111 mmol/L 106 103 105  ?CO2 22 - 32 mmol/L _1 ?Calcium 8.9 - 10.3 mg/dL 9.1 9.8 9.4  ?Total Protein 6.5 - 8.1 g/dL 6.6 7.1 6.5  ?Total Bilirubin 0.3 - 1.2 mg/dL 0.7 0.4 0.4  ?Alkaline Phos 38 - 126 U/L 57 57 -  ?AST 15 - 41 U/L _2 ?ALT 0 - 44 U/L _3 ? ? ?Lab Results  ?Component Value Date  ? WBC 8.5 09/20/2021  ? HGB 12.5 09/20/2021  ? HCT 37.5 09/20/2021  ? MCV 88.6 09/20/2021  ? PLT 205.0 09/20/2021  ? NEUTROABS 4,449 05/08/2020  ? ? ?ASSESSMENT & PLAN:  ?History of breast cancer ?05/17/2018: Right lumpectomy: IDC, 5.5 mm, grade 1, margins negative, ER 100%, PR 100%, HER-2 1+ by  IHC, Ki-67 less than 1%, T1BNX stage Ia ?History of left mastectomy 1992 ?We did not recommend adjuvant radiation therapy because of her favorable profile and her age. ?  ?Current treatment: Letrozole 2.5 mg daily started 05/28/2018, switched to anastrozole 03/23/2019 (due to arthralgias and fatigue), switched to tamoxifen 06/04/2019 discontinued 09/15/2020 (tamoxifen related fatigue) ?   ?Breast cancer surveillance:  ?Mammogram right breast: 04/22/2021: Benign breast density category C ?Breast exam 10/18/2021: Benign  ?Bone density in Nov 2021: T score -1.9 Osteopenia: rec Cal + Vit D and walking for exercises ?Return to clinic in  1 year for follow-up. ? ? ? ?No orders of the defined types were placed in this encounter. ? ?The patient has a good understanding of the overall plan. she agrees with it. she will call with any problems that may develop before the next visit here. ?Total time spent: 30 mins including face to face time and time spent for planning, charting and co-ordination of care ? ? Harriette Ohara, MD ?10/18/21 ? ?I, Gardiner Coins is acting  as a Education administrator for Dr. Lindi Adie  ?  ?

## 2021-10-18 ENCOUNTER — Inpatient Hospital Stay: Payer: Medicare PPO | Attending: Hematology and Oncology | Admitting: Hematology and Oncology

## 2021-10-18 ENCOUNTER — Ambulatory Visit (INDEPENDENT_AMBULATORY_CARE_PROVIDER_SITE_OTHER): Payer: Medicare PPO

## 2021-10-18 ENCOUNTER — Other Ambulatory Visit: Payer: Self-pay

## 2021-10-18 VITALS — BP 147/69 | HR 79 | Temp 97.3°F | Resp 18 | Ht 65.0 in | Wt 169.8 lb

## 2021-10-18 DIAGNOSIS — Z9012 Acquired absence of left breast and nipple: Secondary | ICD-10-CM | POA: Diagnosis not present

## 2021-10-18 DIAGNOSIS — Z170 Estrogen receptor positive status [ER+]: Secondary | ICD-10-CM | POA: Diagnosis not present

## 2021-10-18 DIAGNOSIS — M8580 Other specified disorders of bone density and structure, unspecified site: Secondary | ICD-10-CM | POA: Diagnosis not present

## 2021-10-18 DIAGNOSIS — Z79899 Other long term (current) drug therapy: Secondary | ICD-10-CM | POA: Insufficient documentation

## 2021-10-18 DIAGNOSIS — M810 Age-related osteoporosis without current pathological fracture: Secondary | ICD-10-CM | POA: Diagnosis not present

## 2021-10-18 DIAGNOSIS — C50911 Malignant neoplasm of unspecified site of right female breast: Secondary | ICD-10-CM | POA: Diagnosis not present

## 2021-10-18 DIAGNOSIS — M25561 Pain in right knee: Secondary | ICD-10-CM

## 2021-10-18 DIAGNOSIS — Z853 Personal history of malignant neoplasm of breast: Secondary | ICD-10-CM | POA: Diagnosis not present

## 2021-10-18 MED ORDER — DENOSUMAB 60 MG/ML ~~LOC~~ SOSY
60.0000 mg | PREFILLED_SYRINGE | Freq: Once | SUBCUTANEOUS | Status: AC
  Administered 2021-10-18: 60 mg via SUBCUTANEOUS

## 2021-10-18 MED ORDER — ACETAMINOPHEN 500 MG PO TABS: 500.0000 mg | ORAL_TABLET | Freq: Two times a day (BID) | ORAL | 0 refills | Status: DC | PRN

## 2021-10-18 NOTE — Assessment & Plan Note (Signed)
05/17/2018:?Right lumpectomy: IDC, 5.5 mm, grade 1, margins negative, ER 100%, PR 100%, HER-2 1+ by IHC, Ki-67 less than 1%, T1BNX stage Ia ?History of left mastectomy 1992 ?We did not recommend adjuvant radiation therapy because of her favorable profile and her age. ?? ?Current treatment: Letrozole 2.5 mg daily started 05/28/2018, switched to anastrozole 03/23/2019?(due to arthralgias and fatigue), switched to tamoxifen 06/04/2019 discontinued 09/15/2020 (tamoxifen related fatigue) ??? ?Breast cancer surveillance:  ?Mammogram right breast: 04/22/2021: Benign breast density category C ?Breast exam 10/18/2021: Benign? ?Bone density in Nov 2021: T score -1.9 Osteopenia: rec Cal + Vit D and walking for exercises ?Return to clinic in??1 year for follow-up. ?

## 2021-10-18 NOTE — Progress Notes (Cosign Needed)
Pt came in for prolia injection per Dr.Crawford. Pt tolerated injection well.   Next scheduled: 04/21/22

## 2021-12-01 ENCOUNTER — Other Ambulatory Visit: Payer: Self-pay | Admitting: *Deleted

## 2021-12-01 DIAGNOSIS — Z9221 Personal history of antineoplastic chemotherapy: Secondary | ICD-10-CM

## 2021-12-01 DIAGNOSIS — E782 Mixed hyperlipidemia: Secondary | ICD-10-CM

## 2021-12-01 DIAGNOSIS — R002 Palpitations: Secondary | ICD-10-CM

## 2021-12-01 DIAGNOSIS — I10 Essential (primary) hypertension: Secondary | ICD-10-CM

## 2021-12-01 DIAGNOSIS — I491 Atrial premature depolarization: Secondary | ICD-10-CM

## 2021-12-01 MED ORDER — ROSUVASTATIN CALCIUM 5 MG PO TABS: 5.0000 mg | ORAL_TABLET | ORAL | 3 refills | Status: DC

## 2021-12-14 ENCOUNTER — Ambulatory Visit (INDEPENDENT_AMBULATORY_CARE_PROVIDER_SITE_OTHER): Payer: Medicare PPO

## 2021-12-14 DIAGNOSIS — Z0000 Encounter for general adult medical examination without abnormal findings: Secondary | ICD-10-CM | POA: Diagnosis not present

## 2021-12-14 NOTE — Progress Notes (Signed)
?I connected with Rebecca Guzman today by telephone and verified that I am speaking with the correct person using two identifiers. ?Location patient: home ?Location provider: work ?Persons participating in the virtual visit: patient, provider. ?  ?I discussed the limitations, risks, security and privacy concerns of performing an evaluation and management service by telephone and the availability of in person appointments. I also discussed with the patient that there may be a patient responsible charge related to this service. The patient expressed understanding and verbally consented to this telephonic visit.  ?  ?Interactive audio and video telecommunications were attempted between this provider and patient, however failed, due to patient having technical difficulties OR patient did not have access to video capability.  We continued and completed visit with audio only. ? ?Some vital signs may be absent or patient reported.  ? ?Time Spent with patient on telephone encounter: 30 minutes ? ?Subjective:  ? Rebecca Guzman is a 88 y.o. female who presents for Medicare Annual (Subsequent) preventive examination. ? ?Review of Systems    ? ?Cardiac Risk Factors include: advanced age (>14mn, >>76women);family history of premature cardiovascular disease;dyslipidemia ? ?   ?Objective:  ?  ?Today's Vitals  ? 12/14/21 1154  ?PainSc: 5   ? ?There is no height or weight on file to calculate BMI. ? ? ?  12/14/2021  ? 12:09 PM 06/08/2021  ?  3:27 PM 05/21/2021  ?  6:00 PM 05/18/2021  ?  9:19 AM 05/07/2020  ?  8:12 AM 10/31/2019  ? 10:32 AM 05/03/2019  ?  8:48 AM  ?Advanced Directives  ?Does Patient Have a Medical Advance Directive? Yes Yes Yes Yes Yes Yes Yes  ?Type of Advance Directive Living will;Healthcare Power of AVancouverOut of facility DNR (pink MOST or yellow form) HCammack VillageLiving will H[place]Living will HNewport CenterLiving will Out of facility DNR (pink MOST or yellow  form) [place] will;Out of facility DNR (pink MOST or yellow form) Out of facility DNR (pink MOST or yellow form);Healthcare Power of Attorney  ?Does patient want to make changes to medical advance directive? No - Patient declined  No - Patient declined  No - Patient declined No - Patient declined No - Patient declined  ?Copy of [place]in Chart? Yes - validated most recent copy scanned in chart (See row information) Yes - validated most recent copy scanned in chart (See row information) Yes - validated most recent copy scanned in chart (See row information) Yes - validated most recent copy scanned in chart (See row information)  Yes - validated most recent copy scanned in chart (See row information) Yes - validated most recent copy scanned in chart (See row information)  ?Pre-existing out of facility DNR order (yellow form or pink MOST form) Pink MOST/Yellow Form most recent copy in chart - Physician notified to receive inpatient order     Pink MOST/Yellow Form most recent copy in chart - Physician notified to receive inpatient order   ? ? ?Current Medications (verified) ?Outpatient Encounter Medications as of 12/14/2021  ?Medication Sig  ? acetaminophen (TYLENOL) 500 MG tablet Take 1 tablet (500 mg total) by mouth 2 (two) times daily as needed.  ? Calcium Carbonate-Vit D-Min (CALCIUM 600+D3 PLUS MINERALS) 600-800 MG-UNIT TABS Take 2 tablets by mouth daily.  ? denosumab (PROLIA) 60 MG/ML SOSY injection Inject 60 mg into the skin every 6 (six) months.  ? fexofenadine (ALLEGRA) 180 MG tablet Take 180  mg by mouth daily.  ? metoprolol tartrate (LOPRESSOR) 25 MG tablet TAKE 1 TABLET(25 MG) BY MOUTH TWICE DAILY  ? Multiple Vitamin (MULTIVITAMIN) tablet Take 1 tablet by mouth daily.  ? Multiple Vitamins-Minerals (PRESERVISION AREDS 2 PO) Take 1 tablet by mouth daily.  ? Omega-3 Fatty Acids (FISH OIL) 1000 MG CAPS Take 1 capsule (1,000 mg total) by mouth daily.  ? omeprazole  (PRILOSEC) 40 MG capsule Take 1 capsule (40 mg total) by mouth daily.  ? Probiotic Product (PROBIOTIC FORMULA PO) Take 1 tablet by mouth every evening.   ? rosuvastatin (CRESTOR) 5 MG tablet Take 1 tablet (5 mg total) by mouth 4 (four) times a week.  ? ?No facility-administered encounter medications on file as of 12/14/2021.  ? ? ?Allergies (verified) ?Codeine and Morphine and related  ? ?History: ?Past Medical History:  ?Diagnosis Date  ? Asthma   ? BPPV (benign paroxysmal positional vertigo)   ? Breast cancer ([place])   ? left mastectomy 1992, right lumpectomy 2019  ? Cancer [facility])   ? Compression fracture of L3 lumbar vertebra 04/06/2015  ? Dysrhythmia 2019  ? pt states Cardiologist told her it is "pre A-fib"  ? Family history of breast cancer   ? Family history of colon cancer   ? Family history of pancreatic cancer   ? GERD (gastroesophageal reflux disease)   ? Hiatal hernia   ? pt states she doesnt have this  ? History of adenomatous polyp of colon   ? tubular adenoma 2005 and  2014 tubular adenoma and  hyperplastic polyp  ? History of breast cancer per pt no recurrence  ? dx 1992 --  s/p  left breast mastectomy and chemotherapy  ? Hyperlipidemia LDL goal <100   ? Osteoarthritis of right knee   ? Personal history of chemotherapy 1992  ? PONV (postoperative nausea and vomiting)   ? and "gets weak and light-leaded"  ? Sensation of pressure in bladder area   ? Vaginal vault prolapse   ? anterior  ? Wears glasses   ? Wears hearing aid   ? left only  ? ?Past Surgical History:  ?Procedure Laterality Date  ? BREAST BIOPSY Right 04/19/2018  ? BREAST LUMPECTOMY Right 05/17/2018  ? BREAST LUMPECTOMY WITH RADIOACTIVE SEED LOCALIZATION Right 05/17/2018  ? Procedure: RIGHT BREAST LUMPECTOMY WITH RADIOACTIVE SEED LOCALIZATION;  Surgeon: [name], MD;  Location: [place];  Service: General;  Laterality: Right;  ? CATARACT EXTRACTION W/ INTRAOCULAR LENS  IMPLANT, BILATERAL  2014  ? CHOLECYSTECTOMY  1985  ? COLONOSCOPY  last one  06-04-2013  ? CYSTOCELE REPAIR N/A 02/29/2016  ? Procedure: ANTERIOR VAULT PROLAPSE REPAIR COLOPLAST [place] FIXATION AUGMENTED AXIS DERMIS REPAIR  ;  Surgeon: Carolan Clines, MD;  Location: [place];  Service: Urology;  Laterality: N/A;  ? ESOPHAGOGASTRODUODENOSCOPY  08-31-2009  ? LAPAROSCOPIC ASSISTED VAGINAL HYSTERECTOMY  01-10-2000  ? w/ Bilateral Salpingoophorectomy /  Anterior & Posterior repair/  Pubovaginal Sling  ? MASTECTOMY Left 1992  ? TOTAL KNEE ARTHROPLASTY Right 05/21/2021  ? Procedure: RIGHT TOTAL KNEE ARTHROPLASTY;  Surgeon: Marybelle Killings, MD;  Location: [place];  Service: Orthopedics;  Laterality: Right;  ? ?Family History  ?Problem Relation Age of Onset  ? Heart attack Mother   ? Colon cancer Mother 64  ? Hypertension Mother   ? Heart disease Father   ? Breast cancer Sister   ?     pat half sister dx in her 63s  ? Pancreatic cancer Other  34  ? ?Social History  ? ?Socioeconomic History  ? Marital status: Widowed  ?  Spouse name: Not on file  ? Number of children: 4  ? Years of education: Not on file  ? Highest education level: Not on file  ?Occupational History  ? Occupation: Retired Pharmacist, hospital  ?  Employer: RETIRED  ?Tobacco Use  ? Smoking status: Former  ?  Years: 13.00  ?  Types: Cigarettes  ?  Quit date: 10/12/1983  ?  Years since quitting: 38.2  ? Smokeless tobacco: Never  ?Vaping Use  ? Vaping Use: Never used  ?Substance and Sexual Activity  ? Alcohol use: No  ? Drug use: No  ? Sexual activity: Not on file  ?Other Topics Concern  ? Not on file  ?Social History Narrative  ? Not on file  ? ?Social Determinants of Health  ? ?Financial Resource Strain: Low Risk   ? Difficulty of Paying Living Expenses: Not hard at all  ?Food Insecurity: No Food Insecurity  ? Worried About Charity fundraiser in the Last Year: Never true  ? Ran Out of Food in the Last Year: Never true  ?Transportation Needs: No Transportation Needs  ? Lack of Transportation (Medical): No  ? Lack of Transportation  (Non-Medical): No  ?Physical Activity: Sufficiently Active  ? Days of Exercise per Week: 5 days  ? Minutes of Exercise per Session: 30 min  ?Stress: No Stress Concern Present  ? Feeling of Stress : Not at

## 2021-12-14 NOTE — Patient Instructions (Addendum)
Rebecca Guzman , ?Thank you for taking time to come for your Medicare Wellness Visit. I appreciate your ongoing commitment to your health goals. Please review the following plan we discussed and let me know if I can assist you in the future.  ? ?Screening recommendations/referrals: ?Colonoscopy: Discontinued due to age; last done 06/04/2013 ?Mammogram: 04/22/2021; due every year ?Bone Density: 06/08/2020; due every 2 years ?Recommended yearly ophthalmology/optometry visit for glaucoma screening and checkup ?Recommended yearly dental visit for hygiene and checkup ? ?Vaccinations: ?Influenza vaccine: 04/19/2021 ?Pneumococcal vaccine: 07/11/2013, 07/25/2014 ?Tdap vaccine: 01/26/2015; due every 10 years ?Shingles vaccine: 04/28/2017, 10/20/2017   ?Covid-19: 08/22/2019, 09/12/2019, 06/01/2020, 01/27/2021 ? ?Advanced directives: Yes; documents on file/chart. ? ?Conditions/risks identified: Yes ? ?Next appointment: Please schedule your next Medicare Wellness Visit with your Nurse Health Advisor in 1 year by calling [phone]. ? ? ?Preventive Care 86 Years and Older, Female ?Preventive care refers to lifestyle choices and visits with your health care provider that can promote health and wellness. ?What does preventive care include? ?A yearly physical exam. This is also called an annual well check. ?Dental exams once or twice a year. ?Routine eye exams. Ask your health care provider how often you should have your eyes checked. ?Personal lifestyle choices, including: ?Daily care of your teeth and gums. ?Regular physical activity. ?Eating a healthy diet. ?Avoiding tobacco and drug use. ?Limiting alcohol use. ?Practicing safe sex. ?Taking low-dose aspirin every day. ?Taking vitamin and mineral supplements as recommended by your health care provider. ?What happens during an annual well check? ?The services and screenings done by your health care provider during your annual well check will depend on your age, overall health, lifestyle risk  factors, and family history of disease. ?Counseling  ?Your health care provider may ask you questions about your: ?Alcohol use. ?Tobacco use. ?Drug use. ?Emotional well-being. ?Home and relationship well-being. ?Sexual activity. ?Eating habits. ?History of falls. ?Memory and ability to understand (cognition). ?Work and work Statistician. ?Reproductive health. ?Screening  ?You may have the following tests or measurements: ?Height, weight, and BMI. ?Blood pressure. ?Lipid and cholesterol levels. These may be checked every 5 years, or more frequently if you are over 83 years old. ?Skin check. ?Lung cancer screening. You may have this screening every year starting at age 49 if you have a 30-pack-year history of smoking and currently smoke or have quit within the past 15 years. ?Fecal occult blood test (FOBT) of the stool. You may have this test every year starting at age 74. ?Flexible sigmoidoscopy or colonoscopy. You may have a sigmoidoscopy every 5 years or a colonoscopy every 10 years starting at age 20. ?Hepatitis C blood test. ?Hepatitis B blood test. ?Sexually transmitted disease (STD) testing. ?Diabetes screening. This is done by checking your blood sugar (glucose) after you have not eaten for a while (fasting). You may have this done every 1-3 years. ?Bone density scan. This is done to screen for osteoporosis. You may have this done starting at age 65. ?Mammogram. This may be done every 1-2 years. Talk to your health care provider about how often you should have regular mammograms. ?Talk with your health care provider about your test results, treatment options, and if necessary, the need for more tests. ?Vaccines  ?Your health care provider may recommend certain vaccines, such as: ?Influenza vaccine. This is recommended every year. ?Tetanus, diphtheria, and acellular pertussis (Tdap, Td) vaccine. You may need a Td booster every 10 years. ?Zoster vaccine. You may need this after age 4. ?Pneumococcal 13-valent  conjugate (PCV13) vaccine. One dose is recommended after age 32. ?Pneumococcal polysaccharide (PPSV23) vaccine. One dose is recommended after age 30. ?Talk to your health care provider about which screenings and vaccines you need and how often you need them. ?This information is not intended to replace advice given to you by your health care provider. Make sure you discuss any questions you have with your health care provider. ?Document Released: 08/21/2015 Document Revised: 04/13/2016 Document Reviewed: 05/26/2015 ?Elsevier Interactive Patient Education ? 2017 [employer]. ? ?Fall Prevention in the Home ?Falls can cause injuries. They can happen to people of all ages. There are many things you can do to make your home safe and to help prevent falls. ?What can I do on the outside of my home? ?Regularly fix the edges of walkways and driveways and fix any cracks. ?Remove anything that might make you trip as you walk through a door, such as a raised step or threshold. ?Trim any bushes or trees on the path to your home. ?Use bright outdoor lighting. ?Clear any walking paths of anything that might make someone trip, such as rocks or tools. ?Regularly check to see if handrails are loose or broken. Make sure that both sides of any steps have handrails. ?Any raised decks and porches should have guardrails on the edges. ?Have any leaves, snow, or ice cleared regularly. ?Use sand or salt on walking paths during winter. ?Clean up any spills in your garage right away. This includes oil or grease spills. ?What can I do in the bathroom? ?Use night lights. ?Install grab bars by the toilet and in the tub and shower. Do not use towel bars as grab bars. ?Use non-skid mats or decals in the tub or shower. ?If you need to sit down in the shower, use a plastic, non-slip stool. ?Keep the floor dry. Clean up any water that spills on the floor as soon as it happens. ?Remove soap buildup in the tub or shower regularly. ?Attach bath mats  securely with double-sided non-slip rug tape. ?Do not have throw rugs and other things on the floor that can make you trip. ?What can I do in the bedroom? ?Use night lights. ?Make sure that you have a light by your bed that is easy to reach. ?Do not use any sheets or blankets that are too big for your bed. They should not hang down onto the floor. ?Have a firm chair that has side arms. You can use this for support while you get dressed. ?Do not have throw rugs and other things on the floor that can make you trip. ?What can I do in the kitchen? ?Clean up any spills right away. ?Avoid walking on wet floors. ?Keep items that you use a lot in easy-to-reach places. ?If you need to reach something above you, use a strong step stool that has a grab bar. ?Keep electrical cords out of the way. ?Do not use floor polish or wax that makes floors slippery. If you must use wax, use non-skid floor wax. ?Do not have throw rugs and other things on the floor that can make you trip. ?What can I do with my stairs? ?Do not leave any items on the stairs. ?Make sure that there are handrails on both sides of the stairs and use them. Fix handrails that are broken or loose. Make sure that handrails are as long as the stairways. ?Check any carpeting to make sure that it is firmly attached to the stairs. Fix any carpet that is  loose or worn. ?Avoid having throw rugs at the top or bottom of the stairs. If you do have throw rugs, attach them to the floor with carpet tape. ?Make sure that you have a light switch at the top of the stairs and the bottom of the stairs. If you do not have them, ask someone to add them for you. ?What else can I do to help prevent falls? ?Wear shoes that: ?Do not have high heels. ?Have rubber bottoms. ?Are comfortable and fit you well. ?Are closed at the toe. Do not wear sandals. ?If you use a stepladder: ?Make sure that it is fully opened. Do not climb a closed stepladder. ?Make sure that both sides of the stepladder  are locked into place. ?Ask someone to hold it for you, if possible. ?Clearly mark and make sure that you can see: ?Any grab bars or handrails. ?First and last steps. ?Where the edge of each step is. ?Use to

## 2021-12-20 NOTE — Progress Notes (Signed)
Cardiology Office Note:    Date:  12/31/2021   ID:  Rebecca Guzman, DOB 04-Jul-1934, MRN [id number]  PCP:  Hoyt Koch, MD   [facility] Providers Cardiologist:  Ena Dawley, MD {   Referring MD: Hoyt Koch, *     History of Present Illness:    Rebecca Guzman is a 88 y.o. female with a hx of asthma, GERD, HLD and palpitations who was previously followed by Dr. Meda Coffee who now presents to clinic for follow-up of palpitations.  Per review of the record, the patient has been followed by Dr. Meda Coffee for palpitations. In 2018 she underwent Holter monitoring that showed frequent PACs (700 in 34 hrs) and few very short runs of atrial tachycardia with the longest lasting 4 beats. She was started on metoprolol 12.5 mg by mouth twice a day with significant improvement of symptoms. She also had a TTE that showed LVEF 60-65% and grade 1 diastolic dysfunction, trivial MR, normal left atrial size and mild TR. her metoprolol was later increased to 25 mg p.o. twice daily with improvement of her symptoms.  The patient also had breast cancer 1992 s/p chemo and mastectomy but no radiation at that time. She was diagnosed with recurrent breast cancer 10/2017 which was very localized; she is in remission and is off tamoxifen.  No chemo or radiation at that time.  Last seen in clinic on 12/2020 where she was doing well from a CV standpoint. TTE with strain was obtained which showed LVEF 58%, G1DD, GLS -20.5%, normal RV, trivial MR, RAP 3.  Today, the patient overall feels well. No chest pain, SOB, orthopnea, or PND. Blood pressure is well controlled. Tolerating medications without issues.  Palpitations are better controlled on the metoprolol. Has a lot of back problems which has been chronic. Walks and does some small resistance training and feels well with activity.   Past Medical History:  Diagnosis Date   Asthma    BPPV (benign paroxysmal positional vertigo)    Breast cancer  ([place])    left mastectomy 1992, right lumpectomy 2019   Cancer ([place])    Compression fracture of L3 lumbar vertebra 04/06/2015   Dysrhythmia 2019   pt states Cardiologist told her it is "pre A-fib"   Family history of breast cancer    Family history of colon cancer    Family history of pancreatic cancer    GERD (gastroesophageal reflux disease)    Hiatal hernia    pt states she doesnt have this   History of adenomatous polyp of colon    tubular adenoma 2005 and  2014 tubular adenoma and  hyperplastic polyp   History of breast cancer per pt no recurrence   dx 1992 --  s/p  left breast mastectomy and chemotherapy   Hyperlipidemia LDL goal <100    Osteoarthritis of right knee    Personal history of chemotherapy 1992   PONV (postoperative nausea and vomiting)    and "gets weak and light-leaded"   Sensation of pressure in bladder area    Vaginal vault prolapse    anterior   Wears glasses    Wears hearing aid    left only    Past Surgical History:  Procedure Laterality Date   BREAST BIOPSY Right 04/19/2018   BREAST LUMPECTOMY Right 05/17/2018   BREAST LUMPECTOMY WITH RADIOACTIVE SEED LOCALIZATION Right 05/17/2018   Procedure: RIGHT BREAST LUMPECTOMY WITH RADIOACTIVE SEED LOCALIZATION;  Surgeon: [name], MD;  Location: [facility];  Service: General;  Laterality: Right;   CATARACT EXTRACTION W/ INTRAOCULAR LENS  IMPLANT, BILATERAL  2014   CHOLECYSTECTOMY  1985   COLONOSCOPY  last one 06-04-2013   CYSTOCELE REPAIR N/A 02/29/2016   Procedure: ANTERIOR VAULT PROLAPSE REPAIR COLOPLAST [place] FIXATION AUGMENTED AXIS DERMIS REPAIR  ;  Surgeon: Carolan Clines, MD;  Location: [place];  Service: Urology;  Laterality: N/A;   ESOPHAGOGASTRODUODENOSCOPY  08-31-2009   LAPAROSCOPIC ASSISTED VAGINAL HYSTERECTOMY  01-10-2000   w/ Bilateral Salpingoophorectomy /  Anterior & Posterior repair/  Pubovaginal Sling   MASTECTOMY Left 1992   TOTAL KNEE ARTHROPLASTY Right  05/21/2021   Procedure: RIGHT TOTAL KNEE ARTHROPLASTY;  Surgeon: Marybelle Killings, MD;  Location: [place];  Service: Orthopedics;  Laterality: Right;    Current Medications: Current Meds  Medication Sig   acetaminophen (TYLENOL) 500 MG tablet Take 1 tablet (500 mg total) by mouth 2 (two) times daily as needed.   Calcium Carbonate-Vit D-Min (CALCIUM 600+D3 PLUS MINERALS) 600-800 MG-UNIT TABS Take 2 tablets by mouth daily.   denosumab (PROLIA) 60 MG/ML SOSY injection Inject 60 mg into the skin every 6 (six) months.   fexofenadine (ALLEGRA) 180 MG tablet Take 180 mg by mouth daily.   metoprolol tartrate (LOPRESSOR) 25 MG tablet TAKE 1 TABLET(25 MG) BY MOUTH TWICE DAILY   Multiple Vitamin (MULTIVITAMIN) tablet Take 1 tablet by mouth daily.   Multiple Vitamins-Minerals (PRESERVISION AREDS 2 PO) Take 1 tablet by mouth daily.   Omega-3 Fatty Acids (FISH OIL) 1000 MG CAPS Take 1 capsule (1,000 mg total) by mouth daily.   omeprazole (PRILOSEC) 40 MG capsule Take 1 capsule (40 mg total) by mouth daily.   Probiotic Product (PROBIOTIC FORMULA PO) Take 1 tablet by mouth every evening.    rosuvastatin (CRESTOR) 5 MG tablet Take 1 tablet (5 mg total) by mouth 4 (four) times a week.     Allergies:   Codeine and Morphine and related   Social History   Socioeconomic History   Marital status: Widowed    Spouse name: Not on file   Number of children: 4   Years of education: Not on file   Highest education level: Not on file  Occupational History   Occupation: Retired Product manager: RETIRED  Tobacco Use   Smoking status: Former    Years: 13.00    Types: Cigarettes    Quit date: 10/12/1983    Years since quitting: 38.2   Smokeless tobacco: Never  Vaping Use   Vaping Use: Never used  Substance and Sexual Activity   Alcohol use: No   Drug use: No   Sexual activity: Not on file  Other Topics Concern   Not on file  Social History Narrative   Not on file   Social Determinants of Health    Financial Resource Strain: Low Risk    Difficulty of Paying Living Expenses: Not hard at all  Food Insecurity: No Food Insecurity   Worried About Charity fundraiser in the Last Year: Never true    in the Last Year: Never true  Transportation Needs: No Transportation Needs   Lack of Transportation (Medical): No   Lack of Transportation (Non-Medical): No  Physical Activity: Sufficiently Active   Days of Exercise per Week: 5 days   Minutes of Exercise per Session: 30 min  Stress: No Stress Concern Present   Feeling of Stress : Not at all  Social Connections: Moderately Integrated   Frequency of Communication with  Friends and Family: More than three times a week   Frequency of Social Gatherings with Friends and Family: More than three times a week   Attends Religious Services: More than 4 times per year   Active Member of [employer] or Organizations: Yes   Attends Music therapist: More than 4 times per year   Marital Status: Divorced     Family History: The patient's family history includes Breast cancer in her sister; Colon cancer (age of onset: 43) in her mother; Heart attack in her mother; Heart disease in her father; Hypertension in her mother; Pancreatic cancer (age of onset: 67) in an other family member.  ROS:   Please see the history of present illness.    Review of Systems  Constitutional:  Negative for chills and fever.  Eyes:  Negative for blurred vision.  Respiratory:  Negative for cough.   Cardiovascular:  Negative for chest pain, palpitations, orthopnea, claudication, leg swelling and PND.  Gastrointestinal:  Negative for nausea and vomiting.  Genitourinary:  Negative for dysuria.  Musculoskeletal:  Positive for back pain and joint pain. Negative for falls.  Neurological:  Negative for dizziness and loss of consciousness.  Endo/Heme/Allergies:  Positive for environmental allergies.  Psychiatric/Behavioral:  Negative for substance abuse.     EKGs/Labs/Other Studies Reviewed:    The following studies were reviewed today: TTE 2016/10/14: Study Conclusions  - Left ventricle: The cavity size was normal. Wall thickness was    increased in a pattern of mild LVH. Systolic function was normal.    The estimated ejection fraction was in the range of 60% to 65%.    Wall motion was normal; there were no regional wall motion    abnormalities. Doppler parameters are consistent with abnormal    left ventricular relaxation (grade 1 diastolic dysfunction). The    E/e&' ratio is >15, suggesting elevated LV filling pressure.  - Mitral valve: Mildly thickened and sclerotic anterior leaflet.    There was trivial regurgitation.  - Left atrium: The atrium was normal in size.  - Tricuspid valve: There was mild regurgitation.  - Pulmonary arteries: PA peak pressure: 37 mm Hg (S).  - Inferior vena cava: The vessel was normal in size. The    respirophasic diameter changes were in the normal range (>= 50%),    consistent with normal central venous pressure.   Impressions:   - LVEF 60-65%, mild LVH, normal wall motion, grade 1 DD with    elevated LV filling pressure, trivial MR, normal LA size, mild    TR, RVSP 37 mmHg, normal IVC.   EKG:  No new tracing  Recent Labs: 05/18/2021: ALT 18; BUN 14; Creatinine, Ser 0.68; Potassium 3.9; Sodium 139 09/20/2021: Hemoglobin 12.5; Platelets 205.0  Recent Lipid Panel    Component Value Date/Time   CHOL 157 03/29/2021 1441   CHOL 187 01/26/2016 0802   TRIG 247.0 (H) 03/29/2021 1441   HDL 54.90 03/29/2021 1441   HDL 59 01/26/2016 0802   CHOLHDL 3 03/29/2021 1441   VLDL 49.4 (H) 03/29/2021 1441   LDLCALC 76 05/08/2020 0830   LDLDIRECT 78.0 03/29/2021 1441     Physical Exam:    VS:  BP 122/70   Pulse 81   Ht '5\' 5"'  (1.651 m)   Wt 168 lb 9.6 oz (76.5 kg)   LMP 11/21/1985   SpO2 97%   BMI 28.06 kg/m     Wt Readings from Last 3 Encounters:  12/31/21 168 lb 9.6 oz (76.5 kg)  10/18/21 169 lb 12.8  oz (77 kg)  09/20/21 168 lb 12.8 oz (76.6 kg)     GEN:  Well nourished, well developed in no acute distress HEENT: Normal NECK: No JVD; No carotid bruits CARDIAC: RRR, 1/6 systolic murmur. No rubs, gallops RESPIRATORY:  CTAB ABDOMEN: Soft, non-tender, non-distended MUSCULOSKELETAL:  No edema; No deformity  SKIN: Warm and dry NEUROLOGIC:  Alert and oriented x 3 PSYCHIATRIC:  Normal affect   ASSESSMENT:    1. Palpitations   2. Essential hypertension   3. PAC (premature atrial contraction)   4. Mixed hyperlipidemia   5. History of chemotherapy   6. Malignant neoplasm of upper-inner quadrant of right breast in female, estrogen receptor positive ([place])     PLAN:    In order of problems listed above:  #Palpitations: #PACs: Holter monitoring that showed frequent PACs (700 in 34 hrs) and few very short runs of atrial tachycardia with the longest lasting 4 beats. TTE with LVEF 08-67%, grade 1 diastolic dysfunction, trivial MR, normal left atrial size and mild TR. Improved on metop. -Continue metop 273m BID  #HTN: Well controlled and at goal. -Continue metop 280mBID -Goal <120s/80s  #HLD: LDL 76, HDL 63, TG 145, TC 162 (05/2020). -Tolerating crestor 73m71mx/week; renew as needed -Lipids per PCP  #Recurrent breast cancer: Had initial breast cancer in 1990s s/p mastectomy and chemo. No XRT. Had recurrent breast cancer in 2019. No s/p Right lumpectomy: IDC, 5.5 mm, grade 1, margins negative, ER 100%, PR 100%, HER-2 1+ by IHC, Ki-67 less than 1%, T1BNX stage Ia. Previously on tamoxifen, now off and in remission. -Management per Oncology    Medication Adjustments/Labs and Tests Ordered: Current medicines are reviewed at length with the patient today.  Concerns regarding medicines are outlined above.  No orders of the defined types were placed in this encounter.  No orders of the defined types were placed in this encounter.   Patient Instructions  Medication Instructions:    Your physician recommends that you continue on your current medications as directed. Please refer to the Current Medication list given to you today.  *If you need a refill on your cardiac medications before your next appointment, please call your pharmacy*   Follow-Up: [facility] and your health needs are our priority.  As part of our continuing mission to provide you with exceptional heart care, we have created designated Provider Care Teams.  These Care Teams include your primary Cardiologist (physician) and Advanced Practice Providers (APPs -  Physician Assistants and Nurse Practitioners) who all work together to provide you with the care you need, when you need it.  We recommend signing up for the patient portal called "MyChart".  Sign up information is provided on this After Visit Summary.  MyChart is used to connect with patients for Virtual Visits (Telemedicine).  Patients are able to view lab/test results, encounter notes, upcoming appointments, etc.  Non-urgent messages can be sent to your provider as well.   To learn more about what you can do with MyChart, go to [email or web address]  Your next appointment:   1 year(s)  The format for your next appointment:   In Person  Provider:   DR. PEMJohney FrameImportant Information About Sugar          Signed, HeaFreada BergeronD  12/31/2021 12:09 PM    [place]

## 2021-12-27 DIAGNOSIS — N8182 Incompetence or weakening of pubocervical tissue: Secondary | ICD-10-CM | POA: Diagnosis not present

## 2021-12-31 ENCOUNTER — Ambulatory Visit: Payer: Medicare PPO | Admitting: Cardiology

## 2021-12-31 ENCOUNTER — Encounter: Payer: Self-pay | Admitting: Cardiology

## 2021-12-31 VITALS — BP 122/70 | HR 81 | Ht 65.0 in | Wt 168.6 lb

## 2021-12-31 DIAGNOSIS — I10 Essential (primary) hypertension: Secondary | ICD-10-CM | POA: Diagnosis not present

## 2021-12-31 DIAGNOSIS — Z9221 Personal history of antineoplastic chemotherapy: Secondary | ICD-10-CM

## 2021-12-31 DIAGNOSIS — I491 Atrial premature depolarization: Secondary | ICD-10-CM | POA: Diagnosis not present

## 2021-12-31 DIAGNOSIS — R002 Palpitations: Secondary | ICD-10-CM | POA: Diagnosis not present

## 2021-12-31 DIAGNOSIS — Z170 Estrogen receptor positive status [ER+]: Secondary | ICD-10-CM | POA: Diagnosis not present

## 2021-12-31 DIAGNOSIS — E782 Mixed hyperlipidemia: Secondary | ICD-10-CM

## 2021-12-31 DIAGNOSIS — C50211 Malignant neoplasm of upper-inner quadrant of right female breast: Secondary | ICD-10-CM

## 2021-12-31 NOTE — Patient Instructions (Signed)
Medication Instructions:   Your physician recommends that you continue on your current medications as directed. Please refer to the Current Medication list given to you today.  *If you need a refill on your cardiac medications before your next appointment, please call your pharmacy*   Follow-Up: At [facility], you and your health needs are our priority.  As part of our continuing mission to provide you with exceptional heart care, we have created designated Provider Care Teams.  These Care Teams include your primary Cardiologist (physician) and Advanced Practice Providers (APPs -  Physician Assistants and Nurse Practitioners) who all work together to provide you with the care you need, when you need it.  We recommend signing up for the patient portal called "MyChart".  Sign up information is provided on this After Visit Summary.  MyChart is used to connect with patients for Virtual Visits (Telemedicine).  Patients are able to view lab/test results, encounter notes, upcoming appointments, etc.  Non-urgent messages can be sent to your provider as well.   To learn more about what you can do with MyChart, go to [email or web address].    Your next appointment:   1 year(s)  The format for your next appointment:   In Person  Provider:   DR. PEMBERTON  Important Information About Sugar       

## 2022-01-05 ENCOUNTER — Other Ambulatory Visit: Payer: Self-pay | Admitting: Hematology and Oncology

## 2022-01-05 DIAGNOSIS — Z9889 Other specified postprocedural states: Secondary | ICD-10-CM

## 2022-01-18 DIAGNOSIS — N390 Urinary tract infection, site not specified: Secondary | ICD-10-CM | POA: Diagnosis not present

## 2022-01-18 DIAGNOSIS — R319 Hematuria, unspecified: Secondary | ICD-10-CM | POA: Diagnosis not present

## 2022-01-19 DIAGNOSIS — L720 Epidermal cyst: Secondary | ICD-10-CM | POA: Diagnosis not present

## 2022-01-19 DIAGNOSIS — L578 Other skin changes due to chronic exposure to nonionizing radiation: Secondary | ICD-10-CM | POA: Diagnosis not present

## 2022-01-19 DIAGNOSIS — L821 Other seborrheic keratosis: Secondary | ICD-10-CM | POA: Diagnosis not present

## 2022-01-19 DIAGNOSIS — D2271 Melanocytic nevi of right lower limb, including hip: Secondary | ICD-10-CM | POA: Diagnosis not present

## 2022-01-19 DIAGNOSIS — D225 Melanocytic nevi of trunk: Secondary | ICD-10-CM | POA: Diagnosis not present

## 2022-01-19 DIAGNOSIS — L570 Actinic keratosis: Secondary | ICD-10-CM | POA: Diagnosis not present

## 2022-01-19 DIAGNOSIS — D485 Neoplasm of uncertain behavior of skin: Secondary | ICD-10-CM | POA: Diagnosis not present

## 2022-02-04 DIAGNOSIS — D0352 Melanoma in situ of breast (skin) (soft tissue): Secondary | ICD-10-CM | POA: Diagnosis not present

## 2022-02-04 DIAGNOSIS — C4352 Malignant melanoma of skin of breast: Secondary | ICD-10-CM | POA: Diagnosis not present

## 2022-02-06 ENCOUNTER — Other Ambulatory Visit: Payer: Self-pay | Admitting: Internal Medicine

## 2022-02-06 DIAGNOSIS — R079 Chest pain, unspecified: Secondary | ICD-10-CM

## 2022-02-06 DIAGNOSIS — K219 Gastro-esophageal reflux disease without esophagitis: Secondary | ICD-10-CM

## 2022-02-06 DIAGNOSIS — R1013 Epigastric pain: Secondary | ICD-10-CM

## 2022-03-04 DIAGNOSIS — D0339 Melanoma in situ of other parts of face: Secondary | ICD-10-CM | POA: Diagnosis not present

## 2022-03-04 DIAGNOSIS — L989 Disorder of the skin and subcutaneous tissue, unspecified: Secondary | ICD-10-CM | POA: Diagnosis not present

## 2022-03-04 DIAGNOSIS — L578 Other skin changes due to chronic exposure to nonionizing radiation: Secondary | ICD-10-CM | POA: Diagnosis not present

## 2022-03-04 DIAGNOSIS — L244 Irritant contact dermatitis due to drugs in contact with skin: Secondary | ICD-10-CM | POA: Diagnosis not present

## 2022-04-05 DIAGNOSIS — L923 Foreign body granuloma of the skin and subcutaneous tissue: Secondary | ICD-10-CM | POA: Diagnosis not present

## 2022-04-05 DIAGNOSIS — N939 Abnormal uterine and vaginal bleeding, unspecified: Secondary | ICD-10-CM | POA: Diagnosis not present

## 2022-04-05 DIAGNOSIS — N390 Urinary tract infection, site not specified: Secondary | ICD-10-CM | POA: Diagnosis not present

## 2022-04-05 DIAGNOSIS — R300 Dysuria: Secondary | ICD-10-CM | POA: Diagnosis not present

## 2022-04-05 DIAGNOSIS — N8182 Incompetence or weakening of pubocervical tissue: Secondary | ICD-10-CM | POA: Diagnosis not present

## 2022-04-08 ENCOUNTER — Encounter: Payer: Self-pay | Admitting: Internal Medicine

## 2022-04-13 DIAGNOSIS — L578 Other skin changes due to chronic exposure to nonionizing radiation: Secondary | ICD-10-CM | POA: Diagnosis not present

## 2022-04-13 DIAGNOSIS — L821 Other seborrheic keratosis: Secondary | ICD-10-CM | POA: Diagnosis not present

## 2022-04-13 DIAGNOSIS — D2271 Melanocytic nevi of right lower limb, including hip: Secondary | ICD-10-CM | POA: Diagnosis not present

## 2022-04-13 DIAGNOSIS — Z86018 Personal history of other benign neoplasm: Secondary | ICD-10-CM | POA: Diagnosis not present

## 2022-04-13 DIAGNOSIS — Z86006 Personal history of melanoma in-situ: Secondary | ICD-10-CM | POA: Diagnosis not present

## 2022-04-13 DIAGNOSIS — D485 Neoplasm of uncertain behavior of skin: Secondary | ICD-10-CM | POA: Diagnosis not present

## 2022-04-13 DIAGNOSIS — Z85820 Personal history of malignant melanoma of skin: Secondary | ICD-10-CM | POA: Diagnosis not present

## 2022-04-13 DIAGNOSIS — D2272 Melanocytic nevi of left lower limb, including hip: Secondary | ICD-10-CM | POA: Diagnosis not present

## 2022-04-13 DIAGNOSIS — L570 Actinic keratosis: Secondary | ICD-10-CM | POA: Diagnosis not present

## 2022-04-14 DIAGNOSIS — Z09 Encounter for follow-up examination after completed treatment for conditions other than malignant neoplasm: Secondary | ICD-10-CM | POA: Diagnosis not present

## 2022-04-14 DIAGNOSIS — N390 Urinary tract infection, site not specified: Secondary | ICD-10-CM | POA: Diagnosis not present

## 2022-04-21 ENCOUNTER — Ambulatory Visit: Payer: Medicare PPO

## 2022-04-22 ENCOUNTER — Telehealth: Payer: Self-pay | Admitting: *Deleted

## 2022-04-22 ENCOUNTER — Ambulatory Visit (INDEPENDENT_AMBULATORY_CARE_PROVIDER_SITE_OTHER): Payer: Medicare PPO | Admitting: *Deleted

## 2022-04-22 DIAGNOSIS — M810 Age-related osteoporosis without current pathological fracture: Secondary | ICD-10-CM | POA: Diagnosis not present

## 2022-04-22 DIAGNOSIS — Z23 Encounter for immunization: Secondary | ICD-10-CM | POA: Diagnosis not present

## 2022-04-22 MED ORDER — DENOSUMAB 60 MG/ML ~~LOC~~ SOSY
60.0000 mg | PREFILLED_SYRINGE | Freq: Once | SUBCUTANEOUS | Status: AC
  Administered 2022-04-22: 60 mg via SUBCUTANEOUS

## 2022-04-22 NOTE — Telephone Encounter (Signed)
Pt came in for her prolia and flu shot. She also want to ask MD abt getting the new covid shot. If she need to get, and does MD have any information on injection.Marland KitchenJohny Chess

## 2022-04-22 NOTE — Progress Notes (Signed)
Pls cosign for Prolia inj../lmb  

## 2022-04-22 NOTE — Telephone Encounter (Signed)
This is not out yet so I do not have definitive date on when this will be available. This will be available at pharmacies and we will not carry.

## 2022-04-22 NOTE — Telephone Encounter (Signed)
Notified pt w/MD response.../lmb 

## 2022-04-25 ENCOUNTER — Ambulatory Visit
Admission: RE | Admit: 2022-04-25 | Discharge: 2022-04-25 | Disposition: A | Discharge status: Home / Self Care | Payer: Medicare PPO | Source: Ambulatory Visit | Attending: Hematology and Oncology | Admitting: Hematology and Oncology

## 2022-04-25 DIAGNOSIS — Z853 Personal history of malignant neoplasm of breast: Secondary | ICD-10-CM | POA: Diagnosis not present

## 2022-04-25 DIAGNOSIS — Z9889 Other specified postprocedural states: Secondary | ICD-10-CM

## 2022-04-29 DIAGNOSIS — D485 Neoplasm of uncertain behavior of skin: Secondary | ICD-10-CM | POA: Diagnosis not present

## 2022-04-29 DIAGNOSIS — D2270 Melanocytic nevi of unspecified lower limb, including hip: Secondary | ICD-10-CM | POA: Diagnosis not present

## 2022-05-10 ENCOUNTER — Ambulatory Visit: Payer: Medicare PPO | Admitting: Internal Medicine

## 2022-05-10 ENCOUNTER — Encounter: Payer: Self-pay | Admitting: Internal Medicine

## 2022-05-10 DIAGNOSIS — R079 Chest pain, unspecified: Secondary | ICD-10-CM | POA: Diagnosis not present

## 2022-05-10 DIAGNOSIS — M810 Age-related osteoporosis without current pathological fracture: Secondary | ICD-10-CM

## 2022-05-10 DIAGNOSIS — R1013 Epigastric pain: Secondary | ICD-10-CM | POA: Diagnosis not present

## 2022-05-10 DIAGNOSIS — K219 Gastro-esophageal reflux disease without esophagitis: Secondary | ICD-10-CM | POA: Diagnosis not present

## 2022-05-10 MED ORDER — OMEPRAZOLE 40 MG PO CPDR: DELAYED_RELEASE_CAPSULE | ORAL | 3 refills | Status: DC

## 2022-05-10 NOTE — Patient Instructions (Signed)
We do not need labs today.    

## 2022-05-10 NOTE — Progress Notes (Signed)
   Subjective:   Patient ID: Rebecca Guzman, female    DOB: 05-11-1934, 88 y.o.   MRN: [id number]  HPI The patient is an 88 YO female coming in for follow up.  Review of Systems  Constitutional: Negative.   HENT: Negative.    Eyes: Negative.   Respiratory:  Negative for cough, chest tightness and shortness of breath.   Cardiovascular:  Negative for chest pain, palpitations and leg swelling.  Gastrointestinal:  Negative for abdominal distention, abdominal pain, constipation, diarrhea, nausea and vomiting.  Musculoskeletal: Negative.   Skin: Negative.   Neurological: Negative.   Psychiatric/Behavioral: Negative.      Objective:  Physical Exam Constitutional:      Appearance: She is well-developed.  HENT:     Head: Normocephalic and atraumatic.  Cardiovascular:     Rate and Rhythm: Normal rate and regular rhythm.  Pulmonary:     Effort: Pulmonary effort is normal. No respiratory distress.     Breath sounds: Normal breath sounds. No wheezing or rales.  Abdominal:     General: Bowel sounds are normal. There is no distension.     Palpations: Abdomen is soft.     Tenderness: There is no abdominal tenderness. There is no rebound.  Musculoskeletal:     Cervical back: Normal range of motion.  Skin:    General: Skin is warm and dry.  Neurological:     Mental Status: She is alert and oriented to person, place, and time.     Coordination: Coordination normal.     Vitals:   05/10/22 1542  BP: 128/64  Pulse: 77  SpO2: 97%  Weight: 172 lb (78 kg)  Height: '5\' 5"'$  (1.651 m)    Assessment & Plan:

## 2022-05-12 DIAGNOSIS — D485 Neoplasm of uncertain behavior of skin: Secondary | ICD-10-CM | POA: Diagnosis not present

## 2022-05-14 ENCOUNTER — Encounter: Payer: Self-pay | Admitting: Internal Medicine

## 2022-05-14 NOTE — Assessment & Plan Note (Signed)
Refilled omeprazole 40 mg daily which is controlling symptoms.

## 2022-05-14 NOTE — Assessment & Plan Note (Signed)
Continue prolia every 6 months and just had recently. Due for DEXA in the early 2024.

## 2022-05-17 DIAGNOSIS — N8182 Incompetence or weakening of pubocervical tissue: Secondary | ICD-10-CM | POA: Diagnosis not present

## 2022-05-17 DIAGNOSIS — R319 Hematuria, unspecified: Secondary | ICD-10-CM | POA: Diagnosis not present

## 2022-05-18 ENCOUNTER — Ambulatory Visit: Payer: Self-pay | Admitting: Licensed Clinical Social Worker

## 2022-05-18 NOTE — Patient Instructions (Signed)
Visit Information  Thank you for taking time to visit with me today. Please don't hesitate to contact me if I can be of assistance to you.   We discussed the following today:   Goals Addressed             This Visit's Progress    Care Coordination Activities       Care Coordination Interventions: Reviewed Care Coordination Services:phone appointment scheduled Discussed benefits of Medicare Annual Wellness Visit: completed May 2023            Our next appointment is by telephone on 05/23/22 at 1:15  Patient verbalizes understanding of instructions and care plan provided today and agrees to view in [place]. Active MyChart status and patient understanding of how to access instructions and care plan via MyChart confirmed with patient.     Casimer Lanius, [place] [phone]     Care Coordination team works in collaboration with your primary care doctor.  Please call [phone] if you would like to schedule a phone appointment   1.The Care Coordination services include support from the care team which includes a Nurse Coordinator, Clinical Social Worker, or Pharmacist, to assist with navigating your physical and mental health needs. Marland Kitchen  2.The Care Coordination team is here to help remove barriers to health concerns and goals most important to you.  3.Care Coordination services are voluntary, you may decline or stop services at any time by request to the care team member

## 2022-05-18 NOTE — Patient Outreach (Signed)
  Care Coordination   Initial Visit Note   05/18/2022 Name: RACHELANN ENLOE MRN: [id number] DOB: 10/08/1933  DORRIS PIERRE is a 88 y.o. year old female who sees Hoyt Koch, MD for primary care. I spoke with  [name] by phone today.  What matters to the patients health and wellness today?      Goals Addressed             This Visit's Progress    Care Coordination Activities       Care Coordination Interventions: Reviewed Care Coordination Services:phone appointment scheduled Discussed benefits of Medicare Annual Wellness Visit: completed May 2023             SDOH assessments and interventions completed:  No     Care Coordination Interventions Activated:  Yes  Care Coordination Interventions:  Yes, provided   Follow up plan: Follow up call scheduled for 05/23/2022    Encounter Outcome:  Pt. Visit Completed   Casimer Lanius, [place] [phone]

## 2022-05-23 ENCOUNTER — Other Ambulatory Visit: Payer: Self-pay

## 2022-05-23 ENCOUNTER — Ambulatory Visit: Payer: Self-pay | Admitting: Licensed Clinical Social Worker

## 2022-05-23 DIAGNOSIS — E782 Mixed hyperlipidemia: Secondary | ICD-10-CM

## 2022-05-23 DIAGNOSIS — R002 Palpitations: Secondary | ICD-10-CM

## 2022-05-23 DIAGNOSIS — E785 Hyperlipidemia, unspecified: Secondary | ICD-10-CM

## 2022-05-23 DIAGNOSIS — I493 Ventricular premature depolarization: Secondary | ICD-10-CM

## 2022-05-23 MED ORDER — METOPROLOL TARTRATE 25 MG PO TABS: ORAL_TABLET | ORAL | 2 refills | Status: DC

## 2022-05-23 NOTE — Patient Outreach (Signed)
  Care Coordination  Initial Visit Note   05/23/2022 Name: ZIAIRE BIESER MRN: [id number] DOB: 1933/11/16  NANNA ERTLE is a 88 y.o. year old female who sees Hoyt Koch, MD for primary care. I spoke with  [name] by phone today.  What matters to the patients health and wellness today?    Patient reports no concerns or needs from Care Coordination team with health and wellness related to physical or mental heath. .  She is doing well with managing her health needs at this time    Goals Addressed             This Visit's Progress    COMPLETED: Care Coordination Activities No Follow up Required       Care Coordination Interventions: Reviewed Care Coordination Services:Declined Discussed benefits of Medicare Annual Wellness Visit: completed May 2023  Concerns with obtaining required medications: none Assessed Social Determinants of Health        SDOH assessments and interventions completed:  Yes  SDOH Interventions Today    Flowsheet Row Most Recent Value  SDOH Interventions   Utilities Interventions Intervention Not Indicated  Stress Interventions Intervention Not Indicated        Care Coordination Interventions Activated:  Yes  Care Coordination Interventions:  Yes, provided   Follow up plan: No further intervention required.   Encounter Outcome:  Pt. Visit Completed   Casimer Lanius, [place] [phone]

## 2022-05-23 NOTE — Patient Instructions (Signed)
Visit Information  Thank you for taking time to visit with me today. Please don't hesitate to contact me if I can be of assistance to you.   Following are the goals we discussed today:   Goals Addressed             This Visit's Progress    COMPLETED: Care Coordination Activities No Follow up Required       Care Coordination Interventions: Reviewed Care Coordination Services:Declined Discussed benefits of Medicare Annual Wellness Visit: completed May 2023  Concerns with obtaining required medications: none Assessed Social Determinants of Health       Patient verbalizes understanding of instructions and care plan provided today and agrees to view in [place]. Active MyChart status and patient understanding of how to access instructions and care plan via MyChart confirmed with patient.     No further follow up required: By Care Coordination at this time  Casimer Lanius, [place] [phone]    Care Coordination team works in collaboration with your primary care doctor.  Please call [phone] if you would like to schedule a phone appointment   1.The Care Coordination services include support from the care team which includes a Nurse Coordinator, Clinical Social Worker, or Pharmacist, to assist with navigating your physical and mental health needs. Marland Kitchen  2.The Care Coordination team is here to help remove barriers to health concerns and goals most important to you.  3.Care Coordination services are voluntary, you may decline or stop services at any time by request to the care team member

## 2022-06-15 DIAGNOSIS — Z86018 Personal history of other benign neoplasm: Secondary | ICD-10-CM | POA: Diagnosis not present

## 2022-06-15 DIAGNOSIS — D2272 Melanocytic nevi of left lower limb, including hip: Secondary | ICD-10-CM | POA: Diagnosis not present

## 2022-06-15 DIAGNOSIS — Z5189 Encounter for other specified aftercare: Secondary | ICD-10-CM | POA: Diagnosis not present

## 2022-06-20 DIAGNOSIS — N8182 Incompetence or weakening of pubocervical tissue: Secondary | ICD-10-CM | POA: Diagnosis not present

## 2022-07-06 DIAGNOSIS — N390 Urinary tract infection, site not specified: Secondary | ICD-10-CM | POA: Diagnosis not present

## 2022-07-06 DIAGNOSIS — R8290 Unspecified abnormal findings in urine: Secondary | ICD-10-CM | POA: Diagnosis not present

## 2022-07-07 DIAGNOSIS — Z5189 Encounter for other specified aftercare: Secondary | ICD-10-CM | POA: Diagnosis not present

## 2022-07-07 DIAGNOSIS — Z86018 Personal history of other benign neoplasm: Secondary | ICD-10-CM | POA: Diagnosis not present

## 2022-07-20 DIAGNOSIS — D485 Neoplasm of uncertain behavior of skin: Secondary | ICD-10-CM | POA: Diagnosis not present

## 2022-07-20 DIAGNOSIS — L578 Other skin changes due to chronic exposure to nonionizing radiation: Secondary | ICD-10-CM | POA: Diagnosis not present

## 2022-07-20 DIAGNOSIS — L570 Actinic keratosis: Secondary | ICD-10-CM | POA: Diagnosis not present

## 2022-07-20 DIAGNOSIS — D2271 Melanocytic nevi of right lower limb, including hip: Secondary | ICD-10-CM | POA: Diagnosis not present

## 2022-07-20 DIAGNOSIS — Z86006 Personal history of melanoma in-situ: Secondary | ICD-10-CM | POA: Diagnosis not present

## 2022-07-20 DIAGNOSIS — Z86018 Personal history of other benign neoplasm: Secondary | ICD-10-CM | POA: Diagnosis not present

## 2022-07-20 DIAGNOSIS — L821 Other seborrheic keratosis: Secondary | ICD-10-CM | POA: Diagnosis not present

## 2022-07-20 DIAGNOSIS — D2272 Melanocytic nevi of left lower limb, including hip: Secondary | ICD-10-CM | POA: Diagnosis not present

## 2022-07-20 DIAGNOSIS — D225 Melanocytic nevi of trunk: Secondary | ICD-10-CM | POA: Diagnosis not present

## 2022-07-20 DIAGNOSIS — Z85820 Personal history of malignant melanoma of skin: Secondary | ICD-10-CM | POA: Diagnosis not present

## 2022-08-15 DIAGNOSIS — J3089 Other allergic rhinitis: Secondary | ICD-10-CM | POA: Diagnosis not present

## 2022-08-15 DIAGNOSIS — J300 Vasomotor rhinitis: Secondary | ICD-10-CM | POA: Diagnosis not present

## 2022-08-15 DIAGNOSIS — R052 Subacute cough: Secondary | ICD-10-CM | POA: Diagnosis not present

## 2022-08-23 ENCOUNTER — Ambulatory Visit (INDEPENDENT_AMBULATORY_CARE_PROVIDER_SITE_OTHER): Payer: Medicare PPO

## 2022-08-23 ENCOUNTER — Ambulatory Visit: Payer: Medicare PPO | Admitting: Orthopaedic Surgery

## 2022-08-23 DIAGNOSIS — Z96651 Presence of right artificial knee joint: Secondary | ICD-10-CM

## 2022-08-23 DIAGNOSIS — M5416 Radiculopathy, lumbar region: Secondary | ICD-10-CM

## 2022-08-23 NOTE — Progress Notes (Signed)
Office Visit Note   Patient: Rebecca Guzman           Date of Birth: 08-12-1933           MRN: [id number] Visit Date: 08/23/2022              Requested by: Hoyt Koch, MD [address] [place],  [place] [address] PCP: Hoyt Koch, MD   Assessment & Plan: Visit Diagnoses:  1. Lumbar radiculopathy   2. Hx of total knee arthroplasty, right     Plan: Patient requesting proceeding with lumbar epidural which in the past has given her good relief.  She will continue to work on quad strengthening for right total knee arthroplasty.  She can follow-up after epidural if needed.  Follow-Up Instructions: No follow-ups on file.   Orders:  Orders Placed This Encounter  Procedures   XR Knee 1-2 Views Right   XR Lumbar Spine 2-3 Views   No orders of the defined types were placed in this encounter.     Procedures: No procedures performed   Clinical Data: No additional findings.   Subjective: Chief Complaint  Patient presents with   Right Knee - Pain   Lower Back - Pain    HPI 89 year old female returns post right total knee arthroplasty 05/21/2021 with improved strength she has occasionally had some popping in her knee with activities and sometimes it tires.  Some difficulty getting him out of a car particularly of the seat is lower.  He has had chronic back pain worse recently.  She gets improved symptoms lying down.  Previous MRI showed anterolisthesis moderate stenosis at L4-5 greater on the left than right and some moderate lateral recess stenosis at L3-4.  She has gotten several months relief with epidurals in the past.  Review of Systems   Objective: Vital Signs: LMP 11/21/1985   Physical Exam  Ortho Exam  Specialty Comments:  No specialty comments available.  Imaging: No results found.   PMFS History: Patient Active Problem List   Diagnosis Date Noted   Anemia 09/21/2021   Routine general medical examination at a health care facility  09/21/2021   Hx of total knee arthroplasty, right 05/21/2021   Genetic testing 06/07/2018   History of breast cancer 04/19/2018   Wears hearing aid    Wears glasses    Vaginal vault prolapse    Hiatal hernia    BPPV (benign paroxysmal positional vertigo)    Palpitations 10/31/2016   Radicular pain of right lower back 04/06/2015   Compression fracture of L3 lumbar vertebra 04/06/2015   Senile osteoporosis 04/06/2015   DDD (degenerative disc disease), lumbar 04/06/2015   Fatigue 01/17/2014   Hx of adenomatous colonic polyps 07/11/2013   Hyperlipidemia LDL goal <100    Bladder prolapse, female, acquired    Esophageal reflux 10/12/2011   Past Medical History:  Diagnosis Date   Asthma    BPPV (benign paroxysmal positional vertigo)    Breast cancer ([place])    left mastectomy 1992, right lumpectomy 2019   Cancer ([facility])    Compression fracture of L3 lumbar vertebra 04/06/2015   Dysrhythmia 2019   pt states Cardiologist told her it is "pre A-fib"   Family history of breast cancer    Family history of colon cancer    Family history of pancreatic cancer    GERD (gastroesophageal reflux disease)    Hiatal hernia    pt states she doesnt have this   History of adenomatous  polyp of colon    tubular adenoma 2005 and  2014 tubular adenoma and  hyperplastic polyp   History of breast cancer per pt no recurrence   dx 1992 --  s/p  left breast mastectomy and chemotherapy   Hyperlipidemia LDL goal <100    Osteoarthritis of right knee    Personal history of chemotherapy 1992   PONV (postoperative nausea and vomiting)    and "gets weak and light-leaded"   Sensation of pressure in bladder area    Vaginal vault prolapse    anterior   Wears glasses    Wears hearing aid    left only    Family History  Problem Relation Age of Onset   Heart attack Mother    Colon cancer Mother 74   Hypertension Mother    Heart disease Father    Breast cancer Sister        pat half sister dx in her 8s    Pancreatic cancer Other 15    Past Surgical History:  Procedure Laterality Date   BREAST BIOPSY Right 04/19/2018   BREAST LUMPECTOMY Right 05/17/2018   BREAST LUMPECTOMY WITH RADIOACTIVE SEED LOCALIZATION Right 05/17/2018   Procedure: RIGHT BREAST LUMPECTOMY WITH RADIOACTIVE SEED LOCALIZATION;  Surgeon: [name], MD;  Location: [facility];  Service: General;  Laterality: Right;   CATARACT EXTRACTION W/ INTRAOCULAR LENS  IMPLANT, BILATERAL  2014   CHOLECYSTECTOMY  1985   COLONOSCOPY  last one 06-04-2013   CYSTOCELE REPAIR N/A 02/29/2016   Procedure: ANTERIOR VAULT PROLAPSE REPAIR COLOPLAST [place]  ;  Surgeon: Carolan Clines, MD;  Location: [facility];  Service: Urology;  Laterality: N/A;   ESOPHAGOGASTRODUODENOSCOPY  08-31-2009   LAPAROSCOPIC ASSISTED VAGINAL HYSTERECTOMY  01-10-2000   w/ Bilateral Salpingoophorectomy /  Anterior & Posterior repair/  Pubovaginal Sling   MASTECTOMY Left 1992   TOTAL KNEE ARTHROPLASTY Right 05/21/2021   Procedure: RIGHT TOTAL KNEE ARTHROPLASTY;  Surgeon: Marybelle Killings, MD;  Location: [facility];  Service: Orthopedics;  Laterality: Right;   Social History   Occupational History   Occupation: Retired Product manager: RETIRED  Tobacco Use   Smoking status: Former    Years: 13.00    Types: Cigarettes    Quit date: 10/12/1983    Years since quitting: 38.8   Smokeless tobacco: Never  Vaping Use   Vaping Use: Never used  Substance and Sexual Activity   Alcohol use: No   Drug use: No   Sexual activity: Not on file

## 2022-08-24 DIAGNOSIS — N8111 Cystocele, midline: Secondary | ICD-10-CM | POA: Diagnosis not present

## 2022-08-24 DIAGNOSIS — N390 Urinary tract infection, site not specified: Secondary | ICD-10-CM | POA: Diagnosis not present

## 2022-09-05 ENCOUNTER — Ambulatory Visit: Payer: Self-pay

## 2022-09-05 ENCOUNTER — Ambulatory Visit: Payer: Medicare PPO | Admitting: Physical Medicine and Rehabilitation

## 2022-09-05 VITALS — BP 147/74 | HR 76

## 2022-09-05 DIAGNOSIS — M5416 Radiculopathy, lumbar region: Secondary | ICD-10-CM | POA: Diagnosis not present

## 2022-09-05 DIAGNOSIS — M48062 Spinal stenosis, lumbar region with neurogenic claudication: Secondary | ICD-10-CM

## 2022-09-05 MED ORDER — METHYLPREDNISOLONE ACETATE 80 MG/ML IJ SUSP
80.0000 mg | Freq: Once | INTRAMUSCULAR | Status: AC
  Administered 2022-09-05: 80 mg

## 2022-09-05 NOTE — Patient Instructions (Signed)

## 2022-09-05 NOTE — Progress Notes (Unsigned)
Functional Pain Scale - descriptive words and definitions  Intense (8)    Cannot complete any ADLs without much assistance/cannot concentrate/conversation is difficult/unable to sleep and unable to use distraction. Severe range order  Average Pain 8-10 depending on activity   +Driver, -BT, -Dye Allergies.  Lower back pain on right side, but can alternate to the left. Radiating into the hips

## 2022-09-07 NOTE — Progress Notes (Signed)
Rebecca Guzman - 89 y.o. female MRN [id number]  Date of birth: 12-30-1933  Office Visit Note: Visit Date: 09/05/2022 PCP: Hoyt Koch, MD Referred by: Hoyt Koch, *  Subjective: Chief Complaint  Patient presents with   Lower Back - Pain   HPI:  Rebecca Guzman is a 89 y.o. female who comes in today at the request of [name] for planned Right L5-S1 Lumbar Interlaminar epidural steroid injection with fluoroscopic guidance.  The patient has failed conservative care including home exercise, medications, time and activity modification.  This injection will be diagnostic and hopefully therapeutic.  Please see requesting physician notes for further details and justification. Prior to another spine intervention I would recommend updated Lumbar spine MRI.   ROS Otherwise per HPI.  Assessment & Plan: Visit Diagnoses:    ICD-10-CM   1. Lumbar radiculopathy  M54.16 XR C-ARM NO REPORT    Epidural Steroid injection    methylPREDNISolone acetate (DEPO-MEDROL) injection 80 mg    2. Spinal stenosis of lumbar region with neurogenic claudication  M48.062 XR C-ARM NO REPORT    Epidural Steroid injection    methylPREDNISolone acetate (DEPO-MEDROL) injection 80 mg      Plan: No additional findings.   Meds & Orders:  Meds ordered this encounter  Medications   methylPREDNISolone acetate (DEPO-MEDROL) injection 80 mg    Orders Placed This Encounter  Procedures   XR C-ARM NO REPORT   Epidural Steroid injection    Follow-up: Return for visit to requesting provider as needed.   Procedures: No procedures performed  Lumbar Epidural Steroid Injection - Interlaminar Approach with Fluoroscopic Guidance  Patient: Rebecca Guzman      Date of Birth: 06-09-1934 MRN: [id number] PCP: Hoyt Koch, MD      Visit Date: 09/05/2022   Universal Protocol:     Consent Given By: the patient  Position: PRONE  Additional Comments: Vital signs were monitored before  and after the procedure. Patient was prepped and draped in the usual sterile fashion. The correct patient, procedure, and site was verified.   Injection Procedure Details:   Procedure diagnoses: Lumbar radiculopathy [M54.16]   Meds Administered:  Meds ordered this encounter  Medications   methylPREDNISolone acetate (DEPO-MEDROL) injection 80 mg     Laterality: Right  Location/Site:  L5-S1  Needle: 3.5 in., 20 ga. Tuohy  Needle Placement: Paramedian epidural  Findings:   -Comments: Excellent flow of contrast into the epidural space.  Procedure Details: Using a paramedian approach from the side mentioned above, the region overlying the inferior lamina was localized under fluoroscopic visualization and the soft tissues overlying this structure were infiltrated with 4 ml. of 1% Lidocaine without Epinephrine. The Tuohy needle was inserted into the epidural space using a paramedian approach.   The epidural space was localized using loss of resistance along with counter oblique bi-planar fluoroscopic views.  After negative aspirate for air, blood, and CSF, a 2 ml. volume of Isovue-250 was injected into the epidural space and the flow of contrast was observed. Radiographs were obtained for documentation purposes.    The injectate was administered into the level noted above.   Additional Comments:  The patient tolerated the procedure well Dressing: 2 x 2 sterile gauze and Band-Aid    Post-procedure details: Patient was observed during the procedure. Post-procedure instructions were reviewed.  Patient left the clinic in stable condition.   Clinical History: No specialty comments available.     Objective:  VS:  HT:  WT:   BMI:     BP:(!) 147/74  HR:76bpm  TEMP: ( )  RESP:  Physical Exam Vitals and nursing note reviewed.  Constitutional:      General: She is not in acute distress.    Appearance: Normal appearance. She is not ill-appearing.  HENT:     Head:  Normocephalic and atraumatic.     Right Ear: External ear normal.     Left Ear: External ear normal.  Eyes:     Extraocular Movements: Extraocular movements intact.  Cardiovascular:     Rate and Rhythm: Normal rate.     Pulses: Normal pulses.  Pulmonary:     Effort: Pulmonary effort is normal. No respiratory distress.  Abdominal:     General: There is no distension.     Palpations: Abdomen is soft.  Musculoskeletal:        General: Tenderness present.     Cervical back: Neck supple.     Right lower leg: No edema.     Left lower leg: No edema.     Comments: Patient has good distal strength with no pain over the greater trochanters.  No clonus or focal weakness.  Skin:    Findings: No erythema, lesion or rash.  Neurological:     General: No focal deficit present.     Mental Status: She is alert and oriented to person, place, and time.     Sensory: No sensory deficit.     Motor: No weakness or abnormal muscle tone.     Coordination: Coordination normal.  Psychiatric:        Mood and Affect: Mood normal.        Behavior: Behavior normal.      Imaging: No results found.

## 2022-09-07 NOTE — Procedures (Signed)
Lumbar Epidural Steroid Injection - Interlaminar Approach with Fluoroscopic Guidance  Patient: Rebecca Guzman      Date of Birth: January 26, 1934 MRN: [id number] PCP: Hoyt Koch, MD      Visit Date: 09/05/2022   Universal Protocol:     Consent Given By: the patient  Position: PRONE  Additional Comments: Vital signs were monitored before and after the procedure. Patient was prepped and draped in the usual sterile fashion. The correct patient, procedure, and site was verified.   Injection Procedure Details:   Procedure diagnoses: Lumbar radiculopathy [M54.16]   Meds Administered:  Meds ordered this encounter  Medications   methylPREDNISolone acetate (DEPO-MEDROL) injection 80 mg     Laterality: Right  Location/Site:  L5-S1  Needle: 3.5 in., 20 ga. Tuohy  Needle Placement: Paramedian epidural  Findings:   -Comments: Excellent flow of contrast into the epidural space.  Procedure Details: Using a paramedian approach from the side mentioned above, the region overlying the inferior lamina was localized under fluoroscopic visualization and the soft tissues overlying this structure were infiltrated with 4 ml. of 1% Lidocaine without Epinephrine. The Tuohy needle was inserted into the epidural space using a paramedian approach.   The epidural space was localized using loss of resistance along with counter oblique bi-planar fluoroscopic views.  After negative aspirate for air, blood, and CSF, a 2 ml. volume of Isovue-250 was injected into the epidural space and the flow of contrast was observed. Radiographs were obtained for documentation purposes.    The injectate was administered into the level noted above.   Additional Comments:  The patient tolerated the procedure well Dressing: 2 x 2 sterile gauze and Band-Aid    Post-procedure details: Patient was observed during the procedure. Post-procedure instructions were reviewed.  Patient left the clinic in stable  condition.

## 2022-09-16 DIAGNOSIS — C50912 Malignant neoplasm of unspecified site of left female breast: Secondary | ICD-10-CM | POA: Diagnosis not present

## 2022-09-21 ENCOUNTER — Ambulatory Visit (INDEPENDENT_AMBULATORY_CARE_PROVIDER_SITE_OTHER): Payer: Medicare PPO | Admitting: Internal Medicine

## 2022-09-21 ENCOUNTER — Encounter: Payer: Self-pay | Admitting: Internal Medicine

## 2022-09-21 ENCOUNTER — Telehealth: Payer: Self-pay | Admitting: Internal Medicine

## 2022-09-21 VITALS — BP 130/70 | HR 100 | Temp 98.1°F | Ht 65.0 in | Wt 167.0 lb

## 2022-09-21 DIAGNOSIS — E785 Hyperlipidemia, unspecified: Secondary | ICD-10-CM

## 2022-09-21 DIAGNOSIS — Z0000 Encounter for general adult medical examination without abnormal findings: Secondary | ICD-10-CM | POA: Diagnosis not present

## 2022-09-21 DIAGNOSIS — M810 Age-related osteoporosis without current pathological fracture: Secondary | ICD-10-CM

## 2022-09-21 DIAGNOSIS — D649 Anemia, unspecified: Secondary | ICD-10-CM

## 2022-09-21 LAB — COMPREHENSIVE METABOLIC PANEL
ALT: 17 U/L (ref 0–35)
AST: 19 U/L (ref 0–37)
Albumin: 4.3 g/dL (ref 3.5–5.2)
Alkaline Phosphatase: 50 U/L (ref 39–117)
BUN: 23 mg/dL (ref 6–23)
CO2: 29 mEq/L (ref 19–32)
Calcium: 10 mg/dL (ref 8.4–10.5)
Chloride: 105 mEq/L (ref 96–112)
Creatinine, Ser: 0.86 mg/dL (ref 0.40–1.20)
GFR: 60.89 mL/min (ref >60.00)
Glucose, Bld: 79 mg/dL (ref 70–99)
Potassium: 4.3 mEq/L (ref 3.5–5.1)
Sodium: 139 mEq/L (ref 135–145)
Total Bilirubin: 0.5 mg/dL (ref 0.2–1.2)
Total Protein: 7.3 g/dL (ref 6.0–8.3)

## 2022-09-21 LAB — LIPID PANEL
Cholesterol: 170 mg/dL (ref 0–200)
HDL: 61.5 mg/dL (ref >39.00)
LDL Cholesterol: 83 mg/dL (ref 0–99)
NonHDL: 108.15
Total CHOL/HDL Ratio: 3
Triglycerides: 124 mg/dL (ref 0.0–149.0)
VLDL: 24.8 mg/dL (ref 0.0–40.0)

## 2022-09-21 LAB — CBC
HCT: 40 % (ref 36.0–46.0)
Hemoglobin: 13.7 g/dL (ref 12.0–15.0)
MCHC: 34.2 g/dL (ref 30.0–36.0)
MCV: 90 fl (ref 78.0–100.0)
Platelets: 228 10*3/uL (ref 150.0–400.0)
RBC: 4.44 Mil/uL (ref 3.87–5.11)
RDW: 13.8 % (ref 11.5–15.5)
WBC: 9.5 10*3/uL (ref 4.0–10.5)

## 2022-09-21 LAB — HEMOGLOBIN A1C: Hgb A1c MFr Bld: 5.9 % (ref 4.6–6.5)

## 2022-09-21 NOTE — Progress Notes (Unsigned)
   Subjective:   Patient ID: Rebecca Guzman, female    DOB: 04/29/1934, 89 y.o.   MRN: [id number]  HPI The patient is here for physical.  PMH, [facility], social history reviewed and updated  Review of Systems  Constitutional: Negative.   HENT: Negative.    Eyes: Negative.   Respiratory:  Negative for cough, chest tightness and shortness of breath.   Cardiovascular:  Negative for chest pain, palpitations and leg swelling.  Gastrointestinal:  Negative for abdominal distention, abdominal pain, constipation, diarrhea, nausea and vomiting.  Musculoskeletal:  Positive for back pain.  Skin: Negative.   Neurological: Negative.   Psychiatric/Behavioral: Negative.      Objective:  Physical Exam Constitutional:      Appearance: She is well-developed.  HENT:     Head: Normocephalic and atraumatic.  Cardiovascular:     Rate and Rhythm: Normal rate and regular rhythm.  Pulmonary:     Effort: Pulmonary effort is normal. No respiratory distress.     Breath sounds: Normal breath sounds. No wheezing or rales.  Abdominal:     General: Bowel sounds are normal. There is no distension.     Palpations: Abdomen is soft.     Tenderness: There is no abdominal tenderness. There is no rebound.  Musculoskeletal:     Cervical back: Normal range of motion.  Skin:    General: Skin is warm and dry.  Neurological:     Mental Status: She is alert and oriented to person, place, and time.     Coordination: Coordination normal.     Vitals:   09/21/22 0951  BP: 130/70  Pulse: 100  Temp: 98.1 F (36.7 C)  TempSrc: Oral  SpO2: 99%  Weight: 167 lb (75.8 kg)  Height: '5\' 5"'$  (1.651 m)    Assessment & Plan:

## 2022-09-21 NOTE — Telephone Encounter (Signed)
Patient called and stated that she wanted to know since she is over the age of 36, can she still take Ibuprofen instead of Tylenol for her back pain.  Best callback number for patient is [phone].

## 2022-09-22 NOTE — Assessment & Plan Note (Signed)
Flu shot up to date. Covid-19 counseled. Pneumonia complete. Shingrix complete. Tetanus due 2026. Colonoscopy aged out. Mammogram aged out, pap smear aged out and dexa due 2024. Counseled about sun safety and mole surveillance. Counseled about the dangers of distracted driving. Given 10 year screening recommendations.

## 2022-09-22 NOTE — Assessment & Plan Note (Signed)
No clinical signs of bleeding checking CBC to ensure stability and treat as appropriate.

## 2022-09-22 NOTE — Telephone Encounter (Signed)
Yes it is okay to use ibuprofen

## 2022-09-22 NOTE — Telephone Encounter (Signed)
Spoke with patient and informed her that it is okay for her to use ibuprofen

## 2022-09-22 NOTE — Assessment & Plan Note (Signed)
Checking lipid panel and adjust crestor 5 mg daily as needed for goal <100.

## 2022-09-22 NOTE — Assessment & Plan Note (Signed)
Continue prolia every 6 months lifelong. She is taking calcium and vitamin d. Encouraged weight bearing exercise.

## 2022-09-27 ENCOUNTER — Telehealth: Payer: Self-pay

## 2022-09-27 MED ORDER — NIRMATRELVIR/RITONAVIR (PAXLOVID)TABLET: 3.0000 | ORAL_TABLET | Freq: Two times a day (BID) | ORAL | 0 refills | Status: AC

## 2022-09-27 NOTE — Telephone Encounter (Signed)
Ok this is done 

## 2022-09-27 NOTE — Addendum Note (Signed)
Addended by: Biagio Borg on: 09/27/2022 01:19 PM   Modules accepted: Orders

## 2022-09-27 NOTE — Telephone Encounter (Signed)
Please assist as provider is out of office...  Pt has tested POS for COVID this morning. Her sxs started 09/26/2022... she is experiencing sore throat, fever, headache and congestion. Pt has asked that medication be sent to her pharmacy for her above sxs. She asked that Paxlovid be sent in.

## 2022-09-27 NOTE — Telephone Encounter (Signed)
Notified pt MD sent rxto [facility].../lmb 

## 2022-09-27 NOTE — Telephone Encounter (Signed)
Patient wants it sent to [facility] on [address] - other pharmacy does not have the medication

## 2022-10-03 DIAGNOSIS — C50912 Malignant neoplasm of unspecified site of left female breast: Secondary | ICD-10-CM | POA: Diagnosis not present

## 2022-10-10 DIAGNOSIS — H353131 Nonexudative age-related macular degeneration, bilateral, early dry stage: Secondary | ICD-10-CM | POA: Diagnosis not present

## 2022-10-14 NOTE — Progress Notes (Signed)
Patient Care Team: Hoyt Koch, MD as PCP - General (Internal Medicine) [name], MD as PCP - Cardiology (Cardiology) [name], MD as Consulting Physician (General Surgery) [name], MD as Consulting Physician (Hematology and Oncology) [name], MD as Consulting Physician (Radiation Oncology) Delice Bison Charlestine Massed, NP as Nurse Practitioner (Hematology and Oncology) Evans, [facility] as Consulting Physician (Ophthalmology)  DIAGNOSIS: No diagnosis found.  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, ER 100%, PR 100%, HER-2 -1+ by IHC, T1 a N0 stage I a clinical stage   05/17/2018 Surgery   Right lumpectomy: IDC, 5.5 mm, grade 1, margins negative, ER 100%, PR 100%, HER-2 1+ by IHC, Ki-67 less than 1%, T1BNX stage Ia   05/28/2018 -  Anti-estrogen oral therapy   Letrozole daily discontinued 03/04/19, switched to anastrozole 03/23/2019, switched to tamoxifen 06/04/19   05/30/2018 Cancer Staging   Staging form: Breast, AJCC 8th Edition - Pathologic: Stage IA (pT1b, pN0, cM0, G1, ER+, PR+, HER2-) - Signed by Gardenia Phlegm, NP on 05/30/2018   06/05/2018 Genetic Testing   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  The Multi-Gene Panel offered by [facility] includes sequencing and/or deletion duplication testing of the following 84 genes: AIP, ALK, APC, ATM, AXIN2,BAP1,  BARD1, BLM, BMPR1A, BRCA1, BRCA2, BRIP1, CASR, CDC73, CDH1, CDK4, CDKN1B, CDKN1C, CDKN2A (p14ARF), CDKN2A (p16INK4a), CEBPA, CHEK2, CTNNA1, DICER1, DIS3L2, EGFR (c.2369C>T, p.Thr790Met variant  only), EPCAM (Deletion/duplication testing only), FH, FLCN, GATA2, GPC3, GREM1 (Promoter region deletion/duplication testing only), HOXB13 (c.251G>A, p.Gly84Glu), HRAS, KIT, MAX, MEN1, MET, MITF (c.952G>A, p.Glu318Lys variant only), MLH1, MSH2, MSH3, MSH6, MUTYH, NBN, NF1, NF2, NTHL1, PALB2, PDGFRA, PHOX2B, PMS2, POLD1, POLE, POT1, PRKAR1A, PTCH1, PTEN, RAD50, RAD51C, RAD51D, RB1, RECQL4, RET, RUNX1, SDHAF2, SDHA (sequence changes only), SDHB, SDHC, SDHD, SMAD4, SMARCA4, SMARCB1, SMARCE1, STK11, SUFU, TERC, TERT, TMEM127, TP53, TSC1, TSC2, VHL, WRN and WT1.  The report date is 06/05/2018.     CHIEF COMPLIANT:   INTERVAL HISTORY: TEMPRESS BUNTE is a   ALLERGIES:  is allergic to codeine and morphine and related.  MEDICATIONS:  Current Outpatient Medications  Medication Sig Dispense Refill   acetaminophen (TYLENOL) 500 MG tablet Take 1 tablet (500 mg total) by mouth 2 (two) times daily as needed. 30 tablet 0   azelastine (OPTIVAR) 0.05 % ophthalmic solution 1 drop 2 (two) times daily.     Calcium Carbonate-Vit D-Min (CALCIUM 600+D3 PLUS MINERALS) 600-800 MG-UNIT TABS Take 2 tablets by mouth daily. 180 tablet 3   denosumab (PROLIA) 60 MG/ML SOSY injection Inject 60 mg into the skin every 6 (six) months.     ipratropium (ATROVENT) 0.03 % nasal spray Place into both nostrils.     levocetirizine (XYZAL) 5 MG tablet Take 5 mg by mouth daily.     metoprolol tartrate (LOPRESSOR) 25 MG tablet TAKE 1 TABLET(25 MG) BY MOUTH TWICE DAILY 180 tablet 2   Multiple Vitamin (MULTIVITAMIN) tablet Take 1 tablet by mouth daily.     Multiple Vitamins-Minerals (PRESERVISION AREDS 2 PO) Take 1 tablet by mouth daily.     Omega-3 Fatty Acids (FISH OIL) 1000 MG CAPS Take 1 capsule (1,000  mg total) by mouth daily.  0   omeprazole (PRILOSEC) 40 MG capsule TAKE 1 CAPSULE(40 MG) BY MOUTH DAILY 90 capsule 3   Probiotic Product (PROBIOTIC FORMULA PO) Take 1 tablet by mouth every evening.      rosuvastatin (CRESTOR) 5 MG  tablet Take 1 tablet (5 mg total) by mouth 4 (four) times a week. 48 tablet 3   No current facility-administered medications for this visit.    PHYSICAL EXAMINATION: ECOG PERFORMANCE STATUS: {CHL ONC ECOG PS:[phone]}  There were no vitals filed for this visit. There were no vitals filed for this visit.  BREAST:*** No palpable masses or nodules in either right or left breasts. No palpable axillary supraclavicular or infraclavicular adenopathy no breast tenderness or nipple discharge. (exam performed in the presence of a chaperone)  LABORATORY DATA:  I have reviewed the data as listed    Latest Ref Rng & Units 09/21/2022   10:35 AM 05/18/2021    9:40 AM 03/29/2021    2:41 PM  CMP  Glucose 70 - 99 mg/dL 79  88  86   BUN 6 - 23 mg/dL '23  14  15   '$ Creatinine 0.40 - 1.20 mg/dL 0.86  0.68  0.80   Sodium 135 - 145 mEq/L 139  139  137   Potassium 3.5 - 5.1 mEq/L 4.3  3.9  4.4   Chloride 96 - 112 mEq/L 105  106  103   CO2 19 - 32 mEq/L '29  28  27   '$ Calcium 8.4 - 10.5 mg/dL 10.0  9.1  9.8   Total Protein 6.0 - 8.3 g/dL 7.3  6.6  7.1   Total Bilirubin 0.2 - 1.2 mg/dL 0.5  0.7  0.4   Alkaline Phos 39 - 117 U/L 50  57  57   AST 0 - 37 U/L '19  21  17   '$ ALT 0 - 35 U/L '17  18  15     '$ Lab Results  Component Value Date   WBC 9.5 09/21/2022   HGB 13.7 09/21/2022   HCT 40.0 09/21/2022   MCV 90.0 09/21/2022   PLT 228.0 09/21/2022   NEUTROABS 4,449 05/08/2020    ASSESSMENT & PLAN:  No problem-specific Assessment & Plan notes found for this encounter.    No orders of the defined types were placed in this encounter.  The patient has a good understanding of the overall plan. she agrees with it. she will call with any problems that may develop before the next visit here. Total time spent: 30 mins including face to face time and time spent for planning, charting and co-ordination of care   Suzzette Righter, [place] 10/14/22    I Gardiner Coins am acting as a Education administrator for [employer]  ***

## 2022-10-18 DIAGNOSIS — D2271 Melanocytic nevi of right lower limb, including hip: Secondary | ICD-10-CM | POA: Diagnosis not present

## 2022-10-18 DIAGNOSIS — Z86006 Personal history of melanoma in-situ: Secondary | ICD-10-CM | POA: Diagnosis not present

## 2022-10-18 DIAGNOSIS — Z86018 Personal history of other benign neoplasm: Secondary | ICD-10-CM | POA: Diagnosis not present

## 2022-10-18 DIAGNOSIS — D2272 Melanocytic nevi of left lower limb, including hip: Secondary | ICD-10-CM | POA: Diagnosis not present

## 2022-10-18 DIAGNOSIS — L570 Actinic keratosis: Secondary | ICD-10-CM | POA: Diagnosis not present

## 2022-10-18 DIAGNOSIS — L578 Other skin changes due to chronic exposure to nonionizing radiation: Secondary | ICD-10-CM | POA: Diagnosis not present

## 2022-10-18 DIAGNOSIS — L821 Other seborrheic keratosis: Secondary | ICD-10-CM | POA: Diagnosis not present

## 2022-10-18 DIAGNOSIS — D485 Neoplasm of uncertain behavior of skin: Secondary | ICD-10-CM | POA: Diagnosis not present

## 2022-10-18 DIAGNOSIS — Z85820 Personal history of malignant melanoma of skin: Secondary | ICD-10-CM | POA: Diagnosis not present

## 2022-10-19 ENCOUNTER — Other Ambulatory Visit: Payer: Self-pay

## 2022-10-19 ENCOUNTER — Inpatient Hospital Stay: Payer: Medicare PPO | Attending: Hematology and Oncology | Admitting: Hematology and Oncology

## 2022-10-19 VITALS — BP 133/71 | HR 87 | Temp 97.3°F | Resp 18 | Ht 65.0 in | Wt 172.8 lb

## 2022-10-19 DIAGNOSIS — Z79899 Other long term (current) drug therapy: Secondary | ICD-10-CM | POA: Insufficient documentation

## 2022-10-19 DIAGNOSIS — N898 Other specified noninflammatory disorders of vagina: Secondary | ICD-10-CM | POA: Diagnosis not present

## 2022-10-19 DIAGNOSIS — Z08 Encounter for follow-up examination after completed treatment for malignant neoplasm: Secondary | ICD-10-CM | POA: Diagnosis not present

## 2022-10-19 DIAGNOSIS — N8110 Cystocele, unspecified: Secondary | ICD-10-CM | POA: Diagnosis not present

## 2022-10-19 DIAGNOSIS — Z85820 Personal history of malignant melanoma of skin: Secondary | ICD-10-CM | POA: Diagnosis not present

## 2022-10-19 DIAGNOSIS — Z9012 Acquired absence of left breast and nipple: Secondary | ICD-10-CM | POA: Diagnosis not present

## 2022-10-19 DIAGNOSIS — M8580 Other specified disorders of bone density and structure, unspecified site: Secondary | ICD-10-CM | POA: Insufficient documentation

## 2022-10-19 DIAGNOSIS — N8182 Incompetence or weakening of pubocervical tissue: Secondary | ICD-10-CM | POA: Diagnosis not present

## 2022-10-19 DIAGNOSIS — Z853 Personal history of malignant neoplasm of breast: Secondary | ICD-10-CM | POA: Diagnosis not present

## 2022-10-19 NOTE — Assessment & Plan Note (Signed)
05/17/2018: Right lumpectomy: IDC, 5.5 mm, grade 1, margins negative, ER 100%, PR 100%, HER-2 1+ by IHC, Ki-67 less than 1%, T1BNX stage Ia History of left mastectomy 1992 We did not recommend adjuvant radiation therapy because of her favorable profile and her age.   Current treatment: Letrozole 2.5 mg daily started 05/28/2018, switched to anastrozole 03/23/2019 (due to arthralgias and fatigue), switched to tamoxifen 06/04/2019 discontinued 09/15/2020 (tamoxifen related fatigue)    Breast cancer surveillance:  Mammogram right breast: 04/22/2021: Benign breast density category C Breast exam 10/19/2022: Benign  Bone density in Nov 2021: T score -1.9 Osteopenia: rec Cal + Vit D and walking for exercises  Return to clinic in  1 year for follow-up.

## 2022-10-24 ENCOUNTER — Other Ambulatory Visit: Payer: Self-pay | Admitting: Hematology and Oncology

## 2022-10-24 DIAGNOSIS — Z1231 Encounter for screening mammogram for malignant neoplasm of breast: Secondary | ICD-10-CM

## 2022-10-24 DIAGNOSIS — Z853 Personal history of malignant neoplasm of breast: Secondary | ICD-10-CM

## 2022-11-09 DIAGNOSIS — N8182 Incompetence or weakening of pubocervical tissue: Secondary | ICD-10-CM | POA: Diagnosis not present

## 2022-11-28 ENCOUNTER — Telehealth: Payer: Self-pay

## 2022-11-28 NOTE — Telephone Encounter (Signed)
Pt has called in and asking that a request be sent in for her Prolia as she has not gotten one this year as of yet.

## 2022-12-01 DIAGNOSIS — L988 Other specified disorders of the skin and subcutaneous tissue: Secondary | ICD-10-CM | POA: Diagnosis not present

## 2022-12-01 DIAGNOSIS — D485 Neoplasm of uncertain behavior of skin: Secondary | ICD-10-CM | POA: Diagnosis not present

## 2022-12-05 ENCOUNTER — Other Ambulatory Visit (HOSPITAL_COMMUNITY): Payer: Self-pay

## 2022-12-05 ENCOUNTER — Telehealth: Payer: Self-pay

## 2022-12-05 NOTE — Telephone Encounter (Signed)
New encounter created for Prolia BIV. Will route to pool when BIV complete.

## 2022-12-05 NOTE — Telephone Encounter (Signed)
Prolia VOB initiated via [email or web address] 

## 2022-12-14 ENCOUNTER — Other Ambulatory Visit (HOSPITAL_COMMUNITY): Payer: Self-pay

## 2022-12-14 DIAGNOSIS — N390 Urinary tract infection, site not specified: Secondary | ICD-10-CM | POA: Diagnosis not present

## 2022-12-14 DIAGNOSIS — R8279 Other abnormal findings on microbiological examination of urine: Secondary | ICD-10-CM | POA: Diagnosis not present

## 2022-12-14 NOTE — Telephone Encounter (Signed)
Pharmacy Patient Advocate Encounter   Placed a call to [facility] to verify benefits for Prolia injection. Representative stated there is a $20 office visit copay, no coinsurance fee, no deductible, and prior authorization is required.    Received notification from [facility] that prior authorization for Prolia is required/requested.   PA submitted on 12/14/22 to (ins) [employer] via CoverMyMeds Key  # [id number] Status is pending    The current 180 day co-pay is, $64.00 with pharmacy benefit.

## 2022-12-15 NOTE — Telephone Encounter (Signed)
Pt ready for scheduling for Prolia on or after : 12/15/22  Out-of-pocket cost due at time of visit: $20  Primary: [employer] Prolia co-insurance: 100% Admin fee co-insurance: $20  Secondary: N/A Prolia co-insurance:  Admin fee co-insurance:   Medical Benefit Details: Date Benefits were checked: 12/14/22 Deductible: no/ Coinsurance: 100%/ Admin Fee: $20  Prior Auth: approved PA# [id number] Expiration Date: 08/08/2023   Pharmacy benefit: Copay $64 If patient wants fill through the pharmacy benefit please send prescription to: [employer], and include estimated need by date in rx notes. Pharmacy will ship medication directly to the office.  Patient not eligible for Prolia Copay Card. Copay Card can make patient's cost as little as $25. Link to apply: [email or web address]  ** This summary of benefits is an estimation of the patient's out-of-pocket cost. Exact cost may very based on individual plan coverage.

## 2022-12-15 NOTE — Telephone Encounter (Signed)
Called pt no answer LMOM w/coverage review.Marland KitchenRaechel Chute

## 2022-12-22 ENCOUNTER — Telehealth: Payer: Self-pay

## 2022-12-22 NOTE — Telephone Encounter (Signed)
Contacted Tomie China to schedule their annual wellness visit. Appointment made for 01/03/23.  Agnes Lawrence, CMA (AAMA)  [facility] [phone]

## 2023-01-03 ENCOUNTER — Ambulatory Visit (INDEPENDENT_AMBULATORY_CARE_PROVIDER_SITE_OTHER): Payer: Medicare PPO

## 2023-01-03 VITALS — Ht 65.0 in | Wt 165.0 lb

## 2023-01-03 DIAGNOSIS — Z0000 Encounter for general adult medical examination without abnormal findings: Secondary | ICD-10-CM

## 2023-01-03 DIAGNOSIS — Z13820 Encounter for screening for osteoporosis: Secondary | ICD-10-CM

## 2023-01-03 NOTE — Progress Notes (Signed)
I connected with  Rebecca Guzman on 01/03/23 by a audio enabled telemedicine application and verified that I am speaking with the correct person using two identifiers.  Patient Location: Home  Provider Location: Office/Clinic  I discussed the limitations of evaluation and management by telemedicine. The patient expressed understanding and agreed to proceed.  Subjective:   Rebecca Guzman is a 89 y.o. female who presents for Medicare Annual (Subsequent) preventive examination.  Review of Systems     Cardiac Risk Factors include: advanced age (>41men, >59 women);family history of premature cardiovascular disease;dyslipidemia     Objective:    Today's Vitals   01/03/23 1505 01/03/23 1507  Weight: 165 lb (74.8 kg)   Height: 5\' 5"  (1.651 m)   PainSc: 5  5   PainLoc: Back    Body mass index is 27.46 kg/m.     01/03/2023    3:09 PM 12/14/2021   12:09 PM 06/08/2021    3:27 PM 05/21/2021    6:00 PM 05/18/2021    9:19 AM 05/07/2020    8:12 AM 10/31/2019   10:32 AM  Advanced Directives  Does Patient Have a Medical Advance Directive? Yes Yes Yes Yes Yes Yes Yes  Type of Estate agent of [place];Living will Living will;Healthcare Power of [place];Out of facility DNR (pink MOST or yellow form) Healthcare Power of [place];Living will Healthcare Power of [place];Living will Healthcare Power of [place];Living will Out of facility DNR (pink MOST or yellow form) Healthcare Power of [place];Living will;Out of facility DNR (pink MOST or yellow form)  Does patient want to make changes to medical advance directive? No - Patient declined No - Patient declined  No - Patient declined  No - Patient declined No - Patient declined  Copy of Healthcare Power of Attorney in Chart? Yes - validated most recent copy scanned in chart (See row information) Yes - validated most recent copy scanned in chart (See row information) Yes - validated most recent copy scanned in chart (See row  information) Yes - validated most recent copy scanned in chart (See row information) Yes - validated most recent copy scanned in chart (See row information)  Yes - validated most recent copy scanned in chart (See row information)  Pre-existing out of facility DNR order (yellow form or pink MOST form)  Pink MOST/Yellow Form most recent copy in chart - Physician notified to receive inpatient order     Pink MOST/Yellow Form most recent copy in chart - Physician notified to receive inpatient order    Current Medications (verified) Outpatient Encounter Medications as of 01/03/2023  Medication Sig   acetaminophen (TYLENOL) 500 MG tablet Take 1 tablet (500 mg total) by mouth 2 (two) times daily as needed.   azelastine (OPTIVAR) 0.05 % ophthalmic solution 1 drop 2 (two) times daily.   Calcium Carbonate-Vit D-Min (CALCIUM 600+D3 PLUS MINERALS) 600-800 MG-UNIT TABS Take 2 tablets by mouth daily.   denosumab (PROLIA) 60 MG/ML SOSY injection Inject 60 mg into the skin every 6 (six) months.   ipratropium (ATROVENT) 0.03 % nasal spray Place into both nostrils.   levocetirizine (XYZAL) 5 MG tablet Take 5 mg by mouth daily.   metoprolol tartrate (LOPRESSOR) 25 MG tablet TAKE 1 TABLET(25 MG) BY MOUTH TWICE DAILY   Multiple Vitamin (MULTIVITAMIN) tablet Take 1 tablet by mouth daily.   Multiple Vitamins-Minerals (PRESERVISION AREDS 2 PO) Take 1 tablet by mouth daily.   Omega-3 Fatty Acids (FISH OIL) 1000 MG CAPS Take 1 capsule (1,000 mg total)  by mouth daily.   omeprazole (PRILOSEC) 40 MG capsule TAKE 1 CAPSULE(40 MG) BY MOUTH DAILY   Probiotic Product (PROBIOTIC FORMULA PO) Take 1 tablet by mouth every evening.    rosuvastatin (CRESTOR) 5 MG tablet Take 1 tablet (5 mg total) by mouth 4 (four) times a week.   No facility-administered encounter medications on file as of 01/03/2023.    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"   Family history of breast cancer    Family history of colon cancer    Family history of pancreatic cancer    GERD (gastroesophageal reflux disease)    Hiatal hernia    pt states she doesnt have this   History of adenomatous polyp of colon    tubular adenoma 2005 and  2014 tubular adenoma and  hyperplastic polyp   History of breast cancer per pt no recurrence   dx 1992 --  s/p  left breast mastectomy and chemotherapy   Hyperlipidemia LDL goal <100    Osteoarthritis of right knee    Personal history of chemotherapy 1992   PONV (postoperative nausea and vomiting)    and "gets weak and light-leaded"   Sensation of pressure in bladder area    Vaginal vault prolapse    anterior   Wears glasses    Wears hearing aid    left only   Past Surgical History:  Procedure Laterality Date   BREAST BIOPSY Right 04/19/2018   BREAST LUMPECTOMY Right 05/17/2018   BREAST LUMPECTOMY WITH RADIOACTIVE SEED LOCALIZATION Right 05/17/2018   Procedure: RIGHT BREAST LUMPECTOMY WITH RADIOACTIVE SEED LOCALIZATION;  Surgeon: Almond Lint, MD;  Location: [facility] OR;  Service: General;  Laterality: Right;   CATARACT EXTRACTION W/ INTRAOCULAR LENS  IMPLANT, BILATERAL  2014   CHOLECYSTECTOMY  1985   COLONOSCOPY  last one 06-04-2013   CYSTOCELE REPAIR N/A 02/29/2016   Procedure: ANTERIOR VAULT PROLAPSE REPAIR COLOPLAST SACROSPINUS FIXATION AUGMENTED AXIS DERMIS REPAIR  ;  Surgeon: Jethro Bolus, MD;  Location[facility];  Service: Urology;  Laterality: N/A;   ESOPHAGOGASTRODUODENOSCOPY  08-31-2009   LAPAROSCOPIC ASSISTED VAGINAL HYSTERECTOMY  01-10-2000   w/ Bilateral Salpingoophorectomy /  Anterior & Posterior repair/  Pubovaginal Sling    MASTECTOMY Left 1992   TOTAL KNEE ARTHROPLASTY Right 05/21/2021   Procedure: RIGHT TOTAL KNEE ARTHROPLASTY;  Surgeon: Eldred Manges, MD;  Location: [facility] OR;  Service: Orthopedics;  Laterality: Right;   Family History  Problem Relation Age of Onset   Heart attack Mother    Colon cancer Mother 45   Hypertension Mother    Heart disease Father    Breast cancer Sister        pat half sister dx in her 36s   Pancreatic cancer Other 69   Social History   Socioeconomic History   Marital status: Widowed    Spouse name: Not on file   Number of children: 4   Years of education: Not on file   Highest education level: Not on file  Occupational History   Occupation: Retired Magazine features editor: RETIRED  Tobacco Use   Smoking status:  Former    Years: 13    Types: Cigarettes    Quit date: 10/12/1983    Years since quitting: 39.2   Smokeless tobacco: Never  Vaping Use   Vaping Use: Never used  Substance and Sexual Activity   Alcohol use: No   Drug use: No   Sexual activity: Not on file  Other Topics Concern   Not on file  Social History Narrative   Not on file   Social Determinants of Health   Financial Resource Strain: Low Risk  (01/03/2023)   Overall Financial Resource Strain (CARDIA)    Difficulty of Paying Living Expenses: Not hard at all  Food Insecurity: No Food Insecurity (01/03/2023)   Hunger Vital Sign    Worried About Running Out of Food in the Last Year: Never true    Ran Out of Food in the Last Year: Never true  Transportation Needs: No Transportation Needs (01/03/2023)   PRAPARE - Administrator, Civil Service (Medical): No    Lack of Transportation (Non-Medical): No  Physical Activity: Sufficiently Active (01/03/2023)   Exercise Vital Sign    Days of Exercise per Week: 5 days    Minutes of Exercise per Session: 30 min  Stress: No Stress Concern Present (01/03/2023)   [employer] of Occupational Health - Occupational Stress Questionnaire    Feeling of  Stress : Only a little  Social Connections: Moderately Integrated (01/03/2023)   Social Connection and Isolation Panel [NHANES]    Frequency of Communication with Friends and Family: More than three times a week    Frequency of Social Gatherings with Friends and Family: More than three times a week    Attends Religious Services: More than 4 times per year    Active Member of [employer] or Organizations: Yes    Attends Engineer, structural: More than 4 times per year    Marital Status: Divorced    Tobacco Counseling Counseling given: Not Answered   Clinical Intake:  Pre-visit preparation completed: Yes  Pain : 0-10 Pain Score: 5  Pain Type: Chronic pain Pain Location: Back     BMI - recorded: 27.46 Nutritional Risks: None Diabetes: No  How often do you need to have someone help you when you read instructions, pamphlets, or other written materials from your doctor or pharmacy?: 1 - Never What is the last grade level you completed in school?: College Graduate  Diabetic? No  Interpreter Needed?: No  Information entered by :: Talton Delpriore N. Momoka Stringfield, LPN.   Activities of Daily Living    01/03/2023    3:11 PM  In your present state of health, do you have any difficulty performing the following activities:  Hearing? 0  Vision? 0  Difficulty concentrating or making decisions? 1  Comment name recall  Walking or climbing stairs? 0  Dressing or bathing? 0  Doing errands, shopping? 0  Preparing Food and eating ? N  Using the Toilet? N  In the past six months, have you accidently leaked urine? Y  Comment wear protection at night  Do you have problems with loss of bowel control? N  Managing your Medications? N  Managing your Finances? N  Housekeeping or managing your Housekeeping? N    Patient Care Team: Myrlene Broker, MD as PCP - General (Internal Medicine) Lars Masson, MD as PCP - Cardiology (Cardiology) Almond Lint, MD as Consulting Physician  (General Surgery) Serena Croissant, MD as Consulting Physician (Hematology and Oncology) Antony Blackbird, MD as  Consulting Physician (Radiation Oncology) Axel Filler, Larna Daughters, NP as Nurse Practitioner (Hematology and Oncology) [facility] as Consulting Physician (Ophthalmology)  Indicate any recent Medical Services you may have received from other than [facility] providers in the past year (date may be approximate).     Assessment:   This is a routine wellness examination for Rebecca Guzman.  Hearing/Vision screen Hearing Screening - Comments:: Patient has hearing difficulty and wears hearing aids. Vision Screening - Comments:: Wears rx glasses - up to date with routine eye exams with [facility]   Dietary issues and exercise activities discussed: Current Exercise Habits: Home exercise routine, Type of exercise: walking, Time (Minutes): 30, Frequency (Times/Week): 5, Weekly Exercise (Minutes/Week): 150, Intensity: Moderate, Exercise limited by: None identified   Goals Addressed             This Visit's Progress    My goal for 2024 is to continue to maintain my health by staying independent and active.  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        Depression Screen    01/03/2023    3:10 PM 09/21/2022    9:57 AM 09/21/2022    9:56 AM 12/14/2021   11:42 AM 09/20/2021    1:20 PM 05/08/2020    8:43 AM 10/31/2019   10:31 AM  PHQ 2/9 Scores  PHQ - 2 Score 0 0 0 0 0 0 0  PHQ- 9 Score 0 0         Fall Risk    01/03/2023    3:10 PM 09/21/2022    9:56 AM 12/14/2021   11:38 AM 09/20/2021    1:20 PM 05/08/2020    8:43 AM  Fall Risk   Falls in the past year? 0 0 0 0 0  Number falls in past yr: 0 0 0 0 0  Injury with Fall? 0 0 0 0 0  Risk for fall due to : No Fall Risks  No Fall Risks    Follow up Falls prevention discussed Falls evaluation completed Falls evaluation completed      FALL RISK PREVENTION PERTAINING TO THE HOME:  Any stairs in or around the home? Yes   If so, are there any without handrails? No  Home free of loose throw rugs in walkways, pet beds, electrical cords, etc? Yes  Adequate lighting in your home to reduce risk of falls? Yes   ASSISTIVE DEVICES UTILIZED TO PREVENT FALLS:  Life alert? No  Use of a cane, walker or w/c? No  Grab bars in the bathroom? Yes  Shower chair or bench in shower? Yes  Elevated toilet seat or a handicapped toilet? Yes   TIMED UP AND GO:  Was the test performed? No . Telephonic Visit  Cognitive Function:    05/08/2020    8:44 AM 04/30/2018    8:48 AM 04/03/2017    2:34 PM 01/28/2016    2:00 PM  MMSE - Mini Mental State Exam  Orientation to time 5 5 5 5   Orientation to Place 5 5 5 5   Registration 3 3 3 3   Attention/ Calculation 5 5 5 4   Recall 3 2 3 3   Language- name 2 objects 2 2 2 2   Language- repeat 1 1 1 1   Language- follow 3 step command 3 3 3 3   Language- read & follow direction 0 1 1 1   Language-read & follow direction-comments Patient read "Close your eyes" inside her head. But didn't read out loud.  Write a sentence 1 1 1 1   Copy design 0 1 1 1   Copy design-comments Object drawn by patient has more than 10+ sides. (18 sides )     Total score 28 29 30 29         01/03/2023    3:10 PM 12/14/2021   12:17 PM 05/03/2019    8:48 AM  6CIT Screen  What Year? 0 points 0 points 0 points  What month? 0 points 0 points 0 points  What time? 0 points 0 points 0 points  Count back from 20 0 points 0 points 0 points  Months in reverse 0 points 0 points 0 points  Repeat phrase 0 points 0 points 0 points  Total Score 0 points 0 points 0 points    Immunizations Immunization History  Administered Date(s) Administered   Fluad Quad(high Dose 65+) 05/02/2019, 05/07/2020, 04/19/2021, 04/22/2022   Influenza, High Dose Seasonal PF 04/03/2017, 04/30/2018   Influenza, Quadrivalent, Recombinant, Inj, Pf 03/25/2016   Influenza,inj,Quad PF,6+ Mos 04/11/2013   Influenza-Unspecified 08/09/2011,  05/30/2014   PFIZER(Purple Top)SARS-COV-2 Vaccination 08/22/2019, 09/12/2019, 06/01/2020, 01/27/2021   Pfizer Covid-19 Vaccine Bivalent Booster 28yrs & up 10/05/2021   Pneumococcal Conjugate-13 07/11/2013   Pneumococcal Polysaccharide-23 07/25/2014   Tdap 01/26/2015   Zoster Recombinat (Shingrix) 04/28/2017, 10/20/2017   Zoster, Live 08/08/2010    TDAP status: Up to date  Flu Vaccine status: Up to date  Pneumococcal vaccine status: Up to date  Covid-19 vaccine status: Completed vaccines  Qualifies for Shingles Vaccine? Yes   Zostavax completed Yes   Shingrix Completed?: Yes  Screening Tests Health Maintenance  Topic Date Due   COVID-19 Vaccine (6 - 2023-24 season) 04/08/2022   INFLUENZA VACCINE  03/09/2023   Medicare Annual Wellness (AWV)  01/03/2024   DTaP/Tdap/Td (2 - Td or Tdap) 01/25/2025   Pneumonia Vaccine 58+ Years old  Completed   DEXA SCAN  Completed   Zoster Vaccines- Shingrix  Completed   HPV VACCINES  Aged Out    Health Maintenance  Health Maintenance Due  Topic Date Due   COVID-19 Vaccine (6 - 2023-24 season) 04/08/2022    Colorectal cancer screening: No longer required.   Mammogram status: Completed 04/25/2022. Repeat every year  Bone Density status: Ordered 01/03/2023. Pt provided with contact info and advised to call to schedule appt.  Lung Cancer Screening: (Low Dose CT Chest recommended if Age 37-80 years, 30 pack-year currently smoking OR have quit w/in 15years.) does not qualify.   Lung Cancer Screening Referral: no  Additional Screening:  Hepatitis C Screening: does not qualify; Completed: no  Vision Screening: Recommended annual ophthalmology exams for early detection of glaucoma and other disorders of the eye. Is the patient up to date with their annual eye exam?  Yes  Who is the provider or what is the name of the office in which the patient attends annual eye exams? [facility] If pt is not established with a provider,  would they like to be referred to a provider to establish care? No .   Dental Screening: Recommended annual dental exams for proper oral hygiene  Community Resource Referral / Chronic Care Management: CRR required this visit?  No   CCM required this visit?  No      Plan:     I have personally reviewed and noted the following in the patient's chart:   Medical and social history Use of alcohol, tobacco or illicit drugs  Current medications and supplements including opioid prescriptions. Patient is  not currently taking opioid prescriptions. Functional ability and status Nutritional status Physical activity Advanced directives List of other physicians Hospitalizations, surgeries, and ER visits in previous 12 months Vitals Screenings to include cognitive, depression, and falls Referrals and appointments  In addition, I have reviewed and discussed with patient certain preventive protocols, quality metrics, and best practice recommendations. A written personalized care plan for preventive services as well as general preventive health recommendations were provided to patient.     Mickeal Needy, LPN   1/61/0960   Nurse Notes: Normal cognitive status assessed by direct observation via telephone conversation by this Nurse Health Advisor. No abnormalities found.

## 2023-01-03 NOTE — Patient Instructions (Addendum)
Ms. Rebecca Guzman , Thank you for taking time to come for your Medicare Wellness Visit. I appreciate your ongoing commitment to your health goals. Please review the following plan we discussed and let me know if I can assist you in the future.   These are the goals we discussed:  Goals      My goal for 2024 is to continue to maintain my health by staying independent and active.  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        This is a list of the screening recommended for you and due dates:  Health Maintenance  Topic Date Due   COVID-19 Vaccine (6 - 2023-24 season) 04/08/2022   Flu Shot  03/09/2023   Medicare Annual Wellness Visit  01/03/2024   DTaP/Tdap/Td vaccine (2 - Td or Tdap) 01/25/2025   Pneumonia Vaccine  Completed   DEXA scan (bone density measurement)  Completed   Zoster (Shingles) Vaccine  Completed   HPV Vaccine  Aged Out    Advanced directives: Yes; documents are on file with [place].  Conditions/risks identified: Yes  Next appointment: Follow up in one year for your annual wellness visit.   Preventive Care 40 Years and Older, Female Preventive care refers to lifestyle choices and visits with your health care provider that can promote health and wellness. What does preventive care include? A yearly physical exam. This is also called an annual well check. Dental exams once or twice a year. Routine eye exams. Ask your health care provider how often you should have your eyes checked. Personal lifestyle choices, including: Daily care of your teeth and gums. Regular physical activity. Eating a healthy diet. Avoiding tobacco and drug use. Limiting alcohol use. Practicing safe sex. Taking low-dose aspirin every day. Taking vitamin and mineral supplements as recommended by your health care provider. What happens during an annual well check? The services and screenings done by your health care provider during your annual well check will depend on your age, overall  health, lifestyle risk factors, and family history of disease. Counseling  Your health care provider may ask you questions about your: Alcohol use. Tobacco use. Drug use. Emotional well-being. Home and relationship well-being. Sexual activity. Eating habits. History of falls. Memory and ability to understand (cognition). Work and work Astronomer. Reproductive health. Screening  You may have the following tests or measurements: Height, weight, and BMI. Blood pressure. Lipid and cholesterol levels. These may be checked every 5 years, or more frequently if you are over 59 years old. Skin check. Lung cancer screening. You may have this screening every year starting at age 36 if you have a 30-pack-year history of smoking and currently smoke or have quit within the past 15 years. Fecal occult blood test (FOBT) of the stool. You may have this test every year starting at age 3. Flexible sigmoidoscopy or colonoscopy. You may have a sigmoidoscopy every 5 years or a colonoscopy every 10 years starting at age 47. Hepatitis C blood test. Hepatitis B blood test. Sexually transmitted disease (STD) testing. Diabetes screening. This is done by checking your blood sugar (glucose) after you have not eaten for a while (fasting). You may have this done every 1-3 years. Bone density scan. This is done to screen for osteoporosis. You may have this done starting at age 33. Mammogram. This may be done every 1-2 years. Talk to your health care provider about how often you should have regular mammograms. Talk with your health care provider about  your test results, treatment options, and if necessary, the need for more tests. Vaccines  Your health care provider may recommend certain vaccines, such as: Influenza vaccine. This is recommended every year. Tetanus, diphtheria, and acellular pertussis (Tdap, Td) vaccine. You may need a Td booster every 10 years. Zoster vaccine. You may need this after age  71. Pneumococcal 13-valent conjugate (PCV13) vaccine. One dose is recommended after age 72. Pneumococcal polysaccharide (PPSV23) vaccine. One dose is recommended after age 45. Talk to your health care provider about which screenings and vaccines you need and how often you need them. This information is not intended to replace advice given to you by your health care provider. Make sure you discuss any questions you have with your health care provider. Document Released: 08/21/2015 Document Revised: 04/13/2016 Document Reviewed: 05/26/2015 Elsevier Interactive Patient Education  2017 [employer].  Fall Prevention in the Home Falls can cause injuries. They can happen to people of all ages. There are many things you can do to make your home safe and to help prevent falls. What can I do on the outside of my home? Regularly fix the edges of walkways and driveways and fix any cracks. Remove anything that might make you trip as you walk through a door, such as a raised step or threshold. Trim any bushes or trees on the path to your home. Use bright outdoor lighting. Clear any walking paths of anything that might make someone trip, such as rocks or tools. Regularly check to see if handrails are loose or broken. Make sure that both sides of any steps have handrails. Any raised decks and porches should have guardrails on the edges. Have any leaves, snow, or ice cleared regularly. Use sand or salt on walking paths during winter. Clean up any spills in your garage right away. This includes oil or grease spills. What can I do in the bathroom? Use night lights. Install grab bars by the toilet and in the tub and shower. Do not use towel bars as grab bars. Use non-skid mats or decals in the tub or shower. If you need to sit down in the shower, use a plastic, non-slip stool. Keep the floor dry. Clean up any water that spills on the floor as soon as it happens. Remove soap buildup in the tub or shower  regularly. Attach bath mats securely with double-sided non-slip rug tape. Do not have throw rugs and other things on the floor that can make you trip. What can I do in the bedroom? Use night lights. Make sure that you have a light by your bed that is easy to reach. Do not use any sheets or blankets that are too big for your bed. They should not hang down onto the floor. Have a firm chair that has side arms. You can use this for support while you get dressed. Do not have throw rugs and other things on the floor that can make you trip. What can I do in the kitchen? Clean up any spills right away. Avoid walking on wet floors. Keep items that you use a lot in easy-to-reach places. If you need to reach something above you, use a strong step stool that has a grab bar. Keep electrical cords out of the way. Do not use floor polish or wax that makes floors slippery. If you must use wax, use non-skid floor wax. Do not have throw rugs and other things on the floor that can make you trip. What can I do with  my stairs? Do not leave any items on the stairs. Make sure that there are handrails on both sides of the stairs and use them. Fix handrails that are broken or loose. Make sure that handrails are as long as the stairways. Check any carpeting to make sure that it is firmly attached to the stairs. Fix any carpet that is loose or worn. Avoid having throw rugs at the top or bottom of the stairs. If you do have throw rugs, attach them to the floor with carpet tape. Make sure that you have a light switch at the top of the stairs and the bottom of the stairs. If you do not have them, ask someone to add them for you. What else can I do to help prevent falls? Wear shoes that: Do not have high heels. Have rubber bottoms. Are comfortable and fit you well. Are closed at the toe. Do not wear sandals. If you use a stepladder: Make sure that it is fully opened. Do not climb a closed stepladder. Make sure that  both sides of the stepladder are locked into place. Ask someone to hold it for you, if possible. Clearly mark and make sure that you can see: Any grab bars or handrails. First and last steps. Where the edge of each step is. Use tools that help you move around (mobility aids) if they are needed. These include: Canes. Walkers. Scooters. Crutches. Turn on the lights when you go into a dark area. Replace any light bulbs as soon as they burn out. Set up your furniture so you have a clear path. Avoid moving your furniture around. If any of your floors are uneven, fix them. If there are any pets around you, be aware of where they are. Review your medicines with your doctor. Some medicines can make you feel dizzy. This can increase your chance of falling. Ask your doctor what other things that you can do to help prevent falls. This information is not intended to replace advice given to you by your health care provider. Make sure you discuss any questions you have with your health care provider. Document Released: 05/21/2009 Document Revised: 12/31/2015 Document Reviewed: 08/29/2014 Elsevier Interactive Patient Education  2017 [employer].

## 2023-01-11 DIAGNOSIS — N390 Urinary tract infection, site not specified: Secondary | ICD-10-CM | POA: Diagnosis not present

## 2023-01-11 DIAGNOSIS — N8182 Incompetence or weakening of pubocervical tissue: Secondary | ICD-10-CM | POA: Diagnosis not present

## 2023-01-23 ENCOUNTER — Other Ambulatory Visit: Payer: Self-pay

## 2023-01-23 DIAGNOSIS — I491 Atrial premature depolarization: Secondary | ICD-10-CM

## 2023-01-23 DIAGNOSIS — I10 Essential (primary) hypertension: Secondary | ICD-10-CM

## 2023-01-23 DIAGNOSIS — R002 Palpitations: Secondary | ICD-10-CM

## 2023-01-23 DIAGNOSIS — E782 Mixed hyperlipidemia: Secondary | ICD-10-CM

## 2023-01-23 DIAGNOSIS — Z9221 Personal history of antineoplastic chemotherapy: Secondary | ICD-10-CM

## 2023-01-23 MED ORDER — ROSUVASTATIN CALCIUM 5 MG PO TABS: 5.0000 mg | ORAL_TABLET | ORAL | 0 refills | Status: DC

## 2023-01-25 DIAGNOSIS — Z86006 Personal history of melanoma in-situ: Secondary | ICD-10-CM | POA: Diagnosis not present

## 2023-01-25 DIAGNOSIS — Z85820 Personal history of malignant melanoma of skin: Secondary | ICD-10-CM | POA: Diagnosis not present

## 2023-01-25 DIAGNOSIS — L821 Other seborrheic keratosis: Secondary | ICD-10-CM | POA: Diagnosis not present

## 2023-01-25 DIAGNOSIS — L578 Other skin changes due to chronic exposure to nonionizing radiation: Secondary | ICD-10-CM | POA: Diagnosis not present

## 2023-01-25 DIAGNOSIS — D2262 Melanocytic nevi of left upper limb, including shoulder: Secondary | ICD-10-CM | POA: Diagnosis not present

## 2023-01-25 DIAGNOSIS — D2271 Melanocytic nevi of right lower limb, including hip: Secondary | ICD-10-CM | POA: Diagnosis not present

## 2023-01-25 DIAGNOSIS — D225 Melanocytic nevi of trunk: Secondary | ICD-10-CM | POA: Diagnosis not present

## 2023-01-25 DIAGNOSIS — L720 Epidermal cyst: Secondary | ICD-10-CM | POA: Diagnosis not present

## 2023-01-25 DIAGNOSIS — Z86018 Personal history of other benign neoplasm: Secondary | ICD-10-CM | POA: Diagnosis not present

## 2023-01-25 DIAGNOSIS — D0339 Melanoma in situ of other parts of face: Secondary | ICD-10-CM | POA: Diagnosis not present

## 2023-02-11 ENCOUNTER — Other Ambulatory Visit: Payer: Self-pay | Admitting: Internal Medicine

## 2023-02-11 DIAGNOSIS — R079 Chest pain, unspecified: Secondary | ICD-10-CM

## 2023-02-11 DIAGNOSIS — K219 Gastro-esophageal reflux disease without esophagitis: Secondary | ICD-10-CM

## 2023-02-11 DIAGNOSIS — R1013 Epigastric pain: Secondary | ICD-10-CM

## 2023-02-11 NOTE — Progress Notes (Unsigned)
Cardiology Office Note:    Date:  02/13/2023   ID:  Rebecca Guzman, DOB 1934-01-13, MRN [id number]  PCP:  Myrlene Broker, MD   [facility] Providers Cardiologist:  Tobias Alexander, MD {   Referring MD: Myrlene Broker, *     History of Present Illness:    Rebecca Guzman is a 89 y.o. female with a hx of asthma, GERD, HLD and palpitations who was previously followed by Dr. Delton See who now presents to clinic for follow-up.  Per review of the record, the patient has been followed by Dr. Delton See for palpitations. In 2018 she underwent Holter monitoring that showed frequent PACs (700 in 34 hrs) and few very short runs of atrial tachycardia with the longest lasting 4 beats. She was started on metoprolol 12.5 mg by mouth twice a day with significant improvement of symptoms. She also had a TTE that showed LVEF 60-65% and grade 1 diastolic dysfunction, trivial MR, normal left atrial size and mild TR. her metoprolol was later increased to 25 mg p.o. twice daily with improvement of her symptoms.  The patient also had breast cancer 1992 s/p chemo and mastectomy but no radiation at that time. She was diagnosed with recurrent breast cancer 10/2017 which was very localized; she is in remission and is off tamoxifen.  No chemo or radiation at that time.  TTE with strain was obtained which showed LVEF 58%, G1DD, GLS -20.5%, normal RV, trivial MR, RAP 3.  Was last seen in clinic on 12/2021 where she was doing very well from a CV standpoint.  Today, the patient overall feels well.Marland Kitchen Has been suffering more with allergies this year and has noticed worsening SOB since the spring time. Symptoms improve with her nasal spray and allegra. Symptoms can occur at rest and with exertion. No associated chest pain, lightheadedness, or palpitations. Otherwise doing well from CV standpoint. No chest pain, orthopnea, PND or LE edema.  Blood pressure well controlled. Tolerating medications as  prescribed.   Past Medical History:  Diagnosis Date   Asthma    BPPV (benign paroxysmal positional vertigo)    Breast cancer (HCC)    left mastectomy 1992, right lumpectomy 2019   Cancer (HCC)    Compression fracture of L3 lumbar vertebra 04/06/2015   Dysrhythmia 2019   pt states Cardiologist told her it is "pre A-fib"   Family history of breast cancer    Family history of colon cancer    Family history of pancreatic cancer    GERD (gastroesophageal reflux disease)    Hiatal hernia    pt states she doesnt have this   History of adenomatous polyp of colon    tubular adenoma 2005 and  2014 tubular adenoma and  hyperplastic polyp   History of breast cancer per pt no recurrence   dx 1992 --  s/p  left breast mastectomy and chemotherapy   Hyperlipidemia LDL goal <100    Osteoarthritis of right knee    Personal history of chemotherapy 1992   PONV (postoperative nausea and vomiting)    and "gets weak and light-leaded"   Sensation of pressure in bladder area    Vaginal vault prolapse    anterior   Wears glasses    Wears hearing aid    left only    Past Surgical History:  Procedure Laterality Date   BREAST BIOPSY Right 04/19/2018   BREAST LUMPECTOMY Right 05/17/2018   BREAST LUMPECTOMY WITH RADIOACTIVE SEED LOCALIZATION Right 05/17/2018   Procedure: RIGHT  BREAST LUMPECTOMY WITH RADIOACTIVE SEED LOCALIZATION;  Surgeon: Almond Lint, MD;  Location: [facility] OR;  Service: General;  Laterality: Right;   CATARACT EXTRACTION W/ INTRAOCULAR LENS  IMPLANT, BILATERAL  2014   CHOLECYSTECTOMY  1985   COLONOSCOPY  last one 06-04-2013   CYSTOCELE REPAIR N/A 02/29/2016   Procedure: ANTERIOR VAULT PROLAPSE REPAIR COLOPLAST SACROSPINUS FIXATION AUGMENTED AXIS DERMIS REPAIR  ;  Surgeon: Jethro Bolus, MD;  Location: [facility];  Service: Urology;  Laterality: N/A;   ESOPHAGOGASTRODUODENOSCOPY  08-31-2009   LAPAROSCOPIC ASSISTED VAGINAL HYSTERECTOMY  01-10-2000   w/ Bilateral  Salpingoophorectomy /  Anterior & Posterior repair/  Pubovaginal Sling   MASTECTOMY Left 1992   TOTAL KNEE ARTHROPLASTY Right 05/21/2021   Procedure: RIGHT TOTAL KNEE ARTHROPLASTY;  Surgeon: Eldred Manges, MD;  Location: [facility] OR;  Service: Orthopedics;  Laterality: Right;    Current Medications: Current Meds  Medication Sig   acetaminophen (TYLENOL) 500 MG tablet Take 1 tablet (500 mg total) by mouth 2 (two) times daily as needed.   azelastine (OPTIVAR) 0.05 % ophthalmic solution 1 drop 2 (two) times daily.   Calcium Carbonate-Vit D-Min (CALCIUM 600+D3 PLUS MINERALS) 600-800 MG-UNIT TABS Take 2 tablets by mouth daily.   denosumab (PROLIA) 60 MG/ML SOSY injection Inject 60 mg into the skin every 6 (six) months.   fexofenadine (ALLEGRA) 180 MG tablet Take 180 mg by mouth daily.   ipratropium (ATROVENT) 0.03 % nasal spray Place into both nostrils.   Multiple Vitamin (MULTIVITAMIN) tablet Take 1 tablet by mouth daily.   Multiple Vitamins-Minerals (PRESERVISION AREDS 2 PO) Take 1 tablet by mouth daily.   Omega-3 Fatty Acids (FISH OIL) 1000 MG CAPS Take 1 capsule (1,000 mg total) by mouth daily.   omeprazole (PRILOSEC) 40 MG capsule TAKE 1 CAPSULE(40 MG) BY MOUTH DAILY   Probiotic Product (PROBIOTIC FORMULA PO) Take 1 tablet by mouth every evening.    rosuvastatin (CRESTOR) 5 MG tablet Take 1 tablet (5 mg total) by mouth 4 (four) times a week.   [DISCONTINUED] metoprolol tartrate (LOPRESSOR) 25 MG tablet TAKE 1 TABLET(25 MG) BY MOUTH TWICE DAILY     Allergies:   Codeine and Morphine and codeine   Social History   Socioeconomic History   Marital status: Widowed    Spouse name: Not on file   Number of children: 4   Years of education: Not on file   Highest education level: Not on file  Occupational History   Occupation: Retired Magazine features editor: RETIRED  Tobacco Use   Smoking status: Former    Years: 13    Types: Cigarettes    Quit date: 10/12/1983    Years since quitting: 39.3    Smokeless tobacco: Never  Vaping Use   Vaping Use: Never used  Substance and Sexual Activity   Alcohol use: No   Drug use: No   Sexual activity: Not on file  Other Topics Concern   Not on file  Social History Narrative   Not on file   Social Determinants of Health   Financial Resource Strain: Low Risk  (01/03/2023)   Overall Financial Resource Strain (CARDIA)    Difficulty of Paying Living Expenses: Not hard at all  Food Insecurity: No Food Insecurity (01/03/2023)   Hunger Vital Sign    Worried About Running Out of Food in the Last Year: Never true    Ran Out of Food in the Last Year: Never true  Transportation Needs: No Transportation Needs (01/03/2023)  PRAPARE - Administrator, Civil Service (Medical): No    Lack of Transportation (Non-Medical): No  Physical Activity: Sufficiently Active (01/03/2023)   Exercise Vital Sign    Days of Exercise per Week: 5 days    Minutes of Exercise per Session: 30 min  Stress: No Stress Concern Present (01/03/2023)   [employer] of Occupational Health - Occupational Stress Questionnaire    Feeling of Stress : Only a little  Social Connections: Moderately Integrated (01/03/2023)   Social Connection and Isolation Panel [NHANES]    Frequency of Communication with Friends and Family: More than three times a week    Frequency of Social Gatherings with Friends and Family: More than three times a week    Attends Religious Services: More than 4 times per year    Active Member of [employer] or Organizations: Yes    Attends Engineer, structural: More than 4 times per year    Marital Status: Divorced     Family History: The patient's family history includes Breast cancer in her sister; Colon cancer (age of onset: 54) in her mother; Heart attack in her mother; Heart disease in her father; Hypertension in her mother; Pancreatic cancer (age of onset: 76) in an other family member.  ROS:   Please see the history of present illness.       EKGs/Labs/Other Studies Reviewed:    The following studies were reviewed today: Cardiac Studies & Procedures       ECHOCARDIOGRAM  ECHOCARDIOGRAM COMPLETE 01/20/2021  Narrative ECHOCARDIOGRAM REPORT    Patient Name:   Rebecca Guzman Date of Exam: 01/20/2021 Medical Rec #:  [id number]       Height:       65.0 in Accession #:    [id number]      Weight:       171.8 lb Date of Birth:  [birth year]       BSA:          1.854 m Patient Age:    87 years        BP:           126/76 mmHg Patient Gender: F               HR:           73 bpm. Exam Location:  [address]  Procedure: 2D Echo, 3D Echo, Cardiac Doppler, Color Doppler and Strain Analysis  Indications:    Z92.21 H/o Chemotherapy  History:        Patient has prior history of Echocardiogram examinations, most recent 11/15/2016. Arrythmias:Palpitation; Risk Factors:Dyslipidemia and Former Smoker. H/o breast cancer.  Sonographer:    Garald Braver, RDCS Referring Phys: [id number] Kathlynn Grate Coyle Stordahl  IMPRESSIONS   1. Left ventricular ejection fraction, by estimation, is 55 to 60%. Left ventricular ejection fraction by 3D volume is 58 %. The left ventricle has normal function. The left ventricle has no regional wall motion abnormalities. Left ventricular diastolic parameters are consistent with Grade I diastolic dysfunction (impaired relaxation). Elevated left ventricular end-diastolic pressure. The E/e' is 9. The average left ventricular global longitudinal strain is -20.5 %. The global longitudinal strain is normal. 2. Right ventricular systolic function is normal. The right ventricular size is normal. There is normal pulmonary artery systolic pressure. The estimated right ventricular systolic pressure is 33.5 mmHg. 3. The mitral valve is degenerative. Trivial mitral valve regurgitation. 4. The aortic valve is tricuspid. Aortic valve regurgitation is not visualized. Mild aortic valve  sclerosis is present, with no evidence of  aortic valve stenosis. 5. The inferior vena cava is normal in size with greater than 50% respiratory variability, suggesting right atrial pressure of 3 mmHg.  Comparison(s): 11/15/16: LVEF 60-65%, grade 1 DD, elevated LVEDP, RVSP 37 mmHg.  FINDINGS Left Ventricle: Left ventricular ejection fraction, by estimation, is 55 to 60%. Left ventricular ejection fraction by 3D volume is 58 %. The left ventricle has normal function. The left ventricle has no regional wall motion abnormalities. The average left ventricular global longitudinal strain is -20.5 %. The global longitudinal strain is normal. The left ventricular internal cavity size was normal in size. There is no left ventricular hypertrophy. Left ventricular diastolic parameters are consistent with Grade I diastolic dysfunction (impaired relaxation). Elevated left ventricular end-diastolic pressure. The E/e' is 25.  Right Ventricle: The right ventricular size is normal. No increase in right ventricular wall thickness. Right ventricular systolic function is normal. There is normal pulmonary artery systolic pressure. The tricuspid regurgitant velocity is 2.76 m/s, and with an assumed right atrial pressure of 3 mmHg, the estimated right ventricular systolic pressure is 33.5 mmHg.  Left Atrium: Left atrial size was normal in size.  Right Atrium: Right atrial size was normal in size.  Pericardium: There is no evidence of pericardial effusion.  Mitral Valve: The mitral valve is degenerative in appearance. There is mild thickening of the mitral valve leaflet(s). Trivial mitral valve regurgitation.  Tricuspid Valve: The tricuspid valve is grossly normal. Tricuspid valve regurgitation is trivial.  Aortic Valve: The aortic valve is tricuspid. Aortic valve regurgitation is not visualized. Mild aortic valve sclerosis is present, with no evidence of aortic valve stenosis.  Pulmonic Valve: The pulmonic valve was normal in structure. Pulmonic valve  regurgitation is not visualized.  Aorta: The aortic root and ascending aorta are structurally normal, with no evidence of dilitation.  Venous: The inferior vena cava is normal in size with greater than 50% respiratory variability, suggesting right atrial pressure of 3 mmHg.  IAS/Shunts: No atrial level shunt detected by color flow Doppler.   LEFT VENTRICLE PLAX 2D LVIDd:         4.10 cm         Diastology LVIDs:         2.90 cm         LV e' medial:    4.00 cm/s LV PW:         1.00 cm         LV E/e' medial:  22.5 LV IVS:        1.00 cm         LV e' lateral:   7.20 cm/s LVOT diam:     2.00 cm         LV E/e' lateral: 12.5 LV SV:         61 LV SV Index:   33              2D LVOT Area:     3.14 cm        Longitudinal Strain 2D Strain GLS  -20.0 % (A2C): 2D Strain GLS  -23.2 % (A3C): 2D Strain GLS  -18.2 % (A4C): 2D Strain GLS  -20.5 % Avg:  3D Volume EF LV 3D EF:    Left ventricular ejection fraction by 3D volume is 58 %.  3D Volume EF: 3D EF:        58 % LV EDV:       99 ml LV  ESV:       41 ml LV SV:        58 ml  RIGHT VENTRICLE RV Basal diam:  3.40 cm RV S prime:     8.50 cm/s TAPSE (M-mode): 1.6 cm RVSP:           33.5 mmHg  LEFT ATRIUM             Index       RIGHT ATRIUM           Index LA diam:        3.60 cm 1.94 cm/m  RA Pressure: 3.00 mmHg LA Vol (A2C):   37.0 ml 19.95 ml/m RA Area:     9.52 cm LA Vol (A4C):   46.3 ml 24.97 ml/m RA Volume:   17.30 ml  9.33 ml/m LA Biplane Vol: 42.7 ml 23.03 ml/m AORTIC VALVE LVOT Vmax:   86.20 cm/s LVOT Vmean:  57.700 cm/s LVOT VTI:    0.195 m  AORTA Ao Root diam: 3.40 cm Ao Asc diam:  3.60 cm  MITRAL VALVE               TRICUSPID VALVE TR Peak grad:   30.5 mmHg TR Vmax:        276.00 cm/s MV E velocity: 90.00 cm/s  Estimated RAP:  3.00 mmHg MV A velocity: 93.00 cm/s  RVSP:           33.5 mmHg MV E/A ratio:  0.97 SHUNTS Systemic VTI:  0.20 m Systemic Diam: 2.00 cm  Zoila Shutter  MD Electronically signed by Zoila Shutter MD Signature Date/Time: 01/20/2021/11:07:05 AM    Final              EKG:  NSR-personally reveiwed  Recent Labs: 09/21/2022: ALT 17; BUN 23; Creatinine, Ser 0.86; Hemoglobin 13.7; Platelets 228.0; Potassium 4.3; Sodium 139  Recent Lipid Panel    Component Value Date/Time   CHOL 170 09/21/2022 1035   CHOL 187 01/26/2016 0802   TRIG 124.0 09/21/2022 1035   HDL 61.50 09/21/2022 1035   HDL 59 01/26/2016 0802   CHOLHDL 3 09/21/2022 1035   VLDL 24.8 09/21/2022 1035   LDLCALC 83 09/21/2022 1035   LDLCALC 76 05/08/2020 0830   LDLDIRECT 78.0 03/29/2021 1441     Physical Exam:    VS:  BP 124/84   Ht 5\' 5"  (1.651 m)   Wt 171 lb 9.6 oz (77.8 kg)   LMP 11/21/1985   SpO2 97%   BMI 28.56 kg/m     Wt Readings from Last 3 Encounters:  02/13/23 171 lb 9.6 oz (77.8 kg)  01/03/23 165 lb (74.8 kg)  10/19/22 172 lb 12.8 oz (78.4 kg)     GEN:  Well nourished, well developed in no acute distress HEENT: Normal NECK: No JVD CARDIAC: RRR, 1/6 systolic murmur. No rubs or gallops. RESPIRATORY:  CTAB ABDOMEN: Soft, non-tender, non-distended MUSCULOSKELETAL:  No edema; No deformity  SKIN: Warm and dry NEUROLOGIC:  Alert and oriented x 3 PSYCHIATRIC:  Normal affect   ASSESSMENT:    1. PAC (premature atrial contraction)   2. Essential hypertension   3. Mixed hyperlipidemia   4. Malignant neoplasm of upper-inner quadrant of right breast in female, estrogen receptor positive (HCC)   5. History of chemotherapy   6. Palpitations   7. PVC's (premature ventricular contractions)   8. Hyperlipidemia LDL goal <100     PLAN:    In order of problems listed above:  #Palpitations: #PACs:  Holter monitoring that showed frequent PACs (700 in 34 hrs) and few very short runs of atrial tachycardia with the longest lasting 4 beats. TTE with LVEF 60-65%, grade 1 diastolic dysfunction, trivial MR, normal left atrial size and mild TR. Improved on  metop. -Continue metop 25mg  BID  #SOB: -Suspect this is related to allergies as symptoms improve with allegra and nasal spray -Will monitor symptoms and let us know if they progress  #HTN: Well controlled and at goal. -Continue metop 25mg  BID -Goal <120s/80s  #HLD: -LDL well controlled at 83 -Tolerating crestor 5mg  4x/week; renew as needed -Lipids per PCP  #Recurrent breast cancer: Had initial breast cancer in 1990s s/p mastectomy and chemo. No XRT. Had recurrent breast cancer in 2019. No s/p Right lumpectomy: IDC, 5.5 mm, grade 1, margins negative, ER 100%, PR 100%, HER-2 1+ by IHC, Ki-67 less than 1%, T1BNX stage Ia. Previously on tamoxifen, now off and in remission. -Management per Oncology    Medication Adjustments/Labs and Tests Ordered: Current medicines are reviewed at length with the patient today.  Concerns regarding medicines are outlined above.  Orders Placed This Encounter  Procedures   EKG 12-Lead   Meds ordered this encounter  Medications   metoprolol tartrate (LOPRESSOR) 25 MG tablet    Sig: TAKE 1 TABLET(25 MG) BY MOUTH TWICE DAILY    Dispense:  180 tablet    Refill:  2    Patient Instructions  Medication Instructions:   Your physician recommends that you continue on your current medications as directed. Please refer to the Current Medication list given to you today.  *If you need a refill on your cardiac medications before your next appointment, please call your pharmacy*    Follow-Up: At [facility], you and your health needs are our priority.  As part of our continuing mission to provide you with exceptional heart care, we have created designated Provider Care Teams.  These Care Teams include your primary Cardiologist (physician) and Advanced Practice Providers (APPs -  Physician Assistants and Nurse Practitioners) who all work together to provide you with the care you need, when you need it.  We recommend signing up for the patient portal  called "MyChart".  Sign up information is provided on this After Visit Summary.  MyChart is used to connect with patients for Virtual Visits (Telemedicine).  Patients are able to view lab/test results, encounter notes, upcoming appointments, etc.  Non-urgent messages can be sent to your provider as well.   To learn more about what you can do with MyChart, go to [email or web address].    Your next appointment:   6 month(s)  Provider:   DR. Epifanio Lesches       Signed, Meriam Sprague, MD  02/13/2023 3:37 PM    [facility]

## 2023-02-13 ENCOUNTER — Encounter: Payer: Self-pay | Admitting: Cardiology

## 2023-02-13 ENCOUNTER — Ambulatory Visit: Payer: Medicare PPO | Attending: Cardiology | Admitting: Cardiology

## 2023-02-13 VITALS — BP 124/84 | Ht 65.0 in | Wt 171.6 lb

## 2023-02-13 DIAGNOSIS — I493 Ventricular premature depolarization: Secondary | ICD-10-CM

## 2023-02-13 DIAGNOSIS — E782 Mixed hyperlipidemia: Secondary | ICD-10-CM | POA: Diagnosis not present

## 2023-02-13 DIAGNOSIS — I10 Essential (primary) hypertension: Secondary | ICD-10-CM | POA: Diagnosis not present

## 2023-02-13 DIAGNOSIS — I491 Atrial premature depolarization: Secondary | ICD-10-CM

## 2023-02-13 DIAGNOSIS — Z170 Estrogen receptor positive status [ER+]: Secondary | ICD-10-CM | POA: Diagnosis not present

## 2023-02-13 DIAGNOSIS — R002 Palpitations: Secondary | ICD-10-CM

## 2023-02-13 DIAGNOSIS — E785 Hyperlipidemia, unspecified: Secondary | ICD-10-CM | POA: Diagnosis not present

## 2023-02-13 DIAGNOSIS — C50211 Malignant neoplasm of upper-inner quadrant of right female breast: Secondary | ICD-10-CM | POA: Diagnosis not present

## 2023-02-13 DIAGNOSIS — Z9221 Personal history of antineoplastic chemotherapy: Secondary | ICD-10-CM | POA: Diagnosis not present

## 2023-02-13 MED ORDER — METOPROLOL TARTRATE 25 MG PO TABS: ORAL_TABLET | ORAL | 2 refills | Status: DC

## 2023-02-13 NOTE — Patient Instructions (Signed)
Medication Instructions:   Your physician recommends that you continue on your current medications as directed. Please refer to the Current Medication list given to you today.  *If you need a refill on your cardiac medications before your next appointment, please call your pharmacy*    Follow-Up: At [facility], you and your health needs are our priority.  As part of our continuing mission to provide you with exceptional heart care, we have created designated Provider Care Teams.  These Care Teams include your primary Cardiologist (physician) and Advanced Practice Providers (APPs -  Physician Assistants and Nurse Practitioners) who all work together to provide you with the care you need, when you need it.  We recommend signing up for the patient portal called "MyChart".  Sign up information is provided on this After Visit Summary.  MyChart is used to connect with patients for Virtual Visits (Telemedicine).  Patients are able to view lab/test results, encounter notes, upcoming appointments, etc.  Non-urgent messages can be sent to your provider as well.   To learn more about what you can do with MyChart, go to [email or web address].    Your next appointment:   6 month(s)  Provider:   DR. Epifanio Lesches

## 2023-02-15 ENCOUNTER — Telehealth: Payer: Self-pay | Admitting: Physical Medicine and Rehabilitation

## 2023-02-15 NOTE — Telephone Encounter (Signed)
Pt had a few questions bout an appt please advise she would like a call

## 2023-02-16 NOTE — Telephone Encounter (Signed)
Patient wanted to know if Dr. Alvester Morin can give her a steroid injection in her hands like he does her back. Informed patient that he cannot do that, but we have a hand surgeon coming soon and she can schedule with him. Patient stated she will call back to schedule.

## 2023-02-21 DIAGNOSIS — D0339 Melanoma in situ of other parts of face: Secondary | ICD-10-CM | POA: Diagnosis not present

## 2023-02-22 DIAGNOSIS — D0339 Melanoma in situ of other parts of face: Secondary | ICD-10-CM | POA: Diagnosis not present

## 2023-02-22 DIAGNOSIS — L905 Scar conditions and fibrosis of skin: Secondary | ICD-10-CM | POA: Diagnosis not present

## 2023-02-28 DIAGNOSIS — D0339 Melanoma in situ of other parts of face: Secondary | ICD-10-CM | POA: Diagnosis not present

## 2023-03-15 DIAGNOSIS — N8182 Incompetence or weakening of pubocervical tissue: Secondary | ICD-10-CM | POA: Diagnosis not present

## 2023-03-23 ENCOUNTER — Encounter (INDEPENDENT_AMBULATORY_CARE_PROVIDER_SITE_OTHER): Payer: Self-pay

## 2023-04-17 ENCOUNTER — Telehealth: Payer: Self-pay | Admitting: Internal Medicine

## 2023-04-17 ENCOUNTER — Other Ambulatory Visit: Payer: Self-pay

## 2023-04-17 DIAGNOSIS — Z9221 Personal history of antineoplastic chemotherapy: Secondary | ICD-10-CM

## 2023-04-17 DIAGNOSIS — I491 Atrial premature depolarization: Secondary | ICD-10-CM

## 2023-04-17 DIAGNOSIS — E782 Mixed hyperlipidemia: Secondary | ICD-10-CM

## 2023-04-17 DIAGNOSIS — I10 Essential (primary) hypertension: Secondary | ICD-10-CM

## 2023-04-17 DIAGNOSIS — R002 Palpitations: Secondary | ICD-10-CM

## 2023-04-17 MED ORDER — ROSUVASTATIN CALCIUM 5 MG PO TABS: 5.0000 mg | ORAL_TABLET | ORAL | 0 refills | Status: DC

## 2023-04-17 NOTE — Telephone Encounter (Signed)
Pt called inquiring about getting her prolia shot.

## 2023-04-19 ENCOUNTER — Ambulatory Visit: Payer: Medicare PPO

## 2023-04-19 DIAGNOSIS — M810 Age-related osteoporosis without current pathological fracture: Secondary | ICD-10-CM | POA: Diagnosis not present

## 2023-04-19 DIAGNOSIS — Z23 Encounter for immunization: Secondary | ICD-10-CM | POA: Diagnosis not present

## 2023-04-19 MED ORDER — DENOSUMAB 60 MG/ML ~~LOC~~ SOSY
60.0000 mg | PREFILLED_SYRINGE | Freq: Once | SUBCUTANEOUS | Status: AC
  Administered 2023-04-19: 60 mg via SUBCUTANEOUS

## 2023-04-19 NOTE — Telephone Encounter (Signed)
Pt is being seen today for her prolia

## 2023-04-19 NOTE — Progress Notes (Signed)
Pt received her prolia injection today and responded well to it.

## 2023-04-21 ENCOUNTER — Other Ambulatory Visit: Payer: Self-pay | Admitting: Internal Medicine

## 2023-04-21 DIAGNOSIS — R1013 Epigastric pain: Secondary | ICD-10-CM

## 2023-04-21 DIAGNOSIS — K219 Gastro-esophageal reflux disease without esophagitis: Secondary | ICD-10-CM

## 2023-04-21 DIAGNOSIS — R079 Chest pain, unspecified: Secondary | ICD-10-CM

## 2023-04-27 ENCOUNTER — Ambulatory Visit
Admission: RE | Admit: 2023-04-27 | Discharge: 2023-04-27 | Disposition: A | Discharge status: Home / Self Care | Payer: Medicare PPO | Source: Ambulatory Visit | Attending: Hematology and Oncology | Admitting: Hematology and Oncology

## 2023-04-27 ENCOUNTER — Other Ambulatory Visit: Payer: Self-pay | Admitting: Hematology and Oncology

## 2023-04-27 DIAGNOSIS — L578 Other skin changes due to chronic exposure to nonionizing radiation: Secondary | ICD-10-CM | POA: Diagnosis not present

## 2023-04-27 DIAGNOSIS — D2271 Melanocytic nevi of right lower limb, including hip: Secondary | ICD-10-CM | POA: Diagnosis not present

## 2023-04-27 DIAGNOSIS — D225 Melanocytic nevi of trunk: Secondary | ICD-10-CM | POA: Diagnosis not present

## 2023-04-27 DIAGNOSIS — Z86018 Personal history of other benign neoplasm: Secondary | ICD-10-CM | POA: Diagnosis not present

## 2023-04-27 DIAGNOSIS — R921 Mammographic calcification found on diagnostic imaging of breast: Secondary | ICD-10-CM | POA: Diagnosis not present

## 2023-04-27 DIAGNOSIS — D485 Neoplasm of uncertain behavior of skin: Secondary | ICD-10-CM | POA: Diagnosis not present

## 2023-04-27 DIAGNOSIS — Z86006 Personal history of melanoma in-situ: Secondary | ICD-10-CM | POA: Diagnosis not present

## 2023-04-27 DIAGNOSIS — R928 Other abnormal and inconclusive findings on diagnostic imaging of breast: Secondary | ICD-10-CM

## 2023-04-27 DIAGNOSIS — D2262 Melanocytic nevi of left upper limb, including shoulder: Secondary | ICD-10-CM | POA: Diagnosis not present

## 2023-04-27 DIAGNOSIS — Z85820 Personal history of malignant melanoma of skin: Secondary | ICD-10-CM | POA: Diagnosis not present

## 2023-04-27 DIAGNOSIS — L821 Other seborrheic keratosis: Secondary | ICD-10-CM | POA: Diagnosis not present

## 2023-04-27 DIAGNOSIS — Z853 Personal history of malignant neoplasm of breast: Secondary | ICD-10-CM | POA: Diagnosis not present

## 2023-04-28 ENCOUNTER — Other Ambulatory Visit: Payer: Self-pay

## 2023-04-28 ENCOUNTER — Telehealth: Payer: Self-pay | Admitting: Internal Medicine

## 2023-04-28 DIAGNOSIS — R1013 Epigastric pain: Secondary | ICD-10-CM

## 2023-04-28 DIAGNOSIS — R079 Chest pain, unspecified: Secondary | ICD-10-CM

## 2023-04-28 DIAGNOSIS — K219 Gastro-esophageal reflux disease without esophagitis: Secondary | ICD-10-CM

## 2023-04-28 MED ORDER — OMEPRAZOLE 40 MG PO CPDR: DELAYED_RELEASE_CAPSULE | ORAL | 3 refills | Status: DC

## 2023-04-28 NOTE — Telephone Encounter (Signed)
Pt called wanting her medications going to this pharmacist below.  [id number] Gin[place] Otto, [place] - [address] AT [facility] [address] [facility] [address] [place] [address] Phone: [phone] Fax: [phone]

## 2023-04-28 NOTE — Telephone Encounter (Signed)
Prescription Request  04/28/2023  LOV: 09/21/2022  What is the name of the medication or equipment?  omeprazole (PRILOSEC) 40 MG capsule    Have you contacted your pharmacy to request a refill? No   Which pharmacy would you like this sent to?    [id number] Gin[place] Otto, [place] - [address] AT [facility] [address] [facility] [address] [place] [address] Phone: [phone] Fax: [phone]  CPatient notified that their request is being sent to the clinical staff for review and that they should receive a response within 2 business days.   Please advise at Mobile [phone] (mobile)

## 2023-05-01 ENCOUNTER — Other Ambulatory Visit: Payer: Self-pay

## 2023-05-01 DIAGNOSIS — R1013 Epigastric pain: Secondary | ICD-10-CM

## 2023-05-01 DIAGNOSIS — R079 Chest pain, unspecified: Secondary | ICD-10-CM

## 2023-05-01 DIAGNOSIS — K219 Gastro-esophageal reflux disease without esophagitis: Secondary | ICD-10-CM

## 2023-05-01 MED ORDER — OMEPRAZOLE 40 MG PO CPDR
DELAYED_RELEASE_CAPSULE | ORAL | 3 refills | Status: DC
Start: 2023-05-01 — End: 2023-09-25

## 2023-05-10 ENCOUNTER — Other Ambulatory Visit: Payer: Self-pay | Admitting: Cardiology

## 2023-05-10 DIAGNOSIS — Z9221 Personal history of antineoplastic chemotherapy: Secondary | ICD-10-CM

## 2023-05-10 DIAGNOSIS — I10 Essential (primary) hypertension: Secondary | ICD-10-CM

## 2023-05-10 DIAGNOSIS — R002 Palpitations: Secondary | ICD-10-CM

## 2023-05-10 DIAGNOSIS — E782 Mixed hyperlipidemia: Secondary | ICD-10-CM

## 2023-05-10 DIAGNOSIS — I491 Atrial premature depolarization: Secondary | ICD-10-CM

## 2023-05-15 ENCOUNTER — Ambulatory Visit
Admission: RE | Admit: 2023-05-15 | Discharge: 2023-05-15 | Disposition: A | Discharge status: Home / Self Care | Payer: Medicare PPO | Source: Ambulatory Visit | Attending: Hematology and Oncology

## 2023-05-15 ENCOUNTER — Ambulatory Visit
Admission: RE | Admit: 2023-05-15 | Discharge: 2023-05-15 | Disposition: A | Discharge status: Home / Self Care | Payer: Medicare PPO | Source: Ambulatory Visit | Attending: Hematology and Oncology | Admitting: Hematology and Oncology

## 2023-05-15 DIAGNOSIS — N6312 Unspecified lump in the right breast, upper inner quadrant: Secondary | ICD-10-CM | POA: Diagnosis not present

## 2023-05-15 DIAGNOSIS — R928 Other abnormal and inconclusive findings on diagnostic imaging of breast: Secondary | ICD-10-CM

## 2023-05-15 DIAGNOSIS — D0511 Intraductal carcinoma in situ of right breast: Secondary | ICD-10-CM | POA: Diagnosis not present

## 2023-05-15 HISTORY — PX: BREAST BIOPSY: SHX20

## 2023-05-16 DIAGNOSIS — J3089 Other allergic rhinitis: Secondary | ICD-10-CM | POA: Diagnosis not present

## 2023-05-16 DIAGNOSIS — R052 Subacute cough: Secondary | ICD-10-CM | POA: Diagnosis not present

## 2023-05-16 DIAGNOSIS — J300 Vasomotor rhinitis: Secondary | ICD-10-CM | POA: Diagnosis not present

## 2023-05-16 LAB — SURGICAL PATHOLOGY

## 2023-05-19 ENCOUNTER — Telehealth: Payer: Self-pay | Admitting: Adult Health

## 2023-05-19 NOTE — Telephone Encounter (Signed)
Per staff message sent on 10/11 patient is needing appointment to review path report; left patient a message regarding schedulked appointment times/dates for follow up appointment also left callback if needing to reschedule appointment

## 2023-05-23 ENCOUNTER — Encounter: Payer: Self-pay | Admitting: Hematology and Oncology

## 2023-05-23 DIAGNOSIS — C50211 Malignant neoplasm of upper-inner quadrant of right female breast: Secondary | ICD-10-CM | POA: Diagnosis not present

## 2023-05-23 DIAGNOSIS — Z170 Estrogen receptor positive status [ER+]: Secondary | ICD-10-CM | POA: Diagnosis not present

## 2023-05-23 DIAGNOSIS — Z809 Family history of malignant neoplasm, unspecified: Secondary | ICD-10-CM | POA: Diagnosis not present

## 2023-05-23 DIAGNOSIS — Z853 Personal history of malignant neoplasm of breast: Secondary | ICD-10-CM | POA: Diagnosis not present

## 2023-05-24 DIAGNOSIS — N8182 Incompetence or weakening of pubocervical tissue: Secondary | ICD-10-CM | POA: Diagnosis not present

## 2023-05-25 ENCOUNTER — Telehealth: Payer: Self-pay | Admitting: Hematology and Oncology

## 2023-05-25 NOTE — Telephone Encounter (Signed)
Spoke with patient confirming upcoming appointments  

## 2023-05-26 ENCOUNTER — Telehealth: Payer: Self-pay

## 2023-05-26 NOTE — Telephone Encounter (Signed)
LVM to return call to schedule OV 

## 2023-05-26 NOTE — Telephone Encounter (Signed)
Patient called in requesting another injection. Last injection in 01/24 lasted about 4 weeks and she may have gotten 50% it. No new falls, accidents or injuries. Please advise

## 2023-05-29 DIAGNOSIS — L905 Scar conditions and fibrosis of skin: Secondary | ICD-10-CM | POA: Diagnosis not present

## 2023-05-29 DIAGNOSIS — D239 Other benign neoplasm of skin, unspecified: Secondary | ICD-10-CM | POA: Diagnosis not present

## 2023-05-29 NOTE — Progress Notes (Incomplete)
Location of Breast Cancer:right breast  SCIS Histology per Pathology Report:    Receptor Status: ER(100), PR (100),   Did patient present with symptoms (if so, please note symptoms) or was this found on screening mammography?: mammogram   Past/Anticipated interventions by surgeon, if any:{t:21944} ***  Past/Anticipated interventions by medical oncology, if any: Chemotherapy     Lymphedema issues, if any:  {:18581} {t:21944}   Pain issues, if any:  {:18581} {PAIN DESCRIPTION:[id number]}  SAFETY ISSUES: Prior radiation? {:18581} Pacemaker/ICD? {:18581} Possible current pregnancy?{:18581} Is the patient on methotrexate? {:18581}  Current Complaints / other details:  ***

## 2023-05-30 ENCOUNTER — Telehealth: Payer: Self-pay | Admitting: Physical Medicine and Rehabilitation

## 2023-05-30 NOTE — Telephone Encounter (Signed)
Patient called to make an appointment for the steroid injection in her back. CB#[phone]

## 2023-05-30 NOTE — Progress Notes (Signed)
Radiation Oncology         [phone] ________________________________  Initial Outpatient Consultation  Name: Rebecca Guzman MRN: [id number]  Date: 05/31/2023  DOB: June 05, 1934  ON:GEXBMWUX, Austin Miles, MD  Almond Lint, MD   REFERRING PHYSICIAN: Almond Lint, MD  DIAGNOSIS: {There were no encounter diagnoses. (Refresh or delete this SmartLink)}  Stage 0 (cTis (DCIS), cN0, cM0) Right Breast UIQ, Intermediate grade DCIS, ER+ / PR+ / Her2 not assessed   History of left breast cancer in 1992 s/p mastectomy and chemotherapy, and right breast cancer diagnosed in 2019 s/p lumpectomy and antiestrogen therapy (no prior history of radiation to either breast)   HISTORY OF PRESENT ILLNESS::Rebecca Guzman is a 89 y.o. female who is accompanied by ***. she is seen as a courtesy of Dr. Donell Beers for an opinion concerning radiation therapy as part of management for her recently diagnosed right breast DCIS. She has a past oncologic history notable for left breast cancer diagnosed in 1992, s/p mastectomy and chemotherapy, and right breast cancer (grade 1 IDC and DCIS / ER/PR+) diagnosed in 2019 s/p lumpectomy and antiestrogen therapy (initially letrozole which was switched to letrozole in 2020, and then switched to anastrozole, followed by tamoxifen). She has no prior history of radiation to either breast.  (I did see her in consultation in 2019 for consideration of XRT to the right breast which she declined).   In the setting of her breast cancer history, she presented for an annual diagnostic mammogram of the right breast on 04/27/23 which demonstrated indeterminate calcifications spanning 6 mm in the anterior / inferior aspect of the lumpectomy site in the right breast.   Biopsy of the upper inner right breast on 05/15/23 showed intermediate grade DCIS measuring 3 mm in the greatest linear extent of the sample. Prognostic indicators significant for: estrogen receptor 100% positive and progesterone  receptor 100% positive, both with strong staining intensity; Her2 not assessed.   She was accordingly seen in consultation by Dr. Donell Beers on 05/23/23. She remains undecided if she would like to proceed with a right mastectomy or breast conserving surgery and will follow-up with Dr. Donell Beers regarding her decision in the near future.   She is also scheduled to meet with Dr. Al Pimple on 06/01/23 to discuss antiestrogen treatment options.    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"   Family history of breast cancer    Family history of colon cancer    Family history of pancreatic cancer    GERD (gastroesophageal reflux disease)    Hiatal hernia    pt states she doesnt have this   History of adenomatous polyp of colon    tubular adenoma 2005 and  2014 tubular adenoma and  hyperplastic polyp   History of breast cancer per pt no recurrence   dx 1992 --  s/p  left breast mastectomy and chemotherapy   Hyperlipidemia LDL goal <100    Osteoarthritis of right knee    Personal history of chemotherapy 1992   PONV (postoperative nausea and vomiting)    and "gets weak and light-leaded"   Sensation of pressure in bladder area  Vaginal vault prolapse    anterior   Wears glasses    Wears hearing aid    left only    PAST SURGICAL HISTORY: Past Surgical History:  Procedure Laterality Date   BREAST BIOPSY Right 04/19/2018   BREAST BIOPSY Right 05/15/2023   MM RT BREAST BX W LOC DEV 1ST LESION IMAGE BX SPEC STEREO GUIDE 05/15/2023 [facility]   BREAST LUMPECTOMY Right 05/17/2018   BREAST LUMPECTOMY WITH RADIOACTIVE SEED LOCALIZATION Right 05/17/2018   Procedure: RIGHT  BREAST LUMPECTOMY WITH RADIOACTIVE SEED LOCALIZATION;  Surgeon: Almond Lint, MD;  Location: [facility] OR;  Service: General;  Laterality: Right;   CATARACT EXTRACTION W/ INTRAOCULAR LENS  IMPLANT, BILATERAL  2014   CHOLECYSTECTOMY  1985   COLONOSCOPY  last one 06-04-2013   CYSTOCELE REPAIR N/A 02/29/2016   Procedure: ANTERIOR VAULT PROLAPSE REPAIR COLOPLAST SACROSPINUS FIXATION AUGMENTED AXIS DERMIS REPAIR  ;  Surgeon: Jethro Bolus, MD;  Location: [facility] ;  Service: Urology;  Laterality: N/A;   ESOPHAGOGASTRODUODENOSCOPY  08-31-2009   LAPAROSCOPIC ASSISTED VAGINAL HYSTERECTOMY  01-10-2000   w/ Bilateral Salpingoophorectomy /  Anterior & Posterior repair/  Pubovaginal Sling   MASTECTOMY Left 1992   TOTAL KNEE ARTHROPLASTY Right 05/21/2021   Procedure: RIGHT TOTAL KNEE ARTHROPLASTY;  Surgeon: Eldred Manges, MD;  Location: [facility] OR;  Service: Orthopedics;  Laterality: Right;    FAMILY HISTORY:  Family History  Problem Relation Age of Onset   Heart attack Mother    Colon cancer Mother 22   Hypertension Mother    Heart disease Father    Breast cancer Sister        pat half sister dx in her 78s   Pancreatic cancer Other 51    SOCIAL HISTORY:  Social History   Tobacco Use   Smoking status: Former    Current packs/day: 0.00    Types: Cigarettes    Start date: 10/12/1970    Quit date: 10/12/1983    Years since quitting: 39.6   Smokeless tobacco: Never  Vaping Use   Vaping status: Never Used  Substance Use Topics   Alcohol use: No   Drug use: No    ALLERGIES:  Allergies  Allergen Reactions   Codeine Nausea And Vomiting   Morphine And Codeine Other (See Comments)    Nausea, and drop in blood pressure    MEDICATIONS:  Current Outpatient Medications  Medication Sig Dispense Refill   acetaminophen (TYLENOL) 500 MG tablet Take 1 tablet (500 mg total) by mouth 2 (two) times daily as needed. 30 tablet 0   azelastine (OPTIVAR) 0.05 % ophthalmic solution 1 drop 2  (two) times daily.     Calcium Carbonate-Vit D-Min (CALCIUM 600+D3 PLUS MINERALS) 600-800 MG-UNIT TABS Take 2 tablets by mouth daily. 180 tablet 3   denosumab (PROLIA) 60 MG/ML SOSY injection Inject 60 mg into the skin every 6 (six) months.     fexofenadine (ALLEGRA) 180 MG tablet Take 180 mg by mouth daily.     ipratropium (ATROVENT) 0.03 % nasal spray Place into both nostrils.     metoprolol tartrate (LOPRESSOR) 25 MG tablet TAKE 1 TABLET(25 MG) BY MOUTH TWICE DAILY 180 tablet 2   Multiple Vitamin (MULTIVITAMIN) tablet Take 1 tablet by mouth daily.     Multiple Vitamins-Minerals (PRESERVISION AREDS 2 PO) Take 1 tablet by mouth daily.     Omega-3 Fatty Acids (FISH OIL) 1000 MG CAPS Take 1 capsule (1,000 mg total) by mouth daily.  0   omeprazole (PRILOSEC)  40 MG capsule TAKE 1 CAPSULE(40 MG) BY MOUTH DAILY 90 capsule 3   Probiotic Product (PROBIOTIC FORMULA PO) Take 1 tablet by mouth every evening.      rosuvastatin (CRESTOR) 5 MG tablet Take 1 tablet (5 mg total) by mouth 4 (four) times a week. Please keep scheduled appointment with Dr. Bjorn Pippin for future refills. Thank you. 48 tablet 0   No current facility-administered medications for this encounter.    REVIEW OF SYSTEMS:  A 10+ POINT REVIEW OF SYSTEMS WAS OBTAINED including neurology, dermatology, psychiatry, cardiac, respiratory, lymph, extremities, GI, GU, musculoskeletal, constitutional, reproductive, HEENT. ***   PHYSICAL EXAM:  vitals were not taken for this visit.   General: Alert and oriented, in no acute distress HEENT: Head is normocephalic. Extraocular movements are intact. Oropharynx is clear. Neck: Neck is supple, no palpable cervical or supraclavicular lymphadenopathy. Heart: Regular in rate and rhythm with no murmurs, rubs, or gallops. Chest: Clear to auscultation bilaterally, with no rhonchi, wheezes, or rales. Abdomen: Soft, nontender, nondistended, with no rigidity or guarding. Extremities: No cyanosis or  edema. Lymphatics: see Neck Exam Skin: No concerning lesions. Musculoskeletal: symmetric strength and muscle tone throughout. Neurologic: Cranial nerves II through XII are grossly intact. No obvious focalities. Speech is fluent. Coordination is intact. Psychiatric: Judgment and insight are intact. Affect is appropriate.  Right Breast: *** no palpable mass, nipple discharge or bleeding. Left Breast: *** no palpable mass, nipple discharge or bleeding.    ECOG = ***  0 - Asymptomatic (Fully active, able to carry on all predisease activities without restriction)  1 - Symptomatic but completely ambulatory (Restricted in physically strenuous activity but ambulatory and able to carry out work of a light or sedentary nature. For example, light housework, office work)  2 - Symptomatic, <50% in bed during the day (Ambulatory and capable of all self care but unable to carry out any work activities. Up and about more than 50% of waking hours)  3 - Symptomatic, >50% in bed, but not bedbound (Capable of only limited self-care, confined to bed or chair 50% or more of waking hours)  4 - Bedbound (Completely disabled. Cannot carry on any self-care. Totally confined to bed or chair)  5 - Death   Santiago Glad MM, Creech RH, Tormey DC, et al. [phone]). "Toxicity and response criteria of the [facility]". Am. Evlyn Clines. Oncol. 5 (6): 649-55  LABORATORY DATA:  Lab Results  Component Value Date   WBC 9.5 09/21/2022   HGB 13.7 09/21/2022   HCT 40.0 09/21/2022   MCV 90.0 09/21/2022   PLT 228.0 09/21/2022   NEUTROABS 4,449 05/08/2020   Lab Results  Component Value Date   NA 139 09/21/2022   K 4.3 09/21/2022   CL 105 09/21/2022   CO2 29 09/21/2022   GLUCOSE 79 09/21/2022   BUN 23 09/21/2022   CREATININE 0.86 09/21/2022   CALCIUM 10.0 09/21/2022      RADIOGRAPHY: MM RT BREAST BX W LOC DEV 1ST LESION IMAGE BX SPEC STEREO GUIDE  Addendum Date: 05/18/2023   ADDENDUM REPORT:  05/18/2023 10:32 ADDENDUM: Pathology revealed DUCTAL CARCINOMA IN SITU, INTERMEDIATE GRADE, NECROSIS: NOT IDENTIFIED, CALCIFICATIONS: PRESENT of the RIGHT breast, upper inner, (x clip). This was found to be concordant by Dr. Frederico Hamman. Pathology results were discussed with the patient by telephone. The patient reported doing well after the biopsy with tenderness at the site. Post biopsy instructions and care were reviewed and questions were answered. The patient was encouraged to call  [facility] for any additional concerns. My direct phone number was provided. Surgical consultation has been arranged with Dr. Almond Lint at [facility] on May 23, 2023. Medical oncology consultation has been requested with Dr. Serena Croissant at [facility], per patient request. Pathology results reported by Rene Kocher, RN on 05/17/2023. Electronically Signed   By: Frederico Hamman M.D.   On: 05/18/2023 10:32   Result Date: 05/18/2023 CLINICAL DATA:  89 year old female presenting for stereotactic biopsy of right breast calcifications. EXAM: RIGHT BREAST STEREOTACTIC CORE NEEDLE BIOPSY COMPARISON:  Previous exam(s). FINDINGS: The patient and I discussed the procedure of stereotactic-guided biopsy including benefits and alternatives. We discussed the high likelihood of a successful procedure. We discussed the risks of the procedure including infection, bleeding, tissue injury, clip migration, and inadequate sampling. Informed written consent was given. The usual time out protocol was performed immediately prior to the procedure. Using sterile technique and 1% Lidocaine as local anesthetic, under stereotactic guidance, a 9 gauge vacuum assisted device was used to perform core needle biopsy of calcifications in the upper inner right breast using a medial approach. Specimen radiograph was performed showing calcifications in multiple core samples. Specimens with  calcifications are identified for pathology. Lesion quadrant: Upper inner quadrant At the conclusion of the procedure, an X shaped tissue marker clip was deployed into the biopsy cavity. Follow-up 2-view mammogram was performed and dictated separately. IMPRESSION: Stereotactic-guided biopsy of calcifications in the upper inner right breast (X clip). No apparent complications. Electronically Signed: By: Frederico Hamman M.D. On: 05/15/2023 10:00   MM CLIP PLACEMENT RIGHT  Result Date: 05/15/2023 CLINICAL DATA:  Post biopsy mammogram of the right breast for clip placement. EXAM: 3D DIAGNOSTIC RIGHT MAMMOGRAM POST STEREOTACTIC BIOPSY COMPARISON:  Previous exam(s). FINDINGS: 3D Mammographic images were obtained following stereotactic guided biopsy of calcifications in the upper inner right breast. The biopsy marking clip is about 1.5 cm laterally displaced from the site of biopsy. IMPRESSION: The X shaped biopsy marking clip is about 1.5 cm laterally displaced from the site of biopsy. Final Assessment: Post Procedure Mammograms for Marker Placement BI-RADS CATEGORY  29M: Post-Procedure Mammogram for Marker Placement Electronically Signed   By: Frederico Hamman M.D.   On: 05/15/2023 10:03      IMPRESSION: Stage 0 (cTis (DCIS), cN0, cM0) Right Breast UIQ, Intermediate grade DCIS, ER+ / PR+ / Her2 not assessed   History of left breast cancer in 1992 s/p mastectomy and chemotherapy, and right breast cancer diagnosed in 2019 s/p lumpectomy and antiestrogen therapy (no prior history of radiation to either breast)  ***  Today, I talked to the patient and family about the findings and work-up thus far.  We discussed the natural history of *** and general treatment, highlighting the role of radiotherapy in the management.  We discussed the available radiation techniques, and focused on the details of logistics and delivery.  We reviewed the anticipated acute and late sequelae associated with radiation in this  setting.  The patient was encouraged to ask questions that I answered to the best of my ability. *** A patient consent form was discussed and signed.  We retained a copy for our records.  The patient would like to proceed with radiation and will be scheduled for CT simulation.  PLAN: ***    *** minutes of total time was spent for this patient encounter, including preparation, face-to-face counseling with the patient and coordination of care, physical exam, and documentation of  the encounter.   ------------------------------------------------  Billie Lade, PhD, MD  This document serves as a record of services personally performed by Antony Blackbird, MD. It was created on his behalf by Neena Rhymes, a trained medical scribe. The creation of this record is based on the scribe's personal observations and the provider's statements to them. This document has been checked and approved by the attending provider.

## 2023-05-31 ENCOUNTER — Other Ambulatory Visit: Payer: Self-pay

## 2023-05-31 ENCOUNTER — Ambulatory Visit
Admission: RE | Admit: 2023-05-31 | Discharge: 2023-05-31 | Disposition: A | Discharge status: Home / Self Care | Payer: Medicare PPO | Source: Ambulatory Visit | Attending: Radiation Oncology | Admitting: Radiation Oncology

## 2023-05-31 ENCOUNTER — Encounter: Payer: Self-pay | Admitting: Radiation Oncology

## 2023-05-31 VITALS — BP 144/75 | HR 71 | Temp 97.3°F | Resp 18 | Ht 65.0 in | Wt 169.2 lb

## 2023-05-31 DIAGNOSIS — M1990 Unspecified osteoarthritis, unspecified site: Secondary | ICD-10-CM | POA: Diagnosis not present

## 2023-05-31 DIAGNOSIS — Z87891 Personal history of nicotine dependence: Secondary | ICD-10-CM | POA: Insufficient documentation

## 2023-05-31 DIAGNOSIS — N8110 Cystocele, unspecified: Secondary | ICD-10-CM | POA: Diagnosis not present

## 2023-05-31 DIAGNOSIS — K219 Gastro-esophageal reflux disease without esophagitis: Secondary | ICD-10-CM | POA: Diagnosis not present

## 2023-05-31 DIAGNOSIS — Z9012 Acquired absence of left breast and nipple: Secondary | ICD-10-CM | POA: Insufficient documentation

## 2023-05-31 DIAGNOSIS — Z79899 Other long term (current) drug therapy: Secondary | ICD-10-CM | POA: Insufficient documentation

## 2023-05-31 DIAGNOSIS — Z853 Personal history of malignant neoplasm of breast: Secondary | ICD-10-CM

## 2023-05-31 DIAGNOSIS — Z860100 Personal history of colon polyps, unspecified: Secondary | ICD-10-CM | POA: Insufficient documentation

## 2023-05-31 DIAGNOSIS — Z800 Family history of malignant neoplasm of digestive organs: Secondary | ICD-10-CM | POA: Diagnosis not present

## 2023-05-31 DIAGNOSIS — Z170 Estrogen receptor positive status [ER+]: Secondary | ICD-10-CM

## 2023-05-31 DIAGNOSIS — Z803 Family history of malignant neoplasm of breast: Secondary | ICD-10-CM | POA: Diagnosis not present

## 2023-05-31 DIAGNOSIS — Z79810 Long term (current) use of selective estrogen receptor modulators (SERMs): Secondary | ICD-10-CM | POA: Insufficient documentation

## 2023-05-31 DIAGNOSIS — E785 Hyperlipidemia, unspecified: Secondary | ICD-10-CM | POA: Insufficient documentation

## 2023-05-31 DIAGNOSIS — D0511 Intraductal carcinoma in situ of right breast: Secondary | ICD-10-CM | POA: Insufficient documentation

## 2023-05-31 DIAGNOSIS — Z923 Personal history of irradiation: Secondary | ICD-10-CM | POA: Diagnosis not present

## 2023-05-31 DIAGNOSIS — C50211 Malignant neoplasm of upper-inner quadrant of right female breast: Secondary | ICD-10-CM | POA: Diagnosis not present

## 2023-05-31 DIAGNOSIS — Z9221 Personal history of antineoplastic chemotherapy: Secondary | ICD-10-CM | POA: Insufficient documentation

## 2023-05-31 NOTE — Telephone Encounter (Signed)
Spoke with patient and she is requesting an injection. Asked protocol questions and she stated she is getting ready to have breast surgery. Informed patient to ask surgeon if it is okay to have injection because some surgeons will not allow the injections for 3 months before or after the surgery. She stated she will hold off on the injection until everything else is over.

## 2023-05-31 NOTE — Addendum Note (Signed)
Encounter addended by: Rana Snare, LPN on: 16/05/9603 9:10 AM  Actions taken: Visit diagnoses modified

## 2023-06-01 ENCOUNTER — Inpatient Hospital Stay: Payer: Medicare PPO | Attending: Hematology and Oncology | Admitting: Hematology and Oncology

## 2023-06-01 VITALS — BP 133/63 | HR 79 | Temp 97.3°F | Resp 18 | Wt 170.1 lb

## 2023-06-01 DIAGNOSIS — Z9012 Acquired absence of left breast and nipple: Secondary | ICD-10-CM | POA: Diagnosis not present

## 2023-06-01 DIAGNOSIS — Z170 Estrogen receptor positive status [ER+]: Secondary | ICD-10-CM | POA: Insufficient documentation

## 2023-06-01 DIAGNOSIS — Z853 Personal history of malignant neoplasm of breast: Secondary | ICD-10-CM | POA: Insufficient documentation

## 2023-06-01 DIAGNOSIS — D0511 Intraductal carcinoma in situ of right breast: Secondary | ICD-10-CM

## 2023-06-01 DIAGNOSIS — Z9223 Personal history of estrogen therapy: Secondary | ICD-10-CM | POA: Insufficient documentation

## 2023-06-01 DIAGNOSIS — M8580 Other specified disorders of bone density and structure, unspecified site: Secondary | ICD-10-CM | POA: Diagnosis not present

## 2023-06-01 NOTE — Progress Notes (Signed)
Patient Care Team: Myrlene Broker, MD as PCP - General (Internal Medicine) Lars Masson, MD as PCP - Cardiology (Cardiology) Almond Lint, MD as Consulting Physician (General Surgery) Serena Croissant, MD as Consulting Physician (Hematology and Oncology) Antony Blackbird, MD as Consulting Physician (Radiation Oncology) Axel Filler Larna Daughters, NP as Nurse Practitioner (Hematology and Oncology) [facility] as Consulting Physician (Ophthalmology)  DIAGNOSIS:  No diagnosis found.   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, ER 100%, PR 100%, HER-2 -1+ by IHC, T1 a N0 stage I a clinical stage   05/17/2018 Surgery   Right lumpectomy: IDC, 5.5 mm, grade 1, margins negative, ER 100%, PR 100%, HER-2 1+ by IHC, Ki-67 less than 1%, T1BNX stage Ia   05/28/2018 -  Anti-estrogen oral therapy   Letrozole daily discontinued 03/04/19, switched to anastrozole 03/23/2019, switched to tamoxifen 06/04/19   05/30/2018 Cancer Staging   Staging form: Breast, AJCC 8th Edition - Pathologic: Stage IA (pT1b, pN0, cM0, G1, ER+, PR+, HER2-) - Signed by Loa Socks, NP on 05/30/2018   06/05/2018 Genetic Testing   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  The Multi-Gene Panel offered by [facility] includes sequencing and/or deletion duplication testing of the following 84 genes: AIP, ALK, APC, ATM, AXIN2,BAP1,  BARD1, BLM, BMPR1A, BRCA1, BRCA2, BRIP1, CASR, CDC73, CDH1, CDK4, CDKN1B, CDKN1C, CDKN2A (p14ARF), CDKN2A (p16INK4a), CEBPA, CHEK2, CTNNA1, DICER1, DIS3L2, EGFR (c.2369C>T, p.Thr790Met  variant only), EPCAM (Deletion/duplication testing only), FH, FLCN, GATA2, GPC3, GREM1 (Promoter region deletion/duplication testing only), HOXB13 (c.251G>A, p.Gly84Glu), HRAS, KIT, MAX, MEN1, MET, MITF (c.952G>A, p.Glu318Lys variant only), MLH1, MSH2, MSH3, MSH6, MUTYH, NBN, NF1, NF2, NTHL1, PALB2, PDGFRA, PHOX2B, PMS2, POLD1, POLE, POT1, PRKAR1A, PTCH1, PTEN, RAD50, RAD51C, RAD51D, RB1, RECQL4, RET, RUNX1, SDHAF2, SDHA (sequence changes only), SDHB, SDHC, SDHD, SMAD4, SMARCA4, SMARCB1, SMARCE1, STK11, SUFU, TERC, TERT, TMEM127, TP53, TSC1, TSC2, VHL, WRN and WT1.  The report date is 06/05/2018.     CHIEF COMPLIANT:  Right breast DCIS  INTERVAL HISTORY:   Rebecca Guzman is here for a follow-up given her recent diagnosis of right breast DCIS.  She is a patient of Dr. Pamelia Hoit.  She tells me that she was hoping to meet the medical oncologist sooner than scheduled because she was unable to choose lumpectomy versus mastectomy.  She however tells me that she is very sure that she does not want to take the antiestrogen therapy, she felt extremely fatigued while she was on it and she had tried 3 different ones before.  She tells me both her daughters were leaning towards mastectomy. Rest of the pertinent 10 point ROS reviewed and negative  ALLERGIES:  is allergic to codeine and morphine and codeine.  MEDICATIONS:  Current Outpatient Medications  Medication Sig Dispense Refill   acetaminophen (TYLENOL) 500 MG tablet Take 1 tablet (500 mg total) by mouth 2 (two) times daily as needed. 30 tablet 0   azelastine (OPTIVAR) 0.05 % ophthalmic solution 1 drop 2 (two) times daily.     Calcium Carbonate-Vit D-Min (CALCIUM 600+D3 PLUS MINERALS) 600-800 MG-UNIT TABS Take 2 tablets by mouth daily. 180 tablet 3   denosumab (PROLIA) 60  MG/ML SOSY injection Inject 60 mg into the skin every 6 (six) months.     fexofenadine (ALLEGRA) 180 MG tablet Take 180 mg by mouth daily.     ipratropium (ATROVENT) 0.03 % nasal spray Place  into both nostrils.     metoprolol tartrate (LOPRESSOR) 25 MG tablet TAKE 1 TABLET(25 MG) BY MOUTH TWICE DAILY 180 tablet 2   Multiple Vitamin (MULTIVITAMIN) tablet Take 1 tablet by mouth daily.     Multiple Vitamins-Minerals (PRESERVISION AREDS 2 PO) Take 1 tablet by mouth daily.     Omega-3 Fatty Acids (FISH OIL) 1000 MG CAPS Take 1 capsule (1,000 mg total) by mouth daily.  0   omeprazole (PRILOSEC) 40 MG capsule TAKE 1 CAPSULE(40 MG) BY MOUTH DAILY 90 capsule 3   Probiotic Product (PROBIOTIC FORMULA PO) Take 1 tablet by mouth every evening.      rosuvastatin (CRESTOR) 5 MG tablet Take 1 tablet (5 mg total) by mouth 4 (four) times a week. Please keep scheduled appointment with Dr. Bjorn Pippin for future refills. Thank you. 48 tablet 0   No current facility-administered medications for this visit.    PHYSICAL EXAMINATION: ECOG PERFORMANCE STATUS: 1 - Symptomatic but completely ambulatory  Vitals:   06/01/23 1546  BP: 133/63  Pulse: 79  Resp: 18  Temp: (!) 97.3 F (36.3 C)  SpO2: 99%    Filed Weights   06/01/23 1546  Weight: 170 lb 1.6 oz (77.2 kg)    Physical exam deferred in lieu of counseling  LABORATORY DATA:  I have reviewed the data as listed    Latest Ref Rng & Units 09/21/2022   10:35 AM 05/18/2021    9:40 AM 03/29/2021    2:41 PM  CMP  Glucose 70 - 99 mg/dL 79  88  86   BUN 6 - 23 mg/dL 23  14  15    Creatinine 0.40 - 1.20 mg/dL 4.09  8.11  9.14   Sodium 135 - 145 mEq/L 139  139  137   Potassium 3.5 - 5.1 mEq/L 4.3  3.9  4.4   Chloride 96 - 112 mEq/L 105  106  103   CO2 19 - 32 mEq/L 29  28  27    Calcium 8.4 - 10.5 mg/dL 78.2  9.1  9.8   Total Protein 6.0 - 8.3 g/dL 7.3  6.6  7.1   Total Bilirubin 0.2 - 1.2 mg/dL 0.5  0.7  0.4   Alkaline Phos 39 - 117 U/L 50  57  57   AST 0 - 37 U/L 19  21  17    ALT 0 - 35 U/L 17  18  15      Lab Results  Component Value Date   WBC 9.5 09/21/2022   HGB 13.7 09/21/2022   HCT 40.0 09/21/2022   MCV 90.0 09/21/2022   PLT  228.0 09/21/2022   NEUTROABS 4,449 05/08/2020    ASSESSMENT & PLAN:   History of breast cancer 05/17/2018: Right lumpectomy: IDC, 5.5 mm, grade 1, margins negative, ER 100%, PR 100%, HER-2 1+ by IHC, Ki-67 less than 1%, T1BNX stage Ia History of left mastectomy 1992 We did not recommend adjuvant radiation therapy because of her favorable profile and her age.   Current treatment: Letrozole 2.5 mg daily started 05/28/2018, switched to anastrozole 03/23/2019 (due to arthralgias and fatigue), switched to tamoxifen 06/04/2019 discontinued 09/15/2020 (tamoxifen related fatigue)    Breast cancer surveillance:  Mammogram right breast: 04/22/2021: Benign breast density category C Breast exam 10/19/2022:  Benign  Bone density in Nov 2021: T score -1.9 Osteopenia: rec Cal + Vit D and walking for exercises  Since her last visit with medical oncology, she had a diagnostic mammogram recently, this showed indeterminate calcifications spanning 6 mm at the anterior inferior aspect of the lumpectomy site.  Biopsy showed intermediate grade DCIS, no necrosis, ER/PR positive.  She was seen back by Dr. Donell Beers, has also followed up with Dr. Roselind Messier to discuss the role of surgery, radiation.  She is here to review treatment recommendations with medical oncology.  She tells me that she is strongly opposed to trying antiestrogen therapy again.  Given her history of left mastectomy, previous poor intolerance to antiestrogen therapy, I believe it is reasonable to consider mastectomy.  She is leaning towards mastectomy.  She is hoping not to do adjuvant radiation or adjuvant antiestrogen therapy.  She understands that if she were to indeed have mastectomy and there is no evidence of invasive breast cancer, then there is no role for adjuvant radiation or antiestrogen therapy.  After much discussion, she agrees that she would like to proceed with mastectomy and she requested I emailed the team.  She can follow-up with Dr. Pamelia Hoit in  about 6 months.  No orders of the defined types were placed in this encounter.  The patient has a good understanding of the overall plan. she agrees with it. she will call with any problems that may develop before the next visit here. Total time spent: 30 mins including face to face time and time spent for planning, charting and co-ordination of care   Rachel Moulds, MD 06/01/23

## 2023-06-02 ENCOUNTER — Telehealth: Payer: Self-pay | Admitting: Hematology and Oncology

## 2023-06-02 ENCOUNTER — Encounter: Payer: Self-pay | Admitting: *Deleted

## 2023-06-02 ENCOUNTER — Other Ambulatory Visit: Payer: Self-pay | Admitting: General Surgery

## 2023-06-02 NOTE — Telephone Encounter (Signed)
Spoke with patient confirming upcoming appointment  

## 2023-06-05 NOTE — Therapy (Signed)
OUTPATIENT PHYSICAL THERAPY BREAST CANCER BASELINE EVALUATION   Patient Name: Rebecca Guzman MRN: [id number] DOB:[birth year], 89 y.o., female Today's Date: 06/06/2023  END OF SESSION:  PT End of Session - 06/06/23 1015     Visit Number 1    Number of Visits 2    Date for PT Re-Evaluation 08/15/23    Authorization Type Auth needed    PT Start Time 1000    PT Stop Time 1016    PT Time Calculation (min) 16 min    Activity Tolerance Patient tolerated treatment well    Behavior During Therapy WFL for tasks assessed/performed             Past Medical History:  Diagnosis Date   Asthma    BPPV (benign paroxysmal positional vertigo)    Breast cancer (HCC)    left mastectomy 1992, right lumpectomy 2019   Cancer (HCC)    Compression fracture of L3 lumbar vertebra 04/06/2015   Dysrhythmia 2019   pt states Cardiologist told her it is "pre A-fib"   Family history of breast cancer    Family history of colon cancer    Family history of pancreatic cancer    GERD (gastroesophageal reflux disease)    Hiatal hernia    pt states she doesnt have this   History of adenomatous polyp of colon    tubular adenoma 2005 and  2014 tubular adenoma and  hyperplastic polyp   History of breast cancer per pt no recurrence   dx 1992 --  s/p  left breast mastectomy and chemotherapy   Hyperlipidemia LDL goal <100    Osteoarthritis of right knee    Personal history of chemotherapy 1992   PONV (postoperative nausea and vomiting)    and "gets weak and light-leaded"   Sensation of pressure in bladder area    Vaginal vault prolapse    anterior   Wears glasses    Wears hearing aid    left only   Past Surgical History:  Procedure Laterality Date   BREAST BIOPSY Right 04/19/2018   BREAST BIOPSY Right 05/15/2023   MM RT BREAST BX W LOC DEV 1ST LESION IMAGE BX SPEC STEREO GUIDE 05/15/2023 [facility]   BREAST LUMPECTOMY Right 05/17/2018   BREAST LUMPECTOMY WITH RADIOACTIVE SEED LOCALIZATION  Right 05/17/2018   Procedure: RIGHT BREAST LUMPECTOMY WITH RADIOACTIVE SEED LOCALIZATION;  Surgeon: Almond Lint, MD;  Location: [facility];  Service: General;  Laterality: Right;   CATARACT EXTRACTION W/ INTRAOCULAR LENS  IMPLANT, BILATERAL  2014   CHOLECYSTECTOMY  1985   COLONOSCOPY  last one 06-04-2013   CYSTOCELE REPAIR N/A 02/29/2016   Procedure: ANTERIOR VAULT PROLAPSE REPAIR COLOPLAST SACROSPINUS FIXATION AUGMENTED AXIS DERMIS REPAIR  ;  Surgeon: Jethro Bolus, MD;  Location: [facility];  Service: Urology;  Laterality: N/A;   ESOPHAGOGASTRODUODENOSCOPY  08-31-2009   LAPAROSCOPIC ASSISTED VAGINAL HYSTERECTOMY  01-10-2000   w/ Bilateral Salpingoophorectomy /  Anterior & Posterior repair/  Pubovaginal Sling   MASTECTOMY Left 1992   TOTAL KNEE ARTHROPLASTY Right 05/21/2021   Procedure: RIGHT TOTAL KNEE ARTHROPLASTY;  Surgeon: Eldred Manges, MD;  Location: [facility] OR;  Service: Orthopedics;  Laterality: Right;   Patient Active Problem List   Diagnosis Date Noted   Anemia 09/21/2021   Routine general medical examination at a health care facility 09/21/2021   Hx of total knee arthroplasty, right 05/21/2021   Genetic testing 06/07/2018   History of breast cancer 04/19/2018   Wears hearing aid  Wears glasses    Vaginal vault prolapse    Hiatal hernia    BPPV (benign paroxysmal positional vertigo)    Palpitations 10/31/2016   Radicular pain of right lower back 04/06/2015   Compression fracture of L3 vertebra (HCC) 04/06/2015   Senile osteoporosis 04/06/2015   DDD (degenerative disc disease), lumbar 04/06/2015   Fatigue 01/17/2014   Hx of adenomatous colonic polyps 07/11/2013   Hyperlipidemia LDL goal <100    Bladder prolapse, female, acquired    Esophageal reflux 10/12/2011    PCP: Dr. Hillard Danker  REFERRING PROVIDER: Dr. Almond Lint   REFERRING DIAG:  Diagnosis  C50.211 (ICD-10-CM) - Malignant neoplasm of upper-inner quadrant of right female breast     THERAPY DIAG:  History of breast cancer  Intraductal carcinoma in situ of right breast  Abnormal posture  Rationale for Evaluation and Treatment: Rehabilitation  ONSET DATE: 05/28/23  SUBJECTIVE:                                                                                                                                                                                           SUBJECTIVE STATEMENT: Patient reports she is here today to be seen by her medical team for her newly diagnosed right breast cancer. I think I have arthritis in the joint which is why I have the shoulder pain.    PERTINENT HISTORY:  Hx of Lt breast cancer with mastectomy in 1992. Rt lumpectomy 05/17/2018. New diagnosis of Rt DCIS.  Pt will be opting for a mastectomy - no SLNB . Other hx: LBP, scoliosis, herniated disc.    PATIENT GOALS:   reduce lymphedema risk and learn post op HEP.   PAIN:  Are you having pain? Yes: NPRS scale: 2/10 Pain location: Rt shoulder Pain description: aching all the time Aggravating factors: sleeping wrong, heavy housework, when I had the biopsy it increased the pain.   Relieving factors: medication if needed   PRECAUTIONS: Active CA , cancer hx bil  RED FLAGS: None   HAND DOMINANCE: right  WEIGHT BEARING RESTRICTIONS: No  FALLS:  Has patient fallen in last 6 months? No  LIVING ENVIRONMENT: Patient lives with: my daughter and son  OCCUPATION: retired   LEISURE: pot gardening, water walking   PRIOR LEVEL OF FUNCTION: Independent   OBJECTIVE: Note: Objective measures were completed at Evaluation unless otherwise noted.  COGNITION: Overall cognitive status: Within functional limits for tasks assessed    POSTURE:  Forward head and rounded shoulders posture  UPPER EXTREMITY AROM/PROM:  A/PROM RIGHT   eval -all have unchanging pain  Shoulder extension 50  Shoulder flexion 128   Shoulder  abduction 164   Shoulder internal rotation   Shoulder external  rotation 80    (Blank rows = not tested)  A/PROM LEFT   eval  Shoulder extension 58  Shoulder flexion 145  Shoulder abduction 160  Shoulder internal rotation   Shoulder external rotation 70    (Blank rows = not tested)  CERVICAL AROM: All within normal limits:    Percent limited  Flexion   Extension 50%  Right lateral flexion   Left lateral flexion   Right rotation   Left rotation     QUICK DASH SURVEY: 18% limited due to OA   PATIENT EDUCATION:  Education details: post op shoulder/posture HEP, bras Person educated: Patient Education method: Programmer, multimedia, Demonstration, Handout Education comprehension: Patient verbalized understanding and returned demonstration  HOME EXERCISE PROGRAM: Patient was instructed today in a home exercise program today for post op shoulder range of motion. These included active assist shoulder flexion in sitting, scapular retraction, wall walking with shoulder abduction, and hands behind head external rotation.  She was encouraged to do these twice a day, holding 3 seconds and repeating 5 times when permitted by her physician.   ASSESSMENT:  CLINICAL IMPRESSION: Pt will benefit from a post op PT reassessment to determine needs. She has baseline limited ROM and 2/10 pain due to OA which was aggravated from biopsy.    Pt will benefit from skilled therapeutic intervention to improve on the following deficits: Decreased knowledge of precautions, impaired UE functional use, pain, decreased ROM, postural dysfunction.   PT treatment/interventions: ADL/self-care home management, pt/family education, therapeutic exercise  REHAB POTENTIAL: Excellent  CLINICAL DECISION MAKING: Stable/uncomplicated  EVALUATION COMPLEXITY: Low   GOALS: Goals reviewed with patient? YES  LONG TERM GOALS: (STG=LTG)    Name Target Date Goal status       1 Pt will be able to return demo and/or verbalize understanding of the post op HEP related to regaining shoulder  ROM. Baseline:  No knowledge 06/06/2023 Achieved at eval       2 Pt will demo she has regained full shoulder ROM and function post operatively compared to baselines.  Baseline: See objective measurements taken today. 08/15/23     PLAN:  PT FREQUENCY/DURATION: EVAL and 1 follow up appointment.   PLAN FOR NEXT SESSION: will reassess 3-4 weeks post op to determine needs.   Patient will follow up at outpatient cancer rehab 3-4 weeks following surgery.  If the patient requires physical therapy at that time, a specific plan will be dictated and sent to the referring physician for approval.  Physical Therapy Information for After Breast Cancer Surgery/Treatment:  You may begin the 4 shoulder/posture exercises (see additional sheet) when permitted by your physician (typically a week after surgery).  If you have drains, you may need to wait until those are removed before beginning range of motion exercises.  A general recommendation is to not lift your arms above shoulder height until drains are removed.  These exercises should be done to your tolerance and gently.  This is not a "no pain/no gain" type of recovery so listen to your body and stretch into the range of motion that you can tolerate, stopping if you have pain.  If you are having immediate reconstruction, ask your plastic surgeon about doing exercises as he or she may want you to wait.  [facility] [phone]. [address], [place] [address]     Idamae Lusher, PT 06/06/2023, 10:16 AM

## 2023-06-06 ENCOUNTER — Encounter: Payer: Self-pay | Admitting: Rehabilitation

## 2023-06-06 ENCOUNTER — Ambulatory Visit: Payer: Medicare PPO | Attending: General Surgery | Admitting: Rehabilitation

## 2023-06-06 ENCOUNTER — Other Ambulatory Visit: Payer: Self-pay

## 2023-06-06 DIAGNOSIS — R293 Abnormal posture: Secondary | ICD-10-CM | POA: Insufficient documentation

## 2023-06-06 DIAGNOSIS — Z853 Personal history of malignant neoplasm of breast: Secondary | ICD-10-CM | POA: Insufficient documentation

## 2023-06-06 DIAGNOSIS — D0511 Intraductal carcinoma in situ of right breast: Secondary | ICD-10-CM | POA: Insufficient documentation

## 2023-06-07 ENCOUNTER — Encounter: Payer: Self-pay | Admitting: *Deleted

## 2023-06-08 ENCOUNTER — Ambulatory Visit: Payer: Medicare PPO | Admitting: Adult Health

## 2023-06-08 ENCOUNTER — Other Ambulatory Visit: Payer: Self-pay

## 2023-06-08 ENCOUNTER — Encounter (HOSPITAL_BASED_OUTPATIENT_CLINIC_OR_DEPARTMENT_OTHER): Payer: Self-pay | Admitting: General Surgery

## 2023-06-08 NOTE — Progress Notes (Signed)
   06/08/23 1230  PAT Phone Screen  Is the patient taking a GLP-1 receptor agonist? No  Do You Have Diabetes? No  Do You Have Hypertension? No  Have You Ever Been to the ER for Asthma? No  Have You Taken Oral Steroids in the Past 3 Months? No  Do you Take Phenteramine or any Other Diet Drugs? No  Recent  Lab Work, EKG, CXR? Yes  Where was this test performed? (S)  EKG 02/13/23, Echo 2022 (EF 55-60%)  Do you have a history of heart problems? (S)  Yes (dysrhythmia 2019, card ckear 02/13/23)  Cardiologist Name (S)  P[place]  Have you ever had tests on your heart? Yes  What cardiac tests were performed? Echo;EKG  What date/year were cardiac tests completed? ECHO 2022, EKG 2024  Results viewable: [facility] Media Tab  Any Recent Hospitalizations? No  Height 5\' 5"  (1.651 m)  Weight 77.2 kg  Pat Appointment Scheduled Yes (ensure and [facility])

## 2023-06-12 DIAGNOSIS — C50912 Malignant neoplasm of unspecified site of left female breast: Secondary | ICD-10-CM | POA: Diagnosis not present

## 2023-06-12 DIAGNOSIS — C50911 Malignant neoplasm of unspecified site of right female breast: Secondary | ICD-10-CM | POA: Diagnosis not present

## 2023-06-12 NOTE — H&P (Signed)
REFERRING PHYSICIAN:  Frederico Hamman, MD   PROVIDER:  Matthias Hughs, MD   Care Team: Patient Care Team: Kipp Brood, MD as PCP - General (Internal Medicine) Sabas Sous, MD (Hematology and Oncology) Matthias Hughs, MD as Consulting Provider (Surgical Oncology)    MRN: [id number] DOB: 20-Jun-1934 DATE OF ENCOUNTER: 05/23/2023   Subjective    Chief Complaint: New Consultation (RT BREAST)   History of Present Illness: Rebecca Guzman is a 89 y.o. female who is seen today as an office consultation at the request of Dr. Pamelia Hoit for evaluation of New Consultation (RT BREAST) .   Patient has a second diagnosis of right breast cancer October 2024.  The patient had prior right breast cancer in 2019 and previous left breast cancer in 1992.   She had her 5-year diagnostic mammogram and this demonstrated a group of pleomorphic calcifications that were 6 mm at the inferior aspect of the lumpectomy site.  These were deemed suspicious and a core needle biopsy was performed.  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  This was strongly ER and PR positive about 100% for both.     Pt has had prior cancer on the left in 1992 and had chemo and mastectomy (sounds like also ALND).  She had right breast cancer 2019 which I took care of with lumpectomy. Path stage was pT1bcNx.  Pt started letrozole in 2020. This was switched to anastrozole and then tamoxifen which she took for a total of 2.5 years.  The tamoxifen make her extremely fatigued.      Family cancer history - half sister with breast cancer, mother had colon cancer, sister had colon polyps.  There is family history of pancreatic cancer.    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     Patient is accompanied by her daughter.   Work -patient does a lot of her own housework.   Diagnostic mammogram: [facility] 04/27/23 ACR Breast Density Category b: There are scattered areas  of fibroglandular density.   FINDINGS: Spot 2D magnification views of the right breast were performed in addition to standard views. At the anterior inferior aspect of the lumpectomy site there is a group of pleomorphic calcifications spanning 0.6 cm. No additional new findings elsewhere in the right breast.   IMPRESSION: Indeterminate calcifications spanning 0.6 cm at the anterior inferior aspect of the lumpectomy site.   RECOMMENDATION: Stereotactic core needle biopsy x1 of the right breast.   I have discussed the findings and recommendations with the patient. If applicable, a reminder letter will be sent to the patient regarding the next appointment.   BI-RADS CATEGORY  4: Suspicious.   Pathology core needle biopsy: 05/15/2023 1. Breast, right, needle core biopsy, upper inner, X clip :       - DUCTAL CARCINOMA IN SITU, INTERMEDIATE GRADE       - NECROSIS: NOT IDENTIFIED       - CALCIFICATIONS: PRESENT       - DCIS LENGTH: 0.3 CM    Receptors: IMMUNOHISTOCHEMICAL AND MORPHOMETRIC ANALYSIS PERFORMED MANUALLY  Estrogen Receptor:  100%, POSITIVE, STRONG STAINING INTENSITY  Progesterone Receptor:  100%, POSITIVE, STRONG STAINING      Review of Systems: A complete review of systems was obtained from the patient.  I have reviewed this information and discussed as appropriate with the patient.  See HPI as well for other ROS. ROS -positive for hearing loss, dry cough, some palpitations, neck arthritis, and mild dementia.  Medical History: Past Medical History      Past Medical History:  Diagnosis Date   Arthritis     Asthma, unspecified asthma severity, unspecified whether complicated, unspecified whether persistent (HHS-HCC)     GERD (gastroesophageal reflux disease)     Heart valve disease     History of cancer          Problem List     Patient Active Problem List  Diagnosis   Malignant neoplasm of upper-inner quadrant of right breast in female, estrogen  receptor positive (CMS/HHS-HCC)   History of right breast cancer   History of left breast cancer   Family history of cancer        Past Surgical History       Past Surgical History:  Procedure Laterality Date   CHOLECYSTECTOMY       HYSTERECTOMY       JOINT REPLACEMENT       MASTECTOMY            Allergies       Allergies  Allergen Reactions   Asprin Ec Low Dose [Aspirin] Other (See Comments)      ACID REFLUX, STOMACH PAIN   Codeine Nausea and Other (See Comments)      DROP IN BP   Morphine Nausea and Other (See Comments)      DROP IN BP        Medications Ordered Prior to Encounter        Current Outpatient Medications on File Prior to Visit  Medication Sig Dispense Refill   acetaminophen (TYLENOL) 500 MG tablet Take 500 mg by mouth 2 (two) times daily       azelastine (OPTIVAR) 0.05 % ophthalmic solution 1 drop 2 (two) times daily       denosumab (PROLIA) 60 mg/mL inj syringe Inject subcutaneously       fexofenadine (ALLEGRA) 180 MG tablet Take 180 mg by mouth once daily       ipratropium (ATROVENT) 21 mcg (0.03 %) nasal spray 1-2 sprays in each nostril Nasally Three a day or prn for 30 days       L.acid,casei,plant,saliv/B.ani (PROBIOTIC FORMULA ORAL) Take by mouth       metoprolol tartrate (LOPRESSOR) 25 MG tablet Take 25 mg by mouth 2 (two) times daily       multivitamin tablet Take 1 tablet by mouth once daily       omeprazole (PRILOSEC) 40 MG DR capsule Take 40 mg by mouth once daily       rosuvastatin (CRESTOR) 5 MG tablet Take by mouth       vit C/E/Zn/coppr/lutein/zeaxan (PRESERVISION AREDS-2 ORAL) Take by mouth        No current facility-administered medications on file prior to visit.        Family History       Family History  Problem Relation Age of Onset   Colon cancer Mother     Coronary Artery Disease (Blocked arteries around heart) Father     High blood pressure (Hypertension) Sister     Diabetes Sister     Breast cancer Sister     Skin  cancer Son     Colon cancer Nephew     Breast cancer Half-Sister          Tobacco Use History  Social History        Tobacco Use  Smoking Status Former   Types: Cigarettes  Smokeless Tobacco Never  Social History  Social History         Socioeconomic History   Marital status: Widowed  Tobacco Use   Smoking status: Former      Types: Cigarettes   Smokeless tobacco: Never  Vaping Use   Vaping status: Never Used  Substance and Sexual Activity   Alcohol use: Not Currently   Drug use: Never    Social Drivers of Health        Financial Resource Strain: Low Risk  (01/03/2023)    Received from [facility]    Overall Financial Resource Strain (CARDIA)     Difficulty of Paying Living Expenses: Not hard at all  Food Insecurity: No Food Insecurity (01/03/2023)    Received from [facility]    Hunger Vital Sign     Worried About Running Out of Food in the Last Year: Never true     Ran Out of Food in the Last Year: Never true  Transportation Needs: No Transportation Needs (01/03/2023)    Received from [facility] - Transportation     Lack of Transportation (Medical): No     Lack of Transportation (Non-Medical): No  Physical Activity: Sufficiently Active (01/03/2023)    Received from [facility]    Exercise Vital Sign     Days of Exercise per Week: 5 days     Minutes of Exercise per Session: 30 min  Stress: No Stress Concern Present (01/03/2023)    Received from [facility] - Occupational Stress Questionnaire     Feeling of Stress : Only a little  Social Connections: Moderately Integrated (01/03/2023)    Received from [facility]    Social Connection and Isolation Panel [NHANES]     Frequency of Communication with Friends and Family: More than three times a week     Frequency of Social Gatherings with Friends and Family: More than three times a week     Attends Religious Services: More than 4 times per year      Active Member of [employer] or Organizations: Yes     Attends Engineer, structural: More than 4 times per year     Marital Status: Divorced        Objective:         Vitals:    05/23/23 1323  BP: (!) 162/82  Pulse: 80  Temp: 36.8 C (98.2 F)  SpO2: 99%  Weight: 77.3 kg (170 lb 6.4 oz)  Height: 165.1 cm (5\' 5" )  PainSc: 0-No pain    Body mass index is 28.36 kg/m.   Gen:  No acute distress.  Well nourished and well groomed.   Neurological: Alert and oriented to person, place, and time. Coordination normal.  Head: Normocephalic and atraumatic.  Eyes: Conjunctivae are normal. Pupils are equal, round, and reactive to light. No scleral icterus.  Neck: Normal range of motion. Neck supple. No tracheal deviation or thyromegaly present.  Cardiovascular: Normal rate, intact distal pulses.  No murmur heard. Breast: Left breast is surgically absent.  There are no chest wall nodules or lymphadenopathy on the left.  Right breast does have a small hematoma in the upper inner quadrant.  There are several upper chest wall incisions from prior melanoma excisions.  The previous circumareolar incision that I made is well-healed.  There are no palpable masses, skin dimpling, contour change, nipple retraction, or nipple discharge present.  There is no right-sided lymphadenopathy. Respiratory: Effort normal.  No respiratory distress. No chest wall tenderness.  GI: Soft. Bowel sounds are normal. The abdomen is soft and nontender.  There is no rebound and no guarding.  Musculoskeletal: Normal range of motion. Extremities are nontender.  Lymphadenopathy: No cervical, preauricular, postauricular or axillary adenopathy is present Skin: Skin is warm and dry. No rash noted. No diaphoresis. No erythema. No pallor. No clubbing, cyanosis, or edema.   Psychiatric: Normal mood and affect. Behavior is normal. Judgment and thought content normal.      Labs None avail   Assessment and Plan:        ICD-10-CM     1. History of left breast cancer  Z85.3       2. History of right breast cancer  Z85.3       3. Malignant neoplasm of upper-inner quadrant of right breast in female, estrogen receptor positive (CMS/HHS-HCC)  C50.211 Ambulatory Referral to Physical Therapy    Z17.0 Ambulatory Referral to Radiation Oncology     4. Family history of cancer  Z80.9         Patient has a new diagnosis of stage 0 right breast cancer.   We had a long talk regarding the patient's choices.  This certainly is amenable to lumpectomy with a small area of calcifications.  I think we can go through the same incision.  I advised the patient that I think that they would likely recommend radiation if we do a lumpectomy as she did not receive this last time and has a recurrence.  I am not sure if they would recommend resumption of the tamoxifen, but think oncology likely would.   Mastectomy is also possible.  She did tolerate the last mastectomy quite well, but it was 30 years ago.  I discussed that this is a longer recovery than a lumpectomy and would require night in the hospital as well as a drain for several weeks.  I would not plan to take lymph nodes but would do a mag trace injection in the event that we had a significant change in the pathology from what we have now.  I am not sure, but it is possible that if she has a right sided mastectomy, she would not be recommended to receive any adjuvant treatment.  I do recommend asking these questions to radiation oncology and medical oncology if this would help her make a decision about mastectomy versus lumpectomy.   Thank both surgical approaches would not require lymph node excision, so risk of arm lymphedema are extremely low.  This means that she can also continue to get blood draws and blood pressures on the right side.   I discussed that both approaches carry risk of wound issues such as hematoma, infection, seroma, wound breakdown.  I discussed with mastectomy there is a  risk of excess tissue that may be annoying.  I discussed that the scars might not be entirely symmetric.  With a lumpectomy I discussed the risk of contour change in breast size.  This is less concerning to her since she had the mastectomy on the opposite side.   Patient will let us know her decision regarding lumpectomy or mastectomy.  Once this has been accomplished, we will proceed at the first available opportunity.Marland Kitchen

## 2023-06-13 MED ORDER — CHLORHEXIDINE GLUCONATE CLOTH 2 % EX PADS: 6.0000 | MEDICATED_PAD | Freq: Once | CUTANEOUS | Status: DC

## 2023-06-13 NOTE — Progress Notes (Signed)

## 2023-06-14 ENCOUNTER — Ambulatory Visit (HOSPITAL_BASED_OUTPATIENT_CLINIC_OR_DEPARTMENT_OTHER): Payer: Medicare PPO | Admitting: Anesthesiology

## 2023-06-14 ENCOUNTER — Other Ambulatory Visit: Payer: Self-pay

## 2023-06-14 ENCOUNTER — Encounter (HOSPITAL_BASED_OUTPATIENT_CLINIC_OR_DEPARTMENT_OTHER)
Admission: RE | Disposition: A | Discharge status: Home / Self Care | Payer: Self-pay | Source: Home / Self Care | Attending: General Surgery

## 2023-06-14 ENCOUNTER — Ambulatory Visit (HOSPITAL_BASED_OUTPATIENT_CLINIC_OR_DEPARTMENT_OTHER)
Admission: RE | Admit: 2023-06-14 | Discharge: 2023-06-15 | Disposition: A | Discharge status: Home / Self Care | Payer: Medicare PPO | Attending: General Surgery | Admitting: General Surgery

## 2023-06-14 ENCOUNTER — Encounter (HOSPITAL_BASED_OUTPATIENT_CLINIC_OR_DEPARTMENT_OTHER): Payer: Self-pay | Admitting: General Surgery

## 2023-06-14 DIAGNOSIS — N6081 Other benign mammary dysplasias of right breast: Secondary | ICD-10-CM | POA: Diagnosis not present

## 2023-06-14 DIAGNOSIS — Z803 Family history of malignant neoplasm of breast: Secondary | ICD-10-CM | POA: Diagnosis not present

## 2023-06-14 DIAGNOSIS — C50911 Malignant neoplasm of unspecified site of right female breast: Secondary | ICD-10-CM

## 2023-06-14 DIAGNOSIS — N6021 Fibroadenosis of right breast: Secondary | ICD-10-CM | POA: Diagnosis not present

## 2023-06-14 DIAGNOSIS — Z87891 Personal history of nicotine dependence: Secondary | ICD-10-CM | POA: Insufficient documentation

## 2023-06-14 DIAGNOSIS — N62 Hypertrophy of breast: Secondary | ICD-10-CM | POA: Diagnosis not present

## 2023-06-14 DIAGNOSIS — R920 Mammographic microcalcification found on diagnostic imaging of breast: Secondary | ICD-10-CM | POA: Diagnosis not present

## 2023-06-14 DIAGNOSIS — C50211 Malignant neoplasm of upper-inner quadrant of right female breast: Secondary | ICD-10-CM | POA: Diagnosis present

## 2023-06-14 DIAGNOSIS — Z1721 Progesterone receptor positive status: Secondary | ICD-10-CM | POA: Diagnosis not present

## 2023-06-14 DIAGNOSIS — Z170 Estrogen receptor positive status [ER+]: Secondary | ICD-10-CM | POA: Diagnosis not present

## 2023-06-14 DIAGNOSIS — J45909 Unspecified asthma, uncomplicated: Secondary | ICD-10-CM | POA: Insufficient documentation

## 2023-06-14 DIAGNOSIS — N6031 Fibrosclerosis of right breast: Secondary | ICD-10-CM | POA: Diagnosis not present

## 2023-06-14 DIAGNOSIS — C773 Secondary and unspecified malignant neoplasm of axilla and upper limb lymph nodes: Secondary | ICD-10-CM | POA: Insufficient documentation

## 2023-06-14 DIAGNOSIS — I4891 Unspecified atrial fibrillation: Secondary | ICD-10-CM | POA: Diagnosis not present

## 2023-06-14 DIAGNOSIS — K219 Gastro-esophageal reflux disease without esophagitis: Secondary | ICD-10-CM | POA: Diagnosis not present

## 2023-06-14 DIAGNOSIS — I252 Old myocardial infarction: Secondary | ICD-10-CM | POA: Insufficient documentation

## 2023-06-14 DIAGNOSIS — N6001 Solitary cyst of right breast: Secondary | ICD-10-CM | POA: Diagnosis not present

## 2023-06-14 DIAGNOSIS — R238 Other skin changes: Secondary | ICD-10-CM | POA: Diagnosis not present

## 2023-06-14 HISTORY — PX: SIMPLE MASTECTOMY WITH AXILLARY SENTINEL NODE BIOPSY: SHX6098

## 2023-06-14 HISTORY — DX: Family history of other specified conditions: Z84.89

## 2023-06-14 SURGERY — SIMPLE MASTECTOMY
Anesthesia: General | Site: Breast | Laterality: Right

## 2023-06-14 MED ORDER — CEFAZOLIN SODIUM-DEXTROSE 2-4 GM/100ML-% IV SOLN
2.0000 g | Freq: Three times a day (TID) | INTRAVENOUS | Status: AC
  Administered 2023-06-14: 2 g via INTRAVENOUS
  Filled 2023-06-14: qty 100

## 2023-06-14 MED ORDER — CLONIDINE HCL (ANALGESIA) 100 MCG/ML EP SOLN
EPIDURAL | Status: DC | PRN
  Administered 2023-06-14: 100 ug

## 2023-06-14 MED ORDER — FENTANYL CITRATE (PF) 100 MCG/2ML IJ SOLN: 25.0000 ug | INTRAMUSCULAR | Status: DC | PRN

## 2023-06-14 MED ORDER — ONDANSETRON HCL 4 MG/2ML IJ SOLN: 4.0000 mg | Freq: Once | INTRAMUSCULAR | Status: DC | PRN

## 2023-06-14 MED ORDER — TRANEXAMIC ACID 1000 MG/10ML IV SOLN
INTRAVENOUS | Status: AC
  Filled 2023-06-14: qty 30

## 2023-06-14 MED ORDER — DEXAMETHASONE SODIUM PHOSPHATE 10 MG/ML IJ SOLN
INTRAMUSCULAR | Status: DC | PRN
  Administered 2023-06-14: 10 mg

## 2023-06-14 MED ORDER — PROPOFOL 10 MG/ML IV BOLUS
INTRAVENOUS | Status: AC
Start: 2023-06-14 — End: ?
  Filled 2023-06-14: qty 20

## 2023-06-14 MED ORDER — DEXAMETHASONE SODIUM PHOSPHATE 4 MG/ML IJ SOLN
INTRAMUSCULAR | Status: DC | PRN
  Administered 2023-06-14: 4 mg via INTRAVENOUS

## 2023-06-14 MED ORDER — ONDANSETRON HCL 4 MG/2ML IJ SOLN: 4.0000 mg | Freq: Four times a day (QID) | INTRAMUSCULAR | Status: DC | PRN

## 2023-06-14 MED ORDER — PHENYLEPHRINE HCL (PRESSORS) 10 MG/ML IV SOLN
INTRAVENOUS | Status: DC | PRN
  Administered 2023-06-14: 80 ug via INTRAVENOUS

## 2023-06-14 MED ORDER — ONDANSETRON 4 MG PO TBDP: 4.0000 mg | ORAL_TABLET | Freq: Four times a day (QID) | ORAL | Status: DC | PRN

## 2023-06-14 MED ORDER — KETOTIFEN FUMARATE 0.035 % OP SOLN: 2.0000 [drp] | Freq: Two times a day (BID) | OPHTHALMIC | Status: DC

## 2023-06-14 MED ORDER — FENTANYL CITRATE (PF) 100 MCG/2ML IJ SOLN
INTRAMUSCULAR | Status: AC
  Filled 2023-06-14: qty 2

## 2023-06-14 MED ORDER — OXYCODONE HCL 5 MG PO TABS: 5.0000 mg | ORAL_TABLET | Freq: Once | ORAL | Status: DC | PRN

## 2023-06-14 MED ORDER — ONDANSETRON HCL 4 MG/2ML IJ SOLN
INTRAMUSCULAR | Status: AC
  Filled 2023-06-14: qty 2

## 2023-06-14 MED ORDER — ROCURONIUM BROMIDE 10 MG/ML (PF) SYRINGE
PREFILLED_SYRINGE | INTRAVENOUS | Status: AC
  Filled 2023-06-14: qty 20

## 2023-06-14 MED ORDER — APREPITANT 40 MG PO CAPS
40.0000 mg | ORAL_CAPSULE | Freq: Once | ORAL | Status: AC
  Administered 2023-06-14: 40 mg via ORAL

## 2023-06-14 MED ORDER — ACETAMINOPHEN 10 MG/ML IV SOLN: 1000.0000 mg | Freq: Once | INTRAVENOUS | Status: DC | PRN

## 2023-06-14 MED ORDER — GABAPENTIN 100 MG PO CAPS
ORAL_CAPSULE | ORAL | Status: AC
  Filled 2023-06-14: qty 1

## 2023-06-14 MED ORDER — CEFAZOLIN SODIUM-DEXTROSE 2-4 GM/100ML-% IV SOLN
2.0000 g | INTRAVENOUS | Status: AC
  Administered 2023-06-14: 2 g via INTRAVENOUS

## 2023-06-14 MED ORDER — METHOCARBAMOL 500 MG PO TABS: 500.0000 mg | ORAL_TABLET | Freq: Four times a day (QID) | ORAL | Status: DC | PRN

## 2023-06-14 MED ORDER — MIDAZOLAM HCL 2 MG/2ML IJ SOLN
INTRAMUSCULAR | Status: AC
  Filled 2023-06-14: qty 2

## 2023-06-14 MED ORDER — PROPOFOL 500 MG/50ML IV EMUL
INTRAVENOUS | Status: AC
  Filled 2023-06-14: qty 50

## 2023-06-14 MED ORDER — PROCHLORPERAZINE EDISYLATE 10 MG/2ML IJ SOLN: 5.0000 mg | Freq: Four times a day (QID) | INTRAMUSCULAR | Status: DC | PRN

## 2023-06-14 MED ORDER — SCOPOLAMINE 1 MG/3DAYS TD PT72: 1.0000 | MEDICATED_PATCH | Freq: Once | TRANSDERMAL | Status: DC

## 2023-06-14 MED ORDER — FENTANYL CITRATE (PF) 100 MCG/2ML IJ SOLN
INTRAMUSCULAR | Status: DC | PRN
  Administered 2023-06-14 (×2): 50 ug via INTRAVENOUS

## 2023-06-14 MED ORDER — PHENYLEPHRINE 80 MCG/ML (10ML) SYRINGE FOR IV PUSH (FOR BLOOD PRESSURE SUPPORT)
PREFILLED_SYRINGE | INTRAVENOUS | Status: AC
  Filled 2023-06-14: qty 50

## 2023-06-14 MED ORDER — DIPHENHYDRAMINE HCL 50 MG/ML IJ SOLN: 12.5000 mg | Freq: Four times a day (QID) | INTRAMUSCULAR | Status: DC | PRN

## 2023-06-14 MED ORDER — ACETAMINOPHEN 500 MG PO TABS
ORAL_TABLET | ORAL | Status: AC
  Filled 2023-06-14: qty 2

## 2023-06-14 MED ORDER — KCL IN DEXTROSE-NACL 20-5-0.45 MEQ/L-%-% IV SOLN
INTRAVENOUS | Status: AC
  Filled 2023-06-14: qty 1000

## 2023-06-14 MED ORDER — DEXAMETHASONE SODIUM PHOSPHATE 10 MG/ML IJ SOLN
INTRAMUSCULAR | Status: AC
  Filled 2023-06-14: qty 1

## 2023-06-14 MED ORDER — SUCCINYLCHOLINE CHLORIDE 200 MG/10ML IV SOSY
PREFILLED_SYRINGE | INTRAVENOUS | Status: AC
  Filled 2023-06-14: qty 10

## 2023-06-14 MED ORDER — ACETAMINOPHEN 500 MG PO TABS
1000.0000 mg | ORAL_TABLET | ORAL | Status: AC
Start: 2023-06-14 — End: 2023-06-14
  Administered 2023-06-14: 1000 mg via ORAL

## 2023-06-14 MED ORDER — ROPIVACAINE HCL 5 MG/ML IJ SOLN
INTRAMUSCULAR | Status: DC | PRN
  Administered 2023-06-14: 30 mL via EPIDURAL

## 2023-06-14 MED ORDER — LACTATED RINGERS IV SOLN: INTRAVENOUS | Status: DC

## 2023-06-14 MED ORDER — LIDOCAINE 2% (20 MG/ML) 5 ML SYRINGE
INTRAMUSCULAR | Status: AC
  Filled 2023-06-14: qty 5

## 2023-06-14 MED ORDER — METOPROLOL TARTRATE 25 MG PO TABS
25.0000 mg | ORAL_TABLET | Freq: Two times a day (BID) | ORAL | Status: DC
Start: 2023-06-14 — End: 2023-06-15

## 2023-06-14 MED ORDER — PROPOFOL 500 MG/50ML IV EMUL
INTRAVENOUS | Status: DC | PRN
  Administered 2023-06-14: 125 ug/kg/min via INTRAVENOUS
  Administered 2023-06-14: 100 ug/kg/min via INTRAVENOUS

## 2023-06-14 MED ORDER — LORATADINE 10 MG PO TABS: 10.0000 mg | ORAL_TABLET | Freq: Every day | ORAL | Status: DC

## 2023-06-14 MED ORDER — DEXMEDETOMIDINE HCL IN NACL 80 MCG/20ML IV SOLN
INTRAVENOUS | Status: AC
  Filled 2023-06-14: qty 40

## 2023-06-14 MED ORDER — DIPHENHYDRAMINE HCL 12.5 MG/5ML PO ELIX: 12.5000 mg | ORAL_SOLUTION | Freq: Four times a day (QID) | ORAL | Status: DC | PRN

## 2023-06-14 MED ORDER — MAGTRACE LYMPHATIC TRACER
INTRAMUSCULAR | Status: DC | PRN
  Administered 2023-06-14: 2 mL via INTRAMUSCULAR

## 2023-06-14 MED ORDER — CEFAZOLIN SODIUM-DEXTROSE 2-4 GM/100ML-% IV SOLN
INTRAVENOUS | Status: AC
  Filled 2023-06-14: qty 100

## 2023-06-14 MED ORDER — OXYCODONE HCL 5 MG/5ML PO SOLN: 5.0000 mg | Freq: Once | ORAL | Status: DC | PRN

## 2023-06-14 MED ORDER — ACETAMINOPHEN 500 MG PO TABS
1000.0000 mg | ORAL_TABLET | Freq: Four times a day (QID) | ORAL | Status: DC
  Administered 2023-06-14 – 2023-06-15 (×3): 1000 mg via ORAL
  Filled 2023-06-14 (×3): qty 2

## 2023-06-14 MED ORDER — IPRATROPIUM BROMIDE 0.03 % NA SOLN: 1.0000 | Freq: Two times a day (BID) | NASAL | Status: DC

## 2023-06-14 MED ORDER — APREPITANT 40 MG PO CAPS
ORAL_CAPSULE | ORAL | Status: AC
  Filled 2023-06-14: qty 1

## 2023-06-14 MED ORDER — OXYCODONE HCL 5 MG PO TABS: 2.5000 mg | ORAL_TABLET | ORAL | Status: DC | PRN

## 2023-06-14 MED ORDER — PROCHLORPERAZINE MALEATE 10 MG PO TABS: 10.0000 mg | ORAL_TABLET | Freq: Four times a day (QID) | ORAL | Status: DC | PRN

## 2023-06-14 MED ORDER — PROPOFOL 10 MG/ML IV BOLUS
INTRAVENOUS | Status: DC | PRN
  Administered 2023-06-14: 40 mg via INTRAVENOUS
  Administered 2023-06-14: 50 mg via INTRAVENOUS
  Administered 2023-06-14: 100 mg via INTRAVENOUS

## 2023-06-14 MED ORDER — GABAPENTIN 100 MG PO CAPS
100.0000 mg | ORAL_CAPSULE | Freq: Two times a day (BID) | ORAL | Status: DC
  Administered 2023-06-14: 100 mg via ORAL

## 2023-06-14 MED ORDER — PANTOPRAZOLE SODIUM 40 MG PO TBEC: 40.0000 mg | DELAYED_RELEASE_TABLET | Freq: Every day | ORAL | Status: DC

## 2023-06-14 MED ORDER — ONDANSETRON HCL 4 MG/2ML IJ SOLN
INTRAMUSCULAR | Status: DC | PRN
  Administered 2023-06-14: 4 mg via INTRAVENOUS

## 2023-06-14 MED ORDER — TRANEXAMIC ACID 1000 MG/10ML IV SOLN
Status: DC | PRN
  Administered 2023-06-14: 3000 mg via TOPICAL

## 2023-06-14 MED ORDER — TRAMADOL HCL 50 MG PO TABS
50.0000 mg | ORAL_TABLET | Freq: Four times a day (QID) | ORAL | Status: DC | PRN
  Administered 2023-06-14: 50 mg via ORAL
  Filled 2023-06-14: qty 1

## 2023-06-14 SURGICAL SUPPLY — 67 items
ADH SKN CLS APL DERMABOND .7 (GAUZE/BANDAGES/DRESSINGS) ×2
APL PRP STRL LF DISP 70% ISPRP (MISCELLANEOUS) ×1
BAG DECANTER FOR FLEXI CONT (MISCELLANEOUS) IMPLANT
BINDER BREAST LRG (GAUZE/BANDAGES/DRESSINGS) IMPLANT
BINDER BREAST XLRG (GAUZE/BANDAGES/DRESSINGS) IMPLANT
BINDER BREAST XXLRG (GAUZE/BANDAGES/DRESSINGS) IMPLANT
BIOPATCH RED 1 DISK 7.0 (GAUZE/BANDAGES/DRESSINGS) ×1 IMPLANT
BLADE SURG 10 STRL SS (BLADE) ×1 IMPLANT
BLADE SURG 15 STRL LF DISP TIS (BLADE) IMPLANT
BLADE SURG 15 STRL SS (BLADE)
BNDG CMPR 5X3 CHSV STRCH STRL (GAUZE/BANDAGES/DRESSINGS) ×1
BNDG COHESIVE 3X5 TAN ST LF (GAUZE/BANDAGES/DRESSINGS) ×1 IMPLANT
CANISTER SUCT 1200ML W/VALVE (MISCELLANEOUS) ×1 IMPLANT
CHLORAPREP W/TINT 26 (MISCELLANEOUS) ×1 IMPLANT
CLIP TI LARGE 6 (CLIP) IMPLANT
CLIP TI MEDIUM 6 (CLIP) ×1 IMPLANT
CLIP TI WIDE RED SMALL 6 (CLIP) IMPLANT
COVER MAYO STAND STRL (DRAPES) ×1 IMPLANT
DERMABOND ADVANCED .7 DNX12 (GAUZE/BANDAGES/DRESSINGS) IMPLANT
DRAIN CHANNEL 19F RND (DRAIN) ×1 IMPLANT
DRAPE UTILITY XL STRL (DRAPES) ×1 IMPLANT
DRSG TEGADERM 2-3/8X2-3/4 SM (GAUZE/BANDAGES/DRESSINGS) ×1 IMPLANT
ELECT REM PT RETURN 9FT ADLT (ELECTROSURGICAL) ×1
ELECTRODE REM PT RTRN 9FT ADLT (ELECTROSURGICAL) ×1 IMPLANT
EVACUATOR SILICONE 100CC (DRAIN) ×1 IMPLANT
GAUZE PAD ABD 8X10 STRL (GAUZE/BANDAGES/DRESSINGS) ×2 IMPLANT
GAUZE SPONGE 4X4 12PLY STRL (GAUZE/BANDAGES/DRESSINGS) ×1 IMPLANT
GLOVE BIO SURGEON STRL SZ 6 (GLOVE) ×1 IMPLANT
GLOVE BIO SURGEON STRL SZ7 (GLOVE) IMPLANT
GLOVE BIOGEL PI IND STRL 6.5 (GLOVE) ×1 IMPLANT
GLOVE BIOGEL PI IND STRL 7.0 (GLOVE) IMPLANT
GLOVE BIOGEL PI IND STRL 7.5 (GLOVE) IMPLANT
GLOVE ECLIPSE 6.5 STRL STRAW (GLOVE) IMPLANT
GLOVE INDICATOR 7.0 STRL GRN (GLOVE) IMPLANT
GOWN STRL REUS W/ TWL LRG LVL3 (GOWN DISPOSABLE) ×1 IMPLANT
GOWN STRL REUS W/ TWL XL LVL3 (GOWN DISPOSABLE) ×1 IMPLANT
GOWN STRL REUS W/TWL LRG LVL3 (GOWN DISPOSABLE) ×3
GOWN STRL REUS W/TWL XL LVL3 (GOWN DISPOSABLE) ×2
LIGHT WAVEGUIDE WIDE FLAT (MISCELLANEOUS) ×1 IMPLANT
NDL HYPO 25X1 1.5 SAFETY (NEEDLE) IMPLANT
NEEDLE HYPO 25X1 1.5 SAFETY (NEEDLE) ×1
NS IRRIG 1000ML POUR BTL (IV SOLUTION) ×1 IMPLANT
PACK BASIN DAY SURGERY FS (CUSTOM PROCEDURE TRAY) ×1 IMPLANT
PACK UNIVERSAL I (CUSTOM PROCEDURE TRAY) ×1 IMPLANT
PENCIL SMOKE EVACUATOR (MISCELLANEOUS) ×1 IMPLANT
PIN SAFETY STERILE (MISCELLANEOUS) ×1 IMPLANT
SLEEVE SCD COMPRESS KNEE MED (STOCKING) ×1 IMPLANT
SPIKE FLUID TRANSFER (MISCELLANEOUS) IMPLANT
SPONGE T-LAP 18X18 ~~LOC~~+RFID (SPONGE) ×2 IMPLANT
STAPLER SKIN PROX WIDE 3.9 (STAPLE) IMPLANT
STOCKINETTE IMPERVIOUS LG (DRAPES) ×1 IMPLANT
STRIP CLOSURE SKIN 1/2X4 (GAUZE/BANDAGES/DRESSINGS) ×3 IMPLANT
SUT ETHILON 2 0 FS 18 (SUTURE) ×1 IMPLANT
SUT MNCRL AB 4-0 PS2 18 (SUTURE) ×1 IMPLANT
SUT SILK 0 TIES 10X30 (SUTURE) IMPLANT
SUT SILK 2 0 SH (SUTURE) ×1 IMPLANT
SUT VIC AB 3-0 SH 27 (SUTURE)
SUT VIC AB 3-0 SH 27X BRD (SUTURE) IMPLANT
SUT VICRYL 3-0 CR8 SH (SUTURE) ×3 IMPLANT
SUT VICRYL AB 2 0 TIE (SUTURE) IMPLANT
SUT VICRYL AB 2 0 TIES (SUTURE)
SYR CONTROL 10ML LL (SYRINGE) IMPLANT
TOWEL GREEN STERILE FF (TOWEL DISPOSABLE) ×1 IMPLANT
TRACER MAGTRACE VIAL (MISCELLANEOUS) IMPLANT
TUBE CONNECTING 20X1/4 (TUBING) ×1 IMPLANT
UNDERPAD 30X36 HEAVY ABSORB (UNDERPADS AND DIAPERS) ×1 IMPLANT
YANKAUER SUCT BULB TIP NO VENT (SUCTIONS) ×1 IMPLANT

## 2023-06-14 NOTE — Progress Notes (Signed)
Assisted Dr. Ace Gins with right, pectoralis, ultrasound guided block. Side rails up, monitors on throughout procedure. See vital signs in flow sheet. Tolerated Procedure well.

## 2023-06-14 NOTE — Interval H&P Note (Signed)
History and Physical Interval Note:  06/14/2023 12:21 PM  Rebecca Guzman  has presented today for surgery, with the diagnosis of RIGHT BREAST CANCER.  The various methods of treatment have been discussed with the patient and family. After consideration of risks, benefits and other options for treatment, the patient has consented to  Procedure(s) with comments: RIGHT MASTECTOMY WITH MAGTRACE INJECTION (Right) - PEC BLOCK as a surgical intervention.  The patient's history has been reviewed, patient examined, no change in status, stable for surgery.  I have reviewed the patient's chart and labs.  Questions were answered to the patient's satisfaction.     Almond Lint

## 2023-06-14 NOTE — Anesthesia Procedure Notes (Signed)
Procedure Name: LMA Insertion Date/Time: 06/14/2023 1:13 PM  Performed by: Burna Cash, CRNAPre-anesthesia Checklist: Patient identified, Emergency Drugs available, Suction available and Patient being monitored Patient Re-evaluated:Patient Re-evaluated prior to induction Oxygen Delivery Method: Circle system utilized Preoxygenation: Pre-oxygenation with 100% oxygen Induction Type: IV induction Ventilation: Mask ventilation without difficulty LMA: LMA inserted LMA Size: 4.0 Number of attempts: 2 Airway Equipment and Method: Bite block Placement Confirmation: positive ETCO2 Tube secured with: Tape Dental Injury: Teeth and Oropharynx as per pre-operative assessment

## 2023-06-14 NOTE — Anesthesia Preprocedure Evaluation (Addendum)
Anesthesia Evaluation  Patient identified by MRN, date of birth, ID band Patient awake    Reviewed: Allergy & Precautions, NPO status , Patient's Chart, lab work & pertinent test results, reviewed documented beta blocker date and time   History of Anesthesia Complications (+) PONV, Family history of anesthesia reaction and history of anesthetic complications  Airway Mallampati: III  TM Distance: >3 FB     Dental no notable dental hx.    Pulmonary asthma , neg COPD, neg recent URI, former smoker   breath sounds clear to auscultation       Cardiovascular (-) hypertension(-) angina (-) CAD and (-) Past MI + dysrhythmias Atrial Fibrillation  Rhythm:Regular Rate:Normal     Neuro/Psych neg Seizures  Neuromuscular disease    GI/Hepatic hiatal hernia,GERD  ,,(+) neg Cirrhosis        Endo/Other  neg diabetes    Renal/GU Renal disease     Musculoskeletal  (+) Arthritis ,    Abdominal   Peds  Hematology  (+) Blood dyscrasia, anemia   Anesthesia Other Findings   Reproductive/Obstetrics                             Anesthesia Physical Anesthesia Plan  ASA: 2  Anesthesia Plan: General   Post-op Pain Management: Regional block*   Induction: Intravenous  PONV Risk Score and Plan: 2 and Ondansetron, Aprepitant, Dexamethasone and TIVA  Airway Management Planned: LMA  Additional Equipment:   Intra-op Plan:   Post-operative Plan: Extubation in OR  Informed Consent: I have reviewed the patients History and Physical, chart, labs and discussed the procedure including the risks, benefits and alternatives for the proposed anesthesia with the patient or authorized representative who has indicated his/her understanding and acceptance.     Dental advisory given  Plan Discussed with: CRNA  Anesthesia Plan Comments:        Anesthesia Quick Evaluation

## 2023-06-14 NOTE — Discharge Instructions (Signed)
[facility] [phone]  MASTECTOMY: POST OP INSTRUCTIONS  Always review your discharge instruction sheet given to you by the facility where your surgery was performed. IF YOU HAVE DISABILITY OR FAMILY LEAVE FORMS, YOU MUST BRING THEM TO THE OFFICE FOR PROCESSING.   DO NOT GIVE THEM TO YOUR DOCTOR. Take 2 tylenol (acetominophen) three times a day for 3 days.  If you still have pain, add ibuprofen with food in between if able to take this (if you have kidney  issues or stomach issues, do not take ibuprofen).  There is also a muscle relaxant for muscle spasms.    I have written a script for gabapentin.  This is helpful for burning type pain and is useful for several weeks to take twice daily.   If both of those are not enough, add the narcotic pain pill.    If you find you are needing a lot of this overnight after surgery, call the next morning for a refill.    Take your usually prescribed medications unless otherwise directed. If you need a refill on your pain medication, please contact your pharmacy.  They will contact our office to request authorization.  Prescriptions will not be filled after 5pm or on week-ends. You should follow a light diet the first few days after arrival home, such as soup and crackers, etc.  Resume your normal diet the day after surgery. Most patients will experience some swelling and bruising on the chest and underarm.  Ice packs will help.  Swelling and bruising can take several days to resolve.  It is common to experience some constipation if taking pain medication after surgery.  Increasing fluid intake and taking a stool softener (such as Colace) will usually help or prevent this problem from occurring.  A mild laxative (Milk of Magnesia or Miralax) should be taken according to package instructions if there are no bowel movements after 48 hours. You may remove the gauze from under the binder and replace as needed.  Keep the drains pinned  under the binder unless you are emptying them.  SHORT showers only are permitted after post op day 3.  DRAINS:  If you have drains in place, it is important to keep a list of the amount of drainage produced each day in your drains.  Before leaving the hospital, you should be instructed on drain care.  Call our office if you have any questions about your drains. ACTIVITIES:  You may resume regular (light) daily activities beginning the next day--such as daily self-care, walking, climbing stairs--gradually increasing activities as tolerated.  You may have sexual intercourse when it is comfortable.  Refrain from any heavy lifting or straining until approved by your doctor. You may drive when you are no longer taking prescription pain medication, you can comfortably wear a seatbelt, and you can safely maneuver your car and apply brakes. RETURN TO WORK:  __________________________________________________________ Rebecca Guzman should see your doctor in the office for a follow-up appointment approximately 3-5 days after your surgery.  Your doctor's nurse will typically make your follow-up appointment when she calls you with your pathology report.  Expect your pathology report 2-3 business days after your surgery.  You may call to check if you do not hear from [place] after three days.   OTHER INSTRUCTIONS: ______________________________________________________________________________________________ ____________________________________________________________________________________________ WHEN TO CALL YOUR DOCTOR: Fever over 101.0 Nausea and/or vomiting Extreme swelling or bruising Continued bleeding from incision. Increased pain, redness, or drainage from the incision. The clinic staff is available to  answer your questions during regular business hours.  Please don't hesitate to call and ask to speak to one of the nurses for clinical concerns.  If you have a medical emergency, go to the nearest emergency room or call 911.   A surgeon from [facility] is always on call at the hospital. [address], [address], [place], [place]  [address] ? [address] [address], [place], [place]   [address] [phone] ? [phone] ? FAX [phone] Web site: [email or web address]  About my Jackson-Pratt Bulb Drain  What is a Jackson-Pratt bulb? A Jackson-Pratt is a soft, round device used to collect drainage. It is connected to a long, thin drainage catheter, which is held in place by one or two small stiches near your surgical incision site. When the bulb is squeezed, it forms a vacuum, forcing the drainage to empty into the bulb.  Emptying the Jackson-Pratt bulb- To empty the bulb: 1. Release the plug on the top of the bulb. 2. Pour the bulb's contents into a measuring container which your nurse will provide. 3. Record the time emptied and amount of drainage. Empty the drain(s) as often as your     doctor or nurse recommends.  Date                  Time                    Amount (Drain 1)                 Amount (Drain 2)  _____________________________________________________________________  _____________________________________________________________________  _____________________________________________________________________  _____________________________________________________________________  _____________________________________________________________________  _____________________________________________________________________  _____________________________________________________________________  _____________________________________________________________________  Squeezing the Jackson-Pratt Bulb- To squeeze the bulb: 1. Make sure the plug at the top of the bulb is open. 2. Squeeze the bulb tightly in your fist. You will hear air squeezing from the bulb. 3. Replace the plug while the bulb is squeezed. 4. Use a safety pin to attach the bulb to your clothing. This will keep the catheter from      pulling at the bulb insertion site.  When to call your doctor- Call your doctor if: Drain site becomes red, swollen or hot. You have a fever greater than 101 degrees F. There is oozing at the drain site. Drain falls out (apply a guaze bandage over the drain hole and secure it with tape). Drainage increases daily not related to activity patterns. (You will usually have more drainage when you are active than when you are resting.) Drainage has a bad odor.  Next dose of tylenol if needed is at 1:30pm

## 2023-06-14 NOTE — Transfer of Care (Signed)
Immediate Anesthesia Transfer of Care Note  Patient: CHANAE GEMMA  Procedure(s) Performed: RIGHT MASTECTOMY WITH MAGTRACE INJECTION (Right: Breast)  Patient Location: PACU  Anesthesia Type:General  Level of Consciousness: drowsy and patient cooperative  Airway & Oxygen Therapy: Patient Spontanous Breathing and Patient connected to face mask oxygen  Post-op Assessment: Report given to RN and Post -op Vital signs reviewed and stable  Post vital signs: Reviewed and stable  Last Vitals:  Vitals Value Taken Time  BP 146/68 06/14/23 1530  Temp    Pulse 75 06/14/23 1531  Resp 18 06/14/23 1531  SpO2 99 % 06/14/23 1531  Vitals shown include unfiled device data.  Last Pain:  Vitals:   06/14/23 1043  TempSrc: Oral  PainSc: 0-No pain         Complications: No notable events documented.

## 2023-06-14 NOTE — Anesthesia Procedure Notes (Signed)
Anesthesia Regional Block: Pectoralis block   Pre-Anesthetic Checklist: , timeout performed,  Correct Patient, Correct Site, Correct Laterality,  Correct Procedure, Correct Position, site marked,  Risks and benefits discussed,  Surgical consent,  Pre-op evaluation,  At surgeon's request and post-op pain management  Laterality: Right  Prep: Maximum Sterile Barrier Precautions used, chloraprep       Needles:  Injection technique: Single-shot  Needle Type: Echogenic Needle      Needle Gauge: 20     Additional Needles:   Procedures:,,,, ultrasound used (permanent image in chart),,    Narrative:  Start time: 06/14/2023 11:10 AM End time: 06/14/2023 11:15 AM Injection made incrementally with aspirations every 5 mL.  Performed by: Personally  Anesthesiologist: Mariann Barter, MD

## 2023-06-14 NOTE — Op Note (Signed)
Right Mastectomy with MagTrace injection  Indications: This patient presents with history of recurrent right breast cancer and history of left breast cancer  Pre-operative Diagnosis: Right breast cancer, cTis, upper inner quadrant, receptors +/+  Post-operative Diagnosis: same  Surgeon: Almond Lint   Assistant:  Jenell Milliner, RNFA  Anesthesia: General endotracheal anesthesia and pectoral block  ASA Class: 2  Procedure Details  The patient was seen in the Holding Room. The risks, benefits, complications, treatment options, and expected outcomes were discussed with the patient. The possibilities of reaction to medication, pulmonary aspiration, bleeding, infection, the need for additional procedures, failure to diagnose a condition, and creating a complication requiring transfusion or operation were discussed with the patient. The patient concurred with the proposed plan, giving informed consent.  The site of surgery properly noted/marked. The patient was taken to Operating Room # 5, identified as Amadeo Garnet and the procedure verified as right Mastectomy with MagTrace injection. A Time Out was held and the above information confirmed.  The magtrace was injected in the subareolar location.    After induction of anesthesia, the right arm and bilateral chest were prepped and draped in standard fashion.  The borders of the breast were identified and marked.  The incision was drawn out to make sure incision lines were equidistant in length.   Attempts were made to make this symmetric with the opposite side The superior incision was made with the #10 blade.  Mastectomy hooks were used to provide elevation of the skin edges, and the cautery was used to create the mastectomy flaps.  The dissection was taken to the fascia of the pectoralis major.  The penetrating vessels were clipped as needed.  The superior flap was taken medially to the lateral sternal border, superiorly to the inferior border of the  clavicle.  The inferior flap was similarly created, inferiorly to the inframammary fold and laterally to the border of the latissimus.  The breast was taken off including the pectoralis fascia and the axillary tail marked.  A low-lying lymph node was enlarged and this was taken.    A lap soaked with TXA was placed into the cavity and pressure held for 3 minutes.      The wound was irrigated.  Hemostasis was achieved with cautery.  One 19 Blake drain was placed laterally and secured with a 2-0 nylon.  The wound was irrigated and closed with a 3-0 Vicryl deep dermal interrupted sutures and 4-0 Vicryl subcuticular closure in layers.    Sterile dressings were applied. At the end of the operation, all sponge, instrument, and needle counts were correct.  Findings: grossly clear surgical margins  Estimated Blood Loss: 50 mL          Drains: 19 Fr blake drain in right lower chest wall                Specimens: right breast, right axillary lymph node.          Complications:  None; patient tolerated the procedure well.         Disposition: PACU - hemodynamically stable.         Condition: stable

## 2023-06-15 ENCOUNTER — Encounter (HOSPITAL_BASED_OUTPATIENT_CLINIC_OR_DEPARTMENT_OTHER): Payer: Self-pay | Admitting: General Surgery

## 2023-06-15 MED ORDER — TRAMADOL HCL 50 MG PO TABS: 50.0000 mg | ORAL_TABLET | Freq: Four times a day (QID) | ORAL | 1 refills | Status: DC | PRN

## 2023-06-15 MED ORDER — GABAPENTIN 100 MG PO CAPS: 100.0000 mg | ORAL_CAPSULE | Freq: Two times a day (BID) | ORAL | 1 refills | Status: DC

## 2023-06-15 MED ORDER — ACETAMINOPHEN 500 MG PO TABS: 1000.0000 mg | ORAL_TABLET | Freq: Four times a day (QID) | ORAL | 0 refills | Status: AC

## 2023-06-15 NOTE — Anesthesia Postprocedure Evaluation (Signed)
Anesthesia Post Note  Patient: Rebecca Guzman  Procedure(s) Performed: RIGHT MASTECTOMY WITH MAGTRACE INJECTION (Right: Breast)     Patient location during evaluation: PACU Anesthesia Type: General Level of consciousness: awake and alert Pain management: pain level controlled Vital Signs Assessment: post-procedure vital signs reviewed and stable Respiratory status: spontaneous breathing, nonlabored ventilation, respiratory function stable and patient connected to nasal cannula oxygen Cardiovascular status: blood pressure returned to baseline and stable Postop Assessment: no apparent nausea or vomiting Anesthetic complications: no   No notable events documented.               Shelton Silvas

## 2023-06-15 NOTE — Discharge Summary (Signed)
Physician Discharge Summary  Patient ID: Rebecca Guzman MRN: [id number] DOB/AGE: 1933/10/30 89 y.o.  Admit date: 06/14/2023 Discharge date: 06/15/2023  Admission Diagnoses: Patient Active Problem List   Diagnosis Date Noted   Breast cancer of upper-inner quadrant of right female breast (HCC) 06/14/2023   Anemia 09/21/2021   Routine general medical examination at a health care facility 09/21/2021   Hx of total knee arthroplasty, right 05/21/2021   Genetic testing 06/07/2018   History of breast cancer 04/19/2018   Wears hearing aid    Wears glasses    Vaginal vault prolapse    Hiatal hernia    BPPV (benign paroxysmal positional vertigo)    Palpitations 10/31/2016   Radicular pain of right lower back 04/06/2015   Compression fracture of L3 vertebra (HCC) 04/06/2015   Senile osteoporosis 04/06/2015   DDD (degenerative disc disease), lumbar 04/06/2015   Fatigue 01/17/2014   Hx of adenomatous colonic polyps 07/11/2013   Hyperlipidemia LDL goal <100    Bladder prolapse, female, acquired    Esophageal reflux 10/12/2011    Discharge Diagnoses:  Principal Problem:   Breast cancer of upper-inner quadrant of right female breast [facility]) And same as above  Discharged Condition: stable  Hospital Course:  Patient was admitted to the [facility] after right mastectomy with magtrace injection for recurrent or new primary stage 0 right breast cancer 06/14/2023.  She did very well overnight with minimal discomfort.  She took only tramadol and tylenol for pain.  She was ambulatory.  Drain output was low and serosanguinous.  She was able to void.  Vitals were acceptable. Patient and daughter were educated in drain care.  She is discharged in stable condition.   Consults: None  Significant Diagnostic Studies: n/a  Treatments: surgery: see above  Discharge Exam: Blood pressure 132/65, pulse 69, temperature (!) 97.5 F (36.4 C), resp. rate 16, height 5\' 5"  (1.651 m), weight 76.6 kg, last menstrual  period 11/21/1985, SpO2 97%. General appearance: alert, cooperative, and no distress Resp: breathing comfortably Chest wall: anticipated right sided chest wall tenderness. No appreciable flap hematoma. Drain serosang.   Disposition: Discharge disposition: 01-Home or Self Care       Discharge Instructions     Call MD for:  difficulty breathing, headache or visual disturbances   Complete by: As directed    Call MD for:  hives   Complete by: As directed    Call MD for:  persistant nausea and vomiting   Complete by: As directed    Call MD for:  redness, tenderness, or signs of infection (pain, swelling, redness, odor or green/yellow discharge around incision site)   Complete by: As directed    Call MD for:  severe uncontrolled pain   Complete by: As directed    Call MD for:  temperature >100.4   Complete by: As directed    Change dressing (specify)   Complete by: As directed    Dry gauze as needed under the bra.  Measure and record drain output 1-3 times per day.  Recharge drain. Bring drain record to clinic.   Diet - low sodium heart healthy   Complete by: As directed    Increase activity slowly   Complete by: As directed       Allergies as of 06/15/2023       Reactions   Codeine Nausea And Vomiting   Morphine And Codeine Other (See Comments)   Nausea, and drop in blood pressure        Medication  List     TAKE these medications    acetaminophen 500 MG tablet Commonly known as: TYLENOL Take 1 tablet (500 mg total) by mouth 2 (two) times daily as needed. What changed: Another medication with the same name was added. Make sure you understand how and when to take each.   acetaminophen 500 MG tablet Commonly known as: TYLENOL Take 2 tablets (1,000 mg total) by mouth every 6 (six) hours. What changed: You were already taking a medication with the same name, and this prescription was added. Make sure you understand how and when to take each.   azelastine 0.05 %  ophthalmic solution Commonly known as: OPTIVAR 1 drop 2 (two) times daily.   Calcium 600+D3 Plus Minerals 600-800 MG-UNIT Tabs Take 2 tablets by mouth daily.   denosumab 60 MG/ML Sosy injection Commonly known as: PROLIA Inject 60 mg into the skin every 6 (six) months.   fexofenadine 180 MG tablet Commonly known as: ALLEGRA Take 180 mg by mouth daily.   Fish Oil 1000 MG Caps Take 1 capsule (1,000 mg total) by mouth daily.   gabapentin 100 MG capsule Commonly known as: NEURONTIN Take 1 capsule (100 mg total) by mouth 2 (two) times daily.   ipratropium 0.03 % nasal spray Commonly known as: ATROVENT Place into both nostrils.   metoprolol tartrate 25 MG tablet Commonly known as: LOPRESSOR TAKE 1 TABLET(25 MG) BY MOUTH TWICE DAILY   multivitamin tablet Take 1 tablet by mouth daily.   omeprazole 40 MG capsule Commonly known as: PRILOSEC TAKE 1 CAPSULE(40 MG) BY MOUTH DAILY   PRESERVISION AREDS 2 PO Take 1 tablet by mouth daily.   PROBIOTIC FORMULA PO Take 1 tablet by mouth every evening.   rosuvastatin 5 MG tablet Commonly known as: CRESTOR Take 1 tablet (5 mg total) by mouth 4 (four) times a week. Please keep scheduled appointment with Dr. Bjorn Pippin for future refills. Thank you.   traMADol 50 MG tablet Commonly known as: ULTRAM Take 1 tablet (50 mg total) by mouth every 6 (six) hours as needed (mild pain).               Discharge Care Instructions  (From admission, onward)           Start     Ordered   06/15/23 0000  Change dressing (specify)       Comments: Dry gauze as needed under the bra.  Measure and record drain output 1-3 times per day.  Recharge drain. Bring drain record to clinic.   06/15/23 1610            Follow-up Information     Almond Lint, MD Follow up in 2 week(s).   Specialty: General Surgery Contact information: [address] [place] [address] [phone]                 Signed: Almond Lint 06/15/2023, 9:30 AM

## 2023-06-16 ENCOUNTER — Telehealth: Payer: Self-pay | Admitting: Hematology and Oncology

## 2023-06-16 NOTE — Telephone Encounter (Signed)
Per scheduling message patient is aware of scheduled appointment times/dates

## 2023-06-19 ENCOUNTER — Encounter: Payer: Self-pay | Admitting: *Deleted

## 2023-06-21 ENCOUNTER — Telehealth: Payer: Self-pay | Admitting: General Surgery

## 2023-06-21 NOTE — Telephone Encounter (Signed)
Discussed pathology with patient.  Will get follow up with me next week to assess drain.  She has appt with Dr. Pamelia Hoit the day before thanksgiving.

## 2023-06-22 LAB — SURGICAL PATHOLOGY

## 2023-07-05 ENCOUNTER — Inpatient Hospital Stay: Payer: Medicare PPO | Attending: Hematology and Oncology | Admitting: Hematology and Oncology

## 2023-07-05 VITALS — HR 85 | Temp 96.5°F | Resp 16 | Wt 169.3 lb

## 2023-07-05 DIAGNOSIS — Z853 Personal history of malignant neoplasm of breast: Secondary | ICD-10-CM | POA: Diagnosis not present

## 2023-07-05 DIAGNOSIS — C50211 Malignant neoplasm of upper-inner quadrant of right female breast: Secondary | ICD-10-CM | POA: Insufficient documentation

## 2023-07-05 DIAGNOSIS — M8580 Other specified disorders of bone density and structure, unspecified site: Secondary | ICD-10-CM | POA: Insufficient documentation

## 2023-07-05 DIAGNOSIS — Z9013 Acquired absence of bilateral breasts and nipples: Secondary | ICD-10-CM | POA: Diagnosis not present

## 2023-07-05 DIAGNOSIS — Z170 Estrogen receptor positive status [ER+]: Secondary | ICD-10-CM | POA: Insufficient documentation

## 2023-07-05 DIAGNOSIS — T8189XA Other complications of procedures, not elsewhere classified, initial encounter: Secondary | ICD-10-CM

## 2023-07-05 DIAGNOSIS — C50112 Malignant neoplasm of central portion of left female breast: Secondary | ICD-10-CM

## 2023-07-05 DIAGNOSIS — Z483 Aftercare following surgery for neoplasm: Secondary | ICD-10-CM | POA: Diagnosis not present

## 2023-07-05 MED ORDER — TAMOXIFEN CITRATE 10 MG PO TABS: 10.0000 mg | ORAL_TABLET | Freq: Every day | ORAL | 3 refills | Status: DC

## 2023-07-05 NOTE — Progress Notes (Signed)
Patient Care Team: Myrlene Broker, MD as PCP - General (Internal Medicine) Lars Masson, MD as PCP - Cardiology (Cardiology) Almond Lint, MD as Consulting Physician (General Surgery) Serena Croissant, MD as Consulting Physician (Hematology and Oncology) Antony Blackbird, MD as Consulting Physician (Radiation Oncology) Axel Filler Larna Daughters, NP as Nurse Practitioner (Hematology and Oncology) [facility] as Consulting Physician (Ophthalmology)  DIAGNOSIS:  Encounter Diagnosis  Name Primary?   History of breast cancer Yes    SUMMARY OF ONCOLOGIC HISTORY: Oncology History Overview Note  PMS2 VUS on Multi-cancer panel.   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, ER 100%, PR 100%, HER-2 -1+ by IHC, T1 a N0 stage I a clinical stage   05/17/2018 Surgery   Right lumpectomy: IDC, 5.5 mm, grade 1, margins negative, ER 100%, PR 100%, HER-2 1+ by IHC, Ki-67 less than 1%, T1BNX stage Ia   05/28/2018 -  Anti-estrogen oral therapy   Letrozole daily discontinued 03/04/19, switched to anastrozole 03/23/2019, switched to tamoxifen 06/04/19   05/30/2018 Cancer Staging   Staging form: Breast, AJCC 8th Edition - Pathologic: Stage IA (pT1b, pN0, cM0, G1, ER+, PR+, HER2-) - Signed by Loa Socks, NP on 05/30/2018   06/05/2018 Genetic Testing   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  The Multi-Gene Panel offered by [facility] includes sequencing and/or deletion duplication testing of the following 84 genes: AIP, ALK, APC, ATM, AXIN2,BAP1,  BARD1, BLM, BMPR1A, BRCA1, BRCA2, BRIP1, CASR, CDC73, CDH1, CDK4, CDKN1B, CDKN1C, CDKN2A (p14ARF), CDKN2A (p16INK4a), CEBPA, CHEK2, CTNNA1,  DICER1, DIS3L2, EGFR (c.2369C>T, p.Thr790Met variant only), EPCAM (Deletion/duplication testing only), FH, FLCN, GATA2, GPC3, GREM1 (Promoter region deletion/duplication testing only), HOXB13 (c.251G>A, p.Gly84Glu), HRAS, KIT, MAX, MEN1, MET, MITF (c.952G>A, p.Glu318Lys variant only), MLH1, MSH2, MSH3, MSH6, MUTYH, NBN, NF1, NF2, NTHL1, PALB2, PDGFRA, PHOX2B, PMS2, POLD1, POLE, POT1, PRKAR1A, PTCH1, PTEN, RAD50, RAD51C, RAD51D, RB1, RECQL4, RET, RUNX1, SDHAF2, SDHA (sequence changes only), SDHB, SDHC, SDHD, SMAD4, SMARCA4, SMARCB1, SMARCE1, STK11, SUFU, TERC, TERT, TMEM127, TP53, TSC1, TSC2, VHL, WRN and WT1.  The report date is 06/05/2018.     CHIEF COMPLIANT: Follow-up after recent mastectomy  HISTORY OF PRESENT ILLNESS:  History of Present Illness   The patient, with a history of breast cancer, underwent a mastectomy approximately three weeks ago. She reports an increase in drainage from the surgical site over the past few days, noting that the drainage seems to increase with activity. The patient expresses frustration with the drain, stating it is uncomfortable and interferes with sleep.  In addition to the drainage issue, the patient is concerned about the pathology results from the mastectomy. The results indicate that the breast cancer was ductal, grade 2, and had invaded the lymphatic duct. Two of the three lymph nodes removed during the mastectomy were positive for cancer. The patient is particularly concerned about the presence of cancer in the lymph nodes and is seeking clarification and advice on further treatment options.  The patient has a history of difficulty tolerating hormone therapies, having tried three different medications in the past, all of which were discontinued due to severe fatigue. The patient expresses reluctance to start hormone therapy again but is open to discussing options.  ALLERGIES:  is allergic to codeine and morphine and codeine.  MEDICATIONS:   Current Outpatient Medications  Medication Sig Dispense Refill   tamoxifen (NOLVADEX) 10 MG tablet Take 1 tablet (10 mg total) by mouth daily. 90 tablet 3   acetaminophen (TYLENOL) 500 MG tablet Take 1 tablet (500 mg total) by mouth 2 (two) times daily as needed. 30 tablet 0   acetaminophen (TYLENOL) 500 MG tablet Take 2 tablets (1,000 mg total) by mouth every 6 (six) hours. 30 tablet 0   azelastine (OPTIVAR) 0.05 % ophthalmic solution 1 drop 2 (two) times daily.     Calcium Carbonate-Vit D-Min (CALCIUM 600+D3 PLUS MINERALS) 600-800 MG-UNIT TABS Take 2 tablets by mouth daily. 180 tablet 3   denosumab (PROLIA) 60 MG/ML SOSY injection Inject 60 mg into the skin every 6 (six) months.     fexofenadine (ALLEGRA) 180 MG tablet Take 180 mg by mouth daily.     gabapentin (NEURONTIN) 100 MG capsule Take 1 capsule (100 mg total) by mouth 2 (two) times daily. 30 capsule 1   ipratropium (ATROVENT) 0.03 % nasal spray Place into both nostrils.     metoprolol tartrate (LOPRESSOR) 25 MG tablet TAKE 1 TABLET(25 MG) BY MOUTH TWICE DAILY 180 tablet 2   Multiple Vitamin (MULTIVITAMIN) tablet Take 1 tablet by mouth daily.     Multiple Vitamins-Minerals (PRESERVISION AREDS 2 PO) Take 1 tablet by mouth daily.     Omega-3 Fatty Acids (FISH OIL) 1000 MG CAPS Take 1 capsule (1,000 mg total) by mouth daily.  0   omeprazole (PRILOSEC) 40 MG capsule TAKE 1 CAPSULE(40 MG) BY MOUTH DAILY 90 capsule 3   Probiotic Product (PROBIOTIC FORMULA PO) Take 1 tablet by mouth every evening.      rosuvastatin (CRESTOR) 5 MG tablet Take 1 tablet (5 mg total) by mouth 4 (four) times a week. Please keep scheduled appointment with Dr. Bjorn Pippin for future refills. Thank you. 48 tablet 0   traMADol (ULTRAM) 50 MG tablet Take 1 tablet (50 mg total) by mouth every 6 (six) hours as needed (mild pain). 20 tablet 1   No current facility-administered medications for this visit.    PHYSICAL EXAMINATION: ECOG PERFORMANCE STATUS: 1 - Symptomatic  but completely ambulatory  Vitals:   07/05/23 1200  Pulse: 85  Resp: 16  Temp: (!) 96.5 F (35.8 C)  SpO2: 99%   Filed Weights   07/05/23 1200  Weight: 169 lb 4.8 oz (76.8 kg)     LABORATORY DATA:  I have reviewed the data as listed    Latest Ref Rng & Units 09/21/2022   10:35 AM 05/18/2021    9:40 AM 03/29/2021    2:41 PM  CMP  Glucose 70 - 99 mg/dL 79  88  86   BUN 6 - 23 mg/dL 23  14  15    Creatinine 0.40 - 1.20 mg/dL 5.28  4.13  2.44   Sodium 135 - 145 mEq/L 139  139  137   Potassium 3.5 - 5.1 mEq/L 4.3  3.9  4.4   Chloride 96 - 112 mEq/L 105  106  103   CO2 19 - 32 mEq/L 29  28  27    Calcium 8.4 - 10.5 mg/dL 01.0  9.1  9.8   Total Protein 6.0 - 8.3 g/dL 7.3  6.6  7.1   Total Bilirubin 0.2 - 1.2 mg/dL 0.5  0.7  0.4   Alkaline Phos 39 - 117 U/L 50  57  57   AST  0 - 37 U/L 19  21  17    ALT 0 - 35 U/L 17  18  15      Lab Results  Component Value Date   WBC 9.5 09/21/2022   HGB 13.7 09/21/2022   HCT 40.0 09/21/2022   MCV 90.0 09/21/2022   PLT 228.0 09/21/2022   NEUTROABS 4,449 05/08/2020    ASSESSMENT & PLAN:  History of breast cancer 05/17/2018: Right lumpectomy: IDC, 5.5 mm, grade 1, margins negative, ER 100%, PR 100%, HER-2 1+ by IHC, Ki-67 less than 1%, T1BNX stage Ia History of left mastectomy 1992 We did not recommend adjuvant radiation therapy because of her favorable profile and her age.   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 She does not wish to pursue radiation.  She is willing to try tamoxifen at 10 mg a day. She will start this in a couple of weeks after the drain has come out.  Telephone visit in 2 months to discuss tolerance to tamoxifen.   No orders of the defined types were placed in this encounter.  The patient has a good understanding of the overall plan. she agrees with it. she will call with any problems that may develop before the next visit here. Total time spent: 30 mins including face to face time and time spent for planning, charting and co-ordination of care   Tamsen Meek, MD 07/05/23

## 2023-07-05 NOTE — Assessment & Plan Note (Addendum)
05/17/2018: Right lumpectomy: IDC, 5.5 mm, grade 1, margins negative, ER 100%, PR 100%, HER-2 1+ by IHC, Ki-67 less than 1%, T1BNX stage Ia History of left mastectomy 1992 We did not recommend adjuvant radiation therapy because of her favorable profile and her age.   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 She does not wish to pursue radiation.  She is willing to try tamoxifen at 10 mg a day. She will start this in a couple of weeks after the drain has come out.  Telephone visit in 2 months to discuss tolerance to tamoxifen.

## 2023-07-10 ENCOUNTER — Encounter: Payer: Self-pay | Admitting: *Deleted

## 2023-07-11 ENCOUNTER — Ambulatory Visit: Payer: Medicare PPO | Admitting: Rehabilitation

## 2023-07-30 ENCOUNTER — Other Ambulatory Visit: Payer: Self-pay | Admitting: Cardiology

## 2023-07-30 DIAGNOSIS — I10 Essential (primary) hypertension: Secondary | ICD-10-CM

## 2023-07-30 DIAGNOSIS — R002 Palpitations: Secondary | ICD-10-CM

## 2023-07-30 DIAGNOSIS — Z9221 Personal history of antineoplastic chemotherapy: Secondary | ICD-10-CM

## 2023-07-30 DIAGNOSIS — E782 Mixed hyperlipidemia: Secondary | ICD-10-CM

## 2023-07-30 DIAGNOSIS — I491 Atrial premature depolarization: Secondary | ICD-10-CM

## 2023-08-02 NOTE — Progress Notes (Unsigned)
Cardiology Office Note:    Date:  08/03/2023   ID:  ALLEXA TATOM, DOB [birth year], MRN [id number]  PCP:  Myrlene Broker, MD   [facility] Providers Cardiologist:  Little Ishikawa, MD {   Referring MD: Myrlene Broker, *     History of Present Illness:    Rebecca Guzman is a 89 y.o. female with a hx of asthma, GERD, HLD and palpitations who was previously followed by Dr. Delton See and Dr. Shari Prows who now presents to clinic for follow-up.  Per review of the record, the patient has been followed by Dr. Delton See for palpitations. In 2018 she underwent Holter monitoring that showed frequent PACs (700 in 34 hrs) and few very short runs of atrial tachycardia with the longest lasting 4 beats. She was started on metoprolol 12.5 mg by mouth twice a day with significant improvement of symptoms. She also had a TTE that showed LVEF 60-65% and grade 1 diastolic dysfunction, trivial MR, normal left atrial size and mild TR. her metoprolol was later increased to 25 mg p.o. twice daily with improvement of her symptoms.  The patient also had breast cancer 1992 s/p chemo and mastectomy but no radiation at that time. She was diagnosed with recurrent breast cancer 10/2017 which was very localized; she is in remission and is off tamoxifen.  No chemo or radiation at that time.  TTE with strain was obtained 01/2021 which showed LVEF 58%, G1DD, GLS -20.5%, normal RV, trivial MR, RAP 3.  Since last clinic visit, she reports she is doing well.  Reports palpitations only occurring about every 2 weeks and just last for few seconds.  She denies any chest pain.  She was having dyspnea in the spring but reports has resolved, thinks was related to her allergies.  She denies any chest pain, lightheadedness, syncope, or lower extremity edema.  She has not been walking recently due to balance issues.  Past Medical History:  Diagnosis Date   Asthma    BPPV (benign paroxysmal positional vertigo)     Breast cancer (HCC)    left mastectomy 1992, right lumpectomy 2019   Cancer (HCC)    Compression fracture of L3 lumbar vertebra 04/06/2015   Dysrhythmia 2019   pt states Cardiologist told her it is "pre A-fib"   Family history of adverse reaction to anesthesia    daughter had "throat close up" with anesthesia   Family history of breast cancer    Family history of colon cancer    Family history of pancreatic cancer    GERD (gastroesophageal reflux disease)    Hiatal hernia    pt states she doesnt have this   History of adenomatous polyp of colon    tubular adenoma 2005 and  2014 tubular adenoma and  hyperplastic polyp   History of breast cancer per pt no recurrence   dx 1992 --  s/p  left breast mastectomy and chemotherapy   Hyperlipidemia LDL goal <100    Osteoarthritis of right knee    Personal history of chemotherapy 1992   PONV (postoperative nausea and vomiting)    and "gets weak and light-leaded"   Sensation of pressure in bladder area    Vaginal vault prolapse    anterior   Wears glasses    Wears hearing aid    left only    Past Surgical History:  Procedure Laterality Date   BREAST BIOPSY Right 04/19/2018   BREAST BIOPSY Right 05/15/2023   MM RT BREAST BX  W LOC DEV 1ST LESION IMAGE BX SPEC STEREO GUIDE 05/15/2023 [facility]   BREAST LUMPECTOMY Right 05/17/2018   BREAST LUMPECTOMY WITH RADIOACTIVE SEED LOCALIZATION Right 05/17/2018   Procedure: RIGHT BREAST LUMPECTOMY WITH RADIOACTIVE SEED LOCALIZATION;  Surgeon: Almond Lint, MD;  Location: [facility];  Service: General;  Laterality: Right;   CATARACT EXTRACTION W/ INTRAOCULAR LENS  IMPLANT, BILATERAL  2014   CHOLECYSTECTOMY  1985   COLONOSCOPY  last one 06-04-2013   CYSTOCELE REPAIR N/A 02/29/2016   Procedure: ANTERIOR VAULT PROLAPSE REPAIR COLOPLAST SACROSPINUS FIXATION AUGMENTED AXIS DERMIS REPAIR  ;  Surgeon: Jethro Bolus, MD;  Location: [facility];  Service: Urology;  Laterality: N/A;    ESOPHAGOGASTRODUODENOSCOPY  08-31-2009   LAPAROSCOPIC ASSISTED VAGINAL HYSTERECTOMY  01-10-2000   w/ Bilateral Salpingoophorectomy /  Anterior & Posterior repair/  Pubovaginal Sling   MASTECTOMY Left 1992   SIMPLE MASTECTOMY WITH AXILLARY SENTINEL NODE BIOPSY Right 06/14/2023   Procedure: RIGHT MASTECTOMY WITH MAGTRACE INJECTION;  Surgeon: Almond Lint, MD;  Location: [facility];  Service: General;  Laterality: Right;  PEC BLOCK   TOTAL KNEE ARTHROPLASTY Right 05/21/2021   Procedure: RIGHT TOTAL KNEE ARTHROPLASTY;  Surgeon: Eldred Manges, MD;  Location: [facility] OR;  Service: Orthopedics;  Laterality: Right;    Current Medications: Current Meds  Medication Sig   acetaminophen (TYLENOL) 500 MG tablet Take 2 tablets (1,000 mg total) by mouth every 6 (six) hours.   azelastine (OPTIVAR) 0.05 % ophthalmic solution 1 drop 2 (two) times daily.   Calcium Carbonate-Vit D-Min (CALCIUM 600+D3 PLUS MINERALS) 600-800 MG-UNIT TABS Take 2 tablets by mouth daily.   denosumab (PROLIA) 60 MG/ML SOSY injection Inject 60 mg into the skin every 6 (six) months.   fexofenadine (ALLEGRA) 180 MG tablet Take 180 mg by mouth daily.   ipratropium (ATROVENT) 0.03 % nasal spray Place into both nostrils.   metoprolol tartrate (LOPRESSOR) 25 MG tablet TAKE 1 TABLET(25 MG) BY MOUTH TWICE DAILY   Multiple Vitamin (MULTIVITAMIN) tablet Take 1 tablet by mouth daily.   Multiple Vitamins-Minerals (PRESERVISION AREDS 2 PO) Take 1 tablet by mouth daily.   Omega-3 Fatty Acids (FISH OIL) 1000 MG CAPS Take 1 capsule (1,000 mg total) by mouth daily.   omeprazole (PRILOSEC) 40 MG capsule TAKE 1 CAPSULE(40 MG) BY MOUTH DAILY   Probiotic Product (PROBIOTIC FORMULA PO) Take 1 tablet by mouth every evening.    rosuvastatin (CRESTOR) 5 MG tablet TAKE 1 TABLET(5 MG) BY MOUTH 4 TIMES A WEEK   tamoxifen (NOLVADEX) 10 MG tablet Take 1 tablet (10 mg total) by mouth daily.   [DISCONTINUED] acetaminophen (TYLENOL) 500 MG tablet Take 1  tablet (500 mg total) by mouth 2 (two) times daily as needed.   [DISCONTINUED] gabapentin (NEURONTIN) 100 MG capsule Take 1 capsule (100 mg total) by mouth 2 (two) times daily.   [DISCONTINUED] traMADol (ULTRAM) 50 MG tablet Take 1 tablet (50 mg total) by mouth every 6 (six) hours as needed (mild pain).     Allergies:   Codeine and Morphine and codeine   Social History   Socioeconomic History   Marital status: Widowed    Spouse name: Not on file   Number of children: 4   Years of education: Not on file   Highest education level: Not on file  Occupational History   Occupation: Retired Magazine features editor: RETIRED  Tobacco Use   Smoking status: Former    Current packs/day: 0.00    Types: Cigarettes  Start date: 10/12/1970    Quit date: 10/12/1983    Years since quitting: 39.8   Smokeless tobacco: Never  Vaping Use   Vaping status: Never Used  Substance and Sexual Activity   Alcohol use: No   Drug use: No   Sexual activity: Not on file  Other Topics Concern   Not on file  Social History Narrative   Not on file   Social Drivers of Health   Financial Resource Strain: Low Risk  (01/03/2023)   Overall Financial Resource Strain (CARDIA)    Difficulty of Paying Living Expenses: Not hard at all  Food Insecurity: No Food Insecurity (05/31/2023)   Hunger Vital Sign    Worried About Running Out of Food in the Last Year: Never true    Ran Out of Food in the Last Year: Never true  Transportation Needs: No Transportation Needs (05/31/2023)   PRAPARE - Administrator, Civil Service (Medical): No    Lack of Transportation (Non-Medical): No  Physical Activity: Sufficiently Active (01/03/2023)   Exercise Vital Sign    Days of Exercise per Week: 5 days    Minutes of Exercise per Session: 30 min  Stress: No Stress Concern Present (01/03/2023)   [employer] of Occupational Health - Occupational Stress Questionnaire    Feeling of Stress : Only a little  Social  Connections: Moderately Integrated (01/03/2023)   Social Connection and Isolation Panel [NHANES]    Frequency of Communication with Friends and Family: More than three times a week    Frequency of Social Gatherings with Friends and Family: More than three times a week    Attends Religious Services: More than 4 times per year    Active Member of [employer] or Organizations: Yes    Attends Engineer, structural: More than 4 times per year    Marital Status: Divorced     Family History: The patient's family history includes Breast cancer in her sister; Colon cancer (age of onset: 63) in her mother; Heart attack in her mother; Heart disease in her father; Hypertension in her mother; Pancreatic cancer (age of onset: 13) in an other family member.  ROS:   Please see the history of present illness.      EKGs/Labs/Other Studies Reviewed:    The following studies were reviewed today: Cardiac Studies & Procedures      ECHOCARDIOGRAM  ECHOCARDIOGRAM COMPLETE 01/20/2021  Narrative ECHOCARDIOGRAM REPORT    Patient Name:   RUBI OOTEN Date of Exam: 01/20/2021 Medical Rec #:  [id number]       Height:       65.0 in Accession #:    [id number]      Weight:       171.8 lb Date of Birth:  April 25, 1934       BSA:          1.854 m Patient Age:    87 years        BP:           126/76 mmHg Patient Gender: F               HR:           73 bpm. Exam Location:  [address]  Procedure: 2D Echo, 3D Echo, Cardiac Doppler, Color Doppler and Strain Analysis  Indications:    Z92.21 H/o Chemotherapy  History:        Patient has prior history of Echocardiogram examinations, most recent 11/15/2016. Arrythmias:Palpitation; Risk Factors:Dyslipidemia and  Former Smoker. H/o breast cancer.  Sonographer:    Garald Braver, RDCS Referring Phys: [id number] Kathlynn Grate [place]  IMPRESSIONS   1. Left ventricular ejection fraction, by estimation, is 55 to 60%. Left ventricular ejection fraction by 3D volume is  58 %. The left ventricle has normal function. The left ventricle has no regional wall motion abnormalities. Left ventricular diastolic parameters are consistent with Grade I diastolic dysfunction (impaired relaxation). Elevated left ventricular end-diastolic pressure. The E/e' is 3. The average left ventricular global longitudinal strain is -20.5 %. The global longitudinal strain is normal. 2. Right ventricular systolic function is normal. The right ventricular size is normal. There is normal pulmonary artery systolic pressure. The estimated right ventricular systolic pressure is 33.5 mmHg. 3. The mitral valve is degenerative. Trivial mitral valve regurgitation. 4. The aortic valve is tricuspid. Aortic valve regurgitation is not visualized. Mild aortic valve sclerosis is present, with no evidence of aortic valve stenosis. 5. The inferior vena cava is normal in size with greater than 50% respiratory variability, suggesting right atrial pressure of 3 mmHg.  Comparison(s): 11/15/16: LVEF 60-65%, grade 1 DD, elevated LVEDP, RVSP 37 mmHg.  FINDINGS Left Ventricle: Left ventricular ejection fraction, by estimation, is 55 to 60%. Left ventricular ejection fraction by 3D volume is 58 %. The left ventricle has normal function. The left ventricle has no regional wall motion abnormalities. The average left ventricular global longitudinal strain is -20.5 %. The global longitudinal strain is normal. The left ventricular internal cavity size was normal in size. There is no left ventricular hypertrophy. Left ventricular diastolic parameters are consistent with Grade I diastolic dysfunction (impaired relaxation). Elevated left ventricular end-diastolic pressure. The E/e' is 3.  Right Ventricle: The right ventricular size is normal. No increase in right ventricular wall thickness. Right ventricular systolic function is normal. There is normal pulmonary artery systolic pressure. The tricuspid regurgitant velocity is  2.76 m/s, and with an assumed right atrial pressure of 3 mmHg, the estimated right ventricular systolic pressure is 33.5 mmHg.  Left Atrium: Left atrial size was normal in size.  Right Atrium: Right atrial size was normal in size.  Pericardium: There is no evidence of pericardial effusion.  Mitral Valve: The mitral valve is degenerative in appearance. There is mild thickening of the mitral valve leaflet(s). Trivial mitral valve regurgitation.  Tricuspid Valve: The tricuspid valve is grossly normal. Tricuspid valve regurgitation is trivial.  Aortic Valve: The aortic valve is tricuspid. Aortic valve regurgitation is not visualized. Mild aortic valve sclerosis is present, with no evidence of aortic valve stenosis.  Pulmonic Valve: The pulmonic valve was normal in structure. Pulmonic valve regurgitation is not visualized.  Aorta: The aortic root and ascending aorta are structurally normal, with no evidence of dilitation.  Venous: The inferior vena cava is normal in size with greater than 50% respiratory variability, suggesting right atrial pressure of 3 mmHg.  IAS/Shunts: No atrial level shunt detected by color flow Doppler.   LEFT VENTRICLE PLAX 2D LVIDd:         4.10 cm         Diastology LVIDs:         2.90 cm         LV e' medial:    4.00 cm/s LV PW:         1.00 cm         LV E/e' medial:  22.5 LV IVS:        1.00 cm  LV e' lateral:   7.20 cm/s LVOT diam:     2.00 cm         LV E/e' lateral: 12.5 LV SV:         61 LV SV Index:   33              2D LVOT Area:     3.14 cm        Longitudinal Strain 2D Strain GLS  -20.0 % (A2C): 2D Strain GLS  -23.2 % (A3C): 2D Strain GLS  -18.2 % (A4C): 2D Strain GLS  -20.5 % Avg:  3D Volume EF LV 3D EF:    Left ventricular ejection fraction by 3D volume is 58 %.  3D Volume EF: 3D EF:        58 % LV EDV:       99 ml LV ESV:       41 ml LV SV:        58 ml  RIGHT VENTRICLE RV Basal diam:  3.40 cm RV S prime:     8.50  cm/s TAPSE (M-mode): 1.6 cm RVSP:           33.5 mmHg  LEFT ATRIUM             Index       RIGHT ATRIUM           Index LA diam:        3.60 cm 1.94 cm/m  RA Pressure: 3.00 mmHg LA Vol (A2C):   37.0 ml 19.95 ml/m RA Area:     9.52 cm LA Vol (A4C):   46.3 ml 24.97 ml/m RA Volume:   17.30 ml  9.33 ml/m LA Biplane Vol: 42.7 ml 23.03 ml/m AORTIC VALVE LVOT Vmax:   86.20 cm/s LVOT Vmean:  57.700 cm/s LVOT VTI:    0.195 m  AORTA Ao Root diam: 3.40 cm Ao Asc diam:  3.60 cm  MITRAL VALVE               TRICUSPID VALVE TR Peak grad:   30.5 mmHg TR Vmax:        276.00 cm/s MV E velocity: 90.00 cm/s  Estimated RAP:  3.00 mmHg MV A velocity: 93.00 cm/s  RVSP:           33.5 mmHg MV E/A ratio:  0.97 SHUNTS Systemic VTI:  0.20 m Systemic Diam: 2.00 cm  Zoila Shutter MD Electronically signed by Zoila Shutter MD Signature Date/Time: 01/20/2021/11:07:05 AM    Final              EKG:  NSR-personally reveiwed  Recent Labs: 09/21/2022: ALT 17; BUN 23; Creatinine, Ser 0.86; Hemoglobin 13.7; Platelets 228.0; Potassium 4.3; Sodium 139  Recent Lipid Panel    Component Value Date/Time   CHOL 170 09/21/2022 1035   CHOL 187 01/26/2016 0802   TRIG 124.0 09/21/2022 1035   HDL 61.50 09/21/2022 1035   HDL 59 01/26/2016 0802   CHOLHDL 3 09/21/2022 1035   VLDL 24.8 09/21/2022 1035   LDLCALC 83 09/21/2022 1035   LDLCALC 76 05/08/2020 0830   LDLDIRECT 78.0 03/29/2021 1441     Physical Exam:    VS:  BP 118/72 (BP Location: Left Wrist, Patient Position: Sitting, Cuff Size: Normal)   Pulse 74   Ht 5\' 5"  (1.651 m)   Wt 172 lb (78 kg)   LMP 11/21/1985   BMI 28.62 kg/m     Wt Readings from Last 3 Encounters:  08/03/23 172 lb (78 kg)  07/05/23 169 lb 4.8 oz (76.8 kg)  06/14/23 168 lb 14 oz (76.6 kg)     GEN:  Well nourished, well developed in no acute distress HEENT: Normal NECK: No JVD CARDIAC: RRR, 1/6 systolic murmur. No rubs or gallops. RESPIRATORY:  CTAB ABDOMEN: Soft,  non-tender, non-distended MUSCULOSKELETAL:  No edema; No deformity  SKIN: Warm and dry NEUROLOGIC:  Alert and oriented x 3 PSYCHIATRIC:  Normal affect   ASSESSMENT:    1. Palpitations   2. Mixed hyperlipidemia   3. Essential hypertension   4. PAC (premature atrial contraction)      PLAN:    In order of problems listed above:  #Palpitations: #PACs: Holter monitoring in 2018 that showed frequent PACs (700 in 34 hrs) and few very short runs of atrial tachycardia with the longest lasting 4 beats. TTE with LVEF 60-65%, grade 1 diastolic dysfunction, trivial MR, normal left atrial size and mild TR. Improved on metop. -Continue metop 25mg  BID  #SOB: -Suspect this is related to allergies as symptoms improves with allegra and nasal spray.  Reports had symptoms in the spring but resolved  #HTN: Well controlled and at goal. -Continue metop 25mg  BID  #HLD: -LDL well controlled at 83 -Tolerating crestor 5mg  4x/week  #Recurrent breast cancer: Had initial breast cancer in 1990s s/p mastectomy and chemo. No XRT. Had recurrent breast cancer in 2019. No s/p Right lumpectomy: IDC, 5.5 mm, grade 1, margins negative, ER 100%, PR 100%, HER-2 1+ by IHC, Ki-67 less than 1%, T1BNX stage Ia. Previously on tamoxifen, now off and in remission. -Management per Oncology  RTC in 1 year    Medication Adjustments/Labs and Tests Ordered: Current medicines are reviewed at length with the patient today.  Concerns regarding medicines are outlined above.  No orders of the defined types were placed in this encounter.  No orders of the defined types were placed in this encounter.   Patient Instructions  Medication Instructions:  Your physician recommends that you continue on your current medications as directed. Please refer to the Current Medication list given to you today.  *If you need a refill on your cardiac medications before your next appointment, please call your pharmacy*   Follow-Up: At  [facility], you and your health needs are our priority.  As part of our continuing mission to provide you with exceptional heart care, we have created designated Provider Care Teams.  These Care Teams include your primary Cardiologist (physician) and Advanced Practice Providers (APPs -  Physician Assistants and Nurse Practitioners) who all work together to provide you with the care you need, when you need it.  We recommend signing up for the patient portal called "MyChart".  Sign up information is provided on this After Visit Summary.  MyChart is used to connect with patients for Virtual Visits (Telemedicine).  Patients are able to view lab/test results, encounter notes, upcoming appointments, etc.  Non-urgent messages can be sent to your provider as well.   To learn more about what you can do with MyChart, go to [email or web address].    Your next appointment:   1 year(s)  Provider:   Epifanio Lesches, MD           Signed, Little Ishikawa, MD  08/03/2023 1:41 PM    [facility]

## 2023-08-03 ENCOUNTER — Ambulatory Visit: Payer: Medicare PPO | Attending: Cardiology | Admitting: Cardiology

## 2023-08-03 ENCOUNTER — Encounter: Payer: Self-pay | Admitting: Cardiology

## 2023-08-03 VITALS — BP 118/72 | HR 74 | Ht 65.0 in | Wt 172.0 lb

## 2023-08-03 DIAGNOSIS — E782 Mixed hyperlipidemia: Secondary | ICD-10-CM

## 2023-08-03 DIAGNOSIS — R002 Palpitations: Secondary | ICD-10-CM | POA: Diagnosis not present

## 2023-08-03 DIAGNOSIS — I10 Essential (primary) hypertension: Secondary | ICD-10-CM | POA: Diagnosis not present

## 2023-08-03 DIAGNOSIS — I491 Atrial premature depolarization: Secondary | ICD-10-CM | POA: Diagnosis not present

## 2023-08-03 NOTE — Patient Instructions (Signed)
Medication Instructions:  Your physician recommends that you continue on your current medications as directed. Please refer to the Current Medication list given to you today.  *If you need a refill on your cardiac medications before your next appointment, please call your pharmacy*   Follow-Up: At [facility], you and your health needs are our priority.  As part of our continuing mission to provide you with exceptional heart care, we have created designated Provider Care Teams.  These Care Teams include your primary Cardiologist (physician) and Advanced Practice Providers (APPs -  Physician Assistants and Nurse Practitioners) who all work together to provide you with the care you need, when you need it.  We recommend signing up for the patient portal called "MyChart".  Sign up information is provided on this After Visit Summary.  MyChart is used to connect with patients for Virtual Visits (Telemedicine).  Patients are able to view lab/test results, encounter notes, upcoming appointments, etc.  Non-urgent messages can be sent to your provider as well.   To learn more about what you can do with MyChart, go to [email or web address].    Your next appointment:   1 year(s)  Provider:   Epifanio Lesches, MD

## 2023-08-06 NOTE — Therapy (Signed)
OUTPATIENT PHYSICAL THERAPY BREAST CANCER POST OP FOLLOW UP   Patient Name: Rebecca Guzman MRN: [id number] DOB:07-13-1934, 89 y.o., female Today's Date: 08/07/2023  END OF SESSION:  PT End of Session - 08/07/23 1446     Visit Number 2    Number of Visits 6    Date for PT Re-Evaluation 09/11/23    Authorization Type Auth needed    PT Start Time 1400    PT Stop Time 1445    PT Time Calculation (min) 45 min    Activity Tolerance Patient tolerated treatment well    Behavior During Therapy [facility] for tasks assessed/performed             Past Medical History:  Diagnosis Date   Asthma    BPPV (benign paroxysmal positional vertigo)    Breast cancer (HCC)    left mastectomy 1992, right lumpectomy 2019   Cancer (HCC)    Compression fracture of L3 lumbar vertebra 04/06/2015   Dysrhythmia 2019   pt states Cardiologist told her it is "pre A-fib"   Family history of adverse reaction to anesthesia    daughter had "throat close up" with anesthesia   Family history of breast cancer    Family history of colon cancer    Family history of pancreatic cancer    GERD (gastroesophageal reflux disease)    Hiatal hernia    pt states she doesnt have this   History of adenomatous polyp of colon    tubular adenoma 2005 and  2014 tubular adenoma and  hyperplastic polyp   History of breast cancer per pt no recurrence   dx 1992 --  s/p  left breast mastectomy and chemotherapy   Hyperlipidemia LDL goal <100    Osteoarthritis of right knee    Personal history of chemotherapy 1992   PONV (postoperative nausea and vomiting)    and "gets weak and light-leaded"   Sensation of pressure in bladder area    Vaginal vault prolapse    anterior   Wears glasses    Wears hearing aid    left only   Past Surgical History:  Procedure Laterality Date   BREAST BIOPSY Right 04/19/2018   BREAST BIOPSY Right 05/15/2023   MM RT BREAST BX W LOC DEV 1ST LESION IMAGE BX SPEC STEREO GUIDE 05/15/2023 [facility]  MAMMOGRAPHY   BREAST LUMPECTOMY Right 05/17/2018   BREAST LUMPECTOMY WITH RADIOACTIVE SEED LOCALIZATION Right 05/17/2018   Procedure: RIGHT BREAST LUMPECTOMY WITH RADIOACTIVE SEED LOCALIZATION;  Surgeon: Almond Lint, MD;  Location: [facility];  Service: General;  Laterality: Right;   CATARACT EXTRACTION W/ INTRAOCULAR LENS  IMPLANT, BILATERAL  2014   CHOLECYSTECTOMY  1985   COLONOSCOPY  last one 06-04-2013   CYSTOCELE REPAIR N/A 02/29/2016   Procedure: ANTERIOR VAULT PROLAPSE REPAIR COLOPLAST SACROSPINUS FIXATION AUGMENTED AXIS DERMIS REPAIR  ;  Surgeon: Jethro Bolus, MD;  Location: [facility];  Service: Urology;  Laterality: N/A;   ESOPHAGOGASTRODUODENOSCOPY  08-31-2009   LAPAROSCOPIC ASSISTED VAGINAL HYSTERECTOMY  01-10-2000   w/ Bilateral Salpingoophorectomy /  Anterior & Posterior repair/  Pubovaginal Sling   MASTECTOMY Left 1992   SIMPLE MASTECTOMY WITH AXILLARY SENTINEL NODE BIOPSY Right 06/14/2023   Procedure: RIGHT MASTECTOMY WITH MAGTRACE INJECTION;  Surgeon: Almond Lint, MD;  Location: [facility];  Service: General;  Laterality: Right;  PEC BLOCK   TOTAL KNEE ARTHROPLASTY Right 05/21/2021   Procedure: RIGHT TOTAL KNEE ARTHROPLASTY;  Surgeon: Eldred Manges, MD;  Location: [facility] OR;  Service: Orthopedics;  Laterality: Right;   Patient Active Problem List   Diagnosis Date Noted   Breast cancer of upper-inner quadrant of right female breast (HCC) 06/14/2023   Anemia 09/21/2021   Routine general medical examination at a health care facility 09/21/2021   Hx of total knee arthroplasty, right 05/21/2021   Genetic testing 06/07/2018   History of breast cancer 04/19/2018   Wears hearing aid    Wears glasses    Vaginal vault prolapse    Hiatal hernia    BPPV (benign paroxysmal positional vertigo)    Palpitations 10/31/2016   Radicular pain of right lower back 04/06/2015   Compression fracture of L3 vertebra (HCC) 04/06/2015   Senile osteoporosis  04/06/2015   DDD (degenerative disc disease), lumbar 04/06/2015   Fatigue 01/17/2014   Hx of adenomatous colonic polyps 07/11/2013   Hyperlipidemia LDL goal <100    Bladder prolapse, female, acquired    Esophageal reflux 10/12/2011    PCP: Dr. Hillard Danker  REFERRING PROVIDER: Dr. Almond Lint   REFERRING DIAG:  Diagnosis  C50.211 (ICD-10-CM) - Malignant neoplasm of upper-inner quadrant of right female breast    THERAPY DIAG:  History of breast cancer  Intraductal carcinoma in situ of right breast  Abnormal posture  Aftercare following surgery for neoplasm  At risk for lymphedema  Rationale for Evaluation and Treatment: Rehabilitation  ONSET DATE: 05/28/23  SUBJECTIVE:                                                                                                                                                                                           SUBJECTIVE STATEMENT: I am just having trouble with the fluid but I can do everything I need to do  PERTINENT HISTORY:  Hx of Lt breast cancer with mastectomy in 1992. Rt lumpectomy 05/17/2018. New diagnosis of Rt DCIS. Pt had a mastectomy 06/14/23 with 1 negative node in the axilla and 2/3 nodes positive in the breast tissue. Declining radiation. Will try tamoxifen again but did not tolerate it well in the past. Other hx: LBP, scoliosis, herniated disc.   PATIENT GOALS:  Reassess how my recovery is going related to arm function, pain, and swelling.  PAIN:  Are you having pain? No just the normal OA  PRECAUTIONS: Recent Surgery  RED FLAGS: None   ACTIVITY LEVEL / LEISURE: I am back to normal.     OBJECTIVE:   OBSERVATIONS/PALPATION: Wearing a normal bra now with soft inserts.  Well healed incision, lateral trunk edema and a small nodule which pt states was egg sized around the pectoralis border of the axilla which is firm.  Possible cording in the axilla vs tightness.   POSTURE:  Rounded shoulders and  forward head   LYMPHEDEMA ASSESSMENT:   UPPER EXTREMITY AROM/PROM:   A/PROM RIGHT   eval -all have unchanging pain 08/07/23  Shoulder extension 50 60  Shoulder flexion 128  128 pull  Shoulder abduction 164  150 - pull  Shoulder internal rotation     Shoulder external rotation 80 80                          (Blank rows = not tested)   A/PROM LEFT   eval  Shoulder extension 58  Shoulder flexion 145  Shoulder abduction 160  Shoulder internal rotation    Shoulder external rotation 70                          (Blank rows = not tested)    LYMPHEDEMA ASSESSMENTS:    LANDMARK LEFT eval  At axilla     15 cm proximal to olecranon process 32.7  10 cm proximal to olecranon process 31.2  Olecranon process 30  15 cm proximal to ulnar styloid process 25.2  10 cm proximal to ulnar styloid process 22  Just proximal to ulnar styloid process 18  Across hand at thumb web space 21  At base of 2nd digit 6.5  (Blank rows = not tested)   LANDMARK RIGHT eval  At axilla     15 cm proximal to olecranon process 32.7  10 cm proximal to olecranon process 31.1  Olecranon process 28.5  15 cm proximal to ulnar styloid process 24.5  10 cm proximal to ulnar styloid process 20.3  Just proximal to ulnar styloid process 17.8  Across hand at thumb web space 20.8  At base of 2nd digit 6.7  (Blank rows = not tested)   QUICK DASH SURVEY: EVAL: 18% limited due to OA , 9% on re-eval  LDEX: 5.5 as baseline after surgery  TODAY"S TREATMENT Post-op reeval performed which included instruction on scar massage, compression in axilla, self axillary circles, and continuing stretches.    PATIENT EDUCATION:  Education details: per today's note Person educated: Patient Education method: Programmer, multimedia, Facilities manager, Verbal cues, and Handouts Education comprehension: verbalized understanding, verbal cues required, and needs further education  HOME EXERCISE PROGRAM: Reviewed previously given post op  HEP.   ASSESSMENT:  CLINICAL IMPRESSION: Pt returns to PT after Rt mastectomy.  She ended up having lymph nodes removed because she stated Dr. Donell Beers did not like how they looked when in surgery.  She had 1 neg axillary node and 2/3 positive nodes from the breast tissue per pt.  She declines radiation and will continue with just tamoxifen.  Added LDEX and circumference measures due to having nodes out as this was not expected.  Pt is lacking around 10 deg of abduction but feels like she is moving around well overall.  She may have some mild cording.  She has a seroma? Or swelling nodule in the axilla which she reports was like a golf ball but is now better.  We will do a few sessions of PT to decrease Rt edema and improve the last bit of ROM  Pt will benefit from skilled therapeutic intervention to improve on the following deficits: Decreased knowledge of precautions, impaired UE functional use, pain, decreased ROM, postural dysfunction.   PT treatment/interventions: ADL/Self care home management, [phone]- PT Re-evaluation, 97110-Therapeutic exercises, 97530- Therapeutic activity, 97535-  Self Care, [id number]- Manual therapy, and Patient/Family education   GOALS: Goals reviewed with patient? Yes  LONG TERM GOALS:  (STG=LTG)  GOALS Name Target Date  Goal status  1 Pt will demonstrate she has regained full shoulder ROM and function post operatively compared to baselines.  Baseline: 09/11/23 INITIAL  2 Pt will improved Rt lateral trunk and axillary puffiness by at least 50% 09/11/23 INITIAL               PLAN:  PT FREQUENCY/DURATION: 1x per week x 4 weeks   PLAN FOR NEXT SESSION: Rt shoulder PROM/AAROM (shoulder OA caution), Rt trunk and axillary MLD - no nodes in Left axilla from prior cancer.  General lymphatic exercise.    [facility]  [address]  [place] [address]  [phone]  After Breast Cancer Class It is recommended you attend the ABC class  to be educated on lymphedema risk reduction. This class is free of charge and lasts for 1 hour. It is a 1-time class. You will need to download the TEAMS app either on your phone or computer. We will send you a link the night before or the morning of the class. You should be able to click on that link to join the class. This is not a confidential class. You don't have to turn your camera on, but other participants may be able to see your email address.  Scar massage You can begin gentle scar massage to you incision sites. Gently place one hand on the incision and move the skin (without sliding on the skin) in various directions. Do this for a few minutes and then you can gently massage either coconut oil or vitamin E cream into the scars.  Compression garment You should continue wearing your compression bra until you feel like you no longer have swelling.  Home exercise Program Continue doing the exercises you were given until you feel like you can do them without feeling any tightness at the end.   Walking Program Studies show that 30 minutes of walking per day (fast enough to elevate your heart rate) can significantly reduce the risk of a cancer recurrence. If you can't walk due to other medical reasons, we encourage you to find another activity you could do (like a stationary bike or water exercise).  Posture After breast cancer surgery, people frequently sit with rounded shoulders posture because it puts their incisions on slack and feels better. If you sit like this and scar tissue forms in that position, you can become very tight and have pain sitting or standing with good posture. Try to be aware of your posture and sit and stand up tall to heal properly.  Follow up PT: It is recommended you return every 3 months for the first 3 years following surgery to be assessed on the SOZO machine for an L-Dex score. This helps prevent clinically significant lymphedema in 95% of patients. These follow  up screens are 10 minute appointments that you are not billed for.  Idamae Lusher, PT 08/07/2023, 2:53 PM

## 2023-08-07 ENCOUNTER — Encounter: Payer: Self-pay | Admitting: Rehabilitation

## 2023-08-07 ENCOUNTER — Ambulatory Visit: Payer: Medicare PPO | Attending: General Surgery | Admitting: Rehabilitation

## 2023-08-07 DIAGNOSIS — Z853 Personal history of malignant neoplasm of breast: Secondary | ICD-10-CM

## 2023-08-07 DIAGNOSIS — D0511 Intraductal carcinoma in situ of right breast: Secondary | ICD-10-CM | POA: Diagnosis not present

## 2023-08-07 DIAGNOSIS — Z483 Aftercare following surgery for neoplasm: Secondary | ICD-10-CM

## 2023-08-07 DIAGNOSIS — Z9189 Other specified personal risk factors, not elsewhere classified: Secondary | ICD-10-CM

## 2023-08-07 DIAGNOSIS — R293 Abnormal posture: Secondary | ICD-10-CM | POA: Diagnosis not present

## 2023-08-14 DIAGNOSIS — N8182 Incompetence or weakening of pubocervical tissue: Secondary | ICD-10-CM | POA: Diagnosis not present

## 2023-08-16 ENCOUNTER — Encounter: Payer: Self-pay | Admitting: Rehabilitation

## 2023-08-16 ENCOUNTER — Ambulatory Visit: Payer: Medicare PPO | Attending: General Surgery | Admitting: Rehabilitation

## 2023-08-16 DIAGNOSIS — D0511 Intraductal carcinoma in situ of right breast: Secondary | ICD-10-CM | POA: Insufficient documentation

## 2023-08-16 DIAGNOSIS — Z483 Aftercare following surgery for neoplasm: Secondary | ICD-10-CM | POA: Insufficient documentation

## 2023-08-16 DIAGNOSIS — R293 Abnormal posture: Secondary | ICD-10-CM | POA: Insufficient documentation

## 2023-08-16 DIAGNOSIS — Z9189 Other specified personal risk factors, not elsewhere classified: Secondary | ICD-10-CM | POA: Insufficient documentation

## 2023-08-16 DIAGNOSIS — Z853 Personal history of malignant neoplasm of breast: Secondary | ICD-10-CM | POA: Diagnosis not present

## 2023-08-16 NOTE — Therapy (Signed)
 OUTPATIENT PHYSICAL THERAPY BREAST CANCER TREATMENT   Patient Name: Rebecca Guzman MRN: [id number] DOB:06-24-1934, 89 y.o., female Today's Date: 08/16/2023  END OF SESSION:  PT End of Session - 08/16/23 1251     Visit Number 3    Number of Visits 6    Date for PT Re-Evaluation 09/11/23    Authorization Type 1 Re-eval and 5 visits 08/07/23-09/11/23 (6 total)    Authorization - Visit Number 2    Authorization - Number of Visits 6    PT Start Time 1200    PT Stop Time 1245    PT Time Calculation (min) 45 min    Activity Tolerance Patient tolerated treatment well    Behavior During Therapy WFL for tasks assessed/performed              Past Medical History:  Diagnosis Date   Asthma    BPPV (benign paroxysmal positional vertigo)    Breast cancer (HCC)    left mastectomy 1992, right lumpectomy 2019   Cancer (HCC)    Compression fracture of L3 lumbar vertebra 04/06/2015   Dysrhythmia 2019   pt states Cardiologist told her it is pre A-fib   Family history of adverse reaction to anesthesia    daughter had throat close up with anesthesia   Family history of breast cancer    Family history of colon cancer    Family history of pancreatic cancer    GERD (gastroesophageal reflux disease)    Hiatal hernia    pt states she doesnt have this   History of adenomatous polyp of colon    tubular adenoma 2005 and  2014 tubular adenoma and  hyperplastic polyp   History of breast cancer per pt no recurrence   dx 1992 --  s/p  left breast mastectomy and chemotherapy   Hyperlipidemia LDL goal <100    Osteoarthritis of right knee    Personal history of chemotherapy 1992   PONV (postoperative nausea and vomiting)    and gets weak and light-leaded   Sensation of pressure in bladder area    Vaginal vault prolapse    anterior   Wears glasses    Wears hearing aid    left only   Past Surgical History:  Procedure Laterality Date   BREAST BIOPSY Right 04/19/2018   BREAST BIOPSY Right  05/15/2023   MM RT BREAST BX W LOC DEV 1ST LESION IMAGE BX SPEC STEREO GUIDE 05/15/2023 [facility] MAMMOGRAPHY   BREAST LUMPECTOMY Right 05/17/2018   BREAST LUMPECTOMY WITH RADIOACTIVE SEED LOCALIZATION Right 05/17/2018   Procedure: RIGHT BREAST LUMPECTOMY WITH RADIOACTIVE SEED LOCALIZATION;  Surgeon: Aron Shoulders, MD;  Location: [facility];  Service: General;  Laterality: Right;   CATARACT EXTRACTION W/ INTRAOCULAR LENS  IMPLANT, BILATERAL  2014   CHOLECYSTECTOMY  1985   COLONOSCOPY  last one 06-04-2013   CYSTOCELE REPAIR N/A 02/29/2016   Procedure: ANTERIOR VAULT PROLAPSE REPAIR COLOPLAST SACROSPINUS FIXATION AUGMENTED AXIS DERMIS REPAIR  ;  Surgeon: Arlena Gal, MD;  Location[facility];  Service: Urology;  Laterality: N/A;   ESOPHAGOGASTRODUODENOSCOPY  08-31-2009   LAPAROSCOPIC ASSISTED VAGINAL HYSTERECTOMY  01-10-2000   w/ Bilateral Salpingoophorectomy /  Anterior & Posterior repair/  Pubovaginal Sling   MASTECTOMY Left 1992   SIMPLE MASTECTOMY WITH AXILLARY SENTINEL NODE BIOPSY Right 06/14/2023   Procedure: RIGHT MASTECTOMY WITH MAGTRACE INJECTION;  Surgeon: Aron Shoulders, MD;  [facility];  Service: General;  Laterality: Right;  PEC BLOCK   TOTAL KNEE  ARTHROPLASTY Right 05/21/2021   Procedure: RIGHT TOTAL KNEE ARTHROPLASTY;  Surgeon: Barbarann Oneil BROCKS, MD;  Location: [facility] OR;  Service: Orthopedics;  Laterality: Right;   Patient Active Problem List   Diagnosis Date Noted   Breast cancer of upper-inner quadrant of right female breast (HCC) 06/14/2023   Anemia 09/21/2021   Routine general medical examination at a health care facility 09/21/2021   Hx of total knee arthroplasty, right 05/21/2021   Genetic testing 06/07/2018   History of breast cancer 04/19/2018   Wears hearing aid    Wears glasses    Vaginal vault prolapse    Hiatal hernia    BPPV (benign paroxysmal positional vertigo)    Palpitations 10/31/2016   Radicular pain of right lower back  04/06/2015   Compression fracture of L3 vertebra (HCC) 04/06/2015   Senile osteoporosis 04/06/2015   DDD (degenerative disc disease), lumbar 04/06/2015   Fatigue 01/17/2014   Hx of adenomatous colonic polyps 07/11/2013   Hyperlipidemia LDL goal <100    Bladder prolapse, female, acquired    Esophageal reflux 10/12/2011    PCP: Dr. Almarie Cleveland  REFERRING PROVIDER: Dr. Jina Nephew   REFERRING DIAG:  Diagnosis  C50.211 (ICD-10-CM) - Malignant neoplasm of upper-inner quadrant of right female breast    THERAPY DIAG:  History of breast cancer  Intraductal carcinoma in situ of right breast  Abnormal posture  Aftercare following surgery for neoplasm  At risk for lymphedema  Rationale for Evaluation and Treatment: Rehabilitation  ONSET DATE: 05/28/23  SUBJECTIVE:                                                                                                                                                                                           SUBJECTIVE STATEMENT: I am doing really well.    PERTINENT HISTORY:  Hx of Lt breast cancer with mastectomy in 1992. Rt lumpectomy 05/17/2018. New diagnosis of Rt DCIS. Pt had a mastectomy 06/14/23 with 1 negative node in the axilla and 2/3 nodes positive in the breast tissue. Declining radiation. Will try tamoxifen  again but did not tolerate it well in the past. Other hx: LBP, scoliosis, herniated disc.   PATIENT GOALS:  Reassess how my recovery is going related to arm function, pain, and swelling.  PAIN:  Are you having pain? No just the normal OA  PRECAUTIONS: Recent Surgery  RED FLAGS: None   ACTIVITY LEVEL / LEISURE: I am back to normal.     OBJECTIVE:   OBSERVATIONS/PALPATION: Wearing a normal bra now with soft inserts.  Well healed incision, lateral trunk edema and a small nodule which pt states was egg sized  around the pectoralis border of the axilla which is firm.  Possible cording in the axilla vs tightness.    POSTURE:  Rounded shoulders and forward head   LYMPHEDEMA ASSESSMENT:   UPPER EXTREMITY AROM/PROM:   A/PROM RIGHT   eval -all have unchanging pain 08/07/23 08/16/23  Shoulder extension 50 60   Shoulder flexion 128  128 pull 128  Shoulder abduction 164  150 - pull 155 top of shoulder pain  Shoulder internal rotation      Shoulder external rotation 80 80                           (Blank rows = not tested)   A/PROM LEFT   eval 08/16/23  Shoulder extension 58   Shoulder flexion 145   Shoulder abduction 160 158  Shoulder internal rotation     Shoulder external rotation 70                           (Blank rows = not tested)    LYMPHEDEMA ASSESSMENTS:    LANDMARK LEFT eval  At axilla     15 cm proximal to olecranon process 32.7  10 cm proximal to olecranon process 31.2  Olecranon process 30  15 cm proximal to ulnar styloid process 25.2  10 cm proximal to ulnar styloid process 22  Just proximal to ulnar styloid process 18  Across hand at thumb web space 21  At base of 2nd digit 6.5  (Blank rows = not tested)   LANDMARK RIGHT eval  At axilla     15 cm proximal to olecranon process 32.7  10 cm proximal to olecranon process 31.1  Olecranon process 28.5  15 cm proximal to ulnar styloid process 24.5  10 cm proximal to ulnar styloid process 20.3  Just proximal to ulnar styloid process 17.8  Across hand at thumb web space 20.8  At base of 2nd digit 6.7  (Blank rows = not tested)   QUICK DASH SURVEY: EVAL: 18% limited due to OA , 9% on re-eval  LDEX: 5.5 as baseline after surgery  TODAYS TREATMENT 08/16/23: In supine: Short neck, superficial and deep abdominals, bil shoulder collectors, Sternal nodes, R inguinal nodes and establishment of axilloinguinal pathway, then Rt trunk moving fluid towards pathways.  PROM into flexion limited by shoulder pain  EVAL: Post-op reeval performed which included instruction on scar massage, compression in axilla, self axillary circles,  and continuing stretches.    PATIENT EDUCATION:  Education details: per today's note Person educated: Patient Education method: Programmer, Multimedia, Facilities Manager, Verbal cues, and Handouts Education comprehension: verbalized understanding, verbal cues required, and needs further education  HOME EXERCISE PROGRAM: Reviewed previously given post op HEP.   ASSESSMENT:  CLINICAL IMPRESSION: Pt is showing improvements in AROM and now feeling it mainly in the shoulder OA vs pull in the chest.  She also reports improvements in the edema.    Pt will benefit from skilled therapeutic intervention to improve on the following deficits: Decreased knowledge of precautions, impaired UE functional use, pain, decreased ROM, postural dysfunction.   PT treatment/interventions: ADL/Self care home management, [phone]- PT Re-evaluation, 97110-Therapeutic exercises, 97530- Therapeutic activity, 97535- Self Care, [id number]- Manual therapy, and Patient/Family education   GOALS: Goals reviewed with patient? Yes  LONG TERM GOALS:  (STG=LTG)  GOALS Name Target Date  Goal status  1 Pt will demonstrate she has regained full shoulder ROM and function post  operatively compared to baselines.  Baseline: 09/11/23 INITIAL  2 Pt will improved Rt lateral trunk and axillary puffiness by at least 50% 09/11/23 INITIAL               PLAN:  PT FREQUENCY/DURATION: 1x per week x 4 weeks   PLAN FOR NEXT SESSION: Rt shoulder PROM/AAROM (shoulder OA caution), Rt trunk and axillary MLD - no nodes in Left axilla from prior cancer.  General lymphatic exercise.    [facility]  [address]  [place] [address]  [phone]  After Breast Cancer Class It is recommended you attend the ABC class to be educated on lymphedema risk reduction. This class is free of charge and lasts for 1 hour. It is a 1-time class. You will need to download the TEAMS app either on your phone or computer. We will send you a  link the night before or the morning of the class. You should be able to click on that link to join the class. This is not a confidential class. You don't have to turn your camera on, but other participants may be able to see your email address.  Scar massage You can begin gentle scar massage to you incision sites. Gently place one hand on the incision and move the skin (without sliding on the skin) in various directions. Do this for a few minutes and then you can gently massage either coconut oil or vitamin E cream into the scars.  Compression garment You should continue wearing your compression bra until you feel like you no longer have swelling.  Home exercise Program Continue doing the exercises you were given until you feel like you can do them without feeling any tightness at the end.   Walking Program Studies show that 30 minutes of walking per day (fast enough to elevate your heart rate) can significantly reduce the risk of a cancer recurrence. If you can't walk due to other medical reasons, we encourage you to find another activity you could do (like a stationary bike or water  exercise).  Posture After breast cancer surgery, people frequently sit with rounded shoulders posture because it puts their incisions on slack and feels better. If you sit like this and scar tissue forms in that position, you can become very tight and have pain sitting or standing with good posture. Try to be aware of your posture and sit and stand up tall to heal properly.  Follow up PT: It is recommended you return every 3 months for the first 3 years following surgery to be assessed on the SOZO machine for an L-Dex score. This helps prevent clinically significant lymphedema in 95% of patients. These follow up screens are 10 minute appointments that you are not billed for.  Larue Saddie SAUNDERS, PT 08/16/2023, 12:53 PM

## 2023-08-23 ENCOUNTER — Encounter: Payer: Self-pay | Admitting: Rehabilitation

## 2023-08-23 ENCOUNTER — Ambulatory Visit: Payer: Medicare PPO | Admitting: Rehabilitation

## 2023-08-23 DIAGNOSIS — Z9189 Other specified personal risk factors, not elsewhere classified: Secondary | ICD-10-CM | POA: Diagnosis not present

## 2023-08-23 DIAGNOSIS — D0511 Intraductal carcinoma in situ of right breast: Secondary | ICD-10-CM

## 2023-08-23 DIAGNOSIS — R293 Abnormal posture: Secondary | ICD-10-CM | POA: Diagnosis not present

## 2023-08-23 DIAGNOSIS — Z853 Personal history of malignant neoplasm of breast: Secondary | ICD-10-CM

## 2023-08-23 DIAGNOSIS — Z483 Aftercare following surgery for neoplasm: Secondary | ICD-10-CM

## 2023-08-23 NOTE — Therapy (Signed)
 OUTPATIENT PHYSICAL THERAPY BREAST CANCER TREATMENT   Patient Name: Rebecca Guzman MRN: [id number] DOB:Jan 10, 1934, 89 y.o., female Today's Date: 08/23/2023  END OF SESSION:  PT End of Session - 08/23/23 1259     Visit Number 4    Number of Visits 6    Date for PT Re-Evaluation 09/11/23    Authorization Type 1 Re-eval and 5 visits 08/07/23-09/11/23 (6 total)    Authorization - Visit Number 3    Authorization - Number of Visits 6    PT Start Time 1300    PT Stop Time 1344    PT Time Calculation (min) 44 min    Activity Tolerance Patient tolerated treatment well    Behavior During Therapy WFL for tasks assessed/performed              Past Medical History:  Diagnosis Date   Asthma    BPPV (benign paroxysmal positional vertigo)    Breast cancer (HCC)    left mastectomy 1992, right lumpectomy 2019   Cancer (HCC)    Compression fracture of L3 lumbar vertebra 04/06/2015   Dysrhythmia 2019   pt states Cardiologist told her it is "pre A-fib"   Family history of adverse reaction to anesthesia    daughter had "throat close up" with anesthesia   Family history of breast cancer    Family history of colon cancer    Family history of pancreatic cancer    GERD (gastroesophageal reflux disease)    Hiatal hernia    pt states she doesnt have this   History of adenomatous polyp of colon    tubular adenoma 2005 and  2014 tubular adenoma and  hyperplastic polyp   History of breast cancer per pt no recurrence   dx 1992 --  s/p  left breast mastectomy and chemotherapy   Hyperlipidemia LDL goal <100    Osteoarthritis of right knee    Personal history of chemotherapy 1992   PONV (postoperative nausea and vomiting)    and "gets weak and light-leaded"   Sensation of pressure in bladder area    Vaginal vault prolapse    anterior   Wears glasses    Wears hearing aid    left only   Past Surgical History:  Procedure Laterality Date   BREAST BIOPSY Right 04/19/2018   BREAST BIOPSY  Right 05/15/2023   MM RT BREAST BX W LOC DEV 1ST LESION IMAGE BX SPEC STEREO GUIDE 05/15/2023 [facility]   BREAST LUMPECTOMY Right 05/17/2018   BREAST LUMPECTOMY WITH RADIOACTIVE SEED LOCALIZATION Right 05/17/2018   Procedure: RIGHT BREAST LUMPECTOMY WITH RADIOACTIVE SEED LOCALIZATION;  Surgeon: Lockie Rima, MD;  Location: [facility] OR;  Service: General;  Laterality: Right;   CATARACT EXTRACTION W/ INTRAOCULAR LENS  IMPLANT, BILATERAL  2014   CHOLECYSTECTOMY  1985   COLONOSCOPY  last one 06-04-2013   CYSTOCELE REPAIR N/A 02/29/2016   Procedure: ANTERIOR VAULT PROLAPSE REPAIR COLOPLAST SACROSPINUS FIXATION AUGMENTED AXIS DERMIS REPAIR  ;  Surgeon: Annamarie Kid, MD;  Location: [facility];  Service: Urology;  Laterality: N/A;   ESOPHAGOGASTRODUODENOSCOPY  08-31-2009   LAPAROSCOPIC ASSISTED VAGINAL HYSTERECTOMY  01-10-2000   w/ Bilateral Salpingoophorectomy /  Anterior & Posterior repair/  Pubovaginal Sling   MASTECTOMY Left 1992   SIMPLE MASTECTOMY WITH AXILLARY SENTINEL NODE BIOPSY Right 06/14/2023   Procedure: RIGHT MASTECTOMY WITH MAGTRACE INJECTION;  Surgeon: Lockie Rima, MD;  Location: [facility];  Service: General;  Laterality: Right;  PEC BLOCK   TOTAL KNEE  ARTHROPLASTY Right 05/21/2021   Procedure: RIGHT TOTAL KNEE ARTHROPLASTY;  Surgeon: Adah Acron, MD;  Location: [facility] OR;  Service: Orthopedics;  Laterality: Right;   Patient Active Problem List   Diagnosis Date Noted   Breast cancer of upper-inner quadrant of right female breast (HCC) 06/14/2023   Anemia 09/21/2021   Routine general medical examination at a health care facility 09/21/2021   Hx of total knee arthroplasty, right 05/21/2021   Genetic testing 06/07/2018   History of breast cancer 04/19/2018   Wears hearing aid    Wears glasses    Vaginal vault prolapse    Hiatal hernia    BPPV (benign paroxysmal positional vertigo)    Palpitations 10/31/2016   Radicular pain of right lower  back 04/06/2015   Compression fracture of L3 vertebra (HCC) 04/06/2015   Senile osteoporosis 04/06/2015   DDD (degenerative disc disease), lumbar 04/06/2015   Fatigue 01/17/2014   Hx of adenomatous colonic polyps 07/11/2013   Hyperlipidemia LDL goal <100    Bladder prolapse, female, acquired    Esophageal reflux 10/12/2011    PCP: Dr. Bambi Lever  REFERRING PROVIDER: Dr. Lockie Rima   REFERRING DIAG:  Diagnosis  C50.211 (ICD-10-CM) - Malignant neoplasm of upper-inner quadrant of right female breast    THERAPY DIAG:  History of breast cancer  Intraductal carcinoma in situ of right breast  Abnormal posture  Aftercare following surgery for neoplasm  At risk for lymphedema  Rationale for Evaluation and Treatment: Rehabilitation  ONSET DATE: 05/28/23  SUBJECTIVE:                                                                                                                                                                                           SUBJECTIVE STATEMENT: I think I am sore from shoveling snow this week  PERTINENT HISTORY:  Hx of Lt breast cancer with mastectomy in 1992. Rt lumpectomy 05/17/2018. New diagnosis of Rt DCIS. Pt had a mastectomy 06/14/23 with 1 negative node in the axilla and 2/3 nodes positive in the breast tissue. Declining radiation. Will try tamoxifen  again but did not tolerate it well in the past. Other hx: LBP, scoliosis, herniated disc.   PATIENT GOALS:  Reassess how my recovery is going related to arm function, pain, and swelling.  PAIN:  Are you having pain? No just the normal OA  PRECAUTIONS: Recent Surgery  RED FLAGS: None   ACTIVITY LEVEL / LEISURE: I am back to normal.     OBJECTIVE:   OBSERVATIONS/PALPATION: Wearing a normal bra now with soft inserts.  Well healed incision, lateral trunk edema and a small nodule which pt states  was egg sized around the pectoralis border of the axilla which is firm.  Possible cording in  the axilla vs tightness.   POSTURE:  Rounded shoulders and forward head   LYMPHEDEMA ASSESSMENT:   UPPER EXTREMITY AROM/PROM:   A/PROM RIGHT   eval -all have unchanging pain 08/07/23 08/16/23 08/23/23  Shoulder extension 50 60    Shoulder flexion 128  128 pull 128 130  Shoulder abduction 164  150 - pull 155 top of shoulder pain 158  Shoulder internal rotation       Shoulder external rotation 80 80                            (Blank rows = not tested)   A/PROM LEFT   eval 08/16/23  Shoulder extension 58   Shoulder flexion 145   Shoulder abduction 160 158  Shoulder internal rotation     Shoulder external rotation 70                           (Blank rows = not tested)    LYMPHEDEMA ASSESSMENTS:    LANDMARK LEFT eval  At axilla     15 cm proximal to olecranon process 32.7  10 cm proximal to olecranon process 31.2  Olecranon process 30  15 cm proximal to ulnar styloid process 25.2  10 cm proximal to ulnar styloid process 22  Just proximal to ulnar styloid process 18  Across hand at thumb web space 21  At base of 2nd digit 6.5  (Blank rows = not tested)   LANDMARK RIGHT eval  At axilla     15 cm proximal to olecranon process 32.7  10 cm proximal to olecranon process 31.1  Olecranon process 28.5  15 cm proximal to ulnar styloid process 24.5  10 cm proximal to ulnar styloid process 20.3  Just proximal to ulnar styloid process 17.8  Across hand at thumb web space 20.8  At base of 2nd digit 6.7  (Blank rows = not tested)   QUICK DASH SURVEY: EVAL: 18% limited due to OA , 9% on re-eval  LDEX: 5.5 as baseline after surgery  TODAY"S TREATMENT 08/23/23: In supine: Short neck, superficial and deep abdominals, bil shoulder collectors, Sternal nodes, R inguinal nodes and establishment of axilloinguinal pathway, then Rt trunk moving fluid towards pathways.  PROM into flexion to tolerance only stopping before pain  08/16/23: In supine: Short neck, superficial and deep  abdominals, bil shoulder collectors, Sternal nodes, R inguinal nodes and establishment of axilloinguinal pathway, then Rt trunk moving fluid towards pathways.  PROM into flexion limited by shoulder pain  EVAL: Post-op reeval performed which included instruction on scar massage, compression in axilla, self axillary circles, and continuing stretches.    PATIENT EDUCATION:  Education details: per today's note Person educated: Patient Education method: Programmer, multimedia, Facilities manager, Verbal cues, and Handouts Education comprehension: verbalized understanding, verbal cues required, and needs further education  HOME EXERCISE PROGRAM: Reviewed previously given post op HEP.   ASSESSMENT:  CLINICAL IMPRESSION: Pt is showing improvements in AROM and now feeling it mainly in the shoulder OA vs pull in the chest.  She also reports improvements in the edema.  Would like to keep 1 more appt.   Pt will benefit from skilled therapeutic intervention to improve on the following deficits: Decreased knowledge of precautions, impaired UE functional use, pain, decreased ROM, postural dysfunction.   PT treatment/interventions: ADL/Self  care home management, [phone]- PT Re-evaluation, 97110-Therapeutic exercises, 97530- Therapeutic activity, 97535- Self Care, [id number]- Manual therapy, and Patient/Family education   GOALS: Goals reviewed with patient? Yes  LONG TERM GOALS:  (STG=LTG)  GOALS Name Target Date  Goal status  1 Pt will demonstrate she has regained full shoulder ROM and function post operatively compared to baselines.  Baseline: 09/11/23 INITIAL  2 Pt will improved Rt lateral trunk and axillary puffiness by at least 50% 09/11/23 INITIAL               PLAN:  PT FREQUENCY/DURATION: 1x per week x 4 weeks   PLAN FOR NEXT SESSION: Rt shoulder PROM/AAROM (shoulder OA caution), Rt trunk and axillary MLD - no nodes in Left axilla from prior cancer.  General lymphatic exercise.    [facility]  [address]  [place] [address]  [phone]  After Breast Cancer Class It is recommended you attend the ABC class to be educated on lymphedema risk reduction. This class is free of charge and lasts for 1 hour. It is a 1-time class. You will need to download the TEAMS app either on your phone or computer. We will send you a link the night before or the morning of the class. You should be able to click on that link to join the class. This is not a confidential class. You don't have to turn your camera on, but other participants may be able to see your email address.  Scar massage You can begin gentle scar massage to you incision sites. Gently place one hand on the incision and move the skin (without sliding on the skin) in various directions. Do this for a few minutes and then you can gently massage either coconut oil or vitamin E cream into the scars.  Compression garment You should continue wearing your compression bra until you feel like you no longer have swelling.  Home exercise Program Continue doing the exercises you were given until you feel like you can do them without feeling any tightness at the end.   Walking Program Studies show that 30 minutes of walking per day (fast enough to elevate your heart rate) can significantly reduce the risk of a cancer recurrence. If you can't walk due to other medical reasons, we encourage you to find another activity you could do (like a stationary bike or water  exercise).  Posture After breast cancer surgery, people frequently sit with rounded shoulders posture because it puts their incisions on slack and feels better. If you sit like this and scar tissue forms in that position, you can become very tight and have pain sitting or standing with good posture. Try to be aware of your posture and sit and stand up tall to heal properly.  Follow up PT: It is recommended you return every 3 months for the first 3 years following  surgery to be assessed on the SOZO machine for an L-Dex score. This helps prevent clinically significant lymphedema in 95% of patients. These follow up screens are 10 minute appointments that you are not billed for.  Encarnacion Harris, PT 08/23/2023, 1:45 PM

## 2023-08-24 DIAGNOSIS — D2272 Melanocytic nevi of left lower limb, including hip: Secondary | ICD-10-CM | POA: Diagnosis not present

## 2023-08-24 DIAGNOSIS — Z85820 Personal history of malignant melanoma of skin: Secondary | ICD-10-CM | POA: Diagnosis not present

## 2023-08-24 DIAGNOSIS — D2262 Melanocytic nevi of left upper limb, including shoulder: Secondary | ICD-10-CM | POA: Diagnosis not present

## 2023-08-24 DIAGNOSIS — L814 Other melanin hyperpigmentation: Secondary | ICD-10-CM | POA: Diagnosis not present

## 2023-08-24 DIAGNOSIS — L578 Other skin changes due to chronic exposure to nonionizing radiation: Secondary | ICD-10-CM | POA: Diagnosis not present

## 2023-08-24 DIAGNOSIS — L570 Actinic keratosis: Secondary | ICD-10-CM | POA: Diagnosis not present

## 2023-08-24 DIAGNOSIS — T1490XD Injury, unspecified, subsequent encounter: Secondary | ICD-10-CM | POA: Diagnosis not present

## 2023-08-24 DIAGNOSIS — D2271 Melanocytic nevi of right lower limb, including hip: Secondary | ICD-10-CM | POA: Diagnosis not present

## 2023-08-24 DIAGNOSIS — Z86018 Personal history of other benign neoplasm: Secondary | ICD-10-CM | POA: Diagnosis not present

## 2023-08-30 ENCOUNTER — Ambulatory Visit: Payer: Medicare PPO | Admitting: Rehabilitation

## 2023-09-04 ENCOUNTER — Inpatient Hospital Stay: Payer: Medicare PPO | Attending: Hematology and Oncology | Admitting: Hematology and Oncology

## 2023-09-04 DIAGNOSIS — Z170 Estrogen receptor positive status [ER+]: Secondary | ICD-10-CM

## 2023-09-04 DIAGNOSIS — C50411 Malignant neoplasm of upper-outer quadrant of right female breast: Secondary | ICD-10-CM

## 2023-09-04 NOTE — Progress Notes (Signed)
HEMATOLOGY-ONCOLOGY TELEPHONE VISIT PROGRESS NOTE  I connected with our patient on 09/04/23 at  8:00 AM EST by telephone and verified that I am speaking with the correct person using two identifiers.  I discussed the limitations, risks, security and privacy concerns of performing an evaluation and management service by telephone and the availability of in person appointments.  I also discussed with the patient that there may be a patient responsible charge related to this service. The patient expressed understanding and agreed to proceed.   History of Present Illness: Follow-up on tamoxifen  History of Present Illness   The patient, who started Tamoxifen in mid-December for an rec breast cancer, reports no significant side effects since initiation. She notes a slight decrease in appetite but does not consider it a major concern. She expresses a commitment to continue the medication given the results of recent tests.  She also reports a history of lymphatic issues, requiring the use of a drain for approximately four weeks. She has been undergoing physical therapy, including lymphatic massage, which she reports as beneficial. Her arm movements have improved significantly and are nearly back to pre-surgery levels. She expresses concern about which arm to use for future blood pressure measurements and needle sticks, given her medical history.        Oncology History Overview Note  PMS2 VUS on Multi-cancer panel.   Malignant neoplasm of upper-outer quadrant of right breast in female, estrogen receptor positive (HCC)  1992 Miscellaneous   Left breast cancer treated with mastectomy followed by adjuvant chemotherapy and 5 years of tamoxifen   04/17/2018 Initial Diagnosis   Screening detected right breast mass, by ultrasound measured 5 mm UIQ middle depth, biopsy revealed grade 1 IDC with DCIS, ER 100%, PR 100%, HER-2 -1+ by IHC, T1 a N0 stage I a clinical stage   05/17/2018 Surgery   Right  lumpectomy: IDC, 5.5 mm, grade 1, margins negative, ER 100%, PR 100%, HER-2 1+ by IHC, Ki-67 less than 1%, T1BNX stage Ia   05/28/2018 -  Anti-estrogen oral therapy   Letrozole daily discontinued 03/04/19, switched to anastrozole 03/23/2019, switched to tamoxifen 06/04/19   05/30/2018 Cancer Staging   Staging form: Breast, AJCC 8th Edition - Pathologic: Stage IA (pT1b, pN0, cM0, G1, ER+, PR+, HER2-) - Signed by Loa Socks, NP on 05/30/2018   06/05/2018 Genetic Testing   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  The Multi-Gene Panel offered by [facility] includes sequencing and/or deletion duplication testing of the following 84 genes: AIP, ALK, APC, ATM, AXIN2,BAP1,  BARD1, BLM, BMPR1A, BRCA1, BRCA2, BRIP1, CASR, CDC73, CDH1, CDK4, CDKN1B, CDKN1C, CDKN2A (p14ARF), CDKN2A (p16INK4a), CEBPA, CHEK2, CTNNA1, DICER1, DIS3L2, EGFR (c.2369C>T, p.Thr790Met variant only), EPCAM (Deletion/duplication testing only), FH, FLCN, GATA2, GPC3, GREM1 (Promoter region deletion/duplication testing only), HOXB13 (c.251G>A, p.Gly84Glu), HRAS, KIT, MAX, MEN1, MET, MITF (c.952G>A, p.Glu318Lys variant only), MLH1, MSH2, MSH3, MSH6, MUTYH, NBN, NF1, NF2, NTHL1, PALB2, PDGFRA, PHOX2B, PMS2, POLD1, POLE, POT1, PRKAR1A, PTCH1, PTEN, RAD50, RAD51C, RAD51D, RB1, RECQL4, RET, RUNX1, SDHAF2, SDHA (sequence changes only), SDHB, SDHC, SDHD, SMAD4, SMARCA4, SMARCB1, SMARCE1, STK11, SUFU, TERC, TERT, TMEM127, TP53, TSC1, TSC2, VHL, WRN and WT1.  The report date is 06/05/2018.     REVIEW OF SYSTEMS:   Constitutional: Denies fevers, chills or abnormal weight loss All other systems were reviewed with the patient and are negative. Observations/Objective:     Assessment Plan:  Malignant neoplasm of upper-outer quadrant of right breast in female, estrogen receptor positive (HCC) 05/17/2018: Right lumpectomy:  IDC, 5.5 mm, grade 1, margins negative, ER 100%, PR 100%, HER-2 1+ by IHC, Ki-67 less than  1%, T1BNX stage Ia History of left mastectomy 1992 We did not recommend adjuvant radiation therapy because of her favorable profile and her age.  06/14/2023: Right mastectomy: 0.7 cm grade 2 invasive moderately differentiated ductal carcinoma with focal DCIS, suspicious for angiolymphatic invasion, margins negative, 2/4 lymph nodes positive, ER 80%, PR 80%, Ki67 5%, HER2 2+ by IHC, FISH negative ratio 1.17  Did not wanted to radiation  Current treatment: Tamoxifen 10 mg daily started December 2024  Tamoxifen toxicities: Tolerating it well Slight decreased appetite  Return to clinic in 1 year for follow-up --------------------------------- Assessment and Plan    Breast Cancer Tolerating Tamoxifen well since initiation in mid-December with no significant side effects. Mild appetite suppression noted. Patient understands the importance of continued therapy given her diagnosis. -Continue Tamoxifen as prescribed.  Post-Surgical Lymphedema Successful management with lymphatic massage and physical therapy. Full range of motion nearly restored. -Continue physical therapy as needed.  Blood Pressure and Venipuncture Site Concerns about appropriate arm for blood pressure measurements and venipuncture due to history of lymphedema. -Use left arm for blood pressure measurements and venipuncture.  Follow-up Appointments Current appointments scheduled may be outdated. -Remove current appointments and schedule a 8-month follow-up. Annual follow-ups thereafter.          I discussed the assessment and treatment plan with the patient. The patient was provided an opportunity to ask questions and all were answered. The patient agreed with the plan and demonstrated an understanding of the instructions. The patient was advised to call back or seek an in-person evaluation if the symptoms worsen or if the condition fails to improve as anticipated.   I provided 20 minutes of non-face-to-face time during  this encounter.  This includes time for charting and coordination of care   Tamsen Meek, MD

## 2023-09-04 NOTE — Assessment & Plan Note (Signed)
05/17/2018: Right lumpectomy: IDC, 5.5 mm, grade 1, margins negative, ER 100%, PR 100%, HER-2 1+ by IHC, Ki-67 less than 1%, T1BNX stage Ia History of left mastectomy 1992 We did not recommend adjuvant radiation therapy because of her favorable profile and her age.  06/14/2023: Right mastectomy: 0.7 cm grade 2 invasive moderately differentiated ductal carcinoma with focal DCIS, suspicious for angiolymphatic invasion, margins negative, 2/4 lymph nodes positive, ER 80%, PR 80%, Ki67 5%, HER2 2+ by IHC, FISH negative ratio 1.17  Did not wanted to radiation  Current treatment: Tamoxifen 10 mg daily started December 2024  Tamoxifen toxicities:  Return to clinic in 1 year for follow-up

## 2023-09-06 ENCOUNTER — Encounter: Payer: Medicare PPO | Admitting: Rehabilitation

## 2023-09-11 DIAGNOSIS — C50912 Malignant neoplasm of unspecified site of left female breast: Secondary | ICD-10-CM | POA: Diagnosis not present

## 2023-09-11 DIAGNOSIS — C50911 Malignant neoplasm of unspecified site of right female breast: Secondary | ICD-10-CM | POA: Diagnosis not present

## 2023-09-20 ENCOUNTER — Ambulatory Visit
Admission: RE | Admit: 2023-09-20 | Discharge: 2023-09-20 | Disposition: A | Discharge status: Home / Self Care | Payer: Medicare PPO | Source: Ambulatory Visit | Attending: Internal Medicine | Admitting: Internal Medicine

## 2023-09-20 DIAGNOSIS — Z13820 Encounter for screening for osteoporosis: Secondary | ICD-10-CM

## 2023-09-20 DIAGNOSIS — E2839 Other primary ovarian failure: Secondary | ICD-10-CM | POA: Diagnosis not present

## 2023-09-20 DIAGNOSIS — M8588 Other specified disorders of bone density and structure, other site: Secondary | ICD-10-CM | POA: Diagnosis not present

## 2023-09-20 DIAGNOSIS — Z90722 Acquired absence of ovaries, bilateral: Secondary | ICD-10-CM | POA: Diagnosis not present

## 2023-09-20 DIAGNOSIS — N958 Other specified menopausal and perimenopausal disorders: Secondary | ICD-10-CM | POA: Diagnosis not present

## 2023-09-21 ENCOUNTER — Encounter: Payer: Self-pay | Admitting: Internal Medicine

## 2023-09-21 ENCOUNTER — Telehealth: Payer: Self-pay

## 2023-09-21 NOTE — Progress Notes (Signed)
Called patient and LVM.    1st attempt

## 2023-09-21 NOTE — Telephone Encounter (Signed)
Good afternoon ya'll!   Can we get a prior authorization started for this patient for 2025 prolia? Thanks in advance

## 2023-09-22 ENCOUNTER — Telehealth: Payer: Self-pay

## 2023-09-22 DIAGNOSIS — R8290 Unspecified abnormal findings in urine: Secondary | ICD-10-CM | POA: Diagnosis not present

## 2023-09-22 NOTE — Telephone Encounter (Signed)
Prolia VOB initiated via [email or web address]

## 2023-09-25 ENCOUNTER — Encounter: Payer: Self-pay | Admitting: Internal Medicine

## 2023-09-25 ENCOUNTER — Ambulatory Visit (INDEPENDENT_AMBULATORY_CARE_PROVIDER_SITE_OTHER): Payer: Medicare PPO | Admitting: Internal Medicine

## 2023-09-25 VITALS — BP 126/80 | HR 92 | Temp 98.0°F | Ht 65.0 in | Wt 168.0 lb

## 2023-09-25 DIAGNOSIS — M810 Age-related osteoporosis without current pathological fracture: Secondary | ICD-10-CM

## 2023-09-25 DIAGNOSIS — M51362 Other intervertebral disc degeneration, lumbar region with discogenic back pain and lower extremity pain: Secondary | ICD-10-CM | POA: Diagnosis not present

## 2023-09-25 DIAGNOSIS — R1013 Epigastric pain: Secondary | ICD-10-CM | POA: Diagnosis not present

## 2023-09-25 DIAGNOSIS — E785 Hyperlipidemia, unspecified: Secondary | ICD-10-CM | POA: Diagnosis not present

## 2023-09-25 DIAGNOSIS — Z170 Estrogen receptor positive status [ER+]: Secondary | ICD-10-CM

## 2023-09-25 DIAGNOSIS — Z0000 Encounter for general adult medical examination without abnormal findings: Secondary | ICD-10-CM | POA: Diagnosis not present

## 2023-09-25 DIAGNOSIS — R7301 Impaired fasting glucose: Secondary | ICD-10-CM | POA: Diagnosis not present

## 2023-09-25 DIAGNOSIS — K449 Diaphragmatic hernia without obstruction or gangrene: Secondary | ICD-10-CM

## 2023-09-25 DIAGNOSIS — S32030S Wedge compression fracture of third lumbar vertebra, sequela: Secondary | ICD-10-CM

## 2023-09-25 DIAGNOSIS — K219 Gastro-esophageal reflux disease without esophagitis: Secondary | ICD-10-CM | POA: Diagnosis not present

## 2023-09-25 DIAGNOSIS — R8290 Unspecified abnormal findings in urine: Secondary | ICD-10-CM | POA: Diagnosis not present

## 2023-09-25 DIAGNOSIS — C50411 Malignant neoplasm of upper-outer quadrant of right female breast: Secondary | ICD-10-CM | POA: Diagnosis not present

## 2023-09-25 LAB — CBC
HCT: 39.7 % (ref 36.0–46.0)
Hemoglobin: 13.4 g/dL (ref 12.0–15.0)
MCHC: 33.8 g/dL (ref 30.0–36.0)
MCV: 91.5 fL (ref 78.0–100.0)
Platelets: 212 10*3/uL (ref 150.0–400.0)
RBC: 4.34 Mil/uL (ref 3.87–5.11)
RDW: 14.1 % (ref 11.5–15.5)
WBC: 9.2 10*3/uL (ref 4.0–10.5)

## 2023-09-25 LAB — COMPREHENSIVE METABOLIC PANEL
ALT: 16 U/L (ref 0–35)
AST: 22 U/L (ref 0–37)
Albumin: 4.5 g/dL (ref 3.5–5.2)
Alkaline Phosphatase: 42 U/L (ref 39–117)
BUN: 22 mg/dL (ref 6–23)
CO2: 26 meq/L (ref 19–32)
Calcium: 9.2 mg/dL (ref 8.4–10.5)
Chloride: 105 meq/L (ref 96–112)
Creatinine, Ser: 0.74 mg/dL (ref 0.40–1.20)
GFR: 72.41 mL/min (ref >60.00)
Glucose, Bld: 82 mg/dL (ref 70–99)
Potassium: 3.9 meq/L (ref 3.5–5.1)
Sodium: 140 meq/L (ref 135–145)
Total Bilirubin: 0.4 mg/dL (ref 0.2–1.2)
Total Protein: 7.4 g/dL (ref 6.0–8.3)

## 2023-09-25 LAB — HM DEXA SCAN: HM Dexa Scan: -2.2

## 2023-09-25 LAB — LIPID PANEL
Cholesterol: 158 mg/dL (ref 0–200)
HDL: 62.5 mg/dL (ref >39.00)
LDL Cholesterol: 60 mg/dL (ref 0–99)
NonHDL: 95.83
Total CHOL/HDL Ratio: 3
Triglycerides: 178 mg/dL — ABNORMAL HIGH (ref 0.0–149.0)
VLDL: 35.6 mg/dL (ref 0.0–40.0)

## 2023-09-25 LAB — HEMOGLOBIN A1C: Hgb A1c MFr Bld: 5.8 % (ref 4.6–6.5)

## 2023-09-25 MED ORDER — OMEPRAZOLE 40 MG PO CPDR: DELAYED_RELEASE_CAPSULE | ORAL | 3 refills | Status: AC

## 2023-09-25 NOTE — Assessment & Plan Note (Signed)
 Checking lipid panel and adjust as needed.

## 2023-09-25 NOTE — Assessment & Plan Note (Signed)
Overall stable and gets injections as needed.

## 2023-09-25 NOTE — Assessment & Plan Note (Signed)
Flu shot up to date. Pneumonia complete. Shingrix complete. RSV counseled. Tetanus up to date. Colonoscopy aged out. Mammogram not indicated, pap smear aged out and dexa due 2027. Counseled about sun safety and mole surveillance. Counseled about the dangers of distracted driving. Given 10 year screening recommendations.

## 2023-09-25 NOTE — Assessment & Plan Note (Signed)
Checking CMP and reviewed recent bone density results with her. She will continue prolia.

## 2023-09-25 NOTE — Progress Notes (Signed)
   Subjective:   Patient ID: Rebecca Guzman, female    DOB: Apr 09, 1934, 89 y.o.   MRN: [id number]  HPI The patient is here for physical.  PMH, [facility], social history reviewed and updated  Review of Systems  Constitutional: Negative.   HENT: Negative.    Eyes: Negative.   Respiratory:  Negative for cough, chest tightness and shortness of breath.   Cardiovascular:  Negative for chest pain, palpitations and leg swelling.  Gastrointestinal:  Negative for abdominal distention, abdominal pain, constipation, diarrhea, nausea and vomiting.  Musculoskeletal:  Positive for arthralgias.  Skin: Negative.   Neurological: Negative.   Psychiatric/Behavioral: Negative.      Objective:  Physical Exam Constitutional:      Appearance: She is well-developed.  HENT:     Head: Normocephalic and atraumatic.  Cardiovascular:     Rate and Rhythm: Normal rate and regular rhythm.  Pulmonary:     Effort: Pulmonary effort is normal. No respiratory distress.     Breath sounds: Normal breath sounds. No wheezing or rales.  Abdominal:     General: Bowel sounds are normal. There is no distension.     Palpations: Abdomen is soft.     Tenderness: There is no abdominal tenderness. There is no rebound.  Musculoskeletal:     Cervical back: Normal range of motion.  Skin:    General: Skin is warm and dry.  Neurological:     Mental Status: She is alert and oriented to person, place, and time.     Coordination: Coordination abnormal.     Comments: Cane for ambulation     Vitals:   09/25/23 0956  BP: 126/80  Pulse: 92  Temp: 98 F (36.7 C)  TempSrc: Oral  SpO2: 97%  Weight: 168 lb (76.2 kg)  Height: 5\' 5"  (1.651 m)    Assessment & Plan:

## 2023-09-25 NOTE — Assessment & Plan Note (Signed)
Recent mastectomy and taking tamoxifen. Not candidate for future mammogram (bilateral mastectomy)

## 2023-09-25 NOTE — Assessment & Plan Note (Signed)
Taking omeprazole 40 mg daily and will continue lifelong. Controlling symptoms. Refilled.

## 2023-09-25 NOTE — Assessment & Plan Note (Signed)
Taking prolia and pain controlled for now.

## 2023-09-26 ENCOUNTER — Other Ambulatory Visit (HOSPITAL_COMMUNITY): Payer: Self-pay

## 2023-09-26 ENCOUNTER — Ambulatory Visit: Payer: Medicare PPO | Admitting: Orthopaedic Surgery

## 2023-09-26 ENCOUNTER — Encounter: Payer: Self-pay | Admitting: Orthopaedic Surgery

## 2023-09-26 VITALS — BP 130/79 | HR 83 | Ht 65.0 in | Wt 168.0 lb

## 2023-09-26 DIAGNOSIS — M51362 Other intervertebral disc degeneration, lumbar region with discogenic back pain and lower extremity pain: Secondary | ICD-10-CM | POA: Diagnosis not present

## 2023-09-26 NOTE — Telephone Encounter (Signed)
 Rebecca Guzman

## 2023-09-26 NOTE — Telephone Encounter (Signed)
Pharmacy Patient Advocate Encounter   Received notification from  [employer] Portal that prior authorization for Prolia is required/requested.   Insurance verification completed.   The patient is insured through [employer] .   Per test claim: PA required; PA submitted to above mentioned insurance via CoverMyMeds Key/confirmation #/EOC [id number] Status is pending

## 2023-09-27 NOTE — Progress Notes (Signed)
Office Visit Note   Patient: Rebecca Guzman           Date of Birth: 11-Oct-1933           MRN: [id number] Visit Date: 09/26/2023              Requested by: Myrlene Broker, MD [address] [place],  [place] [address] PCP: Myrlene Broker, MD   Assessment & Plan: Visit Diagnoses:  1. Degeneration of intervertebral disc of lumbar region with discogenic back pain and lower extremity pain     Plan: Would recommend continue intermittent Advil she can continue using some Tylenol.  No neurogenic claudication symptoms.  She had called about getting an MRI scan of the sinus she had past history of cancer it was recommended she be seen clinically.  If her symptoms get worse she can call and we can proceed with a new MRI scan and then order a lumbar epidural if she so desires.  Follow-Up Instructions: No follow-ups on file.   Orders:  No orders of the defined types were placed in this encounter.  No orders of the defined types were placed in this encounter.     Procedures: No procedures performed   Clinical Data: No additional findings.   Subjective: Chief Complaint  Patient presents with   multiple joint pain    HPI 89 year old female with ongoing problems with some shoulder pain hand pain some pain in her hip.  She has had injections in the past which helps.  She is amatory with a cane.  She has taken some Advil sparingly which tends to help but she takes much it bothers her stomach.  Patient has degenerative anterolisthesis at L4-5 level with prominent left lumbar curvature apex at L2-3.  Review of Systems 14 point review of systems positive history of breast cancer initially left later right previous total knee arthroplasty on the right.  History of L3 compression fracture.  All other systems noncontributory to HPI.   Objective: Vital Signs: BP 130/79   Pulse 83   Ht 5\' 5"  (1.651 m)   Wt 168 lb (76.2 kg)   LMP 11/21/1985   BMI 27.96 kg/m   Physical  Exam Constitutional:      Appearance: She is well-developed.  HENT:     Head: Normocephalic.     Right Ear: External ear normal.     Left Ear: External ear normal. There is no impacted cerumen.  Eyes:     Pupils: Pupils are equal, round, and reactive to light.  Neck:     Thyroid: No thyromegaly.     Trachea: No tracheal deviation.  Cardiovascular:     Rate and Rhythm: Normal rate.  Pulmonary:     Effort: Pulmonary effort is normal.  Abdominal:     Palpations: Abdomen is soft.  Musculoskeletal:     Cervical back: No rigidity.  Skin:    General: Skin is warm and dry.  Neurological:     Mental Status: She is alert and oriented to person, place, and time.  Psychiatric:        Behavior: Behavior normal.     Ortho Exam healed total knee arthroplasty incision.  Some trochanteric bursal tenderness mild sciatic notch tenderness.  Specialty Comments:  No specialty comments available.  Imaging: No results found.   PMFS History: Patient Active Problem List   Diagnosis Date Noted   Anemia 09/21/2021   Routine general medical examination at a health care facility 09/21/2021  Hx of total knee arthroplasty, right 05/21/2021   Genetic testing 06/07/2018   Malignant neoplasm of upper-outer quadrant of right breast in female, estrogen receptor positive (HCC) 04/19/2018   Wears hearing aid    Wears glasses    Vaginal vault prolapse    Hiatal hernia    BPPV (benign paroxysmal positional vertigo)    Palpitations 10/31/2016   Radicular pain of right lower back 04/06/2015   Compression fracture of L3 vertebra (HCC) 04/06/2015   Senile osteoporosis 04/06/2015   DDD (degenerative disc disease), lumbar 04/06/2015   Hx of adenomatous colonic polyps 07/11/2013   Hyperlipidemia LDL goal <100    Bladder prolapse, female, acquired    Past Medical History:  Diagnosis Date   Asthma    BPPV (benign paroxysmal positional vertigo)    Breast cancer (HCC)    left mastectomy 1992, right  lumpectomy 2019   Cancer (HCC)    Compression fracture of L3 lumbar vertebra 04/06/2015   Dysrhythmia 2019   pt states Cardiologist told her it is "pre A-fib"   Family history of adverse reaction to anesthesia    daughter had "throat close up" with anesthesia   Family history of breast cancer    Family history of colon cancer    Family history of pancreatic cancer    GERD (gastroesophageal reflux disease)    Hiatal hernia    pt states she doesnt have this   History of adenomatous polyp of colon    tubular adenoma 2005 and  2014 tubular adenoma and  hyperplastic polyp   History of breast cancer per pt no recurrence   dx 1992 --  s/p  left breast mastectomy and chemotherapy   Hyperlipidemia LDL goal <100    Osteoarthritis of right knee    Personal history of chemotherapy 1992   PONV (postoperative nausea and vomiting)    and "gets weak and light-leaded"   Sensation of pressure in bladder area    Vaginal vault prolapse    anterior   Wears glasses    Wears hearing aid    left only    Family History  Problem Relation Age of Onset   Heart attack Mother    Colon cancer Mother 33   Hypertension Mother    Heart disease Father    Breast cancer Sister        pat half sister dx in her 69s   Pancreatic cancer Other 18    Past Surgical History:  Procedure Laterality Date   BREAST BIOPSY Right 04/19/2018   BREAST BIOPSY Right 05/15/2023   MM RT BREAST BX W LOC DEV 1ST LESION IMAGE BX SPEC STEREO GUIDE 05/15/2023 [facility]   BREAST LUMPECTOMY Right 05/17/2018   BREAST LUMPECTOMY WITH RADIOACTIVE SEED LOCALIZATION Right 05/17/2018   Procedure: RIGHT BREAST LUMPECTOMY WITH RADIOACTIVE SEED LOCALIZATION;  Surgeon: Almond Lint, MD;  Location: [facility];  Service: General;  Laterality: Right;   CATARACT EXTRACTION W/ INTRAOCULAR LENS  IMPLANT, BILATERAL  2014   CHOLECYSTECTOMY  1985   COLONOSCOPY  last one 06-04-2013   CYSTOCELE REPAIR N/A 02/29/2016   Procedure: ANTERIOR VAULT  PROLAPSE REPAIR COLOPLAST SACROSPINUS FIXATION AUGMENTED AXIS DERMIS REPAIR  ;  Surgeon: Jethro Bolus, MD;  Location: [facility];  Service: Urology;  Laterality: N/A;   ESOPHAGOGASTRODUODENOSCOPY  08-31-2009   LAPAROSCOPIC ASSISTED VAGINAL HYSTERECTOMY  01-10-2000   w/ Bilateral Salpingoophorectomy /  Anterior & Posterior repair/  Pubovaginal Sling   MASTECTOMY Left 1992   SIMPLE MASTECTOMY WITH  AXILLARY SENTINEL NODE BIOPSY Right 06/14/2023   Procedure: RIGHT MASTECTOMY WITH MAGTRACE INJECTION;  Surgeon: Almond Lint, MD;  Location: [facility];  Service: General;  Laterality: Right;  PEC BLOCK   TOTAL KNEE ARTHROPLASTY Right 05/21/2021   Procedure: RIGHT TOTAL KNEE ARTHROPLASTY;  Surgeon: Eldred Manges, MD;  Location: [facility];  Service: Orthopedics;  Laterality: Right;   Social History   Occupational History   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

## 2023-09-28 ENCOUNTER — Other Ambulatory Visit (HOSPITAL_COMMUNITY): Payer: Self-pay

## 2023-09-28 NOTE — Telephone Encounter (Signed)
The Prolia has been approved for medical buy and bill.

## 2023-09-28 NOTE — Telephone Encounter (Signed)
Pharmacy Patient Advocate Encounter  Received notification from [facility] that Prior Authorization for Prolia has been APPROVED from 12/27/19 to 08/07/24   PA #/Case ID/Reference #: [id number]

## 2023-09-28 NOTE — Telephone Encounter (Signed)
Pt ready for scheduling for Prolia on or after : 09/28/23  Out-of-pocket cost due at time of visit: $40 (medical buy and bill)  Number of injection/visits approved: --  Primary: [employer] - Medicare Prolia co-insurance: $40 Admin fee co-insurance: --  Secondary: N/A Prolia co-insurance:  Admin fee co-insurance:   Medical Benefit Details: Date Benefits were checked: 09/22/23 Deductible: no/ Coinsurance: $40/ Admin Fee: --  Prior Auth: Approved PA# [id number]  Expiration Date: 12/27/19 to 08/07/24  # of doses approved:   Pharmacy benefit: Copay $64 If patient wants fill through the pharmacy benefit please send prescription to:  [facility] , and include estimated need by date in rx notes. Pharmacy will ship medication directly to the office.  Patient not eligible for Prolia Copay Card. Copay Card can make patient's cost as little as $25. Link to apply: [email or web address]://[email or web address]  ** This summary of benefits is an estimation of the patient's out-of-pocket cost. Exact cost may very based on individual plan coverage.

## 2023-09-29 NOTE — Telephone Encounter (Signed)
Patient has since been called and scheduled for their prolia injection. Informed of $40 OOP.

## 2023-10-09 DIAGNOSIS — N390 Urinary tract infection, site not specified: Secondary | ICD-10-CM | POA: Diagnosis not present

## 2023-10-09 DIAGNOSIS — R8290 Unspecified abnormal findings in urine: Secondary | ICD-10-CM | POA: Diagnosis not present

## 2023-10-10 DIAGNOSIS — H353131 Nonexudative age-related macular degeneration, bilateral, early dry stage: Secondary | ICD-10-CM | POA: Diagnosis not present

## 2023-10-17 ENCOUNTER — Telehealth: Payer: Self-pay

## 2023-10-17 NOTE — Telephone Encounter (Signed)
 Patient is cleared for prolia injection on 09/28/23 per PA information on 09/22/23.  Mentioned in provider note last on 09/25/23. Medication is clinic supplied. CoPay:$40

## 2023-10-18 ENCOUNTER — Ambulatory Visit: Payer: Medicare PPO

## 2023-10-18 ENCOUNTER — Other Ambulatory Visit: Payer: Self-pay

## 2023-10-18 DIAGNOSIS — M810 Age-related osteoporosis without current pathological fracture: Secondary | ICD-10-CM

## 2023-10-18 MED ORDER — DENOSUMAB 60 MG/ML ~~LOC~~ SOSY
60.0000 mg | PREFILLED_SYRINGE | Freq: Once | SUBCUTANEOUS | Status: AC
  Administered 2023-10-18: 60 mg via SUBCUTANEOUS

## 2023-10-18 MED ORDER — DENOSUMAB 60 MG/ML ~~LOC~~ SOSY
60.0000 mg | PREFILLED_SYRINGE | Freq: Once | SUBCUTANEOUS | Status: AC
  Administered 2024-04-19: 60 mg via SUBCUTANEOUS

## 2023-10-18 NOTE — Progress Notes (Signed)
 Patient visits today for their prolia injection. Patient informed of what they had received and tolerated injection well. Patient notified to reach out to office if needed.  Patient is cleared for prolia injection on 09/28/23 per PA information on 09/22/23.  Mentioned in provider note last on 09/25/23. Medication is clinic supplied. CoPay:$40

## 2023-10-19 ENCOUNTER — Encounter: Payer: Medicare PPO | Admitting: Adult Health

## 2023-11-06 ENCOUNTER — Other Ambulatory Visit: Payer: Self-pay

## 2023-11-06 DIAGNOSIS — R002 Palpitations: Secondary | ICD-10-CM

## 2023-11-06 DIAGNOSIS — E785 Hyperlipidemia, unspecified: Secondary | ICD-10-CM

## 2023-11-06 DIAGNOSIS — I493 Ventricular premature depolarization: Secondary | ICD-10-CM

## 2023-11-06 DIAGNOSIS — E782 Mixed hyperlipidemia: Secondary | ICD-10-CM

## 2023-11-06 MED ORDER — METOPROLOL TARTRATE 25 MG PO TABS: ORAL_TABLET | ORAL | 2 refills | Status: DC

## 2023-11-14 ENCOUNTER — Other Ambulatory Visit: Payer: Self-pay | Admitting: Allergy and Immunology

## 2023-11-14 ENCOUNTER — Ambulatory Visit
Admission: RE | Admit: 2023-11-14 | Discharge: 2023-11-14 | Disposition: A | Discharge status: Home / Self Care | Source: Ambulatory Visit | Attending: Allergy and Immunology | Admitting: Allergy and Immunology

## 2023-11-14 DIAGNOSIS — R062 Wheezing: Secondary | ICD-10-CM

## 2023-11-14 DIAGNOSIS — J3089 Other allergic rhinitis: Secondary | ICD-10-CM | POA: Diagnosis not present

## 2023-11-14 DIAGNOSIS — R052 Subacute cough: Secondary | ICD-10-CM | POA: Diagnosis not present

## 2023-11-14 DIAGNOSIS — K449 Diaphragmatic hernia without obstruction or gangrene: Secondary | ICD-10-CM | POA: Diagnosis not present

## 2023-11-14 DIAGNOSIS — J300 Vasomotor rhinitis: Secondary | ICD-10-CM | POA: Diagnosis not present

## 2023-11-21 DIAGNOSIS — J3089 Other allergic rhinitis: Secondary | ICD-10-CM | POA: Diagnosis not present

## 2023-11-21 DIAGNOSIS — R062 Wheezing: Secondary | ICD-10-CM | POA: Diagnosis not present

## 2023-11-21 DIAGNOSIS — J209 Acute bronchitis, unspecified: Secondary | ICD-10-CM | POA: Diagnosis not present

## 2023-11-21 DIAGNOSIS — R052 Subacute cough: Secondary | ICD-10-CM | POA: Diagnosis not present

## 2023-11-22 DIAGNOSIS — Z86018 Personal history of other benign neoplasm: Secondary | ICD-10-CM | POA: Diagnosis not present

## 2023-11-22 DIAGNOSIS — Z85820 Personal history of malignant melanoma of skin: Secondary | ICD-10-CM | POA: Diagnosis not present

## 2023-11-22 DIAGNOSIS — D2272 Melanocytic nevi of left lower limb, including hip: Secondary | ICD-10-CM | POA: Diagnosis not present

## 2023-11-22 DIAGNOSIS — D2262 Melanocytic nevi of left upper limb, including shoulder: Secondary | ICD-10-CM | POA: Diagnosis not present

## 2023-11-22 DIAGNOSIS — L814 Other melanin hyperpigmentation: Secondary | ICD-10-CM | POA: Diagnosis not present

## 2023-11-22 DIAGNOSIS — L821 Other seborrheic keratosis: Secondary | ICD-10-CM | POA: Diagnosis not present

## 2023-11-22 DIAGNOSIS — D2271 Melanocytic nevi of right lower limb, including hip: Secondary | ICD-10-CM | POA: Diagnosis not present

## 2023-11-22 DIAGNOSIS — D225 Melanocytic nevi of trunk: Secondary | ICD-10-CM | POA: Diagnosis not present

## 2023-11-22 DIAGNOSIS — D0362 Melanoma in situ of left upper limb, including shoulder: Secondary | ICD-10-CM | POA: Diagnosis not present

## 2023-11-22 DIAGNOSIS — D485 Neoplasm of uncertain behavior of skin: Secondary | ICD-10-CM | POA: Diagnosis not present

## 2023-11-22 DIAGNOSIS — L578 Other skin changes due to chronic exposure to nonionizing radiation: Secondary | ICD-10-CM | POA: Diagnosis not present

## 2023-11-30 ENCOUNTER — Ambulatory Visit: Payer: Medicare PPO | Admitting: Hematology and Oncology

## 2023-12-07 ENCOUNTER — Ambulatory Visit

## 2023-12-07 ENCOUNTER — Ambulatory Visit: Admitting: Podiatry

## 2023-12-07 ENCOUNTER — Ambulatory Visit (INDEPENDENT_AMBULATORY_CARE_PROVIDER_SITE_OTHER)

## 2023-12-07 DIAGNOSIS — M778 Other enthesopathies, not elsewhere classified: Secondary | ICD-10-CM | POA: Diagnosis not present

## 2023-12-07 DIAGNOSIS — M7752 Other enthesopathy of left foot: Secondary | ICD-10-CM

## 2023-12-07 DIAGNOSIS — M19071 Primary osteoarthritis, right ankle and foot: Secondary | ICD-10-CM

## 2023-12-07 DIAGNOSIS — M7751 Other enthesopathy of right foot: Secondary | ICD-10-CM

## 2023-12-07 NOTE — Patient Instructions (Signed)

## 2023-12-07 NOTE — Progress Notes (Signed)
 Subjective:  Patient ID: Rebecca Guzman, female    DOB: 1934-05-16,  MRN: [id number]  Chief Complaint  Patient presents with   Foot Pain    RM#11 Right foot pain ongoing for 5-6 weeks started with a cramp in the foot and has worsened no injuries.    Discussed the use of AI scribe software for clinical note transcription with the patient, who gave verbal consent to proceed.  History of Present Illness Rebecca Guzman is an 89 year old female who presents with right foot pain.  She has experienced right foot pain for five to six weeks, starting as a cramp and worsening over time. The pain is localized to the top and side of the right midfoot, sparing the heel and toes. There is no swelling, redness, or recent injury. No changes in activities or footwear have occurred. Ibuprofen and Epsom salt baths have provided some relief, and resting the foot alleviates the pain. She currently uses Tylenol  for pain management and avoids ibuprofen due to gastrointestinal concerns. She has a history of leg and foot cramps upon waking, which she attributes to prolonged positioning during sleep. Recently, she completed a course of prednisone  and received a steroid injection for allergies. There is no pain in the ankle, heel, or bottom of the foot, and no edema or erythema is present. She will be traveling to [facility] for a family service and will be out of town for the next week.      Objective:    Physical Exam General: AAO x3, NAD  Dermatological: Skin is warm, dry and supple bilateral. There are no open sores, no preulcerative lesions, no rash or signs of infection present.  Vascular: Dorsalis Pedis artery and Posterior Tibial artery pedal pulses are 2/4 bilateral with immedate capillary fill time.  There is no pain with calf compression, swelling, warmth, erythema.   Neruologic: Grossly intact via light touch bilateral.   Musculoskeletal: Tenderness to palpation on the dorsal aspect the foot along the  Lisfranc joint on the first, second metatarsal cuneiform joints.  She has some discomfort on the medial aspect the foot as well the medial musculature adjacent to the plantar fascia.  There is no area pinpoint tenderness otherwise.  Flexor, extensor tendons intact.  Hammertoes present which are asymptomatic.  Gait: Unassisted, Nonantalgic.    No images are attached to the encounter.    Results RADIOLOGY Right foot X-ray: Narrowing of Lisfranc joint, mild arthritis, no signs of stress fracture or acute fracture.  Heel spur present as well as digital contractures.  (12/06/2023)   Assessment:   1. Capsulitis of right foot   2. Arthritis of right foot      Plan:  Patient was evaluated and treated and all questions answered.  Assessment and Plan Assessment & Plan Right foot arthritis and muscle strain Right foot pain due to arthritis in the Lisfranc joint and muscle strain. X-ray shows joint space narrowing. Differential diagnosis includes stress fracture, but imaging is negative. Discussed Voltaren  gel for localized anti-inflammatory effect. Advised icing and stretching exercises. Considered steroid injection if symptoms persist, but deferred due to recent steroid use for allergies. Emphasized supportive footwear. - Advise icing the affected area for 20 minutes twice daily. - Prescribe Voltaren  gel to be applied and massaged into the affected area twice daily. - Provide a worksheet with exercises for stretching and muscle rehabilitation. - Advise wearing supportive shoes at all times, even indoors, to avoid walking barefoot. - Consider steroid injection if symptoms do  not improve with current management.  Charity Conch DPM

## 2023-12-18 DIAGNOSIS — N390 Urinary tract infection, site not specified: Secondary | ICD-10-CM | POA: Diagnosis not present

## 2023-12-20 DIAGNOSIS — C439 Malignant melanoma of skin, unspecified: Secondary | ICD-10-CM

## 2023-12-20 DIAGNOSIS — L988 Other specified disorders of the skin and subcutaneous tissue: Secondary | ICD-10-CM | POA: Diagnosis not present

## 2023-12-20 DIAGNOSIS — D0362 Melanoma in situ of left upper limb, including shoulder: Secondary | ICD-10-CM | POA: Diagnosis not present

## 2023-12-20 HISTORY — DX: Malignant melanoma of skin, unspecified: C43.9

## 2024-01-04 ENCOUNTER — Ambulatory Visit: Payer: Medicare PPO

## 2024-01-04 VITALS — Ht 65.0 in | Wt 168.0 lb

## 2024-01-04 DIAGNOSIS — Z0000 Encounter for general adult medical examination without abnormal findings: Secondary | ICD-10-CM

## 2024-01-04 NOTE — Progress Notes (Signed)
 Subjective:   Rebecca Guzman is a 89 y.o. who presents for a Medicare Wellness preventive visit.  As a reminder, Annual Wellness Visits don't include a physical exam, and some assessments may be limited, especially if this visit is performed virtually. We may recommend an in-person follow-up visit with your provider if needed.  Visit Complete: Virtual I connected with  Majel K Finck on 01/04/24 by a audio enabled telemedicine application and verified that I am speaking with the correct person using two identifiers.  Patient Location: Home  Provider Location: Office/Clinic  I discussed the limitations of evaluation and management by telemedicine. The patient expressed understanding and agreed to proceed.  Vital Signs: Because this visit was a virtual/telehealth visit, some criteria may be missing or patient reported. Any vitals not documented were not able to be obtained and vitals that have been documented are patient reported.  VideoDeclined- This patient declined Librarian, academic. Therefore the visit was completed with audio only.  Persons Participating in Visit: Patient.  AWV Questionnaire: No: Patient Medicare AWV questionnaire was not completed prior to this visit.  Cardiac Risk Factors include: advanced age (>6men, >75 women);dyslipidemia;Other (see comment), Risk factor comments: BPPV     Objective:     Today's Vitals   01/04/24 1516  Weight: 168 lb (76.2 kg)  Height: 5\' 5"  (1.651 m)   Body mass index is 27.96 kg/m.     01/04/2024    3:25 PM 06/14/2023   10:40 AM 06/08/2023   12:20 PM 06/06/2023    9:48 AM 05/31/2023    8:37 AM 01/03/2023    3:09 PM 12/14/2021   12:09 PM  Advanced Directives  Does Patient Have a Medical Advance Directive? Yes Yes Yes Yes Yes Yes Yes  Type of Estate agent of [place];Living will Healthcare Power of [place];Living will Healthcare Power of [employer] of  [place];Living will Healthcare Power of [place];Living will Healthcare Power of [place];Living will Living will;Healthcare Power of [place];Out of facility DNR (pink MOST or yellow form)  Does patient want to make changes to medical advance directive? No - Patient declined No - Patient declined No - Patient declined   No - Patient declined No - Patient declined  Copy of Healthcare Power of Attorney in Chart? Yes - validated most recent copy scanned in chart (See row information) No - copy requested Yes - validated most recent copy scanned in chart (See row information)   Yes - validated most recent copy scanned in chart (See row information) Yes - validated most recent copy scanned in chart (See row information)  Would patient like information on creating a medical advance directive?  No - Patient declined       Pre-existing out of facility DNR order (yellow form or pink MOST form)       Pink MOST/Yellow Form most recent copy in chart - Physician notified to receive inpatient order    Current Medications (verified) Outpatient Encounter Medications as of 01/04/2024  Medication Sig   acetaminophen  (TYLENOL ) 500 MG tablet Take 2 tablets (1,000 mg total) by mouth every 6 (six) hours.   azelastine (OPTIVAR) 0.05 % ophthalmic solution 1 drop 2 (two) times daily.   Calcium  Carbonate-Vit D-Min (CALCIUM  600+D3 PLUS MINERALS) 600-800 MG-UNIT TABS Take 2 tablets by mouth daily.   denosumab  (PROLIA ) 60 MG/ML SOSY injection Inject 60 mg into the skin every 6 (six) months.   fexofenadine (ALLEGRA) 180 MG tablet Take 180 mg by mouth daily.  fluorouracil (EFUDEX) 5 % cream 1 Application.   ipratropium (ATROVENT ) 0.03 % nasal spray Place into both nostrils.   metoprolol  tartrate (LOPRESSOR ) 25 MG tablet TAKE 1 TABLET(25 MG) BY MOUTH TWICE DAILY   Multiple Vitamin (MULTIVITAMIN) tablet Take 1 tablet by mouth daily.   Multiple Vitamins-Minerals (PRESERVISION AREDS 2 PO) Take 1 tablet by mouth daily.   Omega-3  Fatty Acids (FISH OIL ) 1000 MG CAPS Take 1 capsule (1,000 mg total) by mouth daily.   omeprazole  (PRILOSEC) 40 MG capsule TAKE 1 CAPSULE(40 MG) BY MOUTH DAILY   Probiotic Product (PROBIOTIC FORMULA PO) Take 1 tablet by mouth every evening.    rosuvastatin  (CRESTOR ) 5 MG tablet TAKE 1 TABLET(5 MG) BY MOUTH 4 TIMES A WEEK   SYMBICORT 80-4.5 MCG/ACT inhaler Inhale into the lungs.   tamoxifen  (NOLVADEX ) 10 MG tablet Take 1 tablet (10 mg total) by mouth daily.   levocetirizine (XYZAL) 5 MG tablet Take 5 mg by mouth daily. (Patient not taking: Reported on 01/04/2024)   Facility-Administered Encounter Medications as of 01/04/2024  Medication   [START ON 04/19/2024] denosumab  (PROLIA ) injection 60 mg    Allergies (verified) Aspirin , Codeine, and Morphine and codeine   History: Past Medical History:  Diagnosis Date   Asthma    BPPV (benign paroxysmal positional vertigo)    Breast cancer (HCC)    left mastectomy 1992, right lumpectomy 2019   Cancer (HCC)    Compression fracture of L3 lumbar vertebra 04/06/2015   Dysrhythmia 2019   pt states Cardiologist told her it is "pre A-fib"   Family history of adverse reaction to anesthesia    daughter had "throat close up" with anesthesia   Family history of breast cancer    Family history of colon cancer    Family history of pancreatic cancer    GERD (gastroesophageal reflux disease)    Hiatal hernia    pt states she doesnt have this   History of adenomatous polyp of colon    tubular adenoma 2005 and  2014 tubular adenoma and  hyperplastic polyp   History of breast cancer per pt no recurrence   dx 1992 --  s/p  left breast mastectomy and chemotherapy   Hyperlipidemia LDL goal <100    Osteoarthritis of right knee    Personal history of chemotherapy 1992   PONV (postoperative nausea and vomiting)    and "gets weak and light-leaded"   Sensation of pressure in bladder area    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   Vaginal vault prolapse    anterior    Wears glasses    Wears hearing aid    left only   Past Surgical History:  Procedure Laterality Date   BREAST BIOPSY Right 04/19/2018   BREAST BIOPSY Right 05/15/2023   MM RT BREAST BX W LOC DEV 1ST LESION IMAGE BX SPEC STEREO GUIDE 05/15/2023 [facility]   BREAST LUMPECTOMY Right 05/17/2018   BREAST LUMPECTOMY WITH RADIOACTIVE SEED LOCALIZATION Right 05/17/2018   Procedure: RIGHT BREAST LUMPECTOMY WITH RADIOACTIVE SEED LOCALIZATION;  Surgeon: Lockie Rima, MD;  Location: [facility];  Service: General;  Laterality: Right;   CATARACT EXTRACTION W/ INTRAOCULAR LENS  IMPLANT, BILATERAL  2014   CHOLECYSTECTOMY  1985   COLONOSCOPY  last one 06-04-2013   CYSTOCELE REPAIR N/A 02/29/2016   Procedure: ANTERIOR VAULT PROLAPSE REPAIR COLOPLAST SACROSPINUS FIXATION AUGMENTED AXIS DERMIS REPAIR  ;  Surgeon: Annamarie Kid, MD;  Location: [facility];  Service: Urology;  Laterality: N/A;   ESOPHAGOGASTRODUODENOSCOPY  08-31-2009   LAPAROSCOPIC ASSISTED VAGINAL HYSTERECTOMY  01-10-2000   w/ Bilateral Salpingoophorectomy /  Anterior & Posterior repair/  Pubovaginal Sling   MASTECTOMY Left 1992   SIMPLE MASTECTOMY WITH AXILLARY SENTINEL NODE BIOPSY Right 06/14/2023   Procedure: RIGHT MASTECTOMY WITH MAGTRACE INJECTION;  Surgeon: Lockie Rima, MD;  Location: [facility];  Service: General;  Laterality: Right;  PEC BLOCK   TOTAL KNEE ARTHROPLASTY Right 05/21/2021   Procedure: RIGHT TOTAL KNEE ARTHROPLASTY;  Surgeon: Adah Acron, MD;  Location: [facility];  Service: Orthopedics;  Laterality: Right;   Family History  Problem Relation Age of Onset   Heart attack Mother    Colon cancer Mother 29   Hypertension Mother    Heart disease Father    Breast cancer Sister        pat half sister dx in her 26s   Pancreatic cancer Other 42   Social History   Socioeconomic History   Marital status: Widowed    Spouse name: Not on file   Number of children: 4   Years of education:  Not on file   Highest education level: Not on file  Occupational History   Occupation: Retired Magazine features editor: RETIRED  Tobacco Use   Smoking status: Former    Current packs/day: 0.00    Types: Cigarettes    Start date: 10/12/1970    Quit date: 10/12/1983    Years since quitting: 40.2   Smokeless tobacco: Never  Vaping Use   Vaping status: Never Used  Substance and Sexual Activity   Alcohol use: No   Drug use: No   Sexual activity: Not on file  Other Topics Concern   Not on file  Social History Narrative   Son and daughter lives with her/2025   Social Drivers of Health   Financial Resource Strain: Low Risk  (01/04/2024)   Overall Financial Resource Strain (CARDIA)    Difficulty of Paying Living Expenses: Not hard at all  Food Insecurity: No Food Insecurity (01/04/2024)   Hunger Vital Sign    Worried About Running Out of Food in the Last Year: Never true    Ran Out of Food in the Last Year: Never true  Transportation Needs: No Transportation Needs (01/04/2024)   PRAPARE - Administrator, Civil Service (Medical): No    Lack of Transportation (Non-Medical): No  Physical Activity: Inactive (01/04/2024)   Exercise Vital Sign    Days of Exercise per Week: 0 days    Minutes of Exercise per Session: 0 min  Stress: No Stress Concern Present (01/04/2024)   [employer] of Occupational Health - Occupational Stress Questionnaire    Feeling of Stress : Not at all  Social Connections: Moderately Integrated (01/04/2024)   Social Connection and Isolation Panel [NHANES]    Frequency of Communication with Friends and Family: More than three times a week    Frequency of Social Gatherings with Friends and Family: More than three times a week    Attends Religious Services: More than 4 times per year    Active Member of [employer] or Organizations: Yes    Attends Banker Meetings: Never    Marital Status: Widowed    Tobacco Counseling Counseling given: Not  Answered    Clinical Intake:  Pre-visit preparation completed: Yes  Pain : No/denies pain     BMI - recorded: 27.96 Nutritional Status: BMI 25 -29 Overweight Nutritional Risks: None Diabetes: No  Lab Results  Component  Value Date   HGBA1C 5.8 09/25/2023   HGBA1C 5.9 09/21/2022   HGBA1C 5.8 03/29/2021     How often do you need to have someone help you when you read instructions, pamphlets, or other written materials from your doctor or pharmacy?: 1 - Never  Interpreter Needed?: No  Information entered by :: Herny Scurlock, RMA   Activities of Daily Living     01/04/2024    3:17 PM 06/14/2023   10:39 AM  In your present state of health, do you have any difficulty performing the following activities:  Hearing? 1 0  Comment wears hearing aides   Vision? 0 0  Difficulty concentrating or making decisions? 0 0  Walking or climbing stairs? 0   Dressing or bathing? 0   Doing errands, shopping? 0   Preparing Food and eating ? N   Using the Toilet? N   In the past six months, have you accidently leaked urine? Y   Do you have problems with loss of bowel control? N   Managing your Medications? N   Managing your Finances? N   Housekeeping or managing your Housekeeping? N     Patient Care Team: Adelia Homestead, MD as PCP - General (Internal Medicine) Wendie Hamburg, MD as PCP - Cardiology (Cardiology) Lockie Rima, MD as Consulting Physician (General Surgery) [name] Cea, MD as Consulting Physician (Hematology and Oncology) Retta Caster, MD as Consulting Physician (Radiation Oncology) Debbie Fails Laura Polio, NP as Nurse Practitioner (Hematology and Oncology) [facility] as Consulting Physician (Ophthalmology)  Indicate any recent Medical Services you may have received from other than [facility] providers in the past year (date may be approximate).     Assessment:    This is a routine wellness examination for [place].  Hearing/Vision  screen Hearing Screening - Comments:: wears hearing aides Vision Screening - Comments:: Denies vision issues./ [facility]    Goals Addressed             This Visit's Progress    My goal for 2024 is to continue to maintain my health by staying independent and active.  I would like to get back into water  aeorbics.   On track      Depression Screen     01/04/2024    3:28 PM 05/31/2023    8:46 AM 01/03/2023    3:10 PM 09/21/2022    9:57 AM 09/21/2022    9:56 AM 12/14/2021   11:42 AM 09/20/2021    1:20 PM  PHQ 2/9 Scores  PHQ - 2 Score 0 0 0 0 0 0 0  PHQ- 9 Score 0  0 0       Fall Risk     01/04/2024    3:25 PM 01/03/2023    3:10 PM 09/21/2022    9:56 AM 12/14/2021   11:38 AM 09/20/2021    1:20 PM  Fall Risk   Falls in the past year? 0 0 0 0 0  Number falls in past yr: 0 0 0 0 0  Injury with Fall? 0 0 0 0 0  Risk for fall due to : Medication side effect No Fall Risks  No Fall Risks   Follow up Falls evaluation completed;Falls prevention discussed Falls prevention discussed Falls evaluation completed Falls evaluation completed     MEDICARE RISK AT HOME:  Medicare Risk at Home Any stairs in or around the home?: Yes If so, are there any without handrails?: Yes Home free of loose throw rugs in walkways,  pet beds, electrical cords, etc?: Yes Adequate lighting in your home to reduce risk of falls?: Yes Life alert?: Yes (on cell phone) Use of a cane, walker or w/c?: No Grab bars in the bathroom?: Yes Shower chair or bench in shower?: Yes Elevated toilet seat or a handicapped toilet?: Yes  TIMED UP AND GO:  Was the test performed?  No  Cognitive Function: 6CIT completed    05/08/2020    8:44 AM 04/30/2018    8:48 AM 04/03/2017    2:34 PM 01/28/2016    2:00 PM  MMSE - Mini Mental State Exam  Orientation to time 5 5 5 5   Orientation to Place 5 5 5 5   Registration 3 3 3 3   Attention/ Calculation 5 5 5 4   Recall 3 2 3 3   Language- name 2 objects 2 2 2 2   Language- repeat 1  1 1 1   Language- follow 3 step command 3 3 3 3   Language- read & follow direction 0 1 1 1   Language-read & follow direction-comments Patient read "Close your eyes" inside her head. But didn't read out loud.     Write a sentence 1 1 1 1   Copy design 0 1 1 1   Copy design-comments Object drawn by patient has more than 10+ sides. (18 sides )     Total score 28 29 30 29         01/03/2023    3:10 PM 12/14/2021   12:17 PM 05/03/2019    8:48 AM  6CIT Screen  What Year? 0 points 0 points 0 points  What month? 0 points 0 points 0 points  What time? 0 points 0 points 0 points  Count back from 20 0 points 0 points 0 points  Months in reverse 0 points 0 points 0 points  Repeat phrase 0 points 0 points 0 points  Total Score 0 points 0 points 0 points    Immunizations Immunization History  Administered Date(s) Administered   Fluad Quad(high Dose 65+) 05/02/2019, 05/07/2020, 04/19/2021, 04/22/2022   Fluad Trivalent(High Dose 65+) 04/19/2023   Influenza, High Dose Seasonal PF 04/03/2017, 04/30/2018   Influenza, Quadrivalent, Recombinant, Inj, Pf 03/25/2016   Influenza,inj,Quad PF,6+ Mos 04/11/2013   Influenza-Unspecified 08/09/2011, 05/30/2014   PFIZER(Purple Top)SARS-COV-2 Vaccination 08/22/2019, 09/12/2019, 06/01/2020, 01/27/2021   Pfizer Covid-19 Vaccine Bivalent Booster 51yrs & up 10/05/2021   Pneumococcal Conjugate-13 07/11/2013   Pneumococcal Polysaccharide-23 07/25/2014   Tdap 01/26/2015   Zoster Recombinant(Shingrix) 04/28/2017, 10/20/2017   Zoster, Live 08/08/2010    Screening Tests Health Maintenance  Topic Date Due   COVID-19 Vaccine (6 - 2024-25 season) 04/09/2023   INFLUENZA VACCINE  03/08/2024   Medicare Annual Wellness (AWV)  01/03/2025   DTaP/Tdap/Td (2 - Td or Tdap) 01/25/2025   Pneumonia Vaccine 34+ Years old  Completed   DEXA SCAN  Completed   Zoster Vaccines- Shingrix  Completed   HPV VACCINES  Aged Out   Meningococcal B Vaccine  Aged Out    Health  Maintenance  Health Maintenance Due  Topic Date Due   COVID-19 Vaccine (6 - 2024-25 season) 04/09/2023   Health Maintenance Items Addressed: See Nurse Notes  Additional Screening:  Vision Screening: Recommended annual ophthalmology exams for early detection of glaucoma and other disorders of the eye.  Dental Screening: Recommended annual dental exams for proper oral hygiene  Community Resource Referral / Chronic Care Management: CRR required this visit?  No   CCM required this visit?  No   Plan:  I have personally reviewed and noted the following in the patient's chart:   Medical and social history Use of alcohol, tobacco or illicit drugs  Current medications and supplements including opioid prescriptions. Patient is not currently taking opioid prescriptions. Functional ability and status Nutritional status Physical activity Advanced directives List of other physicians Hospitalizations, surgeries, and ER visits in previous 12 months Vitals Screenings to include cognitive, depression, and falls Referrals and appointments  In addition, I have reviewed and discussed with patient certain preventive protocols, quality metrics, and best practice recommendations. A written personalized care plan for preventive services as well as general preventive health recommendations were provided to patient.   Lauralye Kinn L Ab Leaming, CMA   01/04/2024   After Visit Summary: (MyChart) Due to this being a telephonic visit, the after visit summary with patients personalized plan was offered to patient via MyChart   Notes: Nothing significant to report at this time.

## 2024-01-04 NOTE — Patient Instructions (Signed)
 Rebecca Guzman , Thank you for taking time out of your busy schedule to complete your Annual Wellness Visit with me. I enjoyed our conversation and look forward to speaking with you again next year. I, as well as your care team,  appreciate your ongoing commitment to your health goals. Please review the following plan we discussed and let me know if I can assist you in the future. Your Game plan/ To Do List   Follow up Visits: Next Medicare AWV with our clinical staff: 01/09/2025.   Have you seen your provider in the last 6 months (3 months if uncontrolled diabetes)? Yes Next Office Visit with your provider: Last office visit on 09/25/2023.  Clinician Recommendations:  Aim for 30 minutes of exercise or brisk walking, 6-8 glasses of water , and 5 servings of fruits and vegetables each day. Keep up the good work.      This is a list of the screening recommended for you and due dates:  Health Maintenance  Topic Date Due   COVID-19 Vaccine (6 - 2024-25 season) 04/09/2023   Medicare Annual Wellness Visit  01/03/2024   Flu Shot  03/08/2024   DTaP/Tdap/Td vaccine (2 - Td or Tdap) 01/25/2025   Pneumonia Vaccine  Completed   DEXA scan (bone density measurement)  Completed   Zoster (Shingles) Vaccine  Completed   HPV Vaccine  Aged Out   Meningitis B Vaccine  Aged Out    Advanced directives: (In Chart) A copy of your advanced directives are scanned into your chart should your provider ever need it. Advance Care Planning is important because it:  [x]  Makes sure you receive the medical care that is consistent with your values, goals, and preferences  [x]  It provides guidance to your family and loved ones and reduces their decisional burden about whether or not they are making the right decisions based on your wishes.  Follow the link provided in your after visit summary or read over the paperwork we have mailed to you to help you started getting your Advance Directives in place. If you need assistance in  completing these, please reach out to us  so that we can help you!  See attachments for Preventive Care and Fall Prevention Tips.

## 2024-01-13 ENCOUNTER — Other Ambulatory Visit: Payer: Self-pay | Admitting: Cardiology

## 2024-01-13 DIAGNOSIS — E782 Mixed hyperlipidemia: Secondary | ICD-10-CM

## 2024-01-13 DIAGNOSIS — I491 Atrial premature depolarization: Secondary | ICD-10-CM

## 2024-01-13 DIAGNOSIS — Z9221 Personal history of antineoplastic chemotherapy: Secondary | ICD-10-CM

## 2024-01-13 DIAGNOSIS — R002 Palpitations: Secondary | ICD-10-CM

## 2024-01-13 DIAGNOSIS — I10 Essential (primary) hypertension: Secondary | ICD-10-CM

## 2024-01-15 DIAGNOSIS — C50211 Malignant neoplasm of upper-inner quadrant of right female breast: Secondary | ICD-10-CM | POA: Diagnosis not present

## 2024-01-15 DIAGNOSIS — Z170 Estrogen receptor positive status [ER+]: Secondary | ICD-10-CM | POA: Diagnosis not present

## 2024-01-15 DIAGNOSIS — Z853 Personal history of malignant neoplasm of breast: Secondary | ICD-10-CM | POA: Diagnosis not present

## 2024-01-15 DIAGNOSIS — C773 Secondary and unspecified malignant neoplasm of axilla and upper limb lymph nodes: Secondary | ICD-10-CM | POA: Diagnosis not present

## 2024-01-15 DIAGNOSIS — Z860100 Personal history of colon polyps, unspecified: Secondary | ICD-10-CM | POA: Diagnosis not present

## 2024-01-15 DIAGNOSIS — Z809 Family history of malignant neoplasm, unspecified: Secondary | ICD-10-CM | POA: Diagnosis not present

## 2024-02-01 DIAGNOSIS — N8111 Cystocele, midline: Secondary | ICD-10-CM | POA: Diagnosis not present

## 2024-02-01 DIAGNOSIS — R351 Nocturia: Secondary | ICD-10-CM | POA: Diagnosis not present

## 2024-02-01 DIAGNOSIS — N3946 Mixed incontinence: Secondary | ICD-10-CM | POA: Diagnosis not present

## 2024-02-01 DIAGNOSIS — N390 Urinary tract infection, site not specified: Secondary | ICD-10-CM | POA: Diagnosis not present

## 2024-02-07 DIAGNOSIS — L988 Other specified disorders of the skin and subcutaneous tissue: Secondary | ICD-10-CM | POA: Diagnosis not present

## 2024-02-07 DIAGNOSIS — D485 Neoplasm of uncertain behavior of skin: Secondary | ICD-10-CM | POA: Diagnosis not present

## 2024-02-26 DIAGNOSIS — N8182 Incompetence or weakening of pubocervical tissue: Secondary | ICD-10-CM | POA: Diagnosis not present

## 2024-02-28 DIAGNOSIS — Z86018 Personal history of other benign neoplasm: Secondary | ICD-10-CM | POA: Diagnosis not present

## 2024-02-28 DIAGNOSIS — L578 Other skin changes due to chronic exposure to nonionizing radiation: Secondary | ICD-10-CM | POA: Diagnosis not present

## 2024-02-28 DIAGNOSIS — D2271 Melanocytic nevi of right lower limb, including hip: Secondary | ICD-10-CM | POA: Diagnosis not present

## 2024-02-28 DIAGNOSIS — L814 Other melanin hyperpigmentation: Secondary | ICD-10-CM | POA: Diagnosis not present

## 2024-02-28 DIAGNOSIS — L821 Other seborrheic keratosis: Secondary | ICD-10-CM | POA: Diagnosis not present

## 2024-02-28 DIAGNOSIS — D2272 Melanocytic nevi of left lower limb, including hip: Secondary | ICD-10-CM | POA: Diagnosis not present

## 2024-02-28 DIAGNOSIS — D2262 Melanocytic nevi of left upper limb, including shoulder: Secondary | ICD-10-CM | POA: Diagnosis not present

## 2024-02-28 DIAGNOSIS — Z85820 Personal history of malignant melanoma of skin: Secondary | ICD-10-CM | POA: Diagnosis not present

## 2024-03-04 ENCOUNTER — Inpatient Hospital Stay: Payer: Medicare PPO | Attending: Hematology and Oncology | Admitting: Hematology and Oncology

## 2024-03-04 VITALS — BP 128/62 | HR 75 | Temp 98.2°F | Resp 16 | Ht 65.0 in | Wt 165.4 lb

## 2024-03-04 DIAGNOSIS — Z79899 Other long term (current) drug therapy: Secondary | ICD-10-CM | POA: Insufficient documentation

## 2024-03-04 DIAGNOSIS — Z1732 Human epidermal growth factor receptor 2 negative status: Secondary | ICD-10-CM | POA: Diagnosis not present

## 2024-03-04 DIAGNOSIS — Z79810 Long term (current) use of selective estrogen receptor modulators (SERMs): Secondary | ICD-10-CM | POA: Diagnosis not present

## 2024-03-04 DIAGNOSIS — C50411 Malignant neoplasm of upper-outer quadrant of right female breast: Secondary | ICD-10-CM | POA: Insufficient documentation

## 2024-03-04 DIAGNOSIS — M8580 Other specified disorders of bone density and structure, unspecified site: Secondary | ICD-10-CM | POA: Insufficient documentation

## 2024-03-04 DIAGNOSIS — Z170 Estrogen receptor positive status [ER+]: Secondary | ICD-10-CM | POA: Insufficient documentation

## 2024-03-04 DIAGNOSIS — Z1721 Progesterone receptor positive status: Secondary | ICD-10-CM | POA: Insufficient documentation

## 2024-03-04 DIAGNOSIS — Z9013 Acquired absence of bilateral breasts and nipples: Secondary | ICD-10-CM | POA: Insufficient documentation

## 2024-03-04 MED ORDER — TAMOXIFEN CITRATE 10 MG PO TABS: 10.0000 mg | ORAL_TABLET | ORAL | Status: AC

## 2024-03-04 NOTE — Progress Notes (Signed)
 Patient Care Team: Rollene Almarie LABOR, MD as PCP - General (Internal Medicine) Kate Lonni CROME, MD as PCP - Cardiology (Cardiology) Aron Shoulders, MD as Consulting Physician (General Surgery) Odean Potts, MD as Consulting Physician (Hematology and Oncology) Shannon Agent, MD as Consulting Physician (Radiation Oncology) Crawford Morna Pickle, NP as Nurse Practitioner (Hematology and Oncology) [facility] as Consulting Physician (Ophthalmology)  DIAGNOSIS:  Encounter Diagnosis  Name Primary?   Malignant neoplasm of upper-outer quadrant of right breast in female, estrogen receptor positive (HCC) Yes    SUMMARY OF ONCOLOGIC HISTORY: Oncology History Overview Note  PMS2 VUS on Multi-cancer panel.   Malignant neoplasm of upper-outer quadrant of right breast in female, estrogen receptor positive (HCC)  1992 Miscellaneous   Left breast cancer treated with mastectomy followed by adjuvant chemotherapy and 5 years of tamoxifen    04/17/2018 Initial Diagnosis   Screening detected right breast mass, by ultrasound measured 5 mm UIQ middle depth, biopsy revealed grade 1 IDC with DCIS, ER 100%, PR 100%, HER-2 -1+ by IHC, T1 a N0 stage I a clinical stage   05/17/2018 Surgery   Right lumpectomy: IDC, 5.5 mm, grade 1, margins negative, ER 100%, PR 100%, HER-2 1+ by IHC, Ki-67 less than 1%, T1BNX stage Ia   05/28/2018 -  Anti-estrogen oral therapy   Letrozole  daily discontinued 03/04/19, switched to anastrozole  03/23/2019, switched to tamoxifen  06/04/19   05/30/2018 Cancer Staging   Staging form: Breast, AJCC 8th Edition - Pathologic: Stage IA (pT1b, pN0, cM0, G1, ER+, PR+, HER2-) - Signed by Crawford Morna Pickle, NP on 05/30/2018   06/05/2018 Genetic Testing   PMS2 c.2559C>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  The Multi-Gene Panel offered by [facility] includes sequencing and/or deletion duplication testing of the following 84 genes: AIP, ALK, APC,  ATM, AXIN2,BAP1,  BARD1, BLM, BMPR1A, BRCA1, BRCA2, BRIP1, CASR, CDC73, CDH1, CDK4, CDKN1B, CDKN1C, CDKN2A (p14ARF), CDKN2A (p16INK4a), CEBPA, CHEK2, CTNNA1, DICER1, DIS3L2, EGFR (c.2369C>T, p.Thr790Met variant only), EPCAM (Deletion/duplication testing only), FH, FLCN, GATA2, GPC3, GREM1 (Promoter region deletion/duplication testing only), HOXB13 (c.251G>A, p.Gly84Glu), HRAS, KIT, MAX, MEN1, MET, MITF (c.952G>A, p.Glu318Lys variant only), MLH1, MSH2, MSH3, MSH6, MUTYH, NBN, NF1, NF2, NTHL1, PALB2, PDGFRA, PHOX2B, PMS2, POLD1, POLE, POT1, PRKAR1A, PTCH1, PTEN, RAD50, RAD51C, RAD51D, RB1, RECQL4, RET, RUNX1, SDHAF2, SDHA (sequence changes only), SDHB, SDHC, SDHD, SMAD4, SMARCA4, SMARCB1, SMARCE1, STK11, SUFU, TERC, TERT, TMEM127, TP53, TSC1, TSC2, VHL, WRN and WT1.  The report date is 06/05/2018.     CHIEF COMPLIANT: Follow-up on tamoxifen  therapy  HISTORY OF PRESENT ILLNESS: History of Present Illness Rebecca Guzman is an 89 year old female with a history of breast cancer who presents for follow-up regarding her tamoxifen  therapy.  She has been on tamoxifen  for seven months, taking it every other day due to fatigue. She experiences intermittent fatigue but no hot flashes or other side effects. There is a slight decrease in appetite with a weight loss of four to five pounds, now stabilized.  She receives Prolia  injections biannually for osteopenia, with a bone density score of minus 2.2. She has scoliosis and arthritis in her right hip.     ALLERGIES:  is allergic to aspirin , codeine, and morphine and codeine.  MEDICATIONS:  Current Outpatient Medications  Medication Sig Dispense Refill   acetaminophen  (TYLENOL ) 500 MG tablet Take 2 tablets (1,000 mg total) by mouth every 6 (six) hours. 30 tablet 0   Calcium  Carbonate-Vit D-Min (CALCIUM  600+D3 PLUS MINERALS) 600-800 MG-UNIT TABS Take 2 tablets by mouth  daily. 180 tablet 3   denosumab  (PROLIA ) 60 MG/ML SOSY injection Inject 60 mg into the skin  every 6 (six) months.     fexofenadine (ALLEGRA) 180 MG tablet Take 180 mg by mouth daily.     ipratropium (ATROVENT ) 0.03 % nasal spray Place into both nostrils.     levocetirizine (XYZAL) 5 MG tablet Take 5 mg by mouth daily.     metoprolol  tartrate (LOPRESSOR ) 25 MG tablet TAKE 1 TABLET(25 MG) BY MOUTH TWICE DAILY 180 tablet 2   Multiple Vitamin (MULTIVITAMIN) tablet Take 1 tablet by mouth daily.     Multiple Vitamins-Minerals (PRESERVISION AREDS 2 PO) Take 1 tablet by mouth daily.     Omega-3 Fatty Acids (FISH OIL ) 1000 MG CAPS Take 1 capsule (1,000 mg total) by mouth daily.  0   omeprazole  (PRILOSEC) 40 MG capsule TAKE 1 CAPSULE(40 MG) BY MOUTH DAILY 90 capsule 3   Probiotic Product (PROBIOTIC FORMULA PO) Take 1 tablet by mouth every evening.      rosuvastatin  (CRESTOR ) 5 MG tablet TAKE 1 TABLET(5 MG) BY MOUTH 4 TIMES A WEEK 48 tablet 1   SYMBICORT 80-4.5 MCG/ACT inhaler Inhale into the lungs.     tamoxifen  (NOLVADEX ) 10 MG tablet Take 1 tablet (10 mg total) by mouth daily. 90 tablet 3   Current Facility-Administered Medications  Medication Dose Route Frequency Provider Last Rate Last Admin   [START ON 04/19/2024] denosumab  (PROLIA ) injection 60 mg  60 mg Subcutaneous Once Alvia Corean CROME, FNP        PHYSICAL EXAMINATION: ECOG PERFORMANCE STATUS: 1 - Symptomatic but completely ambulatory  Vitals:   03/04/24 0821  BP: 128/62  Pulse: 75  Resp: 16  Temp: 98.2 F (36.8 C)  SpO2: 97%   Filed Weights   03/04/24 0821  Weight: 165 lb 6.4 oz (75 kg)    Physical Exam   (exam performed in the presence of a chaperone)  LABORATORY DATA:  I have reviewed the data as listed    Latest Ref Rng & Units 09/25/2023   10:37 AM 09/21/2022   10:35 AM 05/18/2021    9:40 AM  CMP  Glucose 70 - 99 mg/dL 82  79  88   BUN 6 - 23 mg/dL 22  23  14    Creatinine 0.40 - 1.20 mg/dL 9.25  9.13  9.31   Sodium 135 - 145 mEq/L 140  139  139   Potassium 3.5 - 5.1 mEq/L 3.9  4.3  3.9   Chloride  96 - 112 mEq/L 105  105  106   CO2 19 - 32 mEq/L 26  29  28    Calcium  8.4 - 10.5 mg/dL 9.2  89.9  9.1   Total Protein 6.0 - 8.3 g/dL 7.4  7.3  6.6   Total Bilirubin 0.2 - 1.2 mg/dL 0.4  0.5  0.7   Alkaline Phos 39 - 117 U/L 42  50  57   AST 0 - 37 U/L 22  19  21    ALT 0 - 35 U/L 16  17  18      Lab Results  Component Value Date   WBC 9.2 09/25/2023   HGB 13.4 09/25/2023   HCT 39.7 09/25/2023   MCV 91.5 09/25/2023   PLT 212.0 09/25/2023   NEUTROABS 4,449 05/08/2020    ASSESSMENT & PLAN:  Malignant neoplasm of upper-outer quadrant of right breast in female, estrogen receptor positive (HCC) 05/17/2018: Right lumpectomy: IDC, 5.5 mm, grade 1, margins negative, ER 100%,  PR 100%, HER-2 1+ by IHC, Ki-67 less than 1%, T1BNX stage Ia History of left mastectomy 1992 We did not recommend adjuvant radiation therapy because of her favorable profile and her age.   06/14/2023: Right mastectomy: 0.7 cm grade 2 invasive moderately differentiated ductal carcinoma with focal DCIS, suspicious for angiolymphatic invasion, margins negative, 2/4 lymph nodes positive, ER 80%, PR 80%, Ki67 5%, HER2 2+ by IHC, FISH negative ratio 1.17  Declined radiation   Current treatment: Tamoxifen  10 mg daily started December 2024 (taking it every other day)   Tamoxifen  toxicities: Tolerating it well Slight decreased appetite: Lost 4 pounds  Breast cancer surveillance: Breast exam: 03/04/2024: Benign Mammogram no role of mammograms since she had bilateral mastectomies   Osteopenia: Currently on Prolia  with her PCP.  She will discuss with her PCP about reducing the Prolia  frequency to once a year. Vaginal dryness/frequent UTIs: Seeing urology.  I discussed with her that if she needs estrogen cream topically she is fine to proceed because of recent data that showed that topical estrogen cream did not increase breast cancer risk.  On top of that she is currently on tamoxifen  which would also be protecting her from breast  cancer.  Return to clinic in 1 year for follow-up ------------------------------------- Assessment and Plan Assessment & Plan Malignant neoplasm of right breast Estrogen receptor positive breast cancer, post bilateral mastectomies, on tamoxifen . Fatigue noted as a side effect, managed by adjusting dosage. Weight stable, no further imaging needed. - Continue tamoxifen  10 mg orally every other day. - Schedule annual follow-up visits.  Osteopenia Osteopenia with bone density score of -2.2. Prolia  administered biannually. Tamoxifen  expected to improve bone density. Considering reducing Prolia  frequency. - Discuss with primary care provider about potentially reducing Prolia  injections to once a year. - Consider the impact of tamoxifen  on bone density improvement.  Urinary tract infections, recurrent Recurrent UTIs managed by gynecologist, referred to urologist. Scheduled kidney scan. Pessary use causes irritation, treated with estrogen cream. Vaginal estrogen safe with tamoxifen . - Proceed with scheduled kidney scan. - Use estrogen cream for pessary-related irritation if needed, as it is safe with tamoxifen . - Communicate with gynecologist regarding the use of estrogen cream.  Scoliosis Scoliosis with arthritis in right hip and slipped disc.  General Health Maintenance  Follow-Up Scheduled follow-up with gynecologist for pessary management. Next oncology follow-up in one year.      No orders of the defined types were placed in this encounter.  The patient has a good understanding of the overall plan. she agrees with it. she will call with any problems that may develop before the next visit here. Total time spent: 30 mins including face to face time and time spent for planning, charting and co-ordination of care   Rebecca MARLA Chad, MD 03/04/24

## 2024-03-04 NOTE — Assessment & Plan Note (Signed)
 05/17/2018: Right lumpectomy: IDC, 5.5 mm, grade 1, margins negative, ER 100%, PR 100%, HER-2 1+ by IHC, Ki-67 less than 1%, T1BNX stage Ia History of left mastectomy 1992 We did not recommend adjuvant radiation therapy because of her favorable profile and her age.   06/14/2023: Right mastectomy: 0.7 cm grade 2 invasive moderately differentiated ductal carcinoma with focal DCIS, suspicious for angiolymphatic invasion, margins negative, 2/4 lymph nodes positive, ER 80%, PR 80%, Ki67 5%, HER2 2+ by IHC, FISH negative ratio 1.17  Declined radiation   Current treatment: Tamoxifen  10 mg daily started December 2024   Tamoxifen  toxicities: Tolerating it well Slight decreased appetite  Breast cancer surveillance: Breast exam: 03/04/2024: Benign Mammogram no role of mammograms since she had bilateral mastectomies   Return to clinic in 1 year for follow-up

## 2024-03-19 ENCOUNTER — Telehealth: Payer: Self-pay

## 2024-03-19 NOTE — Telephone Encounter (Signed)
 Prolia  VOB initiated via [email or web address]  Next Prolia  inj DUE: 04/19/24

## 2024-03-20 ENCOUNTER — Other Ambulatory Visit (HOSPITAL_COMMUNITY): Payer: Self-pay

## 2024-03-20 NOTE — Telephone Encounter (Signed)
 SABRA

## 2024-03-20 NOTE — Telephone Encounter (Signed)
 Pt ready for scheduling for PROLIA  on or after : 04/19/24  Option# 1: Buy/Bill (Office supplied medication)  Out-of-pocket cost due at time of clinic visit: $357  Number of injection/visits approved: 2  Primary: [employer] Prolia  co-insurance: 20% Admin fee co-insurance: 20%  Secondary: --- Prolia  co-insurance:  Admin fee co-insurance:   Medical Benefit Details: Date Benefits were checked: 03/19/24 Deductible: NO/ Coinsurance: 20%/ Admin Fee: 20%  Prior Auth: APPROVED PA# [id number] Expiration Date: 12/27/19-08/07/24   # of doses approved: 2 ----------------------------------------------------------------------- Option# 2- Med Obtained from pharmacy:  Pharmacy benefit: Copay $64 (Paid to pharmacy) Admin Fee: 20% (Pay at clinic)  Prior Auth: N/A PA# Expiration Date:   # of doses approved:   If patient wants fill through the pharmacy benefit please send prescription to: [facility], and include estimated need by date in rx notes. Pharmacy will ship medication directly to the office.  Patient NOT eligible for Prolia  Copay Card. Copay Card can make patient's cost as little as $25. Link to apply: [email or web address]  ** This summary of benefits is an estimation of the patient's out-of-pocket cost. Exact cost may very based on individual plan coverage.

## 2024-03-26 DIAGNOSIS — R3914 Feeling of incomplete bladder emptying: Secondary | ICD-10-CM | POA: Diagnosis not present

## 2024-03-26 DIAGNOSIS — N390 Urinary tract infection, site not specified: Secondary | ICD-10-CM | POA: Diagnosis not present

## 2024-04-11 ENCOUNTER — Other Ambulatory Visit (INDEPENDENT_AMBULATORY_CARE_PROVIDER_SITE_OTHER)

## 2024-04-11 ENCOUNTER — Ambulatory Visit: Admitting: Orthopedic Surgery

## 2024-04-11 VITALS — BP 151/76 | HR 75 | Ht 65.0 in | Wt 166.0 lb

## 2024-04-11 DIAGNOSIS — R2689 Other abnormalities of gait and mobility: Secondary | ICD-10-CM

## 2024-04-11 DIAGNOSIS — M545 Low back pain, unspecified: Secondary | ICD-10-CM

## 2024-04-11 NOTE — Progress Notes (Signed)
 Orthopedic Spine Surgery Office Note  Assessment: Patient is a 89 y.o. female with chronic low back pain with a degenerative lumbar scoliosis.  No radicular symptoms   Plan: -Since the majority of the pain is in her back and she has facet arthropathy in the lower lumbar spine recommended, facet injections. If she does well with those, could get RFA in the future -Recommended PT for her balance. Has no signs of myelopathy and no hand symptoms so do not feel she needs any advanced imaging at this time. Referral provided to her today -Can use tylenol  up to 1000mg  TID -Patient should return to office on an as needed basis   Patient expressed understanding of the plan and all questions were answered to the patient's satisfaction.   ___________________________________________________________________________   History:  Patient is a 89 y.o. female who presents today for lumbar spine.  Patient was previously seeing Dr. Barbarann about her back but now that he has retired, she comes in today to see me about her lumbar spine.  She has a long history of lower back pain.  She feels it in the lower lumbar region in the area of the belt line.  She notes it on a daily basis.  It is worse with activity and improves with rest.  She does not have any pain radiating into either lower extremity.  There is no trauma or injury that preceded the onset of the pain.  She also feels that her balance has gotten a little worse lately.  She has no other complaints on today's visit.   Weakness: Denies Symptoms of imbalance: Yes, feels her balance has gotten slightly worse within the last couple of months No trouble with fine motor skills in the hands Paresthesias and numbness: Denies Bowel or bladder incontinence: Yes, has a long history of urinary continence and uses pessaries.  Seen by urology.  No bowel symptoms Saddle anesthesia: Denies  Treatments tried: Tylenol , steroid injections  Review of systems: Denies  fevers and chills, night sweats, unexplained weight loss, history of cancer, pain that wakes her at night  Past medical history: Osteoporosis on Prolia  HLD Breast cancer in remission GERD Atrial fibrillation Chronic pain BPPV  Allergies: aspirin , codeine, morphine  Past surgical history:  Cholecystectomy Right TKA Mastectomies  Cataract surgery Cystocele repair  Social history: Denies use of nicotine product (smoking, vaping, patches, smokeless) Alcohol use: denies Denies recreational drug use   Physical Exam:  BMI of 27.6  General: no acute distress, appears stated age Neurologic: alert, answering questions appropriately, following commands Respiratory: unlabored breathing on room air, symmetric chest rise Psychiatric: appropriate affect, normal cadence to speech   MSK (spine):  -Strength exam      Left  Right EHL    5/5  5/5 TA    5/5  5/5 GSC    5/5  5/5 Knee extension  5/5  5/5 Hip flexion   5/5  5/5  -Sensory exam    Sensation intact to light touch in L3-S1 nerve distributions of bilateral lower extremities  -Achilles DTR: 1/4 on the left, 1/4 on the right -Patellar tendon DTR: 2/4 on the left, 2/4 on the right -Biceps DTR: 2/4 on the left, 2/4 on the right -Brachioradialis DTR: 2/4 on the left, 2/4 on the right  -Straight leg raise: negative bilaterally -Clonus: no beats bilaterally  -Left hip exam: no pain through range of motion -Right hip exam: no pain through range of motion  Imaging: XRs of the lumbar spine from 04/11/2024  were independently reviewed and interpreted, showing lumbar degenerative scoliosis with apex to the left at L2/3.  Disc height loss at L2/3 and L3/4.  No spondylolisthesis seen.  No evidence of instability on flexion/extension views.  Suspect an old L2 compression fracture but no other fracture seen.  No dislocation seen.    Patient name: Rebecca Guzman Patient MRN: [id number] Date of visit: 04/11/24

## 2024-04-15 ENCOUNTER — Other Ambulatory Visit: Payer: Self-pay

## 2024-04-15 ENCOUNTER — Other Ambulatory Visit (HOSPITAL_COMMUNITY): Payer: Self-pay

## 2024-04-15 ENCOUNTER — Other Ambulatory Visit: Payer: Self-pay | Admitting: Pharmacy Technician

## 2024-04-15 DIAGNOSIS — M810 Age-related osteoporosis without current pathological fracture: Secondary | ICD-10-CM

## 2024-04-15 MED ORDER — DENOSUMAB 60 MG/ML ~~LOC~~ SOSY
60.0000 mg | PREFILLED_SYRINGE | SUBCUTANEOUS | 0 refills | Status: AC
  Filled 2024-04-15: qty 180, fill #0
  Filled 2024-04-15: qty 1, 180d supply, fill #0

## 2024-04-15 NOTE — Progress Notes (Signed)
 Pharmacy Patient Advocate Encounter  Insurance verification completed.   The patient is insured through [employer]   Ran test claim for Prolia. Co-pay is $64.  This test claim was processed through [facility]- copay amounts may vary at other pharmacies due to pharmacy/plan contracts, or as the patient moves through the different stages of their insurance plan.

## 2024-04-15 NOTE — Progress Notes (Signed)
 Specialty Pharmacy Initial Fill Coordination Note  Rebecca Guzman is a 89 y.o. female contacted today regarding initial fill of specialty medication(s) Denosumab  (PROLIA )   Patient requested Courier to Provider Office   Delivery date: 04/17/24   Verified address: [facility] [address]   Medication will be filled on 9/9.   Patient is aware of $64 copayment.

## 2024-04-16 ENCOUNTER — Other Ambulatory Visit: Payer: Self-pay

## 2024-04-19 ENCOUNTER — Ambulatory Visit

## 2024-04-19 DIAGNOSIS — M810 Age-related osteoporosis without current pathological fracture: Secondary | ICD-10-CM

## 2024-04-19 DIAGNOSIS — Z23 Encounter for immunization: Secondary | ICD-10-CM | POA: Diagnosis not present

## 2024-04-19 MED ORDER — DENOSUMAB 60 MG/ML ~~LOC~~ SOSY: 60.0000 mg | PREFILLED_SYRINGE | SUBCUTANEOUS | Status: AC

## 2024-04-19 NOTE — Progress Notes (Signed)
 Patient visits today to receive her Prolia  injection/vaccine. Patient was informed and tolerated well. Patient was notified to reach out to us  if needed.   Patient is cleared for Prolia  injection on 04/19/2024 per PA information on 03/20/2024.  Mentioned in provider note last on 09/25/23. Medication is Patient supplied. CoPay:Nothing at the time of her visit she had to pay $64 to the pharmacy.   Patient also got her HD flu vaccine today. I administered it into her Left Deltoid muscle. She tolerated the inject well.

## 2024-04-23 DIAGNOSIS — R3914 Feeling of incomplete bladder emptying: Secondary | ICD-10-CM | POA: Diagnosis not present

## 2024-04-29 ENCOUNTER — Encounter: Payer: Self-pay | Admitting: Physical Therapy

## 2024-04-29 ENCOUNTER — Ambulatory Visit: Admitting: Physical Therapy

## 2024-04-29 DIAGNOSIS — R2681 Unsteadiness on feet: Secondary | ICD-10-CM

## 2024-04-29 DIAGNOSIS — R262 Difficulty in walking, not elsewhere classified: Secondary | ICD-10-CM

## 2024-04-29 DIAGNOSIS — R2689 Other abnormalities of gait and mobility: Secondary | ICD-10-CM | POA: Diagnosis not present

## 2024-04-29 DIAGNOSIS — M6281 Muscle weakness (generalized): Secondary | ICD-10-CM

## 2024-04-29 NOTE — Therapy (Signed)
 OUTPATIENT PHYSICAL THERAPY LOWER EXTREMITY EVALUATION   Patient Name: Rebecca Guzman MRN: [id number] DOB:04-Nov-1933, 89 y.o., female Today's Date: 04/29/2024  END OF SESSION:  PT End of Session - 04/29/24 1725     Visit Number 1    Number of Visits 11    Date for Recertification  07/12/24    Authorization Type [employer]    Progress Note Due on Visit 10    PT Start Time 1300    PT Stop Time 1340    PT Time Calculation (min) 40 min    Activity Tolerance Patient tolerated treatment well    Behavior During Therapy WFL for tasks assessed/performed          Past Medical History:  Diagnosis Date   Asthma    BPPV (benign paroxysmal positional vertigo)    Breast cancer (HCC)    left mastectomy 1992, right lumpectomy 2019   Cancer (HCC)    Compression fracture of L3 lumbar vertebra 04/06/2015   Dysrhythmia 2019   pt states Cardiologist told her it is pre A-fib   Family history of adverse reaction to anesthesia    daughter had throat close up with anesthesia   Family history of breast cancer    Family history of colon cancer    Family history of pancreatic cancer    GERD (gastroesophageal reflux disease)    Hiatal hernia    pt states she doesnt have this   History of adenomatous polyp of colon    tubular adenoma 2005 and  2014 tubular adenoma and  hyperplastic polyp   History of breast cancer per pt no recurrence   dx 1992 --  s/p  left breast mastectomy and chemotherapy   Hyperlipidemia LDL goal <100    Osteoarthritis of right knee    Personal history of chemotherapy 1992   PONV (postoperative nausea and vomiting)    and gets weak and light-leaded   Sensation of pressure in bladder area    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   Vaginal vault prolapse    anterior   Wears glasses    Wears hearing aid    left only   Past Surgical History:  Procedure Laterality Date   BREAST BIOPSY Right 04/19/2018   BREAST BIOPSY Right 05/15/2023   MM RT BREAST BX W LOC DEV 1ST LESION  IMAGE BX SPEC STEREO GUIDE 05/15/2023 [facility]   BREAST LUMPECTOMY Right 05/17/2018   BREAST LUMPECTOMY WITH RADIOACTIVE SEED LOCALIZATION Right 05/17/2018   Procedure: RIGHT BREAST LUMPECTOMY WITH RADIOACTIVE SEED LOCALIZATION;  Surgeon: Aron Shoulders, MD;  Location: [facility];  Service: General;  Laterality: Right;   CATARACT EXTRACTION W/ INTRAOCULAR LENS  IMPLANT, BILATERAL  2014   CHOLECYSTECTOMY  1985   COLONOSCOPY  last one 06-04-2013   CYSTOCELE REPAIR N/A 02/29/2016   Procedure: ANTERIOR VAULT PROLAPSE REPAIR COLOPLAST SACROSPINUS FIXATION AUGMENTED AXIS DERMIS REPAIR  ;  Surgeon: Arlena Gal, MD;  Location: [facility];  Service: Urology;  Laterality: N/A;   ESOPHAGOGASTRODUODENOSCOPY  08-31-2009   LAPAROSCOPIC ASSISTED VAGINAL HYSTERECTOMY  01-10-2000   w/ Bilateral Salpingoophorectomy /  Anterior & Posterior repair/  Pubovaginal Sling   MASTECTOMY Left 1992   SIMPLE MASTECTOMY WITH AXILLARY SENTINEL NODE BIOPSY Right 06/14/2023   Procedure: RIGHT MASTECTOMY WITH MAGTRACE INJECTION;  Surgeon: Aron Shoulders, MD;  Location: [facility];  Service: General;  Laterality: Right;  PEC BLOCK   TOTAL KNEE ARTHROPLASTY Right 05/21/2021   Procedure: RIGHT TOTAL KNEE ARTHROPLASTY;  Surgeon: Barbarann,  Oneil BROCKS, MD;  Location: [facility];  Service: Orthopedics;  Laterality: Right;   Patient Active Problem List   Diagnosis Date Noted   Anemia 09/21/2021   Routine general medical examination at a health care facility 09/21/2021   Hx of total knee arthroplasty, right 05/21/2021   Genetic testing 06/07/2018   Malignant neoplasm of upper-outer quadrant of right breast in female, estrogen receptor positive (HCC) 04/19/2018   Wears hearing aid    Wears glasses    Vaginal vault prolapse    Hiatal hernia    BPPV (benign paroxysmal positional vertigo)    Palpitations 10/31/2016   Radicular pain of right lower back 04/06/2015   Compression fracture of L3 vertebra (HCC)  04/06/2015   Senile osteoporosis 04/06/2015   DDD (degenerative disc disease), lumbar 04/06/2015   Hx of adenomatous colonic polyps 07/11/2013   Hyperlipidemia LDL goal <100    Bladder prolapse, female, acquired     PCP: Rollene Almarie LABOR, MD   REFERRING PROVIDER: Georgina Ozell LABOR, MD   REFERRING DIAG:  Diagnosis  R26.89 (ICD-10-CM) - Imbalance    THERAPY DIAG:  Balance problem  Muscle weakness (generalized)  Difficulty in walking, not elsewhere classified  Rationale for Evaluation and Treatment: Rehabilitation  ONSET DATE: ongoing  SUBJECTIVE:   SUBJECTIVE STATEMENT: Pt reporting she has scoliosis and a slipped disc in her back and states her back hurts all the time. Pt arriving to therapy reporting her main concern is her balance.   PERTINENT HISTORY:  HOH, skin melanoma, h/o breast CA, Left mastectomy, Rt lumpectomy, BPPV, asthma, see other PMH   PAIN:  NPRS scale: 7/10 Pain location: low back Pain description: achy Aggravating factors: bending, sitting, standing, lifting Relieving factors: lying in bed  PRECAUTIONS: None  WEIGHT BEARING RESTRICTIONS: No  FALLS:  Has patient fallen in last 6 months? No  LIVING ENVIRONMENT: Lives with: lives with their family Lives in: House/apartment Stairs: Yes: External: 3 steps; none, 1 flight, right hand rail Has following equipment at home: Single point cane  OCCUPATION: retired  PLOF: Independent  PATIENT GOALS: improve my balance  Next MD visit:   OBJECTIVE:   DIAGNOSTIC FINDINGS:  04/11/24:  XRs of the lumbar spine from 04/11/2024 were independently reviewed and  interpreted, showing lumbar degenerative scoliosis with apex to the left  at L2/3.  Disc height loss at L2/3 and L3/4.  No spondylolisthesis seen.   No evidence of instability on flexion/extension views.  Suspect an old L2  compression fracture but no other fracture seen.  No dislocation seen.   PATIENT SURVEYS:  Patient-Specific  Activity Scoring Scheme  0 represents "unable to perform." 10 represents "able to perform at prior level. 0 1 2 3 4 5 6 7 8 9  10 (Date and Score)   Activity Eval  04/29/24    1. Poor blance 3     2. Walking longer distances  5    3.     4.    5.    Score 4    Total score = sum of the activity scores/number of activities Minimum detectable change (90%CI) for average score = 2 points Minimum detectable change (90%CI) for single activity score = 3 points  COGNITION: Overall cognitive status: WFL    SENSATION: WFL    POSTURE:  rounded shoulders, forward head, and decreased lumbar lordosis    LOWER EXTREMITY ROM:   ROM Right Eval 04/29/24  Left Eval 04/29/24  Hip flexion 102 110  Hip extension  Hip abduction    Hip adduction    Hip internal rotation    Hip external rotation    Knee flexion    Knee extension    Ankle dorsiflexion    Ankle plantarflexion    Ankle inversion    Ankle eversion     (Blank rows = not tested)  LOWER EXTREMITY MMT:  MMT Right Eval 04/29/24 Left Eval 04/29/24  Hip flexion 23.3# 18.7#  Hip extension    Hip abduction    Hip adduction    Hip internal rotation    Hip external rotation    Knee flexion 23.9# 24.4#  Knee extension 30.3# 26.7#  Ankle dorsiflexion    Ankle plantarflexion    Ankle inversion    Ankle eversion     (Blank rows = not tested)    FUNCTIONAL TESTS:  04/29/24:  TUG: 15.54 seconds no device 5 time sit to stand: 19.1 seconds no UE support BERG: 44/56   [facility] PT Assessment - 04/29/24 0001       Balance   Balance Assessed Yes      Standardized Balance Assessment   Standardized Balance Assessment Berg Balance Test      Berg Balance Test   Sit to Stand Able to stand without using hands and stabilize independently    Standing Unsupported Able to stand safely 2 minutes    Sitting with Back Unsupported but Feet Supported on Floor or Stool Able to sit safely and securely 2 minutes    Stand to Sit  Sits safely with minimal use of hands    Transfers Able to transfer safely, definite need of hands    Standing Unsupported with Eyes Closed Able to stand 10 seconds safely    Standing Unsupported with Feet Together Able to place feet together independently and stand 1 minute safely    From Standing, Reach Forward with Outstretched Arm Can reach forward >12 cm safely (5)    From Standing Position, Pick up Object from Floor Able to pick up shoe, needs supervision    From Standing Position, Turn to Look Behind Over each Shoulder Looks behind one side only/other side shows less weight shift    Turn 360 Degrees Able to turn 360 degrees safely but slowly    Standing Unsupported, Alternately Place Feet on Step/Stool Able to complete 4 steps without aid or supervision    Standing Unsupported, One Foot in Front Able to plae foot ahead of the other independently and hold 30 seconds    Standing on One Leg Tries to lift leg/unable to hold 3 seconds but remains standing independently    Total Score 44           GAIT: Distance walked: clinic distance Assistive device utilized: Single point cane Level of assistance: Modified independence Comments: scoliosis  TODAY'S TREATMENT                                                                          DATE: 04/29/24 Therex:    HEP instruction/performance c cues for techniques, handout provided.  Trial set performed of each for comprehension and symptom assessment.  See below for exercise list  PATIENT EDUCATION:  Education details: HEP, POC Person educated: Patient Education method: Explanation, Demonstration, Verbal cues, and Handouts Education comprehension: verbalized understanding, returned demonstration, and verbal cues required  HOME EXERCISE PROGRAM: Access Code:  [email or web address] URL: [email or web address] Date: 04/29/2024 Prepared by: Delon Lunger  Exercises - Supine Bridge  - 2 x daily - 7 x weekly - 3 sets - 10 reps - Seated Straight Leg Raise   - 2 x daily - 7 x weekly - 10 reps - Supine Lower Trunk Rotation  - 2 x daily - 7 x weekly - 3 reps - 20 seconds hold - Sit to Stand with Counter Support  - 2 x daily - 7 x weekly - 2 sets - 10 reps  ASSESSMENT:  CLINICAL IMPRESSION: Patient is a 89 y.o. who comes to clinic with complaints of  imbalance with low back pain noted. Pt presents with mobility, strength and movement coordination deficits that impair their ability to perform usual daily and recreational functional activities without increase difficulty/symptoms at this time.  Patient to benefit from skilled PT services to address impairments and limitations to improve to previous level of function without restriction secondary to condition.   OBJECTIVE IMPAIRMENTS: decreased balance, decreased mobility, difficulty walking, decreased ROM, impaired flexibility, postural dysfunction, and pain.   ACTIVITY LIMITATIONS: lifting, bending, standing, squatting, and stairs  PARTICIPATION LIMITATIONS: cleaning, laundry, shopping, and community activity  PERSONAL FACTORS: see PMH above are also affecting patient's functional outcome.   REHAB POTENTIAL: Good  CLINICAL DECISION MAKING: Stable/uncomplicated  EVALUATION COMPLEXITY: Low   GOALS: Goals reviewed with patient? Yes  SHORT TERM GOALS: (target date for Short term goals are 3 weeks 05/20/2024)   1.  Patient will demonstrate independent use of home exercise program to maintain progress from in clinic treatments.  Goal status: New  LONG TERM GOALS: (target dates for all long term goals are 10 weeks  07/12/2024 )   1. Patient will demonstrate/report pain at worst less than or equal to 2/10 to facilitate minimal limitation in daily activity secondary to pain symptoms.  Goal  status: New   2. Patient will demonstrate independent use of home exercise program to facilitate ability to maintain/progress functional gains from skilled physical therapy services.  Goal status: New   3. Patient will demonstrate Patient specific functional scale avg > or = 6 to indicate reduced disability due to condition.   Goal status: New   4.  Patient will demonstrate bilateral  LE MMT 5/5 throughout to faciltiate usual transfers, stairs, squatting at [facility] for daily life.   Goal status: New   5.  Patient will demonstrate TUG in </= 13 seconds with no UE support Goal status: New   6.  Pt will improve BERG balance score to >/=50.  Goal status: New      PLAN:  PT FREQUENCY: 1-2x/week  PT DURATION: 10 weeks  PLANNED INTERVENTIONS: Can include 02853- PT Re-evaluation, 97110-Therapeutic exercises, 97530- Therapeutic activity, [id number]- Neuromuscular re-education, [phone]- Self Care, 97140- Manual therapy, [phone]- Gait training, [phone]- Orthotic Fit/training, [phone]- Canalith repositioning, [id number]- Aquatic Therapy, [phone]- Electrical stimulation (unattended), [id number] Physical performance testing, 97016- Vasopneumatic device, [id number]- Ultrasound, [id number]- Traction (mechanical), [id number]- Ionotophoresis 4mg /ml Dexamethasone ,  79439 - Needle insertion w/o injection 1 or 2 muscles, 20561 - Needle insertion w/o injection 3 or more muscles.   Patient/Family education, Balance training, Stair training, Taping, Dry Needling, Joint mobilization, Joint manipulation, Spinal manipulation, Spinal mobilization, Scar mobilization, Vestibular training, Visual/preceptual remediation/compensation, DME instructions, Cryotherapy, and Moist heat.  All performed as medically necessary.  All included unless contraindicated  PLAN FOR NEXT SESSION: Review HEP knowledge/results.  Work on standing balance activities, LE strengthening     Delon JONELLE Lunger, PT, MPT 04/29/2024, 5:27 PM   Referring diagnosis?  Diagnosis   R26.89 (ICD-10-CM) - Imbalance   Treatment diagnosis? (if different than referring diagnosis) R26.89, M62.81, R26.2 What was this (referring dx) caused by? []  Surgery []  Fall [x]  Ongoing issue []  Arthritis []  Other: ____________  Laterality: []  Rt []  Lt [x]  Both  Check all possible CPT codes:  *CHOOSE 10 OR LESS*     97146- PT Re-evaluation,  97110-Therapeutic exercises,  97530- Therapeutic activity,  [id number]- Neuromuscular re-education,  97535- Self Care,  97140- Manual therapy,  [id number]- Gait training,   [id number] Physical performance testing,

## 2024-04-30 DIAGNOSIS — N8182 Incompetence or weakening of pubocervical tissue: Secondary | ICD-10-CM | POA: Diagnosis not present

## 2024-05-07 ENCOUNTER — Other Ambulatory Visit: Payer: Self-pay

## 2024-05-07 ENCOUNTER — Ambulatory Visit: Admitting: Physical Medicine and Rehabilitation

## 2024-05-07 VITALS — BP 164/93 | HR 77

## 2024-05-07 DIAGNOSIS — R3914 Feeling of incomplete bladder emptying: Secondary | ICD-10-CM | POA: Diagnosis not present

## 2024-05-07 DIAGNOSIS — N390 Urinary tract infection, site not specified: Secondary | ICD-10-CM | POA: Diagnosis not present

## 2024-05-07 DIAGNOSIS — M47816 Spondylosis without myelopathy or radiculopathy, lumbar region: Secondary | ICD-10-CM

## 2024-05-07 MED ORDER — BUPIVACAINE HCL 0.5 % IJ SOLN
3.0000 mL | Freq: Once | INTRAMUSCULAR | Status: AC
  Administered 2024-05-07: 3 mL

## 2024-05-07 NOTE — Progress Notes (Signed)
 Rebecca Guzman - 89 y.o. female MRN [id number]  Date of birth: 05-01-1934  Office Visit Note: Visit Date: 05/07/2024 PCP: Rollene Almarie LABOR, MD Referred by: Georgina Ozell LABOR, MD  Subjective: Chief Complaint  Patient presents with   Lower Back - Pain   HPI:  Rebecca Guzman is a 89 y.o. female who comes in today at the request of Dr. Ozell Georgina for planned Bilateral  L4-5 and L5-S1 Lumbar facet/medial branch block with fluoroscopic guidance.  The patient has failed conservative care including home exercise, medications, time and activity modification.  This injection will be diagnostic and hopefully therapeutic.  Please see requesting physician notes for further details and justification.  Exam has shown concordant pain with facet joint loading.   ROS Otherwise per HPI.  Assessment & Plan: Visit Diagnoses:    ICD-10-CM   1. Spondylosis without myelopathy or radiculopathy, lumbar region  M47.816 XR C-ARM NO REPORT    Facet Injection    bupivacaine  (MARCAINE ) 0.5 % (with pres) injection 3 mL      Plan: No additional findings.   Meds & Orders:  Meds ordered this encounter  Medications   bupivacaine  (MARCAINE ) 0.5 % (with pres) injection 3 mL    Orders Placed This Encounter  Procedures   Facet Injection   XR C-ARM NO REPORT    Follow-up: Return for Review Pain Diary.   Procedures: No procedures performed  Lumbar Diagnostic Facet Joint Nerve Block with Fluoroscopic Guidance   Patient: Rebecca Guzman      Date of Birth: [birth year] MRN: [id number] PCP: Rollene Almarie LABOR, MD      Visit Date: 05/07/2024   Universal Protocol:    Date/Time: 09/30/258:41 PM  Consent Given By: the patient  Position: PRONE  Additional Comments: Vital signs were monitored before and after the procedure. Patient was prepped and draped in the usual sterile fashion. The correct patient, procedure, and site was verified.   Injection Procedure Details:   Procedure diagnoses:   1. Spondylosis without myelopathy or radiculopathy, lumbar region      Meds Administered:  Meds ordered this encounter  Medications   bupivacaine  (MARCAINE ) 0.5 % (with pres) injection 3 mL     Laterality: Bilateral  Location/Site: L4-L5, L3 and L4 medial branches and L5-S1, L4 medial branch and L5 dorsal ramus  Needle: 5.0 in., 25 ga.  Short bevel or Quincke spinal needle  Needle Placement: Oblique pedical  Findings:   -Comments: There was excellent flow of contrast along the articular pillars without intravascular flow.  Procedure Details: The fluoroscope beam is vertically oriented in AP and then obliqued 15 to 20 degrees to the ipsilateral side of the desired nerve to achieve the "Scotty dog" appearance.  The skin over the target area of the junction of the superior articulating process and the transverse process (sacral ala if blocking the L5 dorsal rami) was locally anesthetized with a 1 ml volume of 1% Lidocaine  without Epinephrine .  The spinal needle was inserted and advanced in a trajectory view down to the target.   After contact with periosteum and negative aspirate for blood and CSF, correct placement without intravascular or epidural spread was confirmed by injecting 0.5 ml. of Isovue-250.  A spot radiograph was obtained of this image.    Next, a 0.5 ml. volume of the injectate described above was injected. The needle was then redirected to the other facet joint nerves mentioned above if needed.  Prior to the procedure, the patient was given  a Pain Diary which was completed for baseline measurements.  After the procedure, the patient rated their pain every 30 minutes and will continue rating at this frequency for a total of 5 hours.  The patient has been asked to complete the Diary and return to us  by mail, fax or hand delivered as soon as possible.   Additional Comments:  The patient tolerated the procedure well Dressing: 2 x 2 sterile gauze and Band-Aid     Post-procedure details: Patient was observed during the procedure. Post-procedure instructions were reviewed.  Patient left the clinic in stable condition.   Clinical History: Imaging: XRs of the lumbar spine from 04/11/2024 were independently reviewed and interpreted, showing lumbar degenerative scoliosis with apex to the left at L2/3.  Disc height loss at L2/3 and L3/4.  No spondylolisthesis seen.  No evidence of instability on flexion/extension views.  Suspect an old L2 compression fracture but no other fracture seen.  No dislocation seen.     Objective:  VS:  HT:    WT:   BMI:     BP:(!) 164/93  HR:77bpm  TEMP: ( )  RESP:  Physical Exam Vitals and nursing note reviewed.  Constitutional:      General: She is not in acute distress.    Appearance: Normal appearance. She is not ill-appearing.  HENT:     Head: Normocephalic and atraumatic.     Right Ear: External ear normal.     Left Ear: External ear normal.  Eyes:     Extraocular Movements: Extraocular movements intact.  Cardiovascular:     Rate and Rhythm: Normal rate.     Pulses: Normal pulses.  Pulmonary:     Effort: Pulmonary effort is normal. No respiratory distress.  Abdominal:     General: There is no distension.     Palpations: Abdomen is soft.  Musculoskeletal:        General: Tenderness present.     Cervical back: Neck supple.     Right lower leg: No edema.     Left lower leg: No edema.     Comments: Patient has good distal strength with no pain over the greater trochanters.  No clonus or focal weakness.  Skin:    Findings: No erythema, lesion or rash.  Neurological:     General: No focal deficit present.     Mental Status: She is alert and oriented to person, place, and time.     Sensory: No sensory deficit.     Motor: No weakness or abnormal muscle tone.     Coordination: Coordination normal.  Psychiatric:        Mood and Affect: Mood normal.        Behavior: Behavior normal.      Imaging: XR  C-ARM NO REPORT Result Date: 05/07/2024 Please see Notes tab for imaging impression.

## 2024-05-07 NOTE — Progress Notes (Signed)
 Pain Scale   Average Pain 5 Patient advising she has chronic lower back pain that is constant without relief.        +Driver, -BT, -Dye Allergies.

## 2024-05-07 NOTE — Procedures (Signed)
 Lumbar Diagnostic Facet Joint Nerve Block with Fluoroscopic Guidance   Patient: Rebecca Guzman      Date of Birth: April 05, 1934 MRN: [id number] PCP: Rollene Almarie LABOR, MD      Visit Date: 05/07/2024   Universal Protocol:    Date/Time: 09/30/258:41 PM  Consent Given By: the patient  Position: PRONE  Additional Comments: Vital signs were monitored before and after the procedure. Patient was prepped and draped in the usual sterile fashion. The correct patient, procedure, and site was verified.   Injection Procedure Details:   Procedure diagnoses:  1. Spondylosis without myelopathy or radiculopathy, lumbar region      Meds Administered:  Meds ordered this encounter  Medications   bupivacaine  (MARCAINE ) 0.5 % (with pres) injection 3 mL     Laterality: Bilateral  Location/Site: L4-L5, L3 and L4 medial branches and L5-S1, L4 medial branch and L5 dorsal ramus  Needle: 5.0 in., 25 ga.  Short bevel or Quincke spinal needle  Needle Placement: Oblique pedical  Findings:   -Comments: There was excellent flow of contrast along the articular pillars without intravascular flow.  Procedure Details: The fluoroscope beam is vertically oriented in AP and then obliqued 15 to 20 degrees to the ipsilateral side of the desired nerve to achieve the "Scotty dog" appearance.  The skin over the target area of the junction of the superior articulating process and the transverse process (sacral ala if blocking the L5 dorsal rami) was locally anesthetized with a 1 ml volume of 1% Lidocaine  without Epinephrine .  The spinal needle was inserted and advanced in a trajectory view down to the target.   After contact with periosteum and negative aspirate for blood and CSF, correct placement without intravascular or epidural spread was confirmed by injecting 0.5 ml. of Isovue-250.  A spot radiograph was obtained of this image.    Next, a 0.5 ml. volume of the injectate described above was injected. The  needle was then redirected to the other facet joint nerves mentioned above if needed.  Prior to the procedure, the patient was given a Pain Diary which was completed for baseline measurements.  After the procedure, the patient rated their pain every 30 minutes and will continue rating at this frequency for a total of 5 hours.  The patient has been asked to complete the Diary and return to us  by mail, fax or hand delivered as soon as possible.   Additional Comments:  The patient tolerated the procedure well Dressing: 2 x 2 sterile gauze and Band-Aid    Post-procedure details: Patient was observed during the procedure. Post-procedure instructions were reviewed.  Patient left the clinic in stable condition.

## 2024-05-09 ENCOUNTER — Other Ambulatory Visit: Payer: Self-pay | Admitting: Physical Medicine and Rehabilitation

## 2024-05-09 ENCOUNTER — Ambulatory Visit: Admitting: Rehabilitative and Restorative Service Providers"

## 2024-05-09 ENCOUNTER — Encounter: Payer: Self-pay | Admitting: Rehabilitative and Restorative Service Providers"

## 2024-05-09 DIAGNOSIS — M6281 Muscle weakness (generalized): Secondary | ICD-10-CM | POA: Diagnosis not present

## 2024-05-09 DIAGNOSIS — G8929 Other chronic pain: Secondary | ICD-10-CM

## 2024-05-09 DIAGNOSIS — R2689 Other abnormalities of gait and mobility: Secondary | ICD-10-CM | POA: Diagnosis not present

## 2024-05-09 DIAGNOSIS — M47816 Spondylosis without myelopathy or radiculopathy, lumbar region: Secondary | ICD-10-CM

## 2024-05-09 DIAGNOSIS — R262 Difficulty in walking, not elsewhere classified: Secondary | ICD-10-CM | POA: Diagnosis not present

## 2024-05-09 NOTE — Progress Notes (Signed)
 Per pain diary, patient received 100% relief with the first set of diagnostic bilateral L4-L5 and L5-S1 medial branch blocks.  We will proceed with second set of diagnostic blocks and then hopefully radiofrequency ablation.

## 2024-05-09 NOTE — Therapy (Signed)
 OUTPATIENT PHYSICAL THERAPY TREATMENT   Patient Name: Rebecca Guzman MRN: [id number] DOB:09/17/1933, 89 y.o., female Today's Date: 05/09/2024  END OF SESSION:  PT End of Session - 05/09/24 1350     Visit Number 2    Number of Visits 11    Date for Recertification  07/12/24    Authorization Type [employer] $20 copay    Authorization Time Period 04/29/2024 - 07/12/2024    Authorization - Visit Number 2    Authorization - Number of Visits 10    Progress Note Due on Visit 10    PT Start Time 1342    Activity Tolerance Patient tolerated treatment well    Behavior During Therapy [facility] for tasks assessed/performed           Past Medical History:  Diagnosis Date   Asthma    BPPV (benign paroxysmal positional vertigo)    Breast cancer (HCC)    left mastectomy 1992, right lumpectomy 2019   Cancer (HCC)    Compression fracture of L3 lumbar vertebra 04/06/2015   Dysrhythmia 2019   pt states Cardiologist told her it is pre A-fib   Family history of adverse reaction to anesthesia    daughter had throat close up with anesthesia   Family history of breast cancer    Family history of colon cancer    Family history of pancreatic cancer    GERD (gastroesophageal reflux disease)    Hiatal hernia    pt states she doesnt have this   History of adenomatous polyp of colon    tubular adenoma 2005 and  2014 tubular adenoma and  hyperplastic polyp   History of breast cancer per pt no recurrence   dx 1992 --  s/p  left breast mastectomy and chemotherapy   Hyperlipidemia LDL goal <100    Osteoarthritis of right knee    Personal history of chemotherapy 1992   PONV (postoperative nausea and vomiting)    and gets weak and light-leaded   Sensation of pressure in bladder area    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   Vaginal vault prolapse    anterior   Wears glasses    Wears hearing aid    left only   Past Surgical History:  Procedure Laterality Date   BREAST BIOPSY Right 04/19/2018   BREAST  BIOPSY Right 05/15/2023   MM RT BREAST BX W LOC DEV 1ST LESION IMAGE BX SPEC STEREO GUIDE 05/15/2023 [facility]   BREAST LUMPECTOMY Right 05/17/2018   BREAST LUMPECTOMY WITH RADIOACTIVE SEED LOCALIZATION Right 05/17/2018   Procedure: RIGHT BREAST LUMPECTOMY WITH RADIOACTIVE SEED LOCALIZATION;  Surgeon: Aron Shoulders, MD;  Location: [facility] OR;  Service: General;  Laterality: Right;   CATARACT EXTRACTION W/ INTRAOCULAR LENS  IMPLANT, BILATERAL  2014   CHOLECYSTECTOMY  1985   COLONOSCOPY  last one 06-04-2013   CYSTOCELE REPAIR N/A 02/29/2016   Procedure: ANTERIOR VAULT PROLAPSE REPAIR COLOPLAST SACROSPINUS FIXATION AUGMENTED AXIS DERMIS REPAIR  ;  Surgeon: Arlena Gal, MD;  Location: [facility];  Service: Urology;  Laterality: N/A;   ESOPHAGOGASTRODUODENOSCOPY  08-31-2009   LAPAROSCOPIC ASSISTED VAGINAL HYSTERECTOMY  01-10-2000   w/ Bilateral Salpingoophorectomy /  Anterior & Posterior repair/  Pubovaginal Sling   MASTECTOMY Left 1992   SIMPLE MASTECTOMY WITH AXILLARY SENTINEL NODE BIOPSY Right 06/14/2023   Procedure: RIGHT MASTECTOMY WITH MAGTRACE INJECTION;  Surgeon: Aron Shoulders, MD;  Location: [facility];  Service: General;  Laterality: Right;  PEC BLOCK   TOTAL KNEE ARTHROPLASTY Right  05/21/2021   Procedure: RIGHT TOTAL KNEE ARTHROPLASTY;  Surgeon: Barbarann Oneil BROCKS, MD;  Location: [facility] OR;  Service: Orthopedics;  Laterality: Right;   Patient Active Problem List   Diagnosis Date Noted   Anemia 09/21/2021   Routine general medical examination at a health care facility 09/21/2021   Hx of total knee arthroplasty, right 05/21/2021   Genetic testing 06/07/2018   Malignant neoplasm of upper-outer quadrant of right breast in female, estrogen receptor positive (HCC) 04/19/2018   Wears hearing aid    Wears glasses    Vaginal vault prolapse    Hiatal hernia    BPPV (benign paroxysmal positional vertigo)    Palpitations 10/31/2016   Radicular pain of right  lower back 04/06/2015   Compression fracture of L3 vertebra (HCC) 04/06/2015   Senile osteoporosis 04/06/2015   DDD (degenerative disc disease), lumbar 04/06/2015   Hx of adenomatous colonic polyps 07/11/2013   Hyperlipidemia LDL goal <100    Bladder prolapse, female, acquired     PCP: Rollene Almarie LABOR, MD   REFERRING PROVIDER: Georgina Ozell LABOR, MD   REFERRING DIAG:  Diagnosis  R26.89 (ICD-10-CM) - Imbalance    THERAPY DIAG:  Muscle weakness (generalized)  Difficulty in walking, not elsewhere classified  Balance problem  Rationale for Evaluation and Treatment: Rehabilitation  ONSET DATE: ongoing  SUBJECTIVE:   SUBJECTIVE STATEMENT: Pt indicated no pain upon arrival .  Indicated having ablasion in back since last visit.  Reported waking at night most nights with cramps in both legs.   PERTINENT HISTORY:  HOH, skin melanoma, h/o breast CA, Left mastectomy, Rt lumpectomy, BPPV, asthma, see other PMH   PAIN:  NPRS scale: 0/10 Pain location: low back Pain description: achy Aggravating factors: bending, sitting, standing, lifting Relieving factors: lying in bed  PRECAUTIONS: None  WEIGHT BEARING RESTRICTIONS: No  FALLS:  Has patient fallen in last 6 months? No  LIVING ENVIRONMENT: Lives with: lives with their family Lives in: House/apartment Stairs: Yes: External: 3 steps; none, 1 flight, right hand rail Has following equipment at home: Single point cane  OCCUPATION: retired  PLOF: Independent  PATIENT GOALS: improve my balance   OBJECTIVE:   DIAGNOSTIC FINDINGS:  04/11/24:  XRs of the lumbar spine from 04/11/2024 were independently reviewed and  interpreted, showing lumbar degenerative scoliosis with apex to the left  at L2/3.  Disc height loss at L2/3 and L3/4.  No spondylolisthesis seen.   No evidence of instability on flexion/extension views.  Suspect an old L2  compression fracture but no other fracture seen.  No dislocation seen.    PATIENT SURVEYS:  Patient-Specific Activity Scoring Scheme  0 represents "unable to perform." 10 represents "able to perform at prior level. 0 1 2 3 4 5 6 7 8 9  10 (Date and Score)   Activity Eval  04/29/24    1. Poor blance 3     2. Walking longer distances  5    3.     4.    5.    Score 4    Total score = sum of the activity scores/number of activities Minimum detectable change (90%CI) for average score = 2 points Minimum detectable change (90%CI) for single activity score = 3 points  COGNITION: 04/29/24 Overall cognitive status: WFL    SENSATION: 04/29/24 WFL  POSTURE:  04/29/24 rounded shoulders, forward head, and decreased lumbar lordosis   LOWER EXTREMITY ROM:   ROM Right Eval 04/29/24  Left Eval 04/29/24  Hip flexion 102 110  Hip extension    Hip abduction    Hip adduction    Hip internal rotation    Hip external rotation    Knee flexion    Knee extension    Ankle dorsiflexion    Ankle plantarflexion    Ankle inversion    Ankle eversion     (Blank rows = not tested)  LOWER EXTREMITY MMT:  MMT Right Eval 04/29/24 Left Eval 04/29/24  Hip flexion 23.3# 18.7#  Hip extension    Hip abduction    Hip adduction    Hip internal rotation    Hip external rotation    Knee flexion 23.9# 24.4#  Knee extension 30.3# 26.7#  Ankle dorsiflexion    Ankle plantarflexion    Ankle inversion    Ankle eversion     (Blank rows = not tested)    FUNCTIONAL TESTS:  05/09/2024: 5x sit to stand s UE assist:  16.05 seconds  04/29/24:  TUG: 15.54 seconds no device 5 time sit to stand: 19.1 seconds no UE support BERG: 44/56 [facility] PT Assessment - 04/29/24 0001                Balance    Balance Assessed Yes          Standardized Balance Assessment    Standardized Balance Assessment Berg Balance Test          Berg Balance Test    Sit to Stand Able to stand without using hands and stabilize independently     Standing Unsupported Able to stand safely 2  minutes     Sitting with Back Unsupported but Feet Supported on Floor or Stool Able to sit safely and securely 2 minutes     Stand to Sit Sits safely with minimal use of hands     Transfers Able to transfer safely, definite need of hands     Standing Unsupported with Eyes Closed Able to stand 10 seconds safely     Standing Unsupported with Feet Together Able to place feet together independently and stand 1 minute safely     From Standing, Reach Forward with Outstretched Arm Can reach forward >12 cm safely (5)     From Standing Position, Pick up Object from Floor Able to pick up shoe, needs supervision     From Standing Position, Turn to Look Behind Over each Shoulder Looks behind one side only/other side shows less weight shift     Turn 360 Degrees Able to turn 360 degrees safely but slowly     Standing Unsupported, Alternately Place Feet on Step/Stool Able to complete 4 steps without aid or supervision     Standing Unsupported, One Foot in Front Able to plae foot ahead of the other independently and hold 30 seconds     Standing on One Leg Tries to lift leg/unable to hold 3 seconds but remains standing independently     Total Score 44      GAIT: 04/29/24: Distance walked: clinic distance Assistive device utilized: Single point cane Level of assistance: Modified independence Comments: scoliosis  TODAY'S TREATMENT                                                                          DATE: 05/09/2024 Therex: Nustep UE/LE for ROM, aerobic intervention lvl 5 8 mins  Standing PF/DF alternating with hands on rail 2 x 10  Seated green band hip abduction with contralateral leg isometric hold 2 x 15 bilateral  Seated SLR x 10 bilateral with slow movement control focus.    Neuro Re-ed (balance improvement, control) Feet  together stance EO 30 seconds, EC 30 seconds with SBA Modified tandem stance foot forward 1 min x 1 bilateral with SBA [place] pew anterior/posterior ankle strategy weight shift x 20 each direction with SBA Feet together stance on foam 1 min, with head turns Lt and Rt x 10 each way with SBA Feet together stance on foam with external perturbations by clinician x 20 various directions.   Time spent in education of techniques.      TODAY'S TREATMENT                                                                          DATE: 04/29/24 Therex:    HEP instruction/performance c cues for techniques, handout provided.  Trial set performed of each for comprehension and symptom assessment.  See below for exercise list  PATIENT EDUCATION:  Education details: HEP, POC Person educated: Patient Education method: Explanation, Demonstration, Verbal cues, and Handouts Education comprehension: verbalized understanding, returned demonstration, and verbal cues required  HOME EXERCISE PROGRAM: Access Code: [email or web address] URL: [email or web address] Date: 04/29/2024 Prepared by: Delon Lunger  Exercises - Supine Bridge  - 2 x daily - 7 x weekly - 3 sets - 10 reps - Seated Straight Leg Raise   - 2 x daily - 7 x weekly - 10 reps - Supine Lower Trunk Rotation  - 2 x daily - 7 x weekly - 3 reps - 20 seconds hold - Sit to Stand with Counter Support  - 2 x daily - 7 x weekly - 2 sets - 10 reps  ASSESSMENT:  CLINICAL IMPRESSION: Overall good performance on early static balance challenges on non compliant surface.  May continue to benefit from dynamic balance control, compliant surface balance improvements.  Continued skilled PT services indicated at this time.   OBJECTIVE IMPAIRMENTS: decreased balance, decreased mobility, difficulty walking, decreased ROM, impaired flexibility, postural dysfunction, and pain.   ACTIVITY LIMITATIONS: lifting, bending, standing, squatting, and  stairs  PARTICIPATION LIMITATIONS: cleaning, laundry, shopping, and community activity  PERSONAL FACTORS: see PMH above are also affecting patient's functional outcome.   REHAB POTENTIAL: Good  CLINICAL DECISION MAKING: Stable/uncomplicated  EVALUATION COMPLEXITY: Low   GOALS: Goals reviewed with patient? Yes  SHORT TERM GOALS: (target date for Short term goals are 3 weeks 05/20/2024)   1.  Patient will demonstrate independent use of home exercise program to maintain progress from in clinic treatments.  Goal status: on going 05/09/2024  LONG  TERM GOALS: (target dates for all long term goals are 10 weeks  07/12/2024 )   1. Patient will demonstrate/report pain at worst less than or equal to 2/10 to facilitate minimal limitation in daily activity secondary to pain symptoms.  Goal status: New   2. Patient will demonstrate independent use of home exercise program to facilitate ability to maintain/progress functional gains from skilled physical therapy services.  Goal status: New   3. Patient will demonstrate Patient specific functional scale avg > or = 6 to indicate reduced disability due to condition.   Goal status: New   4.  Patient will demonstrate bilateral  LE MMT 5/5 throughout to faciltiate usual transfers, stairs, squatting at [facility] for daily life.   Goal status: New   5.  Patient will demonstrate TUG in </= 13 seconds with no UE support Goal status: New   6.  Pt will improve BERG balance score to >/=50.  Goal status: New      PLAN:  PT FREQUENCY: 1-2x/week  PT DURATION: 10 weeks  PLANNED INTERVENTIONS: Can include 02853- PT Re-evaluation, 97110-Therapeutic exercises, 97530- Therapeutic activity, [id number]- Neuromuscular re-education, 97535- Self Care, 97140- Manual therapy, [phone]- Gait training, [phone]- Orthotic Fit/training, [phone]- Canalith repositioning, [id number]- Aquatic Therapy, [phone]- Electrical stimulation (unattended), [id number] Physical performance testing, 97016-  Vasopneumatic device, [id number]- Ultrasound, [id number]- Traction (mechanical), [id number]- Ionotophoresis 4mg /ml Dexamethasone ,  79439 - Needle insertion w/o injection 1 or 2 muscles, 20561 - Needle insertion w/o injection 3 or more muscles.   Patient/Family education, Balance training, Stair training, Taping, Dry Needling, Joint mobilization, Joint manipulation, Spinal manipulation, Spinal mobilization, Scar mobilization, Vestibular training, Visual/preceptual remediation/compensation, DME instructions, Cryotherapy, and Moist heat.  All performed as medically necessary.  All included unless contraindicated  PLAN FOR NEXT SESSION: Continue progression into compliant balance and dynamic balance challenges. Back intervention based off symptom presentation .   Ozell Silvan, PT, DPT, OCS, ATC 05/09/24  2:19 PM    Referring diagnosis?  Diagnosis  R26.89 (ICD-10-CM) - Imbalance   Treatment diagnosis? (if different than referring diagnosis) R26.89, M62.81, R26.2 What was this (referring dx) caused by? []  Surgery []  Fall [x]  Ongoing issue []  Arthritis []  Other: ____________  Laterality: []  Rt []  Lt [x]  Both  Check all possible CPT codes:  *CHOOSE 10 OR LESS*     97146- PT Re-evaluation,  97110-Therapeutic exercises,  97530- Therapeutic activity,  [id number]- Neuromuscular re-education,  97535- Self Care,  97140- Manual therapy,  [id number]- Gait training,   [id number] Physical performance testing,

## 2024-05-14 ENCOUNTER — Encounter: Payer: Self-pay | Admitting: Physical Therapy

## 2024-05-14 ENCOUNTER — Ambulatory Visit: Admitting: Physical Therapy

## 2024-05-14 DIAGNOSIS — R262 Difficulty in walking, not elsewhere classified: Secondary | ICD-10-CM | POA: Diagnosis not present

## 2024-05-14 DIAGNOSIS — R2681 Unsteadiness on feet: Secondary | ICD-10-CM

## 2024-05-14 DIAGNOSIS — M6281 Muscle weakness (generalized): Secondary | ICD-10-CM

## 2024-05-14 NOTE — Therapy (Signed)
 OUTPATIENT PHYSICAL THERAPY TREATMENT   Patient Name: Rebecca Guzman MRN: [id number] DOB:[birth year], 89 y.o., female Today's Date: 05/14/2024  END OF SESSION:  PT End of Session - 05/14/24 1501     Visit Number 3    Number of Visits 11    Date for Recertification  07/12/24    Authorization Type [employer] $20 copay    Authorization Time Period 04/29/2024 - 07/12/2024    Authorization - Visit Number 3    Authorization - Number of Visits 10    Progress Note Due on Visit 10    PT Start Time 1430    PT Stop Time 1510    PT Time Calculation (min) 40 min    Behavior During Therapy [facility] for tasks assessed/performed            Past Medical History:  Diagnosis Date   Asthma    BPPV (benign paroxysmal positional vertigo)    Breast cancer (HCC)    left mastectomy 1992, right lumpectomy 2019   Cancer (HCC)    Compression fracture of L3 lumbar vertebra 04/06/2015   Dysrhythmia 2019   pt states Cardiologist told her it is pre A-fib   Family history of adverse reaction to anesthesia    daughter had throat close up with anesthesia   Family history of breast cancer    Family history of colon cancer    Family history of pancreatic cancer    GERD (gastroesophageal reflux disease)    Hiatal hernia    pt states she doesnt have this   History of adenomatous polyp of colon    tubular adenoma 2005 and  2014 tubular adenoma and  hyperplastic polyp   History of breast cancer per pt no recurrence   dx 1992 --  s/p  left breast mastectomy and chemotherapy   Hyperlipidemia LDL goal <100    Osteoarthritis of right knee    Personal history of chemotherapy 1992   PONV (postoperative nausea and vomiting)    and gets weak and light-leaded   Sensation of pressure in bladder area    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   Vaginal vault prolapse    anterior   Wears glasses    Wears hearing aid    left only   Past Surgical History:  Procedure Laterality Date   BREAST BIOPSY Right 04/19/2018    BREAST BIOPSY Right 05/15/2023   MM RT BREAST BX W LOC DEV 1ST LESION IMAGE BX SPEC STEREO GUIDE 05/15/2023 [facility]   BREAST LUMPECTOMY Right 05/17/2018   BREAST LUMPECTOMY WITH RADIOACTIVE SEED LOCALIZATION Right 05/17/2018   Procedure: RIGHT BREAST LUMPECTOMY WITH RADIOACTIVE SEED LOCALIZATION;  Surgeon: Aron Shoulders, MD;  Location: [facility];  Service: General;  Laterality: Right;   CATARACT EXTRACTION W/ INTRAOCULAR LENS  IMPLANT, BILATERAL  2014   CHOLECYSTECTOMY  1985   COLONOSCOPY  last one 06-04-2013   CYSTOCELE REPAIR N/A 02/29/2016   Procedure: ANTERIOR VAULT PROLAPSE REPAIR COLOPLAST SACROSPINUS FIXATION AUGMENTED AXIS DERMIS REPAIR  ;  Surgeon: Arlena Gal, MD;  Location: [facility];  Service: Urology;  Laterality: N/A;   ESOPHAGOGASTRODUODENOSCOPY  08-31-2009   LAPAROSCOPIC ASSISTED VAGINAL HYSTERECTOMY  01-10-2000   w/ Bilateral Salpingoophorectomy /  Anterior & Posterior repair/  Pubovaginal Sling   MASTECTOMY Left 1992   SIMPLE MASTECTOMY WITH AXILLARY SENTINEL NODE BIOPSY Right 06/14/2023   Procedure: RIGHT MASTECTOMY WITH MAGTRACE INJECTION;  Surgeon: Aron Shoulders, MD;  Location: [facility];  Service: General;  Laterality: Right;  PEC BLOCK   TOTAL KNEE ARTHROPLASTY Right 05/21/2021   Procedure: RIGHT TOTAL KNEE ARTHROPLASTY;  Surgeon: Barbarann Oneil BROCKS, MD;  Location: [facility] OR;  Service: Orthopedics;  Laterality: Right;   Patient Active Problem List   Diagnosis Date Noted   Anemia 09/21/2021   Routine general medical examination at a health care facility 09/21/2021   Hx of total knee arthroplasty, right 05/21/2021   Genetic testing 06/07/2018   Malignant neoplasm of upper-outer quadrant of right breast in female, estrogen receptor positive (HCC) 04/19/2018   Wears hearing aid    Wears glasses    Vaginal vault prolapse    Hiatal hernia    BPPV (benign paroxysmal positional vertigo)    Palpitations 10/31/2016   Radicular pain of  right lower back 04/06/2015   Compression fracture of L3 vertebra (HCC) 04/06/2015   Senile osteoporosis 04/06/2015   DDD (degenerative disc disease), lumbar 04/06/2015   Hx of adenomatous colonic polyps 07/11/2013   Hyperlipidemia LDL goal <100    Bladder prolapse, female, acquired     PCP: Rollene Almarie LABOR, MD   REFERRING PROVIDER: Trudy Duwaine BRAVO, NP   REFERRING DIAG:  Diagnosis  R26.89 (ICD-10-CM) - Imbalance    THERAPY DIAG:  Muscle weakness (generalized)  Difficulty in walking, not elsewhere classified  Unsteadiness on feet  Rationale for Evaluation and Treatment: Rehabilitation  ONSET DATE: ongoing  SUBJECTIVE:   SUBJECTIVE STATEMENT: Pt reporting more of a tired feeling in my back than pain. Pt stating since her injection last Tuesday.   PERTINENT HISTORY:  HOH, skin melanoma, h/o breast CA, Left mastectomy, Rt lumpectomy, BPPV, asthma, see other PMH   PAIN:  NPRS scale: 1-2/10 Pain location: low back Pain description: achy Aggravating factors: bending, sitting, standing, lifting Relieving factors: lying in bed  PRECAUTIONS: None  WEIGHT BEARING RESTRICTIONS: No  FALLS:  Has patient fallen in last 6 months? No  LIVING ENVIRONMENT: Lives with: lives with their family Lives in: House/apartment Stairs: Yes: External: 3 steps; none, 1 flight, right hand rail Has following equipment at home: Single point cane  OCCUPATION: retired  PLOF: Independent  PATIENT GOALS: improve my balance   OBJECTIVE:   DIAGNOSTIC FINDINGS:  04/11/24:  XRs of the lumbar spine from 04/11/2024 were independently reviewed and  interpreted, showing lumbar degenerative scoliosis with apex to the left  at L2/3.  Disc height loss at L2/3 and L3/4.  No spondylolisthesis seen.   No evidence of instability on flexion/extension views.  Suspect an old L2  compression fracture but no other fracture seen.  No dislocation seen.   PATIENT SURVEYS:  Patient-Specific  Activity Scoring Scheme  0 represents "unable to perform." 10 represents "able to perform at prior level. 0 1 2 3 4 5 6 7 8 9  10 (Date and Score)   Activity Eval  04/29/24    1. Poor blance 3     2. Walking longer distances  5    3.     4.    5.    Score 4    Total score = sum of the activity scores/number of activities Minimum detectable change (90%CI) for average score = 2 points Minimum detectable change (90%CI) for single activity score = 3 points  COGNITION: 04/29/24 Overall cognitive status: WFL    SENSATION: 04/29/24 WFL  POSTURE:  04/29/24 rounded shoulders, forward head, and decreased lumbar lordosis   LOWER EXTREMITY ROM:   ROM Right Eval 04/29/24  Left Eval 04/29/24  Hip flexion 102 110  Hip extension    Hip abduction    Hip adduction    Hip internal rotation    Hip external rotation    Knee flexion    Knee extension    Ankle dorsiflexion    Ankle plantarflexion    Ankle inversion    Ankle eversion     (Blank rows = not tested)  LOWER EXTREMITY MMT:  MMT Right Eval 04/29/24 Left Eval 04/29/24  Hip flexion 23.3# 18.7#  Hip extension    Hip abduction    Hip adduction    Hip internal rotation    Hip external rotation    Knee flexion 23.9# 24.4#  Knee extension 30.3# 26.7#  Ankle dorsiflexion    Ankle plantarflexion    Ankle inversion    Ankle eversion     (Blank rows = not tested)    FUNCTIONAL TESTS:  05/09/2024: 5x sit to stand s UE assist:  16.05 seconds  04/29/24:  TUG: 15.54 seconds no device 5 time sit to stand: 19.1 seconds no UE support BERG: 44/56 [facility] PT Assessment - 04/29/24 0001                Balance    Balance Assessed Yes          Standardized Balance Assessment    Standardized Balance Assessment Berg Balance Test          Berg Balance Test    Sit to Stand Able to stand without using hands and stabilize independently     Standing Unsupported Able to stand safely 2 minutes     Sitting with Back  Unsupported but Feet Supported on Floor or Stool Able to sit safely and securely 2 minutes     Stand to Sit Sits safely with minimal use of hands     Transfers Able to transfer safely, definite need of hands     Standing Unsupported with Eyes Closed Able to stand 10 seconds safely     Standing Unsupported with Feet Together Able to place feet together independently and stand 1 minute safely     From Standing, Reach Forward with Outstretched Arm Can reach forward >12 cm safely (5)     From Standing Position, Pick up Object from Floor Able to pick up shoe, needs supervision     From Standing Position, Turn to Look Behind Over each Shoulder Looks behind one side only/other side shows less weight shift     Turn 360 Degrees Able to turn 360 degrees safely but slowly     Standing Unsupported, Alternately Place Feet on Step/Stool Able to complete 4 steps without aid or supervision     Standing Unsupported, One Foot in Front Able to plae foot ahead of the other independently and hold 30 seconds     Standing on One Leg Tries to lift leg/unable to hold 3 seconds but remains standing independently     Total Score 44      GAIT: 04/29/24: Distance walked: clinic distance Assistive device utilized: Single point cane Level of assistance: Modified independence Comments: scoliosis  TODAY'S TREATMENT                                                                          DATE: 05/14/2024 TherEx Calf raises x 15  Gastroc stretch on slant board: x 2 holding 30 sec Seated lumbar flexion rolling green physio-ball forward and back rolling one vertebra up at a time x 10   Neuro Re-Ed Standing balance: feet together, staggered stance bil  x 30  Standing on Airex: feet together, staggered stance bil x 30 sec  TherAct Nustep: UE/LE level 4 x 8  minutes, Push/pull, endurance, strength and ROM Sit to stand: 2 x 10  Stepping over 6 inch hurdle x 10 bil c single UE support Lateral step over 6 inch hurdle x 10 bil c bil UE support Step ups on 4 inch step x 15 bil LE c bil UE support    TODAY'S TREATMENT                                                                          DATE: 05/09/2024 Therex: Nustep UE/LE for ROM, aerobic intervention lvl 5 8 mins  Standing PF/DF alternating with hands on rail 2 x 10  Seated green band hip abduction with contralateral leg isometric hold 2 x 15 bilateral  Seated SLR x 10 bilateral with slow movement control focus.    Neuro Re-ed (balance improvement, control) Feet together stance EO 30 seconds, EC 30 seconds with SBA Modified tandem stance foot forward 1 min x 1 bilateral with SBA [place] pew anterior/posterior ankle strategy weight shift x 20 each direction with SBA Feet together stance on foam 1 min, with head turns Lt and Rt x 10 each way with SBA Feet together stance on foam with external perturbations by clinician x 20 various directions.   Time spent in education of techniques.      TODAY'S TREATMENT                                                                          DATE: 04/29/24 Therex:    HEP instruction/performance c cues for techniques, handout provided.  Trial set performed of each for comprehension and symptom assessment.  See below for exercise list   PATIENT EDUCATION:  Education details: HEP, POC Person educated: Patient Education method: Explanation, Demonstration, Verbal cues, and Handouts Education comprehension: verbalized understanding, returned demonstration, and verbal cues required  HOME EXERCISE PROGRAM: Access Code: [email or web address] URL: [email or web address] Date: 04/29/2024 Prepared by: Delon Lunger  Exercises - Supine Bridge  - 2 x daily - 7 x weekly - 3 sets - 10 reps - Seated Straight Leg Raise   - 2 x daily - 7  x weekly - 10 reps -  Supine Lower Trunk Rotation  - 2 x daily - 7 x weekly - 3 reps - 20 seconds hold - Sit to Stand with Counter Support  - 2 x daily - 7 x weekly - 2 sets - 10 reps  ASSESSMENT:  CLINICAL IMPRESSION: Pt arriving with new tennis shoes today. Pt stating she has been working on her HEP. Today beginning standing balance exercises with good tolerance. Verbal cues for techniques and upright posture when balance is challenged. Recommending continued skill PT.   OBJECTIVE IMPAIRMENTS: decreased balance, decreased mobility, difficulty walking, decreased ROM, impaired flexibility, postural dysfunction, and pain.   ACTIVITY LIMITATIONS: lifting, bending, standing, squatting, and stairs  PARTICIPATION LIMITATIONS: cleaning, laundry, shopping, and community activity  PERSONAL FACTORS: see PMH above are also affecting patient's functional outcome.   REHAB POTENTIAL: Good  CLINICAL DECISION MAKING: Stable/uncomplicated  EVALUATION COMPLEXITY: Low   GOALS: Goals reviewed with patient? Yes  SHORT TERM GOALS: (target date for Short term goals are 3 weeks 05/20/2024)   1.  Patient will demonstrate independent use of home exercise program to maintain progress from in clinic treatments.  Goal status: MET 05/14/24  LONG TERM GOALS: (target dates for all long term goals are 10 weeks  07/12/2024 )   1. Patient will demonstrate/report pain at worst less than or equal to 2/10 to facilitate minimal limitation in daily activity secondary to pain symptoms.  Goal status: New   2. Patient will demonstrate independent use of home exercise program to facilitate ability to maintain/progress functional gains from skilled physical therapy services.  Goal status: New   3. Patient will demonstrate Patient specific functional scale avg > or = 6 to indicate reduced disability due to condition.   Goal status: New   4.  Patient will demonstrate bilateral  LE MMT 5/5 throughout to faciltiate usual transfers, stairs,  squatting at [facility] for daily life.   Goal status: New   5.  Patient will demonstrate TUG in </= 13 seconds with no UE support Goal status: New   6.  Pt will improve BERG balance score to >/=50.  Goal status: New      PLAN:  PT FREQUENCY: 1-2x/week  PT DURATION: 10 weeks  PLANNED INTERVENTIONS: Can include 02853- PT Re-evaluation, 97110-Therapeutic exercises, 97530- Therapeutic activity, [id number]- Neuromuscular re-education, 97535- Self Care, 97140- Manual therapy, [phone]- Gait training, [phone]- Orthotic Fit/training, [phone]- Canalith repositioning, [id number]- Aquatic Therapy, [phone]- Electrical stimulation (unattended), [id number] Physical performance testing, 97016- Vasopneumatic device, [id number]- Ultrasound, [id number]- Traction (mechanical), [id number]- Ionotophoresis 4mg /ml Dexamethasone ,  79439 - Needle insertion w/o injection 1 or 2 muscles, 20561 - Needle insertion w/o injection 3 or more muscles.   Patient/Family education, Balance training, Stair training, Taping, Dry Needling, Joint mobilization, Joint manipulation, Spinal manipulation, Spinal mobilization, Scar mobilization, Vestibular training, Visual/preceptual remediation/compensation, DME instructions, Cryotherapy, and Moist heat.  All performed as medically necessary.  All included unless contraindicated  PLAN FOR NEXT SESSION: Continue  to progression of dynamic balance. Back intervention based off symptom presentation as needed.   Delon Lunger, PT, MPT 05/14/24 3:05 PM   05/14/24  3:05 PM    Referring diagnosis?  Diagnosis  R26.89 (ICD-10-CM) - Imbalance   Treatment diagnosis? (if different than referring diagnosis) R26.89, M62.81, R26.2 What was this (referring dx) caused by? []  Surgery []  Fall [x]  Ongoing issue []  Arthritis []  Other: ____________  Laterality: []  Rt []  Lt [x]  Both  Check all possible CPT codes:  *CHOOSE 10 OR LESS*  02853- PT Re-evaluation,  97110-Therapeutic exercises,  97530- Therapeutic activity,   [id number]- Neuromuscular re-education,  97535- Self Care,  97140- Manual therapy,  [id number]- Gait training,   [id number] Physical performance testing,

## 2024-05-21 ENCOUNTER — Encounter: Admitting: Rehabilitative and Restorative Service Providers"

## 2024-05-22 ENCOUNTER — Ambulatory Visit

## 2024-05-22 DIAGNOSIS — M6281 Muscle weakness (generalized): Secondary | ICD-10-CM

## 2024-05-22 DIAGNOSIS — R2681 Unsteadiness on feet: Secondary | ICD-10-CM

## 2024-05-22 DIAGNOSIS — R2689 Other abnormalities of gait and mobility: Secondary | ICD-10-CM

## 2024-05-22 DIAGNOSIS — R262 Difficulty in walking, not elsewhere classified: Secondary | ICD-10-CM

## 2024-05-22 NOTE — Therapy (Signed)
 OUTPATIENT PHYSICAL THERAPY TREATMENT   Patient Name: Rebecca Guzman MRN: [id number] DOB:13-Mar-1934, 89 y.o., female Today's Date: 05/22/2024  END OF SESSION:  PT End of Session - 05/22/24 1702     Visit Number 4    Number of Visits 11    Date for Recertification  07/12/24    Authorization Type [employer] $20 copay    Authorization Time Period 04/29/2024 - 07/12/2024    PT Start Time 1605    PT Stop Time 1645    PT Time Calculation (min) 40 min    Activity Tolerance Patient tolerated treatment well    Behavior During Therapy [facility] for tasks assessed/performed             Past Medical History:  Diagnosis Date   Asthma    BPPV (benign paroxysmal positional vertigo)    Breast cancer (HCC)    left mastectomy 1992, right lumpectomy 2019   Cancer (HCC)    Compression fracture of L3 lumbar vertebra 04/06/2015   Dysrhythmia 2019   pt states Cardiologist told her it is pre A-fib   Family history of adverse reaction to anesthesia    daughter had throat close up with anesthesia   Family history of breast cancer    Family history of colon cancer    Family history of pancreatic cancer    GERD (gastroesophageal reflux disease)    Hiatal hernia    pt states she doesnt have this   History of adenomatous polyp of colon    tubular adenoma 2005 and  2014 tubular adenoma and  hyperplastic polyp   History of breast cancer per pt no recurrence   dx 1992 --  s/p  left breast mastectomy and chemotherapy   Hyperlipidemia LDL goal <100    Osteoarthritis of right knee    Personal history of chemotherapy 1992   PONV (postoperative nausea and vomiting)    and gets weak and light-leaded   Sensation of pressure in bladder area    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   Vaginal vault prolapse    anterior   Wears glasses    Wears hearing aid    left only   Past Surgical History:  Procedure Laterality Date   BREAST BIOPSY Right 04/19/2018   BREAST BIOPSY Right 05/15/2023   MM RT BREAST BX W  LOC DEV 1ST LESION IMAGE BX SPEC STEREO GUIDE 05/15/2023 [facility]   BREAST LUMPECTOMY Right 05/17/2018   BREAST LUMPECTOMY WITH RADIOACTIVE SEED LOCALIZATION Right 05/17/2018   Procedure: RIGHT BREAST LUMPECTOMY WITH RADIOACTIVE SEED LOCALIZATION;  Surgeon: Aron Shoulders, MD;  Location: [facility] OR;  Service: General;  Laterality: Right;   CATARACT EXTRACTION W/ INTRAOCULAR LENS  IMPLANT, BILATERAL  2014   CHOLECYSTECTOMY  1985   COLONOSCOPY  last one 06-04-2013   CYSTOCELE REPAIR N/A 02/29/2016   Procedure: ANTERIOR VAULT PROLAPSE REPAIR COLOPLAST SACROSPINUS FIXATION AUGMENTED AXIS DERMIS REPAIR  ;  Surgeon: Arlena Gal, MD;  Location: [facility];  Service: Urology;  Laterality: N/A;   ESOPHAGOGASTRODUODENOSCOPY  08-31-2009   LAPAROSCOPIC ASSISTED VAGINAL HYSTERECTOMY  01-10-2000   w/ Bilateral Salpingoophorectomy /  Anterior & Posterior repair/  Pubovaginal Sling   MASTECTOMY Left 1992   SIMPLE MASTECTOMY WITH AXILLARY SENTINEL NODE BIOPSY Right 06/14/2023   Procedure: RIGHT MASTECTOMY WITH MAGTRACE INJECTION;  Surgeon: Aron Shoulders, MD;  Location: [facility];  Service: General;  Laterality: Right;  PEC BLOCK   TOTAL KNEE ARTHROPLASTY Right 05/21/2021   Procedure: RIGHT TOTAL KNEE ARTHROPLASTY;  Surgeon: Barbarann Oneil BROCKS, MD;  Location: [facility] OR;  Service: Orthopedics;  Laterality: Right;   Patient Active Problem List   Diagnosis Date Noted   Anemia 09/21/2021   Routine general medical examination at a health care facility 09/21/2021   Hx of total knee arthroplasty, right 05/21/2021   Genetic testing 06/07/2018   Malignant neoplasm of upper-outer quadrant of right breast in female, estrogen receptor positive (HCC) 04/19/2018   Wears hearing aid    Wears glasses    Vaginal vault prolapse    Hiatal hernia    BPPV (benign paroxysmal positional vertigo)    Palpitations 10/31/2016   Radicular pain of right lower back 04/06/2015   Compression fracture of  L3 vertebra (HCC) 04/06/2015   Senile osteoporosis 04/06/2015   DDD (degenerative disc disease), lumbar 04/06/2015   Hx of adenomatous colonic polyps 07/11/2013   Hyperlipidemia LDL goal <100    Bladder prolapse, female, acquired     PCP: Rollene Almarie LABOR, MD   REFERRING PROVIDER: Georgina Ozell LABOR, MD   REFERRING DIAG:  Diagnosis  R26.89 (ICD-10-CM) - Imbalance    THERAPY DIAG:  Muscle weakness (generalized)  Difficulty in walking, not elsewhere classified  Unsteadiness on feet  Balance problem  Rationale for Evaluation and Treatment: Rehabilitation  ONSET DATE: ongoing  SUBJECTIVE:   SUBJECTIVE STATEMENT: Pt  reports she feels like legs are getting stronger. PERTINENT HISTORY:  HOH, skin melanoma, h/o breast CA, Left mastectomy, Rt lumpectomy, BPPV, asthma, see other PMH   PAIN:  NPRS scale: 2/10 Pain location: low back Pain description: achy Aggravating factors: bending, sitting, standing, lifting Relieving factors: lying in bed  PRECAUTIONS: None  WEIGHT BEARING RESTRICTIONS: No  FALLS:  Has patient fallen in last 6 months? No  LIVING ENVIRONMENT: Lives with: lives with their family Lives in: House/apartment Stairs: Yes: External: 3 steps; none, 1 flight, right hand rail Has following equipment at home: Single point cane  OCCUPATION: retired  PLOF: Independent  PATIENT GOALS: improve my balance   OBJECTIVE:   DIAGNOSTIC FINDINGS:  04/11/24:  XRs of the lumbar spine from 04/11/2024 were independently reviewed and  interpreted, showing lumbar degenerative scoliosis with apex to the left  at L2/3.  Disc height loss at L2/3 and L3/4.  No spondylolisthesis seen.   No evidence of instability on flexion/extension views.  Suspect an old L2  compression fracture but no other fracture seen.  No dislocation seen.   PATIENT SURVEYS:  Patient-Specific Activity Scoring Scheme  0 represents "unable to perform." 10 represents "able to perform  at prior level. 0 1 2 3 4 5 6 7 8 9  10 (Date and Score)   Activity Eval  04/29/24    1. Poor blance 3     2. Walking longer distances  5    3.     4.    5.    Score 4    Total score = sum of the activity scores/number of activities Minimum detectable change (90%CI) for average score = 2 points Minimum detectable change (90%CI) for single activity score = 3 points  COGNITION: 04/29/24 Overall cognitive status: WFL    SENSATION: 04/29/24 WFL  POSTURE:  04/29/24 rounded shoulders, forward head, and decreased lumbar lordosis   LOWER EXTREMITY ROM:   ROM Right Eval 04/29/24  Left Eval 04/29/24  Hip flexion 102 110  Hip extension    Hip abduction    Hip adduction    Hip internal rotation    Hip external rotation  Knee flexion    Knee extension    Ankle dorsiflexion    Ankle plantarflexion    Ankle inversion    Ankle eversion     (Blank rows = not tested)  LOWER EXTREMITY MMT:  MMT Right Eval 04/29/24 Left Eval 04/29/24  Hip flexion 23.3# 18.7#  Hip extension    Hip abduction    Hip adduction    Hip internal rotation    Hip external rotation    Knee flexion 23.9# 24.4#  Knee extension 30.3# 26.7#  Ankle dorsiflexion    Ankle plantarflexion    Ankle inversion    Ankle eversion     (Blank rows = not tested)    FUNCTIONAL TESTS:  05/09/2024: 5x sit to stand s UE assist:  16.05 seconds  04/29/24:  TUG: 15.54 seconds no device 5 time sit to stand: 19.1 seconds no UE support BERG: 44/56 [facility] PT Assessment - 04/29/24 0001                Balance    Balance Assessed Yes          Standardized Balance Assessment    Standardized Balance Assessment Berg Balance Test          Berg Balance Test    Sit to Stand Able to stand without using hands and stabilize independently     Standing Unsupported Able to stand safely 2 minutes     Sitting with Back Unsupported but Feet Supported on Floor or Stool Able to sit safely and securely 2 minutes     Stand to  Sit Sits safely with minimal use of hands     Transfers Able to transfer safely, definite need of hands     Standing Unsupported with Eyes Closed Able to stand 10 seconds safely     Standing Unsupported with Feet Together Able to place feet together independently and stand 1 minute safely     From Standing, Reach Forward with Outstretched Arm Can reach forward >12 cm safely (5)     From Standing Position, Pick up Object from Floor Able to pick up shoe, needs supervision     From Standing Position, Turn to Look Behind Over each Shoulder Looks behind one side only/other side shows less weight shift     Turn 360 Degrees Able to turn 360 degrees safely but slowly     Standing Unsupported, Alternately Place Feet on Step/Stool Able to complete 4 steps without aid or supervision     Standing Unsupported, One Foot in Front Able to plae foot ahead of the other independently and hold 30 seconds     Standing on One Leg Tries to lift leg/unable to hold 3 seconds but remains standing independently     Total Score 44      GAIT: 04/29/24: Distance walked: clinic distance Assistive device utilized: Single point cane Level of assistance: Modified independence Comments: scoliosis  TODAY'S TREATMENT                                                                          DATE:  05/22/24  TherAct Nustep: UE/LE level 4 x 8 minutes, Push/pull, endurance, strength and ROM Sit to stand: 2 x 10  Stepping over 6 inch hurdle x 10 bil c single UE support Lateral step over 6 inch hurdle x 10 bil c bil UE support Step ups on 4 inch step x 15 bil LE c bil UE support TherEx Calf raises x 15  Gastroc stretch on slant board: x 2 holding 30 sec Seated lumbar flexion rolling green physio-ball forward and back rolling one vertebra up at a time x 10    Neuro Re-Ed Standing balance: feet together, staggered stance bil  x 30  Standing on Airex: feet together, staggered stance bil x 30 sec Step ups  onto Air ex 10x each leg   05/14/2024 TherEx Calf raises x 15  Gastroc stretch on slant board: x 2 holding 30 sec Seated lumbar flexion rolling green physio-ball forward and back rolling one vertebra up at a time x 10   Neuro Re-Ed Standing balance: feet together, staggered stance bil  x 30  Standing on Airex: feet together, staggered stance bil x 30 sec  TherAct Nustep: UE/LE level 4 x 8 minutes, Push/pull, endurance, strength and ROM Sit to stand: 2 x 10  Stepping over 6 inch hurdle x 10 bil c single UE support Lateral step over 6 inch hurdle x 10 bil c bil UE support Step ups on 4 inch step x 15 bil LE c bil UE support    TODAY'S TREATMENT                                                                          DATE: 05/09/2024 Therex: Nustep UE/LE for ROM, aerobic intervention lvl 5 8 mins  Standing PF/DF alternating with hands on rail 2 x 10  Seated green band hip abduction with contralateral leg isometric hold 2 x 15 bilateral  Seated SLR x 10 bilateral with slow movement control focus.    Neuro Re-ed (balance improvement, control) Feet together stance EO 30 seconds, EC 30 seconds with SBA Modified tandem stance foot forward 1 min x 1 bilateral with SBA [place] pew anterior/posterior ankle strategy weight shift x 20 each direction with SBA Feet together stance on foam 1 min, with head turns Lt and Rt x 10 each way with SBA Feet together stance on foam with external perturbations by clinician x 20 various directions.   Time spent in education of techniques.      TODAY'S TREATMENT  DATE: 04/29/24 Therex:    HEP instruction/performance c cues for techniques, handout provided.  Trial set performed of each for comprehension and symptom assessment.  See  below for exercise list   PATIENT EDUCATION:  Education details: HEP, POC Person educated: Patient Education method: Explanation, Demonstration, Verbal cues, and Handouts Education comprehension: verbalized understanding, returned demonstration, and verbal cues required  HOME EXERCISE PROGRAM: Access Code: [email or web address] URL: [email or web address] Date: 04/29/2024 Prepared by: Delon Lunger  Exercises - Supine Bridge  - 2 x daily - 7 x weekly - 3 sets - 10 reps - Seated Straight Leg Raise   - 2 x daily - 7 x weekly - 10 reps - Supine Lower Trunk Rotation  - 2 x daily - 7 x weekly - 3 reps - 20 seconds hold - Sit to Stand with Counter Support  - 2 x daily - 7 x weekly - 2 sets - 10 reps  ASSESSMENT:  CLINICAL IMPRESSION: Pt needed some assist and  bar use with 2 episodes of increased sway with balance activities on Airex.  They occurred at > 15 seconds. OBJECTIVE IMPAIRMENTS: decreased balance, decreased mobility, difficulty walking, decreased ROM, impaired flexibility, postural dysfunction, and pain.   ACTIVITY LIMITATIONS: lifting, bending, standing, squatting, and stairs  PARTICIPATION LIMITATIONS: cleaning, laundry, shopping, and community activity  PERSONAL FACTORS: see PMH above are also affecting patient's functional outcome.   REHAB POTENTIAL: Good  CLINICAL DECISION MAKING: Stable/uncomplicated  EVALUATION COMPLEXITY: Low   GOALS: Goals reviewed with patient? Yes  SHORT TERM GOALS: (target date for Short term goals are 3 weeks 05/20/2024)   1.  Patient will demonstrate independent use of home exercise program to maintain progress from in clinic treatments.  Goal status: MET 05/14/24  LONG TERM GOALS: (target dates for all long term goals are 10 weeks  07/12/2024 )   1. Patient will demonstrate/report pain at worst less than or equal to 2/10 to facilitate minimal limitation in daily activity secondary to pain symptoms.  Goal status: New   2.  Patient will demonstrate independent use of home exercise program to facilitate ability to maintain/progress functional gains from skilled physical therapy services.  Goal status: New   3. Patient will demonstrate Patient specific functional scale avg > or = 6 to indicate reduced disability due to condition.   Goal status: New   4.  Patient will demonstrate bilateral  LE MMT 5/5 throughout to faciltiate usual transfers, stairs, squatting at [facility] for daily life.   Goal status: New   5.  Patient will demonstrate TUG in </= 13 seconds with no UE support Goal status: New   6.  Pt will improve BERG balance score to >/=50.  Goal status: New      PLAN:  PT FREQUENCY: 1-2x/week  PT DURATION: 10 weeks  PLANNED INTERVENTIONS: Can include 02853- PT Re-evaluation, 97110-Therapeutic exercises, 97530- Therapeutic activity, [id number]- Neuromuscular re-education, 97535- Self Care, 97140- Manual therapy, [id number]- Gait training, [phone]- Orthotic Fit/training, [phone]- Canalith repositioning, [id number]- Aquatic Therapy, [phone]- Electrical stimulation (unattended), [id number] Physical performance testing, 97016- Vasopneumatic device, [id number]- Ultrasound, [id number]- Traction (mechanical), [id number]- Ionotophoresis 4mg /ml Dexamethasone ,  79439 - Needle insertion w/o injection 1 or 2 muscles, 20561 - Needle insertion w/o injection 3 or more muscles.   Patient/Family education, Balance training, Stair training, Taping, Dry Needling, Joint mobilization, Joint manipulation, Spinal manipulation, Spinal mobilization, Scar mobilization, Vestibular training, Visual/preceptual remediation/compensation, DME instructions, Cryotherapy, and Moist heat.  All performed as medically necessary.  All included unless contraindicated  PLAN FOR NEXT  SESSION: Continue  with progression of dynamic balance. Back intervention based off symptom presentation as needed.  Burnard Meth, PT 05/22/24  5:03 PM     Referring diagnosis?  Diagnosis  R26.89  (ICD-10-CM) - Imbalance   Treatment diagnosis? (if different than referring diagnosis) R26.89, M62.81, R26.2 What was this (referring dx) caused by? []  Surgery []  Fall [x]  Ongoing issue []  Arthritis []  Other: ____________  Laterality: []  Rt []  Lt [x]  Both  Check all possible CPT codes:  *CHOOSE 10 OR LESS*     97146- PT Re-evaluation,  97110-Therapeutic exercises,  97530- Therapeutic activity,  [id number]- Neuromuscular re-education,  97535- Self Care,  97140- Manual therapy,  [id number]- Gait training,   [id number] Physical performance testing,

## 2024-05-23 DIAGNOSIS — L578 Other skin changes due to chronic exposure to nonionizing radiation: Secondary | ICD-10-CM | POA: Diagnosis not present

## 2024-05-23 DIAGNOSIS — D2272 Melanocytic nevi of left lower limb, including hip: Secondary | ICD-10-CM | POA: Diagnosis not present

## 2024-05-23 DIAGNOSIS — L821 Other seborrheic keratosis: Secondary | ICD-10-CM | POA: Diagnosis not present

## 2024-05-23 DIAGNOSIS — H019 Unspecified inflammation of eyelid: Secondary | ICD-10-CM | POA: Diagnosis not present

## 2024-05-23 DIAGNOSIS — Z86018 Personal history of other benign neoplasm: Secondary | ICD-10-CM | POA: Diagnosis not present

## 2024-05-23 DIAGNOSIS — Z85820 Personal history of malignant melanoma of skin: Secondary | ICD-10-CM | POA: Diagnosis not present

## 2024-05-23 DIAGNOSIS — D2271 Melanocytic nevi of right lower limb, including hip: Secondary | ICD-10-CM | POA: Diagnosis not present

## 2024-05-23 DIAGNOSIS — D2262 Melanocytic nevi of left upper limb, including shoulder: Secondary | ICD-10-CM | POA: Diagnosis not present

## 2024-05-23 DIAGNOSIS — L814 Other melanin hyperpigmentation: Secondary | ICD-10-CM | POA: Diagnosis not present

## 2024-05-27 ENCOUNTER — Ambulatory Visit: Admitting: Physical Therapy

## 2024-05-27 ENCOUNTER — Encounter: Payer: Self-pay | Admitting: Physical Therapy

## 2024-05-27 DIAGNOSIS — R2681 Unsteadiness on feet: Secondary | ICD-10-CM

## 2024-05-27 DIAGNOSIS — M6281 Muscle weakness (generalized): Secondary | ICD-10-CM

## 2024-05-27 DIAGNOSIS — R2689 Other abnormalities of gait and mobility: Secondary | ICD-10-CM | POA: Diagnosis not present

## 2024-05-27 DIAGNOSIS — R262 Difficulty in walking, not elsewhere classified: Secondary | ICD-10-CM

## 2024-05-27 NOTE — Therapy (Addendum)
 OUTPATIENT PHYSICAL THERAPY TREATMENT   Patient Name: Rebecca Guzman MRN: [id number] DOB:1934/02/19, 89 y.o., female Today's Date: 05/27/2024  END OF SESSION:  PT End of Session - 05/27/24 1420     Visit Number 5    Number of Visits 11    Date for Recertification  07/12/24    Authorization Type [employer] $20 copay    Authorization Time Period 04/29/2024 - 07/12/2024    Authorization - Visit Number 4    Authorization - Number of Visits 10    Progress Note Due on Visit 10    PT Start Time 1345    PT Stop Time 1425    PT Time Calculation (min) 40 min    Activity Tolerance Patient tolerated treatment well    Behavior During Therapy [facility] for tasks assessed/performed              Past Medical History:  Diagnosis Date   Asthma    BPPV (benign paroxysmal positional vertigo)    Breast cancer (HCC)    left mastectomy 1992, right lumpectomy 2019   Cancer (HCC)    Compression fracture of L3 lumbar vertebra 04/06/2015   Dysrhythmia 2019   pt states Cardiologist told her it is pre A-fib   Family history of adverse reaction to anesthesia    daughter had throat close up with anesthesia   Family history of breast cancer    Family history of colon cancer    Family history of pancreatic cancer    GERD (gastroesophageal reflux disease)    Hiatal hernia    pt states she doesnt have this   History of adenomatous polyp of colon    tubular adenoma 2005 and  2014 tubular adenoma and  hyperplastic polyp   History of breast cancer per pt no recurrence   dx 1992 --  s/p  left breast mastectomy and chemotherapy   Hyperlipidemia LDL goal <100    Osteoarthritis of right knee    Personal history of chemotherapy 1992   PONV (postoperative nausea and vomiting)    and gets weak and light-leaded   Sensation of pressure in bladder area    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   Vaginal vault prolapse    anterior   Wears glasses    Wears hearing aid    left only   Past Surgical History:   Procedure Laterality Date   BREAST BIOPSY Right 04/19/2018   BREAST BIOPSY Right 05/15/2023   MM RT BREAST BX W LOC DEV 1ST LESION IMAGE BX SPEC STEREO GUIDE 05/15/2023 [facility]   BREAST LUMPECTOMY Right 05/17/2018   BREAST LUMPECTOMY WITH RADIOACTIVE SEED LOCALIZATION Right 05/17/2018   Procedure: RIGHT BREAST LUMPECTOMY WITH RADIOACTIVE SEED LOCALIZATION;  Surgeon: Aron Shoulders, MD;  Location: [facility];  Service: General;  Laterality: Right;   CATARACT EXTRACTION W/ INTRAOCULAR LENS  IMPLANT, BILATERAL  2014   CHOLECYSTECTOMY  1985   COLONOSCOPY  last one 06-04-2013   CYSTOCELE REPAIR N/A 02/29/2016   Procedure: ANTERIOR VAULT PROLAPSE REPAIR COLOPLAST SACROSPINUS FIXATION AUGMENTED AXIS DERMIS REPAIR  ;  Surgeon: Arlena Gal, MD;  Location: [facility] ;  Service: Urology;  Laterality: N/A;   ESOPHAGOGASTRODUODENOSCOPY  08-31-2009   LAPAROSCOPIC ASSISTED VAGINAL HYSTERECTOMY  01-10-2000   w/ Bilateral Salpingoophorectomy /  Anterior & Posterior repair/  Pubovaginal Sling   MASTECTOMY Left 1992   SIMPLE MASTECTOMY WITH AXILLARY SENTINEL NODE BIOPSY Right 06/14/2023   Procedure: RIGHT MASTECTOMY WITH MAGTRACE INJECTION;  Surgeon: Aron Shoulders, MD;  Location:   SURGERY CENTER;  Service: General;  Laterality: Right;  PEC BLOCK   TOTAL KNEE ARTHROPLASTY Right 05/21/2021   Procedure: RIGHT TOTAL KNEE ARTHROPLASTY;  Surgeon: Barbarann Oneil BROCKS, MD;  Location: [facility] OR;  Service: Orthopedics;  Laterality: Right;   Patient Active Problem List   Diagnosis Date Noted   Anemia 09/21/2021   Routine general medical examination at a health care facility 09/21/2021   Hx of total knee arthroplasty, right 05/21/2021   Genetic testing 06/07/2018   Malignant neoplasm of upper-outer quadrant of right breast in female, estrogen receptor positive (HCC) 04/19/2018   Wears hearing aid    Wears glasses    Vaginal vault prolapse    Hiatal hernia    BPPV (benign paroxysmal  positional vertigo)    Palpitations 10/31/2016   Radicular pain of right lower back 04/06/2015   Compression fracture of L3 vertebra (HCC) 04/06/2015   Senile osteoporosis 04/06/2015   DDD (degenerative disc disease), lumbar 04/06/2015   Hx of adenomatous colonic polyps 07/11/2013   Hyperlipidemia LDL goal <100    Bladder prolapse, female, acquired     PCP: Rollene Almarie LABOR, MD   REFERRING PROVIDER: Georgina Ozell LABOR, MD   REFERRING DIAG:  Diagnosis  R26.89 (ICD-10-CM) - Imbalance    THERAPY DIAG:  Muscle weakness (generalized)  Difficulty in walking, not elsewhere classified  Unsteadiness on feet  Balance problem  Rationale for Evaluation and Treatment: Rehabilitation  ONSET DATE: ongoing  SUBJECTIVE:   SUBJECTIVE STATEMENT: Pt arriving today reporting 4/10 pain. Pt stating she hasn't been vacuuming today which makes it worse.   PERTINENT HISTORY: HOH, skin melanoma, h/o breast CA, Left mastectomy, Rt lumpectomy, BPPV, asthma, see other PMH  PAIN:  NPRS scale: 4/10 Pain location: low back Pain description: achy Aggravating factors: bending, sitting, standing, lifting Relieving factors: lying in bed  PRECAUTIONS: None  WEIGHT BEARING RESTRICTIONS: No  FALLS:  Has patient fallen in last 6 months? No  LIVING ENVIRONMENT: Lives with: lives with their family Lives in: House/apartment Stairs: Yes: External: 3 steps; none, 1 flight, right hand rail Has following equipment at home: Single point cane  OCCUPATION: retired  PLOF: Independent  PATIENT GOALS: improve my balance   OBJECTIVE:   DIAGNOSTIC FINDINGS:  04/11/24:  XRs of the lumbar spine from 04/11/2024 were independently reviewed and  interpreted, showing lumbar degenerative scoliosis with apex to the left  at L2/3.  Disc height loss at L2/3 and L3/4.  No spondylolisthesis seen.   No evidence of instability on flexion/extension views.  Suspect an old L2  compression fracture but no  other fracture seen.  No dislocation seen.   PATIENT SURVEYS:  Patient-Specific Activity Scoring Scheme  0 represents "unable to perform." 10 represents "able to perform at prior level. 0 1 2 3 4 5 6 7 8 9  10 (Date and Score)   Activity Eval  04/29/24 05/27/24   1. Poor blance 3   6  2. Walking longer distances  5 5   3.     4.    5.    Score 4 5.5   Total score = sum of the activity scores/number of activities Minimum detectable change (90%CI) for average score = 2 points Minimum detectable change (90%CI) for single activity score = 3 points  COGNITION: 04/29/24 Overall cognitive status: WFL    SENSATION: 04/29/24 WFL  POSTURE:  04/29/24 rounded shoulders, forward head, and decreased lumbar lordosis   LOWER EXTREMITY ROM:   ROM Right Eval 04/29/24  Left Eval 04/29/24  Hip flexion 102 110  Hip extension    Hip abduction    Hip adduction    Hip internal rotation    Hip external rotation    Knee flexion    Knee extension    Ankle dorsiflexion    Ankle plantarflexion    Ankle inversion    Ankle eversion     (Blank rows = not tested)  LOWER EXTREMITY MMT:  MMT Right Eval 04/29/24 Left Eval 04/29/24 Rt 05/27/24 Left 05/27/24  Hip flexion 23.3# 18.7#    Hip extension      Hip abduction      Hip adduction      Hip internal rotation      Hip external rotation      Knee flexion 23.9# 24.4# 31.9# 31.0#  Knee extension 30.3# 26.7# 44.2# 48.1#  Ankle dorsiflexion      Ankle plantarflexion      Ankle inversion      Ankle eversion       (Blank rows = not tested)    FUNCTIONAL TESTS:  05/09/2024: 5x sit to stand s UE assist:  16.05 seconds  04/29/24:  TUG: 15.54 seconds no device 5 time sit to stand: 19.1 seconds no UE support BERG: 44/56 [facility] PT Assessment - 04/29/24 0001                Balance    Balance Assessed Yes          Standardized Balance Assessment    Standardized Balance Assessment Berg Balance Test          Berg Balance Test     Sit to Stand Able to stand without using hands and stabilize independently     Standing Unsupported Able to stand safely 2 minutes     Sitting with Back Unsupported but Feet Supported on Floor or Stool Able to sit safely and securely 2 minutes     Stand to Sit Sits safely with minimal use of hands     Transfers Able to transfer safely, definite need of hands     Standing Unsupported with Eyes Closed Able to stand 10 seconds safely     Standing Unsupported with Feet Together Able to place feet together independently and stand 1 minute safely     From Standing, Reach Forward with Outstretched Arm Can reach forward >12 cm safely (5)     From Standing Position, Pick up Object from Floor Able to pick up shoe, needs supervision     From Standing Position, Turn to Look Behind Over each Shoulder Looks behind one side only/other side shows less weight shift     Turn 360 Degrees Able to turn 360 degrees safely but slowly     Standing Unsupported, Alternately Place Feet on Step/Stool Able to complete 4 steps without aid or supervision     Standing Unsupported, One Foot in Front Able to plae foot ahead of the other independently and hold 30 seconds     Standing on One Leg Tries to lift leg/unable to hold 3 seconds but remains standing independently     Total Score 44      GAIT: 04/29/24: Distance walked: clinic distance Assistive device utilized: Single point cane Level of assistance: Modified independence Comments: scoliosis  TODAY'S TREATMENT                                                                          DATE:  05/27/24 TherAct Sit to stand: x 10  Step ups on 6 inch step x 10 bil LE c single UE support Nustep: level 5 x 10 minutes, (push, pull, Rom, strengthening, endurance) TherEx MMT updated see chart  above  Neuro Re-Ed Walking tandem: in parallel bars x 6 c intermittent UE support Walking on Airex Beam x 6 c intermittent tUE support  Side stepping on Airex beam x 6 c finger tap support as needed Standing on Fitter First 2 point board: rocking side to side x 1 minute, front/back x 1 minute    TODAY'S TREATMENT                                                                          DATE:  05/22/24  TherAct Nustep: UE/LE level 4 x 8 minutes, Push/pull, endurance, strength and ROM Sit to stand: 2 x 10  Stepping over 6 inch hurdle x 10 bil c single UE support Lateral step over 6 inch hurdle x 10 bil c bil UE support Step ups on 4 inch step x 15 bil LE c bil UE support TherEx Calf raises x 15  Gastroc stretch on slant board: x 2 holding 30 sec Seated lumbar flexion rolling green physio-ball forward and back rolling one vertebra up at a time x 10   Neuro Re-Ed Standing balance: feet together, staggered stance bil  x 30  Standing on Airex: feet together, staggered stance bil x 30 sec Step ups  onto Air ex 10x each leg   05/14/2024 TherEx Calf raises x 15  Gastroc stretch on slant board: x 2 holding 30 sec Seated lumbar flexion rolling green physio-ball forward and back rolling one vertebra up at a time x 10   Neuro Re-Ed Standing balance: feet together, staggered stance bil  x 30  Standing on Airex: feet together, staggered stance bil x 30 sec  TherAct Nustep: UE/LE level 4 x 8 minutes, Push/pull, endurance, strength and ROM Sit to stand: 2 x 10  Stepping over 6 inch hurdle x 10 bil c single UE support Lateral step over 6 inch hurdle x 10 bil c bil UE support Step ups on 4 inch step x 15 bil LE c bil UE support       PATIENT EDUCATION:  Education details: HEP, POC Person educated: Patient Education method: Programmer, multimedia, Demonstration, Verbal cues, and Handouts Education comprehension: verbalized understanding, returned demonstration, and verbal cues required  HOME  EXERCISE PROGRAM: Access Code: [email or web address] URL: [email or web address] Date: 04/29/2024 Prepared by: Delon Lunger  Exercises - Supine Bridge  - 2 x daily - 7 x weekly - 3 sets - 10 reps - Seated Straight Leg Raise   - 2 x daily - 7 x weekly - 10 reps - Supine Lower Trunk Rotation  -  2 x daily - 7 x weekly - 3 reps - 20 seconds hold - Sit to Stand with Counter Support  - 2 x daily - 7 x weekly - 2 sets - 10 reps  ASSESSMENT:  CLINICAL IMPRESSION: Treatment focusing on balance and functional mobility. Pt requiring UE support for dynamic balance activities. Still recommending pt use her st cane.  OBJECTIVE IMPAIRMENTS: decreased balance, decreased mobility, difficulty walking, decreased ROM, impaired flexibility, postural dysfunction, and pain.   ACTIVITY LIMITATIONS: lifting, bending, standing, squatting, and stairs  PARTICIPATION LIMITATIONS: cleaning, laundry, shopping, and community activity  PERSONAL FACTORS: see PMH above are also affecting patient's functional outcome.   REHAB POTENTIAL: Good  CLINICAL DECISION MAKING: Stable/uncomplicated  EVALUATION COMPLEXITY: Low   GOALS: Goals reviewed with patient? Yes  SHORT TERM GOALS: (target date for Short term goals are 3 weeks 05/20/2024)   1.  Patient will demonstrate independent use of home exercise program to maintain progress from in clinic treatments.  Goal status: MET 05/14/24  LONG TERM GOALS: (target dates for all long term goals are 10 weeks  07/12/2024 )   1. Patient will demonstrate/report pain at worst less than or equal to 2/10 to facilitate minimal limitation in daily activity secondary to pain symptoms.  Goal status: ongoing 05/27/24   2. Patient will demonstrate independent use of home exercise program to facilitate ability to maintain/progress functional gains from skilled physical therapy services.  Goal status: New   3. Patient will demonstrate Patient specific functional scale avg > or =  6 to indicate reduced disability due to condition.   Goal status: New   4.  Patient will demonstrate bilateral  LE MMT 5/5 throughout to faciltiate usual transfers, stairs, squatting at [facility] for daily life.   Goal status: New   5.  Patient will demonstrate TUG in </= 13 seconds with no UE support Goal status: New   6.  Pt will improve BERG balance score to >/=50.  Goal status: New      PLAN:  PT FREQUENCY: 1-2x/week  PT DURATION: 10 weeks  PLANNED INTERVENTIONS: Can include 02853- PT Re-evaluation, 97110-Therapeutic exercises, 97530- Therapeutic activity, [id number]- Neuromuscular re-education, 97535- Self Care, 97140- Manual therapy, [phone]- Gait training, [phone]- Orthotic Fit/training, [phone]- Canalith repositioning, [id number]- Aquatic Therapy, [phone]- Electrical stimulation (unattended), [id number] Physical performance testing, 97016- Vasopneumatic device, [id number]- Ultrasound, [id number]- Traction (mechanical), [id number]- Ionotophoresis 4mg /ml Dexamethasone ,  79439 - Needle insertion w/o injection 1 or 2 muscles, 20561 - Needle insertion w/o injection 3 or more muscles.   Patient/Family education, Balance training, Stair training, Taping, Dry Needling, Joint mobilization, Joint manipulation, Spinal manipulation, Spinal mobilization, Scar mobilization, Vestibular training, Visual/preceptual remediation/compensation, DME instructions, Cryotherapy, and Moist heat.  All performed as medically necessary.  All included unless contraindicated  PLAN FOR NEXT SESSION: Continue  with progression of dynamic balance. Recheck BERG in the next 1-2 visits  Delon Lunger, PT, MPT 05/27/24 2:22 PM   05/27/24  2:22 PM     Referring diagnosis?  Diagnosis  R26.89 (ICD-10-CM) - Imbalance   Treatment diagnosis? (if different than referring diagnosis) R26.89, M62.81, R26.2 What was this (referring dx) caused by? []  Surgery []  Fall [x]  Ongoing issue []  Arthritis []  Other: ____________  Laterality: []  Rt []   Lt [x]  Both  Check all possible CPT codes:  *CHOOSE 10 OR LESS*     97146- PT Re-evaluation,  97110-Therapeutic exercises,  97530- Therapeutic activity,  [id number]- Neuromuscular re-education,  97535- Self Care,  97140- Manual therapy,  [id number]- Gait training,  02249 Physical performance testing,

## 2024-05-28 DIAGNOSIS — R351 Nocturia: Secondary | ICD-10-CM | POA: Diagnosis not present

## 2024-05-28 DIAGNOSIS — R3914 Feeling of incomplete bladder emptying: Secondary | ICD-10-CM | POA: Diagnosis not present

## 2024-05-28 DIAGNOSIS — R399 Unspecified symptoms and signs involving the genitourinary system: Secondary | ICD-10-CM | POA: Diagnosis not present

## 2024-05-28 DIAGNOSIS — R350 Frequency of micturition: Secondary | ICD-10-CM | POA: Diagnosis not present

## 2024-05-28 DIAGNOSIS — N390 Urinary tract infection, site not specified: Secondary | ICD-10-CM | POA: Diagnosis not present

## 2024-05-29 DIAGNOSIS — J3089 Other allergic rhinitis: Secondary | ICD-10-CM | POA: Diagnosis not present

## 2024-05-29 DIAGNOSIS — J300 Vasomotor rhinitis: Secondary | ICD-10-CM | POA: Diagnosis not present

## 2024-05-29 DIAGNOSIS — J4520 Mild intermittent asthma, uncomplicated: Secondary | ICD-10-CM | POA: Diagnosis not present

## 2024-06-03 ENCOUNTER — Encounter: Payer: Self-pay | Admitting: Physical Therapy

## 2024-06-03 ENCOUNTER — Ambulatory Visit: Admitting: Physical Therapy

## 2024-06-03 DIAGNOSIS — M6281 Muscle weakness (generalized): Secondary | ICD-10-CM

## 2024-06-03 DIAGNOSIS — R2689 Other abnormalities of gait and mobility: Secondary | ICD-10-CM

## 2024-06-03 DIAGNOSIS — R2681 Unsteadiness on feet: Secondary | ICD-10-CM

## 2024-06-03 DIAGNOSIS — R262 Difficulty in walking, not elsewhere classified: Secondary | ICD-10-CM | POA: Diagnosis not present

## 2024-06-03 NOTE — Therapy (Signed)
 OUTPATIENT PHYSICAL THERAPY TREATMENT   Patient Name: Rebecca Guzman MRN: [id number] DOB:1934/05/16, 89 y.o., female Today's Date: 06/03/2024  END OF SESSION:  PT End of Session - 06/03/24 1410     Visit Number 6    Number of Visits 11    Date for Recertification  07/12/24    Authorization Type [employer] $20 copay    Authorization Time Period 04/29/2024 - 07/12/2024    Authorization - Visit Number 5    Authorization - Number of Visits 10    Progress Note Due on Visit 10    PT Start Time 1345    PT Stop Time 1425    PT Time Calculation (min) 40 min    Activity Tolerance Patient tolerated treatment well    Behavior During Therapy [facility] for tasks assessed/performed               Past Medical History:  Diagnosis Date   Asthma    BPPV (benign paroxysmal positional vertigo)    Breast cancer (HCC)    left mastectomy 1992, right lumpectomy 2019   Cancer (HCC)    Compression fracture of L3 lumbar vertebra 04/06/2015   Dysrhythmia 2019   pt states Cardiologist told her it is pre A-fib   Family history of adverse reaction to anesthesia    daughter had throat close up with anesthesia   Family history of breast cancer    Family history of colon cancer    Family history of pancreatic cancer    GERD (gastroesophageal reflux disease)    Hiatal hernia    pt states she doesnt have this   History of adenomatous polyp of colon    tubular adenoma 2005 and  2014 tubular adenoma and  hyperplastic polyp   History of breast cancer per pt no recurrence   dx 1992 --  s/p  left breast mastectomy and chemotherapy   Hyperlipidemia LDL goal <100    Osteoarthritis of right knee    Personal history of chemotherapy 1992   PONV (postoperative nausea and vomiting)    and gets weak and light-leaded   Sensation of pressure in bladder area    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   Vaginal vault prolapse    anterior   Wears glasses    Wears hearing aid    left only   Past Surgical History:   Procedure Laterality Date   BREAST BIOPSY Right 04/19/2018   BREAST BIOPSY Right 05/15/2023   MM RT BREAST BX W LOC DEV 1ST LESION IMAGE BX SPEC STEREO GUIDE 05/15/2023 [facility]   BREAST LUMPECTOMY Right 05/17/2018   BREAST LUMPECTOMY WITH RADIOACTIVE SEED LOCALIZATION Right 05/17/2018   Procedure: RIGHT BREAST LUMPECTOMY WITH RADIOACTIVE SEED LOCALIZATION;  Surgeon: Aron Shoulders, MD;  Location: [facility];  Service: General;  Laterality: Right;   CATARACT EXTRACTION W/ INTRAOCULAR LENS  IMPLANT, BILATERAL  2014   CHOLECYSTECTOMY  1985   COLONOSCOPY  last one 06-04-2013   CYSTOCELE REPAIR N/A 02/29/2016   Procedure: ANTERIOR VAULT PROLAPSE REPAIR COLOPLAST SACROSPINUS FIXATION AUGMENTED AXIS DERMIS REPAIR  ;  Surgeon: Arlena Gal, MD;  Location: [facility];  Service: Urology;  Laterality: N/A;   ESOPHAGOGASTRODUODENOSCOPY  08-31-2009   LAPAROSCOPIC ASSISTED VAGINAL HYSTERECTOMY  01-10-2000   w/ Bilateral Salpingoophorectomy /  Anterior & Posterior repair/  Pubovaginal Sling   MASTECTOMY Left 1992   SIMPLE MASTECTOMY WITH AXILLARY SENTINEL NODE BIOPSY Right 06/14/2023   Procedure: RIGHT MASTECTOMY WITH MAGTRACE INJECTION;  Surgeon: Aron Shoulders, MD;  Location: [facility];  Service: General;  Laterality: Right;  PEC BLOCK   TOTAL KNEE ARTHROPLASTY Right 05/21/2021   Procedure: RIGHT TOTAL KNEE ARTHROPLASTY;  Surgeon: Barbarann Oneil BROCKS, MD;  Location: [facility] OR;  Service: Orthopedics;  Laterality: Right;   Patient Active Problem List   Diagnosis Date Noted   Anemia 09/21/2021   Routine general medical examination at a health care facility 09/21/2021   Hx of total knee arthroplasty, right 05/21/2021   Genetic testing 06/07/2018   Malignant neoplasm of upper-outer quadrant of right breast in female, estrogen receptor positive (HCC) 04/19/2018   Wears hearing aid    Wears glasses    Vaginal vault prolapse    Hiatal hernia    BPPV (benign paroxysmal  positional vertigo)    Palpitations 10/31/2016   Radicular pain of right lower back 04/06/2015   Compression fracture of L3 vertebra (HCC) 04/06/2015   Senile osteoporosis 04/06/2015   DDD (degenerative disc disease), lumbar 04/06/2015   Hx of adenomatous colonic polyps 07/11/2013   Hyperlipidemia LDL goal <100    Bladder prolapse, female, acquired     PCP: Rollene Almarie LABOR, MD   REFERRING PROVIDER: Georgina Ozell LABOR, MD   REFERRING DIAG:  Diagnosis  R26.89 (ICD-10-CM) - Imbalance    THERAPY DIAG:  Muscle weakness (generalized)  Difficulty in walking, not elsewhere classified  Unsteadiness on feet  Balance problem  Rationale for Evaluation and Treatment: Rehabilitation  ONSET DATE: ongoing  SUBJECTIVE:   SUBJECTIVE STATEMENT: Pt reporting no pain in her legs today. Pt reporting pain of 4-5/10 pain in her low back.   PERTINENT HISTORY: HOH, skin melanoma, h/o breast CA, Left mastectomy, Rt lumpectomy, BPPV, asthma, see other PMH  PAIN:  NPRS scale: 4-5/10 Pain location: low back Pain description: achy Aggravating factors: bending, sitting, standing, lifting Relieving factors: lying in bed  PRECAUTIONS: None  WEIGHT BEARING RESTRICTIONS: No  FALLS:  Has patient fallen in last 6 months? No  LIVING ENVIRONMENT: Lives with: lives with their family Lives in: House/apartment Stairs: Yes: External: 3 steps; none, 1 flight, right hand rail Has following equipment at home: Single point cane  OCCUPATION: retired  PLOF: Independent  PATIENT GOALS: improve my balance   OBJECTIVE:   DIAGNOSTIC FINDINGS:  04/11/24:  XRs of the lumbar spine from 04/11/2024 were independently reviewed and  interpreted, showing lumbar degenerative scoliosis with apex to the left  at L2/3.  Disc height loss at L2/3 and L3/4.  No spondylolisthesis seen.   No evidence of instability on flexion/extension views.  Suspect an old L2  compression fracture but no other fracture  seen.  No dislocation seen.   PATIENT SURVEYS:  Patient-Specific Activity Scoring Scheme  0 represents "unable to perform." 10 represents "able to perform at prior level. 0 1 2 3 4 5 6 7 8 9  10 (Date and Score)   Activity Eval  04/29/24 05/27/24   1. Poor blance 3   6  2. Walking longer distances  5 5   3.     4.    5.    Score 4 5.5   Total score = sum of the activity scores/number of activities Minimum detectable change (90%CI) for average score = 2 points Minimum detectable change (90%CI) for single activity score = 3 points  COGNITION: 04/29/24 Overall cognitive status: WFL    SENSATION: 04/29/24 WFL  POSTURE:  04/29/24 rounded shoulders, forward head, and decreased lumbar lordosis   LOWER EXTREMITY ROM:   ROM Right  Eval 04/29/24  Left Eval 04/29/24  Hip flexion 102 110  Hip extension    Hip abduction    Hip adduction    Hip internal rotation    Hip external rotation    Knee flexion    Knee extension    Ankle dorsiflexion    Ankle plantarflexion    Ankle inversion    Ankle eversion     (Blank rows = not tested)  LOWER EXTREMITY MMT:  MMT Right Eval 04/29/24 Left Eval 04/29/24 Rt 05/27/24 Left 05/27/24  Hip flexion 23.3# 18.7#    Hip extension      Hip abduction      Hip adduction      Hip internal rotation      Hip external rotation      Knee flexion 23.9# 24.4# 31.9# 31.0#  Knee extension 30.3# 26.7# 44.2# 48.1#  Ankle dorsiflexion      Ankle plantarflexion      Ankle inversion      Ankle eversion       (Blank rows = not tested)    FUNCTIONAL TESTS:  06/03/24: TUG 12.5 seconds no UE support       5 time sit stand: 15 seconds no UE support  05/09/2024: 5x sit to stand s UE assist:  16.05 seconds  04/29/24:  TUG: 15.54 seconds no device 5 time sit to stand: 19.1 seconds no UE support BERG: 44/56 [facility] PT Assessment - 04/29/24 0001                Balance    Balance Assessed Yes          Standardized Balance Assessment     Standardized Balance Assessment Berg Balance Test          Berg Balance Test    Sit to Stand Able to stand without using hands and stabilize independently     Standing Unsupported Able to stand safely 2 minutes     Sitting with Back Unsupported but Feet Supported on Floor or Stool Able to sit safely and securely 2 minutes     Stand to Sit Sits safely with minimal use of hands     Transfers Able to transfer safely, definite need of hands     Standing Unsupported with Eyes Closed Able to stand 10 seconds safely     Standing Unsupported with Feet Together Able to place feet together independently and stand 1 minute safely     From Standing, Reach Forward with Outstretched Arm Can reach forward >12 cm safely (5)     From Standing Position, Pick up Object from Floor Able to pick up shoe, needs supervision     From Standing Position, Turn to Look Behind Over each Shoulder Looks behind one side only/other side shows less weight shift     Turn 360 Degrees Able to turn 360 degrees safely but slowly     Standing Unsupported, Alternately Place Feet on Step/Stool Able to complete 4 steps without aid or supervision     Standing Unsupported, One Foot in Front Able to plae foot ahead of the other independently and hold 30 seconds     Standing on One Leg Tries to lift leg/unable to hold 3 seconds but remains standing independently     Total Score 44      GAIT: 04/29/24: Distance walked: clinic distance Assistive device utilized: Single point cane Level of assistance: Modified independence Comments: scoliosis  TODAY'S TREATMENT                                                                          DATE:  06/03/24 TherAct Sit to stand: x 10  TUG and 5 time sit to stand: see chart above Nustep: level 5 x 8 minutes, (push, pull, Rom,  strengthening, endurance) Standing trunk rotation: standing in corned, tapping ball against the wall x 10 TherEx Standing hip abd: 2 x 10 bil c UE support Standing hip extension 2 x 10 bil c UE support Neuro Re-Ed Standing on Airex mat feet together x 2  Standing staggered on Airex mat x 2 each side  Tapping cones x 20 alternating cone placed in front of each LE, then cross midline taps  2 x 20  c single UE support  Side stepping toward cones placed lateral about 3 feet apart 2 x 10 c single UE support      TODAY'S TREATMENT                                                                          DATE:  05/27/24 TherAct Sit to stand: x 10  Step ups on 6 inch step x 10 bil LE c single UE support Nustep: level 5 x 10 minutes, (push, pull, Rom, strengthening, endurance) TherEx MMT updated see chart above  Neuro Re-Ed Walking tandem: in parallel bars x 6 c intermittent UE support Walking on Airex Beam x 6 c intermittent tUE support  Side stepping on Airex beam x 6 c finger tap support as needed Standing on Fitter First 2 point board: rocking side to side x 1 minute, front/back x 1 minute    TODAY'S TREATMENT                                                                          DATE:  05/22/24  TherAct Nustep: UE/LE level 4 x 8 minutes, Push/pull, endurance, strength and ROM Sit to stand: 2 x 10  Stepping over 6 inch hurdle x 10 bil c single UE support Lateral step over 6 inch hurdle x 10 bil c bil UE support Step ups on 4 inch step x 15 bil LE c bil UE support TherEx Calf raises x 15  Gastroc stretch on slant board: x 2 holding 30 sec Seated lumbar flexion rolling green physio-ball forward and back rolling one vertebra up at a time x 10   Neuro Re-Ed Standing balance: feet together, staggered stance bil  x 30  Standing on Airex: feet together, staggered stance bil x 30 sec Step ups  onto Air ex 10x each leg   05/14/2024 TherEx Calf raises x 15  Gastroc stretch on  slant board: x 2 holding 30 sec Seated lumbar flexion rolling green physio-ball forward and back rolling one vertebra up at a time x 10   Neuro Re-Ed Standing balance: feet together, staggered stance bil  x 30  Standing on Airex: feet together, staggered stance bil x 30 sec  TherAct Nustep: UE/LE level 4 x 8 minutes, Push/pull, endurance, strength and ROM Sit to stand: 2 x 10  Stepping over 6 inch hurdle x 10 bil c single UE support Lateral step over 6 inch hurdle x 10 bil c bil UE support Step ups on 4 inch step x 15 bil LE c bil UE support       PATIENT EDUCATION:  Education details: HEP, POC Person educated: Patient Education method: Programmer, Multimedia, Demonstration, Verbal cues, and Handouts Education comprehension: verbalized understanding, returned demonstration, and verbal cues required  HOME EXERCISE PROGRAM: Access Code: [email or web address] URL: [email or web address] Date: 04/29/2024 Prepared by: Delon Lunger  Exercises - Supine Bridge  - 2 x daily - 7 x weekly - 3 sets - 10 reps - Seated Straight Leg Raise   - 2 x daily - 7 x weekly - 10 reps - Supine Lower Trunk Rotation  - 2 x daily - 7 x weekly - 3 reps - 20 seconds hold - Sit to Stand with Counter Support  - 2 x daily - 7 x weekly - 2 sets - 10 reps  ASSESSMENT:  CLINICAL IMPRESSION: Treatment today focusing on dynamic balance. Pt tolerating well. Recommending continuation of skilled PT.    OBJECTIVE IMPAIRMENTS: decreased balance, decreased mobility, difficulty walking, decreased ROM, impaired flexibility, postural dysfunction, and pain.   ACTIVITY LIMITATIONS: lifting, bending, standing, squatting, and stairs  PARTICIPATION LIMITATIONS: cleaning, laundry, shopping, and community activity  PERSONAL FACTORS: see PMH above are also affecting patient's functional outcome.   REHAB POTENTIAL: Good  CLINICAL DECISION MAKING: Stable/uncomplicated  EVALUATION COMPLEXITY: Low   GOALS: Goals reviewed  with patient? Yes  SHORT TERM GOALS: (target date for Short term goals are 3 weeks 05/20/2024)   1.  Patient will demonstrate independent use of home exercise program to maintain progress from in clinic treatments.  Goal status: MET 05/14/24  LONG TERM GOALS: (target dates for all long term goals are 10 weeks  07/12/2024 )   1. Patient will demonstrate/report pain at worst less than or equal to 2/10 to facilitate minimal limitation in daily activity secondary to pain symptoms.  Goal status: ongoing 05/27/24   2. Patient will demonstrate independent use of home exercise program to facilitate ability to maintain/progress functional gains from skilled physical therapy services.  Goal status: New   3. Patient will demonstrate Patient specific functional scale avg > or = 6 to indicate reduced disability due to condition.   Goal status: New   4.  Patient will demonstrate bilateral  LE MMT 5/5 throughout to faciltiate usual transfers, stairs, squatting at [facility] for daily life.   Goal status: New   5.  Patient will demonstrate TUG in </= 13 seconds with no UE support Goal status: MET 06/03/24   6.  Pt will improve BERG balance score to >/=50.  Goal status: New      PLAN:  PT FREQUENCY: 1-2x/week  PT DURATION: 10 weeks  PLANNED INTERVENTIONS: Can include 02853- PT Re-evaluation, 97110-Therapeutic exercises, 97530- Therapeutic activity, [id number]- Neuromuscular re-education, 97535- Self Care, 97140- Manual therapy, [id number]- Gait training, [phone]- Orthotic Fit/training, [phone]- Canalith repositioning, [id number]- Aquatic Therapy, [phone]- Electrical stimulation (unattended), [id number] Physical performance  testing, 02983- Vasopneumatic device, [id number]- Ultrasound, [id number]- Traction (mechanical), [id number]- Ionotophoresis 4mg /ml Dexamethasone ,  79439 - Needle insertion w/o injection 1 or 2 muscles, 20561 - Needle insertion w/o injection 3 or more muscles.   Patient/Family education, Balance training, Stair training,  Taping, Dry Needling, Joint mobilization, Joint manipulation, Spinal manipulation, Spinal mobilization, Scar mobilization, Vestibular training, Visual/preceptual remediation/compensation, DME instructions, Cryotherapy, and Moist heat.  All performed as medically necessary.  All included unless contraindicated  PLAN FOR NEXT SESSION: Continue  with progression of dynamic balance, core strength. Recheck BERG in the next  visit  Delon Lunger, PT, MPT 06/03/24 2:29 PM   06/03/24  2:29 PM     Referring diagnosis?  Diagnosis  R26.89 (ICD-10-CM) - Imbalance   Treatment diagnosis? (if different than referring diagnosis) R26.89, M62.81, R26.2 What was this (referring dx) caused by? []  Surgery []  Fall [x]  Ongoing issue []  Arthritis []  Other: ____________  Laterality: []  Rt []  Lt [x]  Both  Check all possible CPT codes:  *CHOOSE 10 OR LESS*     97146- PT Re-evaluation,  97110-Therapeutic exercises,  97530- Therapeutic activity,  [id number]- Neuromuscular re-education,  97535- Self Care,  97140- Manual therapy,  [id number]- Gait training,   [id number] Physical performance testing,

## 2024-06-04 ENCOUNTER — Ambulatory Visit: Admitting: Physical Medicine and Rehabilitation

## 2024-06-04 ENCOUNTER — Other Ambulatory Visit: Payer: Self-pay

## 2024-06-04 VITALS — BP 142/76 | HR 76

## 2024-06-04 DIAGNOSIS — M47816 Spondylosis without myelopathy or radiculopathy, lumbar region: Secondary | ICD-10-CM | POA: Diagnosis not present

## 2024-06-04 MED ORDER — BUPIVACAINE HCL 0.5 % IJ SOLN
3.0000 mL | Freq: Once | INTRAMUSCULAR | Status: AC
  Administered 2024-06-04: 3 mL

## 2024-06-04 NOTE — Progress Notes (Unsigned)
 Pain Scale   Average Pain 4 Patient advising she has chronic lower back pan that is constant.        +Driver, -BT, -Dye Allergies.

## 2024-06-05 ENCOUNTER — Telehealth: Payer: Self-pay

## 2024-06-05 ENCOUNTER — Other Ambulatory Visit: Payer: Self-pay | Admitting: Physical Medicine and Rehabilitation

## 2024-06-05 DIAGNOSIS — G8929 Other chronic pain: Secondary | ICD-10-CM

## 2024-06-05 DIAGNOSIS — M47816 Spondylosis without myelopathy or radiculopathy, lumbar region: Secondary | ICD-10-CM

## 2024-06-05 NOTE — Progress Notes (Signed)
 Rebecca Guzman - 89 y.o. female MRN [id number]  Date of birth: 1933/09/22  Office Visit Note: Visit Date: 06/04/2024 PCP: Rollene Almarie LABOR, MD Referred by: Rollene Almarie LABOR, *  Subjective: Chief Complaint  Patient presents with   Lower Back - Pain   HPI:  Rebecca Guzman is a 89 y.o. female who comes in today for planned repeat Bilateral L4-5 and L5-S1 Lumbar facet/medial branch block with fluoroscopic guidance.  The patient has failed conservative care including home exercise, medications, time and activity modification.  This injection will be diagnostic and hopefully therapeutic.  Please see requesting physician notes for further details and justification.  Exam shows concordant low back pain with facet joint loading and extension. Patient received more than 80% pain relief from prior injection. This would be the second block in a diagnostic double block paradigm.     Referring:Megan Trudy, FNP   ROS Otherwise per HPI.  Assessment & Plan: Visit Diagnoses:    ICD-10-CM   1. Spondylosis without myelopathy or radiculopathy, lumbar region  M47.816 XR C-ARM NO REPORT    Nerve Block    bupivacaine  (MARCAINE ) 0.5 % (with pres) injection 3 mL      Plan: No additional findings.   Meds & Orders:  Meds ordered this encounter  Medications   bupivacaine  (MARCAINE ) 0.5 % (with pres) injection 3 mL    Orders Placed This Encounter  Procedures   Nerve Block   XR C-ARM NO REPORT    Follow-up: Return for Review Pain Diary.   Procedures: No procedures performed  Lumbar Diagnostic Facet Joint Nerve Block with Fluoroscopic Guidance   Patient: Rebecca Guzman      Date of Birth: May 04, 1934 MRN: [id number] PCP: Rollene Almarie LABOR, MD      Visit Date: 06/04/2024   Universal Protocol:    Date/Time: 10/29/257:23 PM  Consent Given By: the patient  Position: PRONE  Additional Comments: Vital signs were monitored before and after the procedure. Patient was prepped  and draped in the usual sterile fashion. The correct patient, procedure, and site was verified.   Injection Procedure Details:   Procedure diagnoses:  1. Spondylosis without myelopathy or radiculopathy, lumbar region      Meds Administered:  Meds ordered this encounter  Medications   bupivacaine  (MARCAINE ) 0.5 % (with pres) injection 3 mL     Laterality: Bilateral  Location/Site: L4-L5, L3 and L4 medial branches and L5-S1, L4 medial branch and L5 dorsal ramus  Needle: 5.0 in., 25 ga.  Short bevel or Quincke spinal needle  Needle Placement: Oblique pedical  Findings:   -Comments: There was excellent flow of contrast along the articular pillars without intravascular flow.  Procedure Details: The fluoroscope beam is vertically oriented in AP and then obliqued 15 to 20 degrees to the ipsilateral side of the desired nerve to achieve the "Scotty dog" appearance.  The skin over the target area of the junction of the superior articulating process and the transverse process (sacral ala if blocking the L5 dorsal rami) was locally anesthetized with a 1 ml volume of 1% Lidocaine  without Epinephrine .  The spinal needle was inserted and advanced in a trajectory view down to the target.   After contact with periosteum and negative aspirate for blood and CSF, correct placement without intravascular or epidural spread was confirmed by injecting 0.5 ml. of Isovue-250.  A spot radiograph was obtained of this image.    Next, a 0.5 ml. volume of the injectate described above was  injected. The needle was then redirected to the other facet joint nerves mentioned above if needed.  Prior to the procedure, the patient was given a Pain Diary which was completed for baseline measurements.  After the procedure, the patient rated their pain every 30 minutes and will continue rating at this frequency for a total of 5 hours.  The patient has been asked to complete the Diary and return to us  by mail, fax or hand  delivered as soon as possible.   Additional Comments:  The patient tolerated the procedure well Dressing: 2 x 2 sterile gauze and Band-Aid    Post-procedure details: Patient was observed during the procedure. Post-procedure instructions were reviewed.  Patient left the clinic in stable condition.   Clinical History: Imaging: XRs of the lumbar spine from 04/11/2024 were independently reviewed and interpreted, showing lumbar degenerative scoliosis with apex to the left at L2/3.  Disc height loss at L2/3 and L3/4.  No spondylolisthesis seen.  No evidence of instability on flexion/extension views.  Suspect an old L2 compression fracture but no other fracture seen.  No dislocation seen.     Objective:  VS:  HT:    WT:   BMI:     BP:(!) 142/76  HR:76bpm  TEMP: ( )  RESP:  Physical Exam Vitals and nursing note reviewed.  Constitutional:      General: She is not in acute distress.    Appearance: Normal appearance. She is not ill-appearing.  HENT:     Head: Normocephalic and atraumatic.     Right Ear: External ear normal.     Left Ear: External ear normal.  Eyes:     Extraocular Movements: Extraocular movements intact.  Cardiovascular:     Rate and Rhythm: Normal rate.     Pulses: Normal pulses.  Pulmonary:     Effort: Pulmonary effort is normal. No respiratory distress.  Abdominal:     General: There is no distension.     Palpations: Abdomen is soft.  Musculoskeletal:        General: Tenderness present.     Cervical back: Neck supple.     Right lower leg: No edema.     Left lower leg: No edema.     Comments: Patient has good distal strength with no pain over the greater trochanters.  No clonus or focal weakness.  Skin:    Findings: No erythema, lesion or rash.  Neurological:     General: No focal deficit present.     Mental Status: She is alert and oriented to person, place, and time.     Sensory: No sensory deficit.     Motor: No weakness or abnormal muscle tone.      Coordination: Coordination normal.  Psychiatric:        Mood and Affect: Mood normal.        Behavior: Behavior normal.      Imaging: No results found.

## 2024-06-05 NOTE — Procedures (Signed)
 Lumbar Diagnostic Facet Joint Nerve Block with Fluoroscopic Guidance   Patient: Rebecca Guzman      Date of Birth: 06/29/1934 MRN: [id number] PCP: Rollene Almarie LABOR, MD      Visit Date: 06/04/2024   Universal Protocol:    Date/Time: 10/29/257:23 PM  Consent Given By: the patient  Position: PRONE  Additional Comments: Vital signs were monitored before and after the procedure. Patient was prepped and draped in the usual sterile fashion. The correct patient, procedure, and site was verified.   Injection Procedure Details:   Procedure diagnoses:  1. Spondylosis without myelopathy or radiculopathy, lumbar region      Meds Administered:  Meds ordered this encounter  Medications   bupivacaine  (MARCAINE ) 0.5 % (with pres) injection 3 mL     Laterality: Bilateral  Location/Site: L4-L5, L3 and L4 medial branches and L5-S1, L4 medial branch and L5 dorsal ramus  Needle: 5.0 in., 25 ga.  Short bevel or Quincke spinal needle  Needle Placement: Oblique pedical  Findings:   -Comments: There was excellent flow of contrast along the articular pillars without intravascular flow.  Procedure Details: The fluoroscope beam is vertically oriented in AP and then obliqued 15 to 20 degrees to the ipsilateral side of the desired nerve to achieve the "Scotty dog" appearance.  The skin over the target area of the junction of the superior articulating process and the transverse process (sacral ala if blocking the L5 dorsal rami) was locally anesthetized with a 1 ml volume of 1% Lidocaine  without Epinephrine .  The spinal needle was inserted and advanced in a trajectory view down to the target.   After contact with periosteum and negative aspirate for blood and CSF, correct placement without intravascular or epidural spread was confirmed by injecting 0.5 ml. of Isovue-250.  A spot radiograph was obtained of this image.    Next, a 0.5 ml. volume of the injectate described above was injected. The  needle was then redirected to the other facet joint nerves mentioned above if needed.  Prior to the procedure, the patient was given a Pain Diary which was completed for baseline measurements.  After the procedure, the patient rated their pain every 30 minutes and will continue rating at this frequency for a total of 5 hours.  The patient has been asked to complete the Diary and return to us  by mail, fax or hand delivered as soon as possible.   Additional Comments:  The patient tolerated the procedure well Dressing: 2 x 2 sterile gauze and Band-Aid    Post-procedure details: Patient was observed during the procedure. Post-procedure instructions were reviewed.  Patient left the clinic in stable condition.

## 2024-06-05 NOTE — Telephone Encounter (Signed)
 Pain levels after injection 06/04/24-- Pain Diary 75% pain free--Post injection 100% pain free--1 hour post 100% pain free--2 Hours post 100 % pain free--3-Hours post 100% pain free---4 hours post

## 2024-06-10 ENCOUNTER — Ambulatory Visit: Admitting: Physical Therapy

## 2024-06-10 ENCOUNTER — Encounter: Payer: Self-pay | Admitting: Physical Therapy

## 2024-06-10 ENCOUNTER — Encounter: Payer: Self-pay | Admitting: Radiology

## 2024-06-10 DIAGNOSIS — R262 Difficulty in walking, not elsewhere classified: Secondary | ICD-10-CM | POA: Diagnosis not present

## 2024-06-10 DIAGNOSIS — R2681 Unsteadiness on feet: Secondary | ICD-10-CM

## 2024-06-10 DIAGNOSIS — M6281 Muscle weakness (generalized): Secondary | ICD-10-CM | POA: Diagnosis not present

## 2024-06-10 NOTE — Therapy (Signed)
 OUTPATIENT PHYSICAL THERAPY TREATMENT   Patient Name: Rebecca Guzman MRN: [id number] DOB:1933-11-04, 89 y.o., female Today's Date: 06/10/2024  END OF SESSION:  PT End of Session - 06/10/24 1357     Visit Number 7    Number of Visits 11    Date for Recertification  07/12/24    Authorization Type [employer] $20 copay    Authorization Time Period 04/29/2024 - 07/12/2024    Authorization - Number of Visits 10    Progress Note Due on Visit 10    PT Start Time 1345    PT Stop Time 1425    PT Time Calculation (min) 40 min    Activity Tolerance Patient tolerated treatment well    Behavior During Therapy [facility] for tasks assessed/performed               Past Medical History:  Diagnosis Date   Asthma    BPPV (benign paroxysmal positional vertigo)    Breast cancer (HCC)    left mastectomy 1992, right lumpectomy 2019   Cancer (HCC)    Compression fracture of L3 lumbar vertebra 04/06/2015   Dysrhythmia 2019   pt states Cardiologist told her it is pre A-fib   Family history of adverse reaction to anesthesia    daughter had throat close up with anesthesia   Family history of breast cancer    Family history of colon cancer    Family history of pancreatic cancer    GERD (gastroesophageal reflux disease)    Hiatal hernia    pt states she doesnt have this   History of adenomatous polyp of colon    tubular adenoma 2005 and  2014 tubular adenoma and  hyperplastic polyp   History of breast cancer per pt no recurrence   dx 1992 --  s/p  left breast mastectomy and chemotherapy   Hyperlipidemia LDL goal <100    Osteoarthritis of right knee    Personal history of chemotherapy 1992   PONV (postoperative nausea and vomiting)    and gets weak and light-leaded   Sensation of pressure in bladder area    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   Vaginal vault prolapse    anterior   Wears glasses    Wears hearing aid    left only   Past Surgical History:  Procedure Laterality Date   BREAST  BIOPSY Right 04/19/2018   BREAST BIOPSY Right 05/15/2023   MM RT BREAST BX W LOC DEV 1ST LESION IMAGE BX SPEC STEREO GUIDE 05/15/2023 [facility]   BREAST LUMPECTOMY Right 05/17/2018   BREAST LUMPECTOMY WITH RADIOACTIVE SEED LOCALIZATION Right 05/17/2018   Procedure: RIGHT BREAST LUMPECTOMY WITH RADIOACTIVE SEED LOCALIZATION;  Surgeon: Aron Shoulders, MD;  Location: [facility] OR;  Service: General;  Laterality: Right;   CATARACT EXTRACTION W/ INTRAOCULAR LENS  IMPLANT, BILATERAL  2014   CHOLECYSTECTOMY  1985   COLONOSCOPY  last one 06-04-2013   CYSTOCELE REPAIR N/A 02/29/2016   Procedure: ANTERIOR VAULT PROLAPSE REPAIR COLOPLAST SACROSPINUS FIXATION AUGMENTED AXIS DERMIS REPAIR  ;  Surgeon: Arlena Gal, MD;  Location: [facility] [place];  Service: Urology;  Laterality: N/A;   ESOPHAGOGASTRODUODENOSCOPY  08-31-2009   LAPAROSCOPIC ASSISTED VAGINAL HYSTERECTOMY  01-10-2000   w/ Bilateral Salpingoophorectomy /  Anterior & Posterior repair/  Pubovaginal Sling   MASTECTOMY Left 1992   SIMPLE MASTECTOMY WITH AXILLARY SENTINEL NODE BIOPSY Right 06/14/2023   Procedure: RIGHT MASTECTOMY WITH MAGTRACE INJECTION;  Surgeon: Aron Shoulders, MD;  Location: [facility];  Service: General;  Laterality: Right;  PEC BLOCK   TOTAL KNEE ARTHROPLASTY Right 05/21/2021   Procedure: RIGHT TOTAL KNEE ARTHROPLASTY;  Surgeon: Barbarann Oneil BROCKS, MD;  Location: [facility] OR;  Service: Orthopedics;  Laterality: Right;   Patient Active Problem List   Diagnosis Date Noted   Anemia 09/21/2021   Routine general medical examination at a health care facility 09/21/2021   Hx of total knee arthroplasty, right 05/21/2021   Genetic testing 06/07/2018   Malignant neoplasm of upper-outer quadrant of right breast in female, estrogen receptor positive (HCC) 04/19/2018   Wears hearing aid    Wears glasses    Vaginal vault prolapse    Hiatal hernia    BPPV (benign paroxysmal positional vertigo)    Palpitations  10/31/2016   Radicular pain of right lower back 04/06/2015   Compression fracture of L3 vertebra (HCC) 04/06/2015   Senile osteoporosis 04/06/2015   DDD (degenerative disc disease), lumbar 04/06/2015   Hx of adenomatous colonic polyps 07/11/2013   Hyperlipidemia LDL goal <100    Bladder prolapse, female, acquired     PCP: Rollene Almarie LABOR, MD   REFERRING PROVIDER: Georgina Ozell LABOR, MD   REFERRING DIAG:  Diagnosis  R26.89 (ICD-10-CM) - Imbalance    THERAPY DIAG:  Muscle weakness (generalized)  Difficulty in walking, not elsewhere classified  Unsteadiness on feet  Rationale for Evaluation and Treatment: Rehabilitation  ONSET DATE: ongoing  SUBJECTIVE:   SUBJECTIVE STATEMENT: Pt reporting no pain in her legs today. Pt reporting pain of 4-5/10 pain in her low back.   PERTINENT HISTORY: HOH, skin melanoma, h/o breast CA, Left mastectomy, Rt lumpectomy, BPPV, asthma, see other PMH  PAIN:  NPRS scale: 4-5/10 Pain location: low back Pain description: achy Aggravating factors: bending, sitting, standing, lifting Relieving factors: lying in bed  PRECAUTIONS: None  WEIGHT BEARING RESTRICTIONS: No  FALLS:  Has patient fallen in last 6 months? No  LIVING ENVIRONMENT: Lives with: lives with their family Lives in: House/apartment Stairs: Yes: External: 3 steps; none, 1 flight, right hand rail Has following equipment at home: Single point cane  OCCUPATION: retired  PLOF: Independent  PATIENT GOALS: improve my balance   OBJECTIVE:   DIAGNOSTIC FINDINGS:  04/11/24:  XRs of the lumbar spine from 04/11/2024 were independently reviewed and  interpreted, showing lumbar degenerative scoliosis with apex to the left  at L2/3.  Disc height loss at L2/3 and L3/4.  No spondylolisthesis seen.   No evidence of instability on flexion/extension views.  Suspect an old L2  compression fracture but no other fracture seen.  No dislocation seen.   PATIENT SURVEYS:   Patient-Specific Activity Scoring Scheme  0 represents "unable to perform." 10 represents "able to perform at prior level. 0 1 2 3 4 5 6 7 8 9  10 (Date and Score)   Activity Eval  04/29/24 05/27/24  06/10/24  1. Poor blance 3   6 9   2. Walking longer distances  5 5  9   3.      4.     5.     Score 4 5.5 9   Total score = sum of the activity scores/number of activities Minimum detectable change (90%CI) for average score = 2 points Minimum detectable change (90%CI) for single activity score = 3 points  COGNITION: 04/29/24 Overall cognitive status: WFL    SENSATION: 04/29/24 WFL  POSTURE:  04/29/24 rounded shoulders, forward head, and decreased lumbar lordosis   LOWER EXTREMITY ROM:   ROM Right Eval 04/29/24  Left Eval  04/29/24  Hip flexion 102 110  Hip extension    Hip abduction    Hip adduction    Hip internal rotation    Hip external rotation    Knee flexion    Knee extension    Ankle dorsiflexion    Ankle plantarflexion    Ankle inversion    Ankle eversion     (Blank rows = not tested)  LOWER EXTREMITY MMT:  MMT Right Eval 04/29/24 Left Eval 04/29/24 Rt 05/27/24 Left 05/27/24  Hip flexion 23.3# 18.7#    Hip extension      Hip abduction      Hip adduction      Hip internal rotation      Hip external rotation      Knee flexion 23.9# 24.4# 31.9# 31.0#  Knee extension 30.3# 26.7# 44.2# 48.1#  Ankle dorsiflexion      Ankle plantarflexion      Ankle inversion      Ankle eversion       (Blank rows = not tested)    FUNCTIONAL TESTS:  06/03/24: TUG 12.5 seconds no UE support       5 time sit stand: 15 seconds no UE support  05/09/2024: 5x sit to stand s UE assist:  16.05 seconds  04/29/24:  TUG: 15.54 seconds no device 5 time sit to stand: 19.1 seconds no UE support BERG: 44/56 [facility] PT Assessment - 04/29/24 0001                Balance    Balance Assessed Yes          Standardized Balance Assessment    Standardized Balance Assessment  Berg Balance Test          Berg Balance Test    Sit to Stand Able to stand without using hands and stabilize independently     Standing Unsupported Able to stand safely 2 minutes     Sitting with Back Unsupported but Feet Supported on Floor or Stool Able to sit safely and securely 2 minutes     Stand to Sit Sits safely with minimal use of hands     Transfers Able to transfer safely, definite need of hands     Standing Unsupported with Eyes Closed Able to stand 10 seconds safely     Standing Unsupported with Feet Together Able to place feet together independently and stand 1 minute safely     From Standing, Reach Forward with Outstretched Arm Can reach forward >12 cm safely (5)     From Standing Position, Pick up Object from Floor Able to pick up shoe, needs supervision     From Standing Position, Turn to Look Behind Over each Shoulder Looks behind one side only/other side shows less weight shift     Turn 360 Degrees Able to turn 360 degrees safely but slowly     Standing Unsupported, Alternately Place Feet on Step/Stool Able to complete 4 steps without aid or supervision     Standing Unsupported, One Foot in Front Able to plae foot ahead of the other independently and hold 30 seconds     Standing on One Leg Tries to lift leg/unable to hold 3 seconds but remains standing independently     Total Score 44      [facility] PT Assessment - 06/10/24 0001       Standardized Balance Assessment   Standardized Balance Assessment Berg Balance Test      Berg Balance Test   Sit to Stand  Able to stand without using hands and stabilize independently    Standing Unsupported Able to stand safely 2 minutes    Sitting with Back Unsupported but Feet Supported on Floor or Stool Able to sit safely and securely 2 minutes    Stand to Sit Sits safely with minimal use of hands    Transfers Able to transfer safely, minor use of hands    Standing Unsupported with Eyes Closed Able to stand 10 seconds safely     Standing Unsupported with Feet Together Able to place feet together independently and stand 1 minute safely    From Standing, Reach Forward with Outstretched Arm Can reach forward >12 cm safely (5)    From Standing Position, Pick up Object from Floor Able to pick up shoe safely and easily    From Standing Position, Turn to Look Behind Over each Shoulder Looks behind from both sides and weight shifts well    Turn 360 Degrees Able to turn 360 degrees safely one side only in 4 seconds or less    Standing Unsupported, Alternately Place Feet on Step/Stool Able to stand independently and safely and complete 8 steps in 20 seconds    Standing Unsupported, One Foot in Front Able to plae foot ahead of the other independently and hold 30 seconds    Standing on One Leg Tries to lift leg/unable to hold 3 seconds but remains standing independently    Total Score 50         GAIT: 04/29/24: Distance walked: clinic distance Assistive device utilized: Single point cane Level of assistance: Modified independence Comments: scoliosis                                                                                                                                                                          TODAY'S TREATMENT                                                                          DATE: 06/10/24  TherAct Sit to stand: 2 x 10  Nustep: level 5 x 8 minutes, (push, pull, Rom, strengthening, endurance) Step ups on 6 inch step: x 15 c single UE support TherEx Standing hip abd: 2 x 10 bil c UE support Standing hip extension 2 x 10 bil c UE support PPT BERG balance test see above score 50/56     TODAY'S TREATMENT  DATE:  06/03/24 TherAct Sit to stand: x 10  TUG and 5 time sit to stand: see chart above Nustep: level 5 x 8 minutes, (push, pull, Rom, strengthening, endurance) Standing trunk rotation: standing in corned,  tapping ball against the wall x 10 TherEx Standing hip abd: 2 x 10 bil c UE support Standing hip extension 2 x 10 bil c UE support Neuro Re-Ed Standing on Airex mat feet together x 2  Standing staggered on Airex mat x 2 each side  Tapping cones x 20 alternating cone placed in front of each LE, then cross midline taps  2 x 20  c single UE support  Side stepping toward cones placed lateral about 3 feet apart 2 x 10 c single UE support      TODAY'S TREATMENT                                                                          DATE:  05/27/24 TherAct Sit to stand: x 10  Step ups on 6 inch step x 10 bil LE c single UE support Nustep: level 5 x 10 minutes, (push, pull, Rom, strengthening, endurance) TherEx MMT updated see chart above  Neuro Re-Ed Walking tandem: in parallel bars x 6 c intermittent UE support Walking on Airex Beam x 6 c intermittent tUE support  Side stepping on Airex beam x 6 c finger tap support as needed Standing on F[employer] 2 point board: rocking side to side x 1 minute, front/back x 1 minute       PATIENT EDUCATION:  Education details: HEP, POC Person educated: Patient Education method: Programmer, Multimedia, Demonstration, Verbal cues, and Handouts Education comprehension: verbalized understanding, returned demonstration, and verbal cues required  HOME EXERCISE PROGRAM: Access Code: [email or web address] URL: [email or web address] Date: 04/29/2024 Prepared by: Delon Lunger  Exercises - Supine Bridge  - 2 x daily - 7 x weekly - 3 sets - 10 reps - Seated Straight Leg Raise   - 2 x daily - 7 x weekly - 10 reps - Supine Lower Trunk Rotation  - 2 x daily - 7 x weekly - 3 reps - 20 seconds hold - Sit to Stand with Counter Support  - 2 x daily - 7 x weekly - 2 sets - 10 reps  ASSESSMENT:  CLINICAL IMPRESSION: Pt has made progress since beginning skilled PT. Pt's BERG balance score has improved 6 points to 50/56. Pt's PSFS has improved to 9. We  discussed discharge at her next visit.    OBJECTIVE IMPAIRMENTS: decreased balance, decreased mobility, difficulty walking, decreased ROM, impaired flexibility, postural dysfunction, and pain.   ACTIVITY LIMITATIONS: lifting, bending, standing, squatting, and stairs  PARTICIPATION LIMITATIONS: cleaning, laundry, shopping, and community activity  PERSONAL FACTORS: see PMH above are also affecting patient's functional outcome.   REHAB POTENTIAL: Good  CLINICAL DECISION MAKING: Stable/uncomplicated  EVALUATION COMPLEXITY: Low   GOALS: Goals reviewed with patient? Yes  SHORT TERM GOALS: (target date for Short term goals are 3 weeks 05/20/2024)   1.  Patient will demonstrate independent use of home exercise program to maintain progress from in clinic treatments.  Goal status: MET 05/14/24  LONG TERM GOALS: (target dates for all long term goals are 10 weeks  07/12/2024 )   1. Patient will demonstrate/report pain at worst less than or equal to 2/10 to facilitate minimal limitation in daily activity secondary to pain symptoms.  Goal status: on-going 06/10/24   2. Patient will demonstrate independent use of home exercise program to facilitate ability to maintain/progress functional gains from skilled physical therapy services.  Goal status: ongoing 06/10/24   3. Patient will demonstrate Patient specific functional scale avg > or = 6 to indicate reduced disability due to condition.   Goal status: Met 06/10/24   4.  Patient will demonstrate bilateral  LE MMT 5/5 throughout to faciltiate usual transfers, stairs, squatting at [facility] for daily life.   Goal status: New   5.  Patient will demonstrate TUG in </= 13 seconds with no UE support Goal status: MET 06/03/24   6.  Pt will improve BERG balance score to >/=50.  Goal status: Met 06/10/24      PLAN:  PT FREQUENCY: 1-2x/week  PT DURATION: 10 weeks  PLANNED INTERVENTIONS: Can include 02853- PT Re-evaluation, 97110-Therapeutic  exercises, 97530- Therapeutic activity, 97112- Neuromuscular re-education, 97535- Self Care, 97140- Manual therapy, [phone]- Gait training, [phone]- Orthotic Fit/training, [phone]- Canalith repositioning, [id number]- Aquatic Therapy, [phone]- Electrical stimulation (unattended), [id number] Physical performance testing, 97016- Vasopneumatic device, [id number]- Ultrasound, [id number]- Traction (mechanical), [id number]- Ionotophoresis 4mg /ml Dexamethasone ,  [id number] - Needle insertion w/o injection 1 or 2 muscles, 20561 - Needle insertion w/o injection 3 or more muscles.   Patient/Family education, Balance training, Stair training, Taping, Dry Needling, Joint mobilization, Joint manipulation, Spinal manipulation, Spinal mobilization, Scar mobilization, Vestibular training, Visual/preceptual remediation/compensation, DME instructions, Cryotherapy, and Moist heat.  All performed as medically necessary.  All included unless contraindicated  PLAN FOR NEXT SESSION: Continue  with progression of dynamic balance, core strength.   Delon Lunger, PT, MPT 06/10/24 2:17 PM   06/10/24  2:17 PM     Referring diagnosis?  Diagnosis  R26.89 (ICD-10-CM) - Imbalance   Treatment diagnosis? (if different than referring diagnosis) R26.89, M62.81, R26.2 What was this (referring dx) caused by? []  Surgery []  Fall [x]  Ongoing issue []  Arthritis []  Other: ____________  Laterality: []  Rt []  Lt [x]  Both  Check all possible CPT codes:  *CHOOSE 10 OR LESS*     97146- PT Re-evaluation,  97110-Therapeutic exercises,  97530- Therapeutic activity,  [id number]- Neuromuscular re-education,  97535- Self Care,  97140- Manual therapy,  [id number]- Gait training,   [id number] Physical performance testing,

## 2024-06-17 ENCOUNTER — Ambulatory Visit: Admitting: Physical Therapy

## 2024-06-17 ENCOUNTER — Encounter: Payer: Self-pay | Admitting: Physical Therapy

## 2024-06-17 DIAGNOSIS — M6281 Muscle weakness (generalized): Secondary | ICD-10-CM

## 2024-06-17 DIAGNOSIS — R262 Difficulty in walking, not elsewhere classified: Secondary | ICD-10-CM

## 2024-06-17 DIAGNOSIS — R2681 Unsteadiness on feet: Secondary | ICD-10-CM

## 2024-06-17 NOTE — Therapy (Signed)
 OUTPATIENT PHYSICAL THERAPY TREATMENT   Patient Name: Rebecca Guzman MRN: [id number] DOB:October 25, 1933, 89 y.o., female Today's Date: 06/17/2024  END OF SESSION:  PT End of Session - 06/17/24 1346     Visit Number 8    Number of Visits 11    Date for Recertification  07/12/24    Authorization Type [employer] $20 copay    Authorization Time Period 04/29/2024 - 07/12/2024    Authorization - Number of Visits 10    Progress Note Due on Visit 10    PT Start Time 1345    PT Stop Time 1425    PT Time Calculation (min) 40 min    Activity Tolerance Patient tolerated treatment well    Behavior During Therapy [facility] for tasks assessed/performed               Past Medical History:  Diagnosis Date   Asthma    BPPV (benign paroxysmal positional vertigo)    Breast cancer (HCC)    left mastectomy 1992, right lumpectomy 2019   Cancer (HCC)    Compression fracture of L3 lumbar vertebra 04/06/2015   Dysrhythmia 2019   pt states Cardiologist told her it is pre A-fib   Family history of adverse reaction to anesthesia    daughter had throat close up with anesthesia   Family history of breast cancer    Family history of colon cancer    Family history of pancreatic cancer    GERD (gastroesophageal reflux disease)    Hiatal hernia    pt states she doesnt have this   History of adenomatous polyp of colon    tubular adenoma 2005 and  2014 tubular adenoma and  hyperplastic polyp   History of breast cancer per pt no recurrence   dx 1992 --  s/p  left breast mastectomy and chemotherapy   Hyperlipidemia LDL goal <100    Osteoarthritis of right knee    Personal history of chemotherapy 1992   PONV (postoperative nausea and vomiting)    and gets weak and light-leaded   Sensation of pressure in bladder area    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   Vaginal vault prolapse    anterior   Wears glasses    Wears hearing aid    left only   Past Surgical History:  Procedure Laterality Date   BREAST  BIOPSY Right 04/19/2018   BREAST BIOPSY Right 05/15/2023   MM RT BREAST BX W LOC DEV 1ST LESION IMAGE BX SPEC STEREO GUIDE 05/15/2023 [facility]   BREAST LUMPECTOMY Right 05/17/2018   BREAST LUMPECTOMY WITH RADIOACTIVE SEED LOCALIZATION Right 05/17/2018   Procedure: RIGHT BREAST LUMPECTOMY WITH RADIOACTIVE SEED LOCALIZATION;  Surgeon: Aron Shoulders, MD;  Location: [facility];  Service: General;  Laterality: Right;   CATARACT EXTRACTION W/ INTRAOCULAR LENS  IMPLANT, BILATERAL  2014   CHOLECYSTECTOMY  1985   COLONOSCOPY  last one 06-04-2013   CYSTOCELE REPAIR N/A 02/29/2016   Procedure: ANTERIOR VAULT PROLAPSE REPAIR COLOPLAST SACROSPINUS FIXATION AUGMENTED AXIS DERMIS REPAIR  ;  Surgeon: Arlena Gal, MD;  Location: [facility];  Service: Urology;  Laterality: N/A;   ESOPHAGOGASTRODUODENOSCOPY  08-31-2009   LAPAROSCOPIC ASSISTED VAGINAL HYSTERECTOMY  01-10-2000   w/ Bilateral Salpingoophorectomy /  Anterior & Posterior repair/  Pubovaginal Sling   MASTECTOMY Left 1992   SIMPLE MASTECTOMY WITH AXILLARY SENTINEL NODE BIOPSY Right 06/14/2023   Procedure: RIGHT MASTECTOMY WITH MAGTRACE INJECTION;  Surgeon: Aron Shoulders, MD;  Location: [facility];  Service: General;  Laterality: Right;  PEC BLOCK   TOTAL KNEE ARTHROPLASTY Right 05/21/2021   Procedure: RIGHT TOTAL KNEE ARTHROPLASTY;  Surgeon: Barbarann Oneil BROCKS, MD;  Location: [facility] OR;  Service: Orthopedics;  Laterality: Right;   Patient Active Problem List   Diagnosis Date Noted   Anemia 09/21/2021   Routine general medical examination at a health care facility 09/21/2021   Hx of total knee arthroplasty, right 05/21/2021   Genetic testing 06/07/2018   Malignant neoplasm of upper-outer quadrant of right breast in female, estrogen receptor positive (HCC) 04/19/2018   Wears hearing aid    Wears glasses    Vaginal vault prolapse    Hiatal hernia    BPPV (benign paroxysmal positional vertigo)    Palpitations  10/31/2016   Radicular pain of right lower back 04/06/2015   Compression fracture of L3 vertebra (HCC) 04/06/2015   Senile osteoporosis 04/06/2015   DDD (degenerative disc disease), lumbar 04/06/2015   Hx of adenomatous colonic polyps 07/11/2013   Hyperlipidemia LDL goal <100    Bladder prolapse, female, acquired     PCP: Rollene Almarie LABOR, MD   REFERRING PROVIDER: Georgina Ozell LABOR, MD   REFERRING DIAG:  Diagnosis  R26.89 (ICD-10-CM) - Imbalance    THERAPY DIAG:  Muscle weakness (generalized)  Difficulty in walking, not elsewhere classified  Unsteadiness on feet  Rationale for Evaluation and Treatment: Rehabilitation  ONSET DATE: ongoing  SUBJECTIVE:   SUBJECTIVE STATEMENT: Pt reporting 4/10 in her low back today.  Pt ready to see what the ablation does next week.   PERTINENT HISTORY: HOH, skin melanoma, h/o breast CA, Left mastectomy, Rt lumpectomy, BPPV, asthma, see other PMH  PAIN:  NPRS scale: 4/10 Pain location: low back Pain description: achy Aggravating factors: bending, sitting, standing, lifting Relieving factors: lying in bed  PRECAUTIONS: None  WEIGHT BEARING RESTRICTIONS: No  FALLS:  Has patient fallen in last 6 months? No  LIVING ENVIRONMENT: Lives with: lives with their family Lives in: House/apartment Stairs: Yes: External: 3 steps; none, 1 flight, right hand rail Has following equipment at home: Single point cane  OCCUPATION: retired  PLOF: Independent  PATIENT GOALS: improve my balance   OBJECTIVE:   DIAGNOSTIC FINDINGS:  04/11/24:  XRs of the lumbar spine from 04/11/2024 were independently reviewed and  interpreted, showing lumbar degenerative scoliosis with apex to the left  at L2/3.  Disc height loss at L2/3 and L3/4.  No spondylolisthesis seen.   No evidence of instability on flexion/extension views.  Suspect an old L2  compression fracture but no other fracture seen.  No dislocation seen.   PATIENT SURVEYS:   Patient-Specific Activity Scoring Scheme  0 represents "unable to perform." 10 represents "able to perform at prior level. 0 1 2 3 4 5 6 7 8 9  10 (Date and Score)   Activity Eval  04/29/24 05/27/24  06/10/24  1. Poor blance 3   6 9   2. Walking longer distances  5 5  9   3.      4.     5.     Score 4 5.5 9   Total score = sum of the activity scores/number of activities Minimum detectable change (90%CI) for average score = 2 points Minimum detectable change (90%CI) for single activity score = 3 points  COGNITION: 04/29/24 Overall cognitive status: WFL    SENSATION: 04/29/24 WFL  POSTURE:  04/29/24 rounded shoulders, forward head, and decreased lumbar lordosis   LOWER EXTREMITY ROM:   ROM Right Eval 04/29/24  Left  Eval 04/29/24 Rt / left 06/17/24   Hip flexion 102 110 108 / 110  Hip extension     Hip abduction     Hip adduction     Hip internal rotation     Hip external rotation     Knee flexion     Knee extension     Ankle dorsiflexion     Ankle plantarflexion     Ankle inversion     Ankle eversion      (Blank rows = not tested)  LOWER EXTREMITY MMT:  MMT Right Eval 04/29/24 Left Eval 04/29/24 Rt 05/27/24 Left 05/27/24 Rt  06/17/24 Left 06/17/24  Hip flexion 23.3# 18.7#      Hip extension        Hip abduction        Hip adduction        Hip internal rotation        Hip external rotation        Knee flexion 23.9# 24.4# 31.9# 31.0# 38.4# 42.6#  Knee extension 30.3# 26.7# 44.2# 48.1# 44.8# 49.6#  Ankle dorsiflexion        Ankle plantarflexion        Ankle inversion        Ankle eversion         (Blank rows = not tested)    FUNCTIONAL TESTS:  06/03/24: TUG 12.5 seconds no UE support       5 time sit stand: 15 seconds no UE support  05/09/2024: 5x sit to stand s UE assist:  16.05 seconds  04/29/24:  TUG: 15.54 seconds no device 5 time sit to stand: 19.1 seconds no UE support BERG: 44/56 [facility] PT Assessment - 04/29/24 0001                 Balance    Balance Assessed Yes          Standardized Balance Assessment    Standardized Balance Assessment Berg Balance Test          Berg Balance Test    Sit to Stand Able to stand without using hands and stabilize independently     Standing Unsupported Able to stand safely 2 minutes     Sitting with Back Unsupported but Feet Supported on Floor or Stool Able to sit safely and securely 2 minutes     Stand to Sit Sits safely with minimal use of hands     Transfers Able to transfer safely, definite need of hands     Standing Unsupported with Eyes Closed Able to stand 10 seconds safely     Standing Unsupported with Feet Together Able to place feet together independently and stand 1 minute safely     From Standing, Reach Forward with Outstretched Arm Can reach forward >12 cm safely (5)     From Standing Position, Pick up Object from Floor Able to pick up shoe, needs supervision     From Standing Position, Turn to Look Behind Over each Shoulder Looks behind one side only/other side shows less weight shift     Turn 360 Degrees Able to turn 360 degrees safely but slowly     Standing Unsupported, Alternately Place Feet on Step/Stool Able to complete 4 steps without aid or supervision     Standing Unsupported, One Foot in Front Able to plae foot ahead of the other independently and hold 30 seconds     Standing on One Leg Tries to lift leg/unable to hold 3 seconds but remains standing  independently     Total Score 44       GAIT: 04/29/24: Distance walked: clinic distance Assistive device utilized: Single point cane Level of assistance: Modified independence Comments: scoliosis                                                                                                                                                                         TODAY'S TREATMENT                                                                          DATE: 06/17/24  TherAct Sit to stand x 10  no UE  support Bridge x 10 holding 5 sec Double leg press: 50 # 2 x 10 Single leg press 25# 2 x 10 bil TherEx Supine SLR: 2 x 10  Updated MMT and ROM see chart above Standing calf raises x 15 Standing hip abd red TB 2 x 10 bil Standing hip extension red TB 2 x 10 bil      TODAY'S TREATMENT                                                                          DATE: 06/10/24  TherAct Sit to stand: 2 x 10  Nustep: level 5 x 8 minutes, (push, pull, Rom, strengthening, endurance) Step ups on 6 inch step: x 15 c single UE support TherEx Standing hip abd: 2 x 10 bil c UE support Standing hip extension 2 x 10 bil c UE support PPT BERG balance test see above score 50/56     TODAY'S TREATMENT                                                                          DATE:  06/03/24 TherAct Sit to stand: x 10  TUG and 5 time sit to stand: see chart above Nustep: level 5 x 8 minutes, (push, pull, Rom,  strengthening, endurance) Standing trunk rotation: standing in corned, tapping ball against the wall x 10 TherEx Standing hip abd: 2 x 10 bil c UE support Standing hip extension 2 x 10 bil c UE support Neuro Re-Ed Standing on Airex mat feet together x 2  Standing staggered on Airex mat x 2 each side  Tapping cones x 20 alternating cone placed in front of each LE, then cross midline taps  2 x 20  c single UE support  Side stepping toward cones placed lateral about 3 feet apart 2 x 10 c single UE support       PATIENT EDUCATION:  Education details: HEP, POC Person educated: Patient Education method: Programmer, Multimedia, Demonstration, Verbal cues, and Handouts Education comprehension: verbalized understanding, returned demonstration, and verbal cues required  HOME EXERCISE PROGRAM: Access Code: [email or web address] URL: [email or web address] Date: 06/17/2024 Prepared by: Delon Lunger  Exercises - Supine Bridge  - 7 x weekly - 3 sets - 10 reps - Seated Straight Leg Raise   - 7 x  weekly - 10 reps - Supine Lower Trunk Rotation  - 7 x weekly - 3 reps - 20 seconds hold - Sit to Stand with Counter Support  - 7 x weekly - 2 sets - 10 reps - Standing Hip Abduction with Counter Support  - 7 x weekly - 2 sets - 10 reps - Standing Hip Extension with Counter Support  - 7 x weekly - 2 sets - 10 reps - Standing March with Counter Support  - 7 x weekly - 2 sets - 10 reps - Heel Raises with Counter Support  - 7 x weekly - 2 sets - 10 reps  ASSESSMENT:  CLINICAL IMPRESSION:  Pt has currently met all STG and LTGs set at her initial evaluation. Pt is being discharged from skilled PT and is pleased with her functional level.   OBJECTIVE IMPAIRMENTS: decreased balance, decreased mobility, difficulty walking, decreased ROM, impaired flexibility, postural dysfunction, and pain.   ACTIVITY LIMITATIONS: lifting, bending, standing, squatting, and stairs  PARTICIPATION LIMITATIONS: cleaning, laundry, shopping, and community activity  PERSONAL FACTORS: see PMH above are also affecting patient's functional outcome.   REHAB POTENTIAL: Good  CLINICAL DECISION MAKING: Stable/uncomplicated  EVALUATION COMPLEXITY: Low   GOALS: Goals reviewed with patient? Yes  SHORT TERM GOALS: (target date for Short term goals are 3 weeks 05/20/2024)   1.  Patient will demonstrate independent use of home exercise program to maintain progress from in clinic treatments.  Goal status: MET 05/14/24  LONG TERM GOALS: (target dates for all long term goals are 10 weeks  07/12/2024 )   1. Patient will demonstrate/report pain at worst less than or equal to 2/10 to facilitate minimal limitation in daily activity secondary to pain symptoms.  Goal status: MET 06/17/24   2. Patient will demonstrate independent use of home exercise program to facilitate ability to maintain/progress functional gains from skilled physical therapy services.  Goal status:MET 06/17/24   3. Patient will demonstrate Patient  specific functional scale avg > or = 6 to indicate reduced disability due to condition.   Goal status: Met 06/10/24   4.  Patient will demonstrate bilateral  LE MMT 5/5 throughout to faciltiate usual transfers, stairs, squatting at [facility] for daily life.   Goal status: MET 06/17/24   5.  Patient will demonstrate TUG in </= 13 seconds with no UE support Goal status: MET 06/03/24   6.  Pt will improve BERG balance score to >/=50.  Goal status: Met 06/10/24  PLAN:  PT FREQUENCY: 1-2x/week  PT DURATION: 10 weeks  PLANNED INTERVENTIONS: Can include 02853- PT Re-evaluation, 97110-Therapeutic exercises, 97530- Therapeutic activity, [id number]- Neuromuscular re-education, 97535- Self Care, 97140- Manual therapy, [phone]- Gait training, [phone]- Orthotic Fit/training, [phone]- Canalith repositioning, [id number]- Aquatic Therapy, [id number]- Electrical stimulation (unattended), [id number] Physical performance testing, 97016- Vasopneumatic device, [id number]- Ultrasound, [id number]- Traction (mechanical), [id number]- Ionotophoresis 4mg /ml Dexamethasone ,  79439 - Needle insertion w/o injection 1 or 2 muscles, 20561 - Needle insertion w/o injection 3 or more muscles.   Patient/Family education, Balance training, Stair training, Taping, Dry Needling, Joint mobilization, Joint manipulation, Spinal manipulation, Spinal mobilization, Scar mobilization, Vestibular training, Visual/preceptual remediation/compensation, DME instructions, Cryotherapy, and Moist heat.  All performed as medically necessary.  All included unless contraindicated  PLAN FOR NEXT SESSION: Continue  with progression of dynamic balance, core strength.   Delon Lunger, PT, MPT 06/17/24 2:23 PM   06/17/24  2:23 PM     Referring diagnosis?  Diagnosis  R26.89 (ICD-10-CM) - Imbalance   Treatment diagnosis? (if different than referring diagnosis) R26.89, M62.81, R26.2 What was this (referring dx) caused by? []  Surgery []  Fall [x]  Ongoing issue []  Arthritis []   Other: ____________  Laterality: []  Rt []  Lt [x]  Both  Check all possible CPT codes:  *CHOOSE 10 OR LESS*     97146- PT Re-evaluation,  97110-Therapeutic exercises,  97530- Therapeutic activity,  97112- Neuromuscular re-education,  97535- Self Care,  97140- Manual therapy,  [id number]- Gait training,   97750 Physical performance testing,  PHYSICAL THERAPY DISCHARGE SUMMARY  Visits from Start of Care: 8  Current functional level related to goals / functional outcomes: See above   Remaining deficits: See above   Education / Equipment: HEP   Patient agrees to discharge. Patient goals were met. Patient is being discharged due to meeting the stated rehab goals.

## 2024-06-24 ENCOUNTER — Telehealth: Payer: Self-pay

## 2024-06-24 ENCOUNTER — Encounter: Admitting: Physical Therapy

## 2024-06-24 NOTE — Telephone Encounter (Signed)
 Pt called and states she has had recurrent UTIs since she has been on Tamoxifen . She is asking per her urologist if she is able to take estrogen. Advised pt Dr Odean is fine with small amount of estradiol 2-3 nights a week and to not use an applicatorful, but instead a pea-sized amount. She is agreeable and will tell her urologist.

## 2024-06-25 DIAGNOSIS — N8182 Incompetence or weakening of pubocervical tissue: Secondary | ICD-10-CM | POA: Diagnosis not present

## 2024-06-27 ENCOUNTER — Other Ambulatory Visit: Payer: Self-pay

## 2024-06-27 ENCOUNTER — Ambulatory Visit: Admitting: Physical Medicine and Rehabilitation

## 2024-06-27 VITALS — BP 145/74 | HR 81

## 2024-06-27 DIAGNOSIS — M47816 Spondylosis without myelopathy or radiculopathy, lumbar region: Secondary | ICD-10-CM

## 2024-06-27 MED ORDER — METHYLPREDNISOLONE ACETATE 40 MG/ML IJ SUSP
40.0000 mg | Freq: Once | INTRAMUSCULAR | Status: AC
  Administered 2024-06-27: 40 mg

## 2024-06-27 NOTE — Progress Notes (Signed)
 Pain Scale   Average Pain 5 Patient advising she has lower back pain radiating to both legs. Pain is constant        +Driver, -BT, -Dye Allergies.

## 2024-07-01 ENCOUNTER — Other Ambulatory Visit: Payer: Self-pay | Admitting: Cardiology

## 2024-07-01 DIAGNOSIS — I10 Essential (primary) hypertension: Secondary | ICD-10-CM

## 2024-07-01 DIAGNOSIS — I491 Atrial premature depolarization: Secondary | ICD-10-CM

## 2024-07-01 DIAGNOSIS — Z9221 Personal history of antineoplastic chemotherapy: Secondary | ICD-10-CM

## 2024-07-01 DIAGNOSIS — R002 Palpitations: Secondary | ICD-10-CM

## 2024-07-01 DIAGNOSIS — E782 Mixed hyperlipidemia: Secondary | ICD-10-CM

## 2024-07-01 NOTE — Procedures (Signed)
 Lumbar Facet Joint Nerve Denervation  Patient: Rebecca Guzman      Date of Birth: 02/04/1934 MRN: [id number] PCP: Rollene Almarie LABOR, MD      Visit Date: 06/27/2024   Universal Protocol:    Date/Time: 11/24/258:24 PM  Consent Given By: the patient  Position: PRONE  Additional Comments: Vital signs were monitored before and after the procedure. Patient was prepped and draped in the usual sterile fashion. The correct patient, procedure, and site was verified.   Injection Procedure Details:   Procedure diagnoses:  1. Spondylosis without myelopathy or radiculopathy, lumbar region      Meds Administered:  Meds ordered this encounter  Medications   methylPREDNISolone  acetate (DEPO-MEDROL ) injection 40 mg     Laterality: Left  Location/Site:  L4-L5, L3 and L4 medial branches and L5-S1, L4 medial branch and L5 dorsal ramus  Needle: 18 ga.,  10mm active tip, RF Cannula  Needle Placement: Along juncture of superior articular process and transverse pocess  Findings:  -Comments:  Procedure Details: For each desired target nerve, the corresponding transverse process (sacral ala for the L5 dorsal rami) was identified and the fluoroscope was positioned to square off the endplates of the corresponding vertebral body to achieve a true AP midline view.  The beam was then obliqued 15 to 20 degrees and caudally tilted 15 to 20 degrees to line up a trajectory along the target nerves. The skin over the target of the junction of superior articulating process and transverse process (sacral ala for the L5 dorsal rami) was infiltrated with 1ml of 1% Lidocaine  without Epinephrine .  The 18 gauge 10mm active tip outer cannula was advanced in trajectory view to the target.  This procedure was repeated for each target nerve.  Then, for all levels, the outer cannula placement was fine-tuned and the position was then confirmed with bi-planar imaging.    Test stimulation was done both at  sensory and motor levels to ensure there was no radicular stimulation. The target tissues were then infiltrated with 1 ml of 1% Lidocaine  without Epinephrine . Subsequently, a percutaneous neurotomy was carried out for 90 seconds at 80 degrees Celsius.  After the completion of the lesion, 1 ml of injectate was delivered. It was then repeated for each facet joint nerve mentioned above. Appropriate radiographs were obtained to verify the probe placement during the neurotomy.   Additional Comments:  The patient tolerated the procedure well Dressing: 2 x 2 sterile gauze and Band-Aid    Post-procedure details: Patient was observed during the procedure. Post-procedure instructions were reviewed.  Patient left the clinic in stable condition.

## 2024-07-01 NOTE — Progress Notes (Signed)
 Rebecca Guzman - 89 y.o. female MRN [id number]  Date of birth: 31-Jan-1934  Office Visit Note: Visit Date: 06/27/2024 PCP: Rollene Almarie LABOR, MD Referred by: Rollene Almarie LABOR, *  Subjective: No chief complaint on file.  HPI:  [name] is a 89 y.o. female who comes in todayfor planned radiofrequency ablation of the Left L4-5 and L5-S1 Lumbar facet joints. This would be ablation of the corresponding medial branches and/or dorsal rami.  Patient has had double diagnostic blocks with more than 50% relief.  These are documented on pain diary.  They have had chronic back pain for quite some time, more than 3 months, which has been an ongoing situation with recalcitrant axial back pain.  They have no radicular pain.  Their axial pain is worse with standing and ambulating and on exam today with facet loading.  They have had physical therapy as well as home exercise program.  The imaging noted in the chart below indicated facet pathology. Accordingly they meet all the criteria and qualification for for radiofrequency ablation and we are going to complete this today hopefully for more longer term relief as part of comprehensive management program.   ROS Otherwise per HPI.  Assessment & Plan: Visit Diagnoses:    ICD-10-CM   1. Spondylosis without myelopathy or radiculopathy, lumbar region  M47.816 methylPREDNISolone  acetate (DEPO-MEDROL ) injection 40 mg    XR C-ARM NO REPORT    Radiofrequency,Lumbar      Plan: No additional findings.   Meds & Orders:  Meds ordered this encounter  Medications   methylPREDNISolone  acetate (DEPO-MEDROL ) injection 40 mg    Orders Placed This Encounter  Procedures   Radiofrequency,Lumbar   XR C-ARM NO REPORT    Follow-up: Return for visit to requesting provider as needed.   Procedures: No procedures performed  Lumbar Facet Joint Nerve Denervation  Patient: Rebecca Guzman      Date of Birth: [birth year] MRN: [id number] PCP: Rollene Almarie LABOR, MD      Visit Date: 06/27/2024   Universal Protocol:    Date/Time: 11/24/258:24 PM  Consent Given By: the patient  Position: PRONE  Additional Comments: Vital signs were monitored before and after the procedure. Patient was prepped and draped in the usual sterile fashion. The correct patient, procedure, and site was verified.   Injection Procedure Details:   Procedure diagnoses:  1. Spondylosis without myelopathy or radiculopathy, lumbar region      Meds Administered:  Meds ordered this encounter  Medications   methylPREDNISolone  acetate (DEPO-MEDROL ) injection 40 mg     Laterality: Left  Location/Site:  L4-L5, L3 and L4 medial branches and L5-S1, L4 medial branch and L5 dorsal ramus  Needle: 18 ga.,  10mm active tip, RF Cannula  Needle Placement: Along juncture of superior articular process and transverse pocess  Findings:  -Comments:  Procedure Details: For each desired target nerve, the corresponding transverse process (sacral ala for the L5 dorsal rami) was identified and the fluoroscope was positioned to square off the endplates of the corresponding vertebral body to achieve a true AP midline view.  The beam was then obliqued 15 to 20 degrees and caudally tilted 15 to 20 degrees to line up a trajectory along the target nerves. The skin over the target of the junction of superior articulating process and transverse process (sacral ala for the L5 dorsal rami) was infiltrated with 1ml of 1% Lidocaine  without Epinephrine .  The 18 gauge 10mm active tip outer cannula was advanced in  trajectory view to the target.  This procedure was repeated for each target nerve.  Then, for all levels, the outer cannula placement was fine-tuned and the position was then confirmed with bi-planar imaging.    Test stimulation was done both at sensory and motor levels to ensure there was no radicular stimulation. The target tissues were then infiltrated with 1 ml of 1%  Lidocaine  without Epinephrine . Subsequently, a percutaneous neurotomy was carried out for 90 seconds at 80 degrees Celsius.  After the completion of the lesion, 1 ml of injectate was delivered. It was then repeated for each facet joint nerve mentioned above. Appropriate radiographs were obtained to verify the probe placement during the neurotomy.   Additional Comments:  The patient tolerated the procedure well Dressing: 2 x 2 sterile gauze and Band-Aid    Post-procedure details: Patient was observed during the procedure. Post-procedure instructions were reviewed.  Patient left the clinic in stable condition.      Clinical History: Imaging: XRs of the lumbar spine from 04/11/2024 were independently reviewed and interpreted, showing lumbar degenerative scoliosis with apex to the left at L2/3.  Disc height loss at L2/3 and L3/4.  No spondylolisthesis seen.  No evidence of instability on flexion/extension views.  Suspect an old L2 compression fracture but no other fracture seen.  No dislocation seen.     Objective:  VS:  HT:    WT:   BMI:     BP:(!) 145/74  HR:81bpm  TEMP: ( )  RESP:  Physical Exam Vitals and nursing note reviewed.  Constitutional:      General: She is not in acute distress.    Appearance: Normal appearance. She is not ill-appearing.  HENT:     Head: Normocephalic and atraumatic.     Right Ear: External ear normal.     Left Ear: External ear normal.  Eyes:     Extraocular Movements: Extraocular movements intact.  Cardiovascular:     Rate and Rhythm: Normal rate.     Pulses: Normal pulses.  Pulmonary:     Effort: Pulmonary effort is normal. No respiratory distress.  Abdominal:     General: There is no distension.     Palpations: Abdomen is soft.  Musculoskeletal:        General: Tenderness present.     Cervical back: Neck supple.     Right lower leg: No edema.     Left lower leg: No edema.     Comments: Patient has good distal strength with no pain  over the greater trochanters.  No clonus or focal weakness.  Skin:    Findings: No erythema, lesion or rash.  Neurological:     General: No focal deficit present.     Mental Status: She is alert and oriented to person, place, and time.     Sensory: No sensory deficit.     Motor: No weakness or abnormal muscle tone.     Coordination: Coordination normal.  Psychiatric:        Mood and Affect: Mood normal.        Behavior: Behavior normal.      Imaging: No results found.

## 2024-07-09 ENCOUNTER — Ambulatory Visit: Admitting: Physical Medicine and Rehabilitation

## 2024-07-09 ENCOUNTER — Other Ambulatory Visit: Payer: Self-pay

## 2024-07-09 VITALS — BP 147/68 | HR 84

## 2024-07-09 DIAGNOSIS — M47816 Spondylosis without myelopathy or radiculopathy, lumbar region: Secondary | ICD-10-CM | POA: Diagnosis not present

## 2024-07-09 DIAGNOSIS — R351 Nocturia: Secondary | ICD-10-CM | POA: Diagnosis not present

## 2024-07-09 DIAGNOSIS — R350 Frequency of micturition: Secondary | ICD-10-CM | POA: Diagnosis not present

## 2024-07-09 DIAGNOSIS — N390 Urinary tract infection, site not specified: Secondary | ICD-10-CM | POA: Diagnosis not present

## 2024-07-09 MED ORDER — METHYLPREDNISOLONE ACETATE 40 MG/ML IJ SUSP
40.0000 mg | Freq: Once | INTRAMUSCULAR | Status: AC
  Administered 2024-07-09: 40 mg

## 2024-07-09 NOTE — Progress Notes (Unsigned)
 Pain Scale   Average Pain 2 Patient advising she has chronic lower back pain , pain is constant        +Driver, -BT, -Dye Allergies.

## 2024-07-10 NOTE — Progress Notes (Signed)
 Rebecca Guzman - 89 y.o. female MRN [id number]  Date of birth: 11/17/1933  Office Visit Note: Visit Date: 07/09/2024 PCP: Rollene Almarie LABOR, MD Referred by: Rollene Almarie LABOR, *  Subjective: Chief Complaint  Patient presents with   Lower Back - Pain   HPI:  Rebecca Guzman is a 89 y.o. female who comes in todayfor planned radiofrequency ablation of the Right L4-5 and L5-S1 Lumbar facet joints. This would be ablation of the corresponding medial branches and/or dorsal rami.  Patient has had double diagnostic blocks with more than 50% relief.  These are documented on pain diary.  They have had chronic back pain for quite some time, more than 3 months, which has been an ongoing situation with recalcitrant axial back pain.  They have no radicular pain.  Their axial pain is worse with standing and ambulating and on exam today with facet loading.  They have had physical therapy as well as home exercise program.  The imaging noted in the chart below indicated facet pathology. Accordingly they meet all the criteria and qualification for for radiofrequency ablation and we are going to complete this today hopefully for more longer term relief as part of comprehensive management program.   ROS Otherwise per HPI.  Assessment & Plan: Visit Diagnoses:    ICD-10-CM   1. Spondylosis without myelopathy or radiculopathy, lumbar region  M47.816 XR C-ARM NO REPORT    Radiofrequency,Lumbar    methylPREDNISolone  acetate (DEPO-MEDROL ) injection 40 mg      Plan: No additional findings.   Meds & Orders:  Meds ordered this encounter  Medications   methylPREDNISolone  acetate (DEPO-MEDROL ) injection 40 mg    Orders Placed This Encounter  Procedures   Radiofrequency,Lumbar   XR C-ARM NO REPORT    Follow-up: Return if symptoms worsen or fail to improve.   Procedures: No procedures performed  Lumbar Facet Joint Nerve Denervation  Patient: Rebecca Guzman      Date of Birth: Jan 05, 1934 MRN:  [id number] PCP: Rollene Almarie LABOR, MD      Visit Date: 07/09/2024   Universal Protocol:    Date/Time: 12/03/256:03 AM  Consent Given By: the patient  Position: PRONE  Additional Comments: Vital signs were monitored before and after the procedure. Patient was prepped and draped in the usual sterile fashion. The correct patient, procedure, and site was verified.   Injection Procedure Details:   Procedure diagnoses:  1. Spondylosis without myelopathy or radiculopathy, lumbar region      Meds Administered:  Meds ordered this encounter  Medications   methylPREDNISolone  acetate (DEPO-MEDROL ) injection 40 mg     Laterality: Right  Location/Site:  L4-L5, L3 and L4 medial branches and L5-S1, L4 medial branch and L5 dorsal ramus  Needle: 18 ga.,  10mm active tip, RF Cannula  Needle Placement: Along juncture of superior articular process and transverse pocess  Findings:  -Comments:  Procedure Details: For each desired target nerve, the corresponding transverse process (sacral ala for the L5 dorsal rami) was identified and the fluoroscope was positioned to square off the endplates of the corresponding vertebral body to achieve a true AP midline view.  The beam was then obliqued 15 to 20 degrees and caudally tilted 15 to 20 degrees to line up a trajectory along the target nerves. The skin over the target of the junction of superior articulating process and transverse process (sacral ala for the L5 dorsal rami) was infiltrated with 1ml of 1% Lidocaine  without Epinephrine .  The 18 gauge  10mm active tip outer cannula was advanced in trajectory view to the target.  This procedure was repeated for each target nerve.  Then, for all levels, the outer cannula placement was fine-tuned and the position was then confirmed with bi-planar imaging.    Test stimulation was done both at sensory and motor levels to ensure there was no radicular stimulation. The target tissues were then  infiltrated with 1 ml of 1% Lidocaine  without Epinephrine . Subsequently, a percutaneous neurotomy was carried out for 90 seconds at 80 degrees Celsius.  After the completion of the lesion, 1 ml of injectate was delivered. It was then repeated for each facet joint nerve mentioned above. Appropriate radiographs were obtained to verify the probe placement during the neurotomy.   Additional Comments:  The patient tolerated the procedure well Dressing: 2 x 2 sterile gauze and Band-Aid    Post-procedure details: Patient was observed during the procedure. Post-procedure instructions were reviewed.  Patient left the clinic in stable condition.      Clinical History: Imaging: XRs of the lumbar spine from 04/11/2024 were independently reviewed and interpreted, showing lumbar degenerative scoliosis with apex to the left at L2/3.  Disc height loss at L2/3 and L3/4.  No spondylolisthesis seen.  No evidence of instability on flexion/extension views.  Suspect an old L2 compression fracture but no other fracture seen.  No dislocation seen.     Objective:  VS:  HT:    WT:   BMI:     BP:(!) 147/68  HR:84bpm  TEMP: ( )  RESP:  Physical Exam Vitals and nursing note reviewed.  Constitutional:      General: She is not in acute distress.    Appearance: Normal appearance. She is not ill-appearing.  HENT:     Head: Normocephalic and atraumatic.     Right Ear: External ear normal.     Left Ear: External ear normal.  Eyes:     Extraocular Movements: Extraocular movements intact.  Cardiovascular:     Rate and Rhythm: Normal rate.     Pulses: Normal pulses.  Pulmonary:     Effort: Pulmonary effort is normal. No respiratory distress.  Abdominal:     General: There is no distension.     Palpations: Abdomen is soft.  Musculoskeletal:        General: Tenderness present.     Cervical back: Neck supple.     Right lower leg: No edema.     Left lower leg: No edema.     Comments: Patient has good  distal strength with no pain over the greater trochanters.  No clonus or focal weakness.  Skin:    Findings: No erythema, lesion or rash.  Neurological:     General: No focal deficit present.     Mental Status: She is alert and oriented to person, place, and time.     Sensory: No sensory deficit.     Motor: No weakness or abnormal muscle tone.     Coordination: Coordination normal.  Psychiatric:        Mood and Affect: Mood normal.        Behavior: Behavior normal.      Imaging: XR C-ARM NO REPORT Result Date: 07/09/2024 Please see Notes tab for imaging impression.

## 2024-07-10 NOTE — Procedures (Signed)
 Lumbar Facet Joint Nerve Denervation  Patient: Rebecca Guzman      Date of Birth: Jun 14, 1934 MRN: [id number] PCP: Rollene Almarie LABOR, MD      Visit Date: 07/09/2024   Universal Protocol:    Date/Time: 12/03/256:03 AM  Consent Given By: the patient  Position: PRONE  Additional Comments: Vital signs were monitored before and after the procedure. Patient was prepped and draped in the usual sterile fashion. The correct patient, procedure, and site was verified.   Injection Procedure Details:   Procedure diagnoses:  1. Spondylosis without myelopathy or radiculopathy, lumbar region      Meds Administered:  Meds ordered this encounter  Medications   methylPREDNISolone  acetate (DEPO-MEDROL ) injection 40 mg     Laterality: Right  Location/Site:  L4-L5, L3 and L4 medial branches and L5-S1, L4 medial branch and L5 dorsal ramus  Needle: 18 ga.,  10mm active tip, RF Cannula  Needle Placement: Along juncture of superior articular process and transverse pocess  Findings:  -Comments:  Procedure Details: For each desired target nerve, the corresponding transverse process (sacral ala for the L5 dorsal rami) was identified and the fluoroscope was positioned to square off the endplates of the corresponding vertebral body to achieve a true AP midline view.  The beam was then obliqued 15 to 20 degrees and caudally tilted 15 to 20 degrees to line up a trajectory along the target nerves. The skin over the target of the junction of superior articulating process and transverse process (sacral ala for the L5 dorsal rami) was infiltrated with 1ml of 1% Lidocaine  without Epinephrine .  The 18 gauge 10mm active tip outer cannula was advanced in trajectory view to the target.  This procedure was repeated for each target nerve.  Then, for all levels, the outer cannula placement was fine-tuned and the position was then confirmed with bi-planar imaging.    Test stimulation was done both at  sensory and motor levels to ensure there was no radicular stimulation. The target tissues were then infiltrated with 1 ml of 1% Lidocaine  without Epinephrine . Subsequently, a percutaneous neurotomy was carried out for 90 seconds at 80 degrees Celsius.  After the completion of the lesion, 1 ml of injectate was delivered. It was then repeated for each facet joint nerve mentioned above. Appropriate radiographs were obtained to verify the probe placement during the neurotomy.   Additional Comments:  The patient tolerated the procedure well Dressing: 2 x 2 sterile gauze and Band-Aid    Post-procedure details: Patient was observed during the procedure. Post-procedure instructions were reviewed.  Patient left the clinic in stable condition.

## 2024-07-17 ENCOUNTER — Encounter: Admitting: Physical Medicine and Rehabilitation

## 2024-08-03 ENCOUNTER — Other Ambulatory Visit: Payer: Self-pay | Admitting: Cardiology

## 2024-08-03 DIAGNOSIS — E785 Hyperlipidemia, unspecified: Secondary | ICD-10-CM

## 2024-08-03 DIAGNOSIS — R002 Palpitations: Secondary | ICD-10-CM

## 2024-08-03 DIAGNOSIS — E782 Mixed hyperlipidemia: Secondary | ICD-10-CM

## 2024-08-03 DIAGNOSIS — I493 Ventricular premature depolarization: Secondary | ICD-10-CM

## 2024-08-21 NOTE — Progress Notes (Signed)
 " Cardiology Office Note:    Date:  08/22/2024   ID:  Rebecca Guzman, DOB Dec 21, 1933, MRN [id number]  PCP:  Rollene Almarie LABOR, MD   [facility] Providers Cardiologist:  Lonni LITTIE Nanas, MD {   Referring MD: Rollene Almarie LABOR, MD     History of Present Illness:    Rebecca Guzman is a 89 y.o. female with a hx of asthma, GERD, HLD and palpitations who was previously followed by Dr. Maranda and Dr. Hobart who now presents to clinic for follow-up.  Per review of the record, the patient has been followed by Dr. Maranda for palpitations. In 2018 she underwent Holter monitoring that showed frequent PACs (700 in 34 hrs) and few very short runs of atrial tachycardia with the longest lasting 4 beats. She was started on metoprolol  12.5 mg by mouth twice a day with significant improvement of symptoms. She also had a TTE that showed LVEF 60-65% and grade 1 diastolic dysfunction, trivial MR, normal left atrial size and mild TR. her metoprolol  was later increased to 25 mg p.o. twice daily with improvement of her symptoms.  The patient also had breast cancer 1992 s/p chemo and mastectomy but no radiation at that time. She was diagnosed with recurrent breast cancer 10/2017 which was very localized; she is in remission and is off tamoxifen .  No chemo or radiation at that time.  TTE with strain was obtained 01/2021 which showed LVEF 58%, G1DD, GLS -20.5%, normal RV, trivial MR, RAP 3.  Since last clinic visit, she reports she is doing okay.  Reports occasional palpitations, occurs about 1-2 times per week but just last for 1 to 2 seconds.  She denies any chest pain or dyspnea.  She reports some lightheadedness but denies any syncope.  No lower extremity edema.  She is doing physical therapy for her balance issues, has not been exercising due to back pain.  Past Medical History:  Diagnosis Date   Asthma    BPPV (benign paroxysmal positional vertigo)    Breast cancer (HCC)    left  mastectomy 1992, right lumpectomy 2019   Cancer (HCC)    Compression fracture of L3 lumbar vertebra 04/06/2015   Dysrhythmia 2019   pt states Cardiologist told her it is pre A-fib   Family history of adverse reaction to anesthesia    daughter had throat close up with anesthesia   Family history of breast cancer    Family history of colon cancer    Family history of pancreatic cancer    GERD (gastroesophageal reflux disease)    Hiatal hernia    pt states she doesnt have this   History of adenomatous polyp of colon    tubular adenoma 2005 and  2014 tubular adenoma and  hyperplastic polyp   History of breast cancer per pt no recurrence   dx 1992 --  s/p  left breast mastectomy and chemotherapy   Hyperlipidemia LDL goal <100    Osteoarthritis of right knee    Personal history of chemotherapy 1992   PONV (postoperative nausea and vomiting)    and gets weak and light-leaded   Sensation of pressure in bladder area    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   Vaginal vault prolapse    anterior   Wears glasses    Wears hearing aid    left only    Past Surgical History:  Procedure Laterality Date   BREAST BIOPSY Right 04/19/2018   BREAST BIOPSY Right 05/15/2023   MM  RT BREAST BX W LOC DEV 1ST LESION IMAGE BX SPEC STEREO GUIDE 05/15/2023 [facility]   BREAST LUMPECTOMY Right 05/17/2018   BREAST LUMPECTOMY WITH RADIOACTIVE SEED LOCALIZATION Right 05/17/2018   Procedure: RIGHT BREAST LUMPECTOMY WITH RADIOACTIVE SEED LOCALIZATION;  Surgeon: Aron Shoulders, MD;  Location: [facility];  Service: General;  Laterality: Right;   CATARACT EXTRACTION W/ INTRAOCULAR LENS  IMPLANT, BILATERAL  2014   CHOLECYSTECTOMY  1985   COLONOSCOPY  last one 06-04-2013   CYSTOCELE REPAIR N/A 02/29/2016   Procedure: ANTERIOR VAULT PROLAPSE REPAIR COLOPLAST SACROSPINUS FIXATION AUGMENTED AXIS DERMIS REPAIR  ;  Surgeon: Arlena Gal, MD;  Location: [facility];  Service: Urology;  Laterality: N/A;    ESOPHAGOGASTRODUODENOSCOPY  08-31-2009   LAPAROSCOPIC ASSISTED VAGINAL HYSTERECTOMY  01-10-2000   w/ Bilateral Salpingoophorectomy /  Anterior & Posterior repair/  Pubovaginal Sling   MASTECTOMY Left 1992   SIMPLE MASTECTOMY WITH AXILLARY SENTINEL NODE BIOPSY Right 06/14/2023   Procedure: RIGHT MASTECTOMY WITH MAGTRACE INJECTION;  Surgeon: Aron Shoulders, MD;  Location: [facility];  Service: General;  Laterality: Right;  PEC BLOCK   TOTAL KNEE ARTHROPLASTY Right 05/21/2021   Procedure: RIGHT TOTAL KNEE ARTHROPLASTY;  Surgeon: Barbarann Oneil BROCKS, MD;  Location: [facility] OR;  Service: Orthopedics;  Laterality: Right;    Current Medications: Current Meds  Medication Sig   acetaminophen  (TYLENOL ) 500 MG tablet Take 2 tablets (1,000 mg total) by mouth every 6 (six) hours.   Calcium  Carbonate-Vit D-Min (CALCIUM  600+D3 PLUS MINERALS) 600-800 MG-UNIT TABS Take 2 tablets by mouth daily.   denosumab  (PROLIA ) 60 MG/ML SOSY injection Inject 60 mg into the skin every 6 (six) months.   fexofenadine (ALLEGRA) 180 MG tablet Take 180 mg by mouth daily.   ipratropium (ATROVENT ) 0.03 % nasal spray Place into both nostrils.   levocetirizine (XYZAL) 5 MG tablet Take 5 mg by mouth daily. (Patient taking differently: Take 5 mg by mouth as needed.)   metoprolol  succinate (TOPROL  XL) 50 MG 24 hr tablet Take 1 tablet (50 mg total) by mouth daily. Take with or immediately following a meal.   Multiple Vitamin (MULTIVITAMIN) tablet Take 1 tablet by mouth daily.   Multiple Vitamins-Minerals (PRESERVISION AREDS 2 PO) Take 1 tablet by mouth daily.   nitrofurantoin, macrocrystal-monohydrate, (MACROBID) 100 MG capsule Take 100 mg by mouth at bedtime.   Omega-3 Fatty Acids (FISH OIL ) 1000 MG CAPS Take 1 capsule (1,000 mg total) by mouth daily.   omeprazole  (PRILOSEC) 40 MG capsule TAKE 1 CAPSULE(40 MG) BY MOUTH DAILY   Probiotic Product (PROBIOTIC FORMULA PO) Take 1 tablet by mouth every evening.    rosuvastatin   (CRESTOR ) 5 MG tablet TAKE 1 TABLET(5 MG) BY MOUTH 4 TIMES A WEEK   SYMBICORT 80-4.5 MCG/ACT inhaler Inhale into the lungs.   tamoxifen  (NOLVADEX ) 10 MG tablet Take 1 tablet (10 mg total) by mouth every other day.   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  Tobacco Use   Smoking status: Former    Current packs/day: 0.00    Types: Cigarettes    Start date: 10/12/1970    Quit date: 10/12/1983    Years since quitting: 40.8   Smokeless tobacco: Never  Vaping Use   Vaping status: Never Used  Substance and Sexual Activity   Alcohol use: No   Drug use: No   Sexual activity: Not on file  Other Topics Concern   Not on file  Social History Narrative   Son and daughter lives with her/2025   Social Drivers of Health   Tobacco Use: Medium Risk (08/22/2024)   Patient History    Smoking Tobacco Use: Former    Smokeless Tobacco Use: Never    Passive Exposure: Not on Actuary Strain: Low Risk (01/04/2024)   Overall Financial Resource Strain (CARDIA)    Difficulty of Paying Living Expenses: Not hard at all  Food Insecurity: No Food Insecurity (01/04/2024)   Hunger Vital Sign    Worried About Running Out of Food in the Last Year: Never true    Ran Out of Food in the Last Year: Never true  Transportation Needs: No Transportation Needs (01/04/2024)   PRAPARE - Administrator, Civil Service  (Medical): No    Lack of Transportation (Non-Medical): No  Physical Activity: Inactive (01/04/2024)   Exercise Vital Sign    Days of Exercise per Week: 0 days    Minutes of Exercise per Session: 0 min  Stress: No Stress Concern Present (01/04/2024)   [employer] of Occupational Health - Occupational Stress Questionnaire    Feeling of Stress : Not at all  Social Connections: Moderately Integrated (01/04/2024)   Social Connection and Isolation Panel    Frequency of Communication with Friends and Family: More than three times a week    Frequency of Social Gatherings with Friends and Family: More than three times a week    Attends Religious Services: More than 4 times per year    Active Member of [employer] or Organizations: Yes    Attends Banker Meetings: Never    Marital Status: Widowed  Depression (PHQ2-9): Low Risk (01/04/2024)   Depression (PHQ2-9)    PHQ-2 Score: 0  Alcohol Screen: Low Risk (01/04/2024)   Alcohol Screen    Last Alcohol Screening Score (AUDIT): 0  Housing: Unknown (01/15/2024)   Received from [facility]   Epic    Unable to Pay for Housing in the Last Year: Not on file    Number of Times Moved in the Last Year: Not on file    At any time in the past 12 months, were you homeless or living in a shelter (including now)?: No  Utilities: Not At Risk (01/04/2024)   [facility] Utilities    Threatened with loss of utilities: No  Health Literacy: Adequate Health Literacy (01/04/2024)   B1300 Health Literacy    Frequency of need for help with medical instructions: Never     Family History: The patient's family history includes Breast cancer in her sister; Colon cancer (age of onset: 53) in her mother; Heart attack in her mother; Heart disease in her father; Hypertension in her mother; Pancreatic cancer (age of onset: 18) in an other family member.  ROS:   Please see the history of present illness.      EKGs/Labs/Other Studies Reviewed:    The  following  studies were reviewed today: Cardiac Studies & Procedures   ______________________________________________________________________________________________     ECHOCARDIOGRAM  ECHOCARDIOGRAM COMPLETE 01/20/2021  Narrative ECHOCARDIOGRAM REPORT    Patient Name:   Rebecca Guzman Date of Exam: 01/20/2021 Medical Rec #:  [id number]       Height:       65.0 in Accession #:    [id number]      Weight:       171.8 lb Date of Birth:  02/27/1934       BSA:          1.854 m Patient Age:    87 years        BP:           126/76 mmHg Patient Gender: F               HR:           73 bpm. Exam Location:  [address]  Procedure: 2D Echo, 3D Echo, Cardiac Doppler, Color Doppler and Strain Analysis  Indications:    Z92.21 H/o Chemotherapy  History:        Patient has prior history of Echocardiogram examinations, most recent 11/15/2016. Arrythmias:Palpitation; Risk Factors:Dyslipidemia and Former Smoker. H/o breast cancer.  Sonographer:    Marshia Rea RAMAN, RDCS Referring Phys: [id number] [place]ELL [place]  IMPRESSIONS   1. Left ventricular ejection fraction, by estimation, is 55 to 60%. Left ventricular ejection fraction by 3D volume is 58 %. The left ventricle has normal function. The left ventricle has no regional wall motion abnormalities. Left ventricular diastolic parameters are consistent with Grade I diastolic dysfunction (impaired relaxation). Elevated left ventricular end-diastolic pressure. The E/e' is 47. The average left ventricular global longitudinal strain is -20.5 %. The global longitudinal strain is normal. 2. Right ventricular systolic function is normal. The right ventricular size is normal. There is normal pulmonary artery systolic pressure. The estimated right ventricular systolic pressure is 33.5 mmHg. 3. The mitral valve is degenerative. Trivial mitral valve regurgitation. 4. The aortic valve is tricuspid. Aortic valve regurgitation is not visualized. Mild aortic  valve sclerosis is present, with no evidence of aortic valve stenosis. 5. The inferior vena cava is normal in size with greater than 50% respiratory variability, suggesting right atrial pressure of 3 mmHg.  Comparison(s): 11/15/16: LVEF 60-65%, grade 1 DD, elevated LVEDP, RVSP 37 mmHg.  FINDINGS Left Ventricle: Left ventricular ejection fraction, by estimation, is 55 to 60%. Left ventricular ejection fraction by 3D volume is 58 %. The left ventricle has normal function. The left ventricle has no regional wall motion abnormalities. The average left ventricular global longitudinal strain is -20.5 %. The global longitudinal strain is normal. The left ventricular internal cavity size was normal in size. There is no left ventricular hypertrophy. Left ventricular diastolic parameters are consistent with Grade I diastolic dysfunction (impaired relaxation). Elevated left ventricular end-diastolic pressure. The E/e' is 34.  Right Ventricle: The right ventricular size is normal. No increase in right ventricular wall thickness. Right ventricular systolic function is normal. There is normal pulmonary artery systolic pressure. The tricuspid regurgitant velocity is 2.76 m/s, and with an assumed right atrial pressure of 3 mmHg, the estimated right ventricular systolic pressure is 33.5 mmHg.  Left Atrium: Left atrial size was normal in size.  Right Atrium: Right atrial size was normal in size.  Pericardium: There is no evidence of pericardial effusion.  Mitral Valve: The mitral valve is degenerative in appearance. There is mild thickening of the mitral valve  leaflet(s). Trivial mitral valve regurgitation.  Tricuspid Valve: The tricuspid valve is grossly normal. Tricuspid valve regurgitation is trivial.  Aortic Valve: The aortic valve is tricuspid. Aortic valve regurgitation is not visualized. Mild aortic valve sclerosis is present, with no evidence of aortic valve stenosis.  Pulmonic Valve: The pulmonic  valve was normal in structure. Pulmonic valve regurgitation is not visualized.  Aorta: The aortic root and ascending aorta are structurally normal, with no evidence of dilitation.  Venous: The inferior vena cava is normal in size with greater than 50% respiratory variability, suggesting right atrial pressure of 3 mmHg.  IAS/Shunts: No atrial level shunt detected by color flow Doppler.   LEFT VENTRICLE PLAX 2D LVIDd:         4.10 cm         Diastology LVIDs:         2.90 cm         LV e' medial:    4.00 cm/s LV PW:         1.00 cm         LV E/e' medial:  22.5 LV IVS:        1.00 cm         LV e' lateral:   7.20 cm/s LVOT diam:     2.00 cm         LV E/e' lateral: 12.5 LV SV:         61 LV SV Index:   33              2D LVOT Area:     3.14 cm        Longitudinal Strain 2D Strain GLS  -20.0 % (A2C): 2D Strain GLS  -23.2 % (A3C): 2D Strain GLS  -18.2 % (A4C): 2D Strain GLS  -20.5 % Avg:  3D Volume EF LV 3D EF:    Left ventricular ejection fraction by 3D volume is 58 %.  3D Volume EF: 3D EF:        58 % LV EDV:       99 ml LV ESV:       41 ml LV SV:        58 ml  RIGHT VENTRICLE RV Basal diam:  3.40 cm RV S prime:     8.50 cm/s TAPSE (M-mode): 1.6 cm RVSP:           33.5 mmHg  LEFT ATRIUM             Index       RIGHT ATRIUM           Index LA diam:        3.60 cm 1.94 cm/m  RA Pressure: 3.00 mmHg LA Vol (A2C):   37.0 ml 19.95 ml/m RA Area:     9.52 cm LA Vol (A4C):   46.3 ml 24.97 ml/m RA Volume:   17.30 ml  9.33 ml/m LA Biplane Vol: 42.7 ml 23.03 ml/m AORTIC VALVE LVOT Vmax:   86.20 cm/s LVOT Vmean:  57.700 cm/s LVOT VTI:    0.195 m  AORTA Ao Root diam: 3.40 cm Ao Asc diam:  3.60 cm  MITRAL VALVE               TRICUSPID VALVE TR Peak grad:   30.5 mmHg TR Vmax:        276.00 cm/s MV E velocity: 90.00 cm/s  Estimated RAP:  3.00 mmHg MV A velocity: 93.00 cm/s  RVSP:  33.5 mmHg MV E/A ratio:  0.97 SHUNTS Systemic VTI:  0.20 m Systemic  Diam: 2.00 cm  Vinie Maxcy MD Electronically signed by Vinie Maxcy MD Signature Date/Time: 01/20/2021/11:07:05 AM    Final          ______________________________________________________________________________________________       EKG:   08/22/2024: Normal sinus rhythm, rate 79, no ST abnormalities  Recent Labs: 09/25/2023: ALT 16; BUN 22; Creatinine, Ser 0.74; Hemoglobin 13.4; Platelets 212.0; Potassium 3.9; Sodium 140  Recent Lipid Panel    Component Value Date/Time   CHOL 158 09/25/2023 1037   CHOL 187 01/26/2016 0802   TRIG 178.0 (H) 09/25/2023 1037   HDL 62.50 09/25/2023 1037   HDL 59 01/26/2016 0802   CHOLHDL 3 09/25/2023 1037   VLDL 35.6 09/25/2023 1037   LDLCALC 60 09/25/2023 1037   LDLCALC 76 05/08/2020 0830   LDLDIRECT 78.0 03/29/2021 1441     Physical Exam:    VS:  BP 130/70 (BP Location: Left Arm, Patient Position: Sitting, Cuff Size: Normal)   Pulse 79   Ht 5' 5 (1.651 m)   Wt 163 lb 9.6 oz (74.2 kg)   LMP 11/21/1985   SpO2 98%   BMI 27.22 kg/m     Wt Readings from Last 3 Encounters:  08/22/24 163 lb 9.6 oz (74.2 kg)  04/11/24 166 lb (75.3 kg)  03/04/24 165 lb 6.4 oz (75 kg)     GEN:  Well nourished, well developed in no acute distress HEENT: Normal NECK: No JVD CARDIAC: RRR, 1/6 systolic murmur. No rubs or gallops. RESPIRATORY:  CTAB ABDOMEN: Soft, non-tender, non-distended MUSCULOSKELETAL:  No edema; No deformity  SKIN: Warm and dry NEUROLOGIC:  Alert and oriented x 3 PSYCHIATRIC:  Normal affect   ASSESSMENT:    1. Palpitations   2. PVC's (premature ventricular contractions)   3. Essential hypertension   4. Hyperlipidemia, unspecified hyperlipidemia type      PLAN:    In order of problems listed above:  #Palpitations: #PACs: Holter monitoring in 2018 that showed frequent PACs (700 in 34 hrs) and few very short runs of atrial tachycardia with the longest lasting 4 beats. TTE with LVEF 60-65%, grade 1 diastolic  dysfunction, trivial MR, normal left atrial size and mild TR. Improved on metop. -Continue metoprolol , will consolidate to Toprol -XL 50 mg daily  #SOB: -Suspect this is related to allergies as symptoms improves with allegra and nasal spray.  Reports had symptoms in the spring but resolved  #HTN: Well controlled and at goal. -Continue metoprolol   #HLD: -LDL well controlled at 60 on 09/2023 -Tolerating crestor  5mg  4x/week  #Recurrent breast cancer: Had initial breast cancer in 1990s s/p mastectomy and chemo. No XRT. Had recurrent breast cancer in 2019. No s/p Right lumpectomy: IDC, 5.5 mm, grade 1, margins negative, ER 100%, PR 100%, HER-2 1+ by IHC, Ki-67 less than 1%, T1BNX stage Ia. Previously on tamoxifen , now off and in remission. -Management per Oncology  RTC in 1 year    Medication Adjustments/Labs and Tests Ordered: Current medicines are reviewed at length with the patient today.  Concerns regarding medicines are outlined above.  Orders Placed This Encounter  Procedures   EKG 12-Lead   Meds ordered this encounter  Medications   metoprolol  succinate (TOPROL  XL) 50 MG 24 hr tablet    Sig: Take 1 tablet (50 mg total) by mouth daily. Take with or immediately following a meal.    Dispense:  90 tablet    Refill:  3  Patient Instructions  Medication Instructions:  Your physician has recommended you make the following change in your medication:  STOP: Lopresor START: Toprol -XL (metoprolol  succinate) 50 mg once daily  *If you need a refill on your cardiac medications before your next appointment, please call your pharmacy* Follow-Up: At [facility], you and your health needs are our priority.  As part of our continuing mission to provide you with exceptional heart care, our providers are all part of one team.  This team includes your primary Cardiologist (physician) and Advanced Practice Providers or APPs (Physician Assistants and Nurse Practitioners) who all work  together to provide you with the care you need, when you need it.  Your next appointment:   1 year(s)  Provider:   Lonni LITTIE Nanas, MD     Other Instructions:              Signed, Lonni LITTIE Nanas, MD  08/22/2024 4:02 PM    [facility] "

## 2024-08-22 ENCOUNTER — Ambulatory Visit: Attending: Cardiology | Admitting: Cardiology

## 2024-08-22 ENCOUNTER — Encounter: Payer: Self-pay | Admitting: Cardiology

## 2024-08-22 VITALS — BP 130/70 | HR 79 | Ht 65.0 in | Wt 163.6 lb

## 2024-08-22 DIAGNOSIS — I10 Essential (primary) hypertension: Secondary | ICD-10-CM | POA: Diagnosis not present

## 2024-08-22 DIAGNOSIS — R002 Palpitations: Secondary | ICD-10-CM | POA: Diagnosis not present

## 2024-08-22 DIAGNOSIS — E785 Hyperlipidemia, unspecified: Secondary | ICD-10-CM

## 2024-08-22 DIAGNOSIS — I493 Ventricular premature depolarization: Secondary | ICD-10-CM

## 2024-08-22 MED ORDER — METOPROLOL SUCCINATE ER 50 MG PO TB24: 50.0000 mg | ORAL_TABLET | Freq: Every day | ORAL | 3 refills | Status: AC

## 2024-08-22 NOTE — Patient Instructions (Addendum)
 Medication Instructions:  Your physician has recommended you make the following change in your medication:  STOP: Lopresor START: Toprol -XL (metoprolol  succinate) 50 mg once daily  *If you need a refill on your cardiac medications before your next appointment, please call your pharmacy* Follow-Up: At [facility], you and your health needs are our priority.  As part of our continuing mission to provide you with exceptional heart care, our providers are all part of one team.  This team includes your primary Cardiologist (physician) and Advanced Practice Providers or APPs (Physician Assistants and Nurse Practitioners) who all work together to provide you with the care you need, when you need it.  Your next appointment:   1 year(s)  Provider:   Lonni LITTIE Nanas, MD     Other Instructions:

## 2024-09-10 ENCOUNTER — Telehealth: Payer: Self-pay | Admitting: Orthopedic Surgery

## 2024-09-11 ENCOUNTER — Other Ambulatory Visit: Payer: Self-pay

## 2024-09-11 ENCOUNTER — Ambulatory Visit: Admitting: Orthopedic Surgery

## 2024-09-11 DIAGNOSIS — M25552 Pain in left hip: Secondary | ICD-10-CM

## 2024-09-11 MED ORDER — TRAMADOL HCL 50 MG PO TABS: 50.0000 mg | ORAL_TABLET | Freq: Four times a day (QID) | ORAL | 0 refills | Status: AC | PRN

## 2024-09-11 NOTE — Progress Notes (Signed)
 Orthopedic Surgery Progress Note   Assessment: Patient is a 89 y.o. female who presents with left groin pain that is worse with weightbearing, seems consistent with hip as etiology   Plan: -No operative plans at this time -Weight bearing as tolerated -Can continue with her home exercise program -Recommended a diagnostic/therapeutic left hip injection.  Referral provided to her today -Prescribed tramadol  for additional pain relief.  She can use this in addition to Tylenol  but she is already taking -Return to office on an as needed basis  ___________________________________________________________________________  Subjective: Patient noted acute onset of left groin pain about 3 weeks ago.  She said she noticed it after lifting a bunch of groceries.  She notes the pain when she is weightbearing.  She does not have as much pain at rest.  She feels it in the groin and in the buttock.  More of the pain is in the groin.  She has had pain like this before when she was seeing Dr. Barbarann.  She said that Dr. Barbarann gave her some tramadol  and then the pain resolved within a week or 2.   Physical Exam:  General: no acute distress, appears stated age Neurologic: alert, answering questions appropriately, following commands Respiratory: unlabored breathing on room air, symmetric chest rise Psychiatric: appropriate affect, normal cadence to speech  MSK:   -Left lower extremity  No pain through passive range of motion at the hip, positive FADIR, pain with FABER, negative Stinchfield EHL/TA/GSC intact Plantarflexes and dorsiflexes toes Sensation intact to light touch in sural, saphenous, tibial, deep peroneal, and superficial peroneal nerve distributions Foot warm and well perfused   Imaging: XRs of the left hip from 09/11/2024 were independently reviewed and interpreted, showing no fracture or dislocation.  There is slight amount of joint space narrowing within the hip joint.  No other significant  degenerative changes seen within the hip joint.   Patient name: Rebecca Guzman Patient MRN: [id number] Date: 09/11/24

## 2024-09-11 NOTE — Telephone Encounter (Signed)
 Patient is coming in @ 2 on 09/11/24

## 2024-09-24 ENCOUNTER — Encounter: Admitting: Sports Medicine

## 2025-01-09 ENCOUNTER — Ambulatory Visit

## 2025-03-04 ENCOUNTER — Ambulatory Visit: Admitting: Hematology and Oncology
